# Patient Record
Sex: Female | Born: 1960
Health system: Southern US, Community
[De-identification: ages and names within clinical notes are randomized; demographics above are authoritative.]

## PROBLEM LIST (undated history)

## (undated) VITALS — BP 122/55 | HR 93 | Temp 97.8°F | Resp 16 | Ht 59.0 in | Wt 121.0 lb

## (undated) VITALS — BP 128/85 | HR 78 | Temp 97.6°F | Resp 18 | Ht 60.0 in | Wt 127.0 lb

## (undated) DIAGNOSIS — F1910 Other psychoactive substance abuse, uncomplicated: Secondary | ICD-10-CM

## (undated) DIAGNOSIS — F32A Depression, unspecified: Secondary | ICD-10-CM

## (undated) DIAGNOSIS — R519 Headache, unspecified: Secondary | ICD-10-CM

## (undated) DIAGNOSIS — R51 Headache: Secondary | ICD-10-CM

## (undated) DIAGNOSIS — F319 Bipolar disorder, unspecified: Secondary | ICD-10-CM

## (undated) DIAGNOSIS — F419 Anxiety disorder, unspecified: Secondary | ICD-10-CM

## (undated) DIAGNOSIS — F329 Major depressive disorder, single episode, unspecified: Secondary | ICD-10-CM

## (undated) DIAGNOSIS — F209 Schizophrenia, unspecified: Secondary | ICD-10-CM

## (undated) HISTORY — PX: NO PAST SURGERIES: SHX2092

---

## 2005-07-03 ENCOUNTER — Emergency Department (HOSPITAL_COMMUNITY): Admission: EM | Admit: 2005-07-03 | Discharge: 2005-07-03 | Payer: Self-pay | Admitting: Emergency Medicine

## 2006-01-17 ENCOUNTER — Emergency Department (HOSPITAL_COMMUNITY): Admission: EM | Admit: 2006-01-17 | Discharge: 2006-01-17 | Payer: Self-pay | Admitting: Emergency Medicine

## 2006-04-23 ENCOUNTER — Emergency Department (HOSPITAL_COMMUNITY): Admission: EM | Admit: 2006-04-23 | Discharge: 2006-04-23 | Payer: Self-pay | Admitting: Emergency Medicine

## 2006-07-13 ENCOUNTER — Inpatient Hospital Stay (HOSPITAL_COMMUNITY): Admission: AD | Admit: 2006-07-13 | Discharge: 2006-07-23 | Payer: Self-pay | Admitting: Psychiatry

## 2006-07-14 ENCOUNTER — Ambulatory Visit: Payer: Self-pay | Admitting: Psychiatry

## 2006-07-15 ENCOUNTER — Emergency Department (HOSPITAL_COMMUNITY): Admission: EM | Admit: 2006-07-15 | Discharge: 2006-07-15 | Payer: Self-pay | Admitting: Emergency Medicine

## 2006-07-25 ENCOUNTER — Emergency Department (HOSPITAL_COMMUNITY): Admission: EM | Admit: 2006-07-25 | Discharge: 2006-07-26 | Payer: Self-pay | Admitting: Emergency Medicine

## 2006-07-26 ENCOUNTER — Inpatient Hospital Stay (HOSPITAL_COMMUNITY): Admission: EM | Admit: 2006-07-26 | Discharge: 2006-07-29 | Payer: Self-pay | Admitting: *Deleted

## 2006-09-14 ENCOUNTER — Encounter: Payer: Self-pay | Admitting: Emergency Medicine

## 2006-09-14 ENCOUNTER — Ambulatory Visit: Payer: Self-pay | Admitting: Psychiatry

## 2006-09-14 ENCOUNTER — Inpatient Hospital Stay (HOSPITAL_COMMUNITY): Admission: EM | Admit: 2006-09-14 | Discharge: 2006-09-19 | Payer: Self-pay | Admitting: Psychiatry

## 2006-09-24 ENCOUNTER — Emergency Department (HOSPITAL_COMMUNITY): Admission: EM | Admit: 2006-09-24 | Discharge: 2006-09-24 | Payer: Self-pay | Admitting: Emergency Medicine

## 2006-12-10 ENCOUNTER — Ambulatory Visit: Payer: Self-pay | Admitting: Psychiatry

## 2006-12-10 ENCOUNTER — Inpatient Hospital Stay (HOSPITAL_COMMUNITY): Admission: AD | Admit: 2006-12-10 | Discharge: 2006-12-14 | Payer: Self-pay | Admitting: Psychiatry

## 2007-01-11 ENCOUNTER — Inpatient Hospital Stay (HOSPITAL_COMMUNITY): Admission: AD | Admit: 2007-01-11 | Discharge: 2007-01-17 | Payer: Self-pay | Admitting: Psychiatry

## 2007-03-16 ENCOUNTER — Emergency Department (HOSPITAL_COMMUNITY): Admission: EM | Admit: 2007-03-16 | Discharge: 2007-03-17 | Payer: Self-pay | Admitting: Emergency Medicine

## 2007-03-16 ENCOUNTER — Emergency Department (HOSPITAL_COMMUNITY): Admission: EM | Admit: 2007-03-16 | Discharge: 2007-03-16 | Payer: Self-pay | Admitting: Emergency Medicine

## 2007-05-10 ENCOUNTER — Inpatient Hospital Stay (HOSPITAL_COMMUNITY): Admission: AD | Admit: 2007-05-10 | Discharge: 2007-05-17 | Payer: Self-pay | Admitting: Psychiatry

## 2007-05-10 ENCOUNTER — Ambulatory Visit: Payer: Self-pay | Admitting: Psychiatry

## 2007-11-12 ENCOUNTER — Inpatient Hospital Stay (HOSPITAL_COMMUNITY): Admission: RE | Admit: 2007-11-12 | Discharge: 2007-11-16 | Payer: Self-pay | Admitting: Psychiatry

## 2007-11-12 ENCOUNTER — Ambulatory Visit: Payer: Self-pay | Admitting: Psychiatry

## 2008-02-27 ENCOUNTER — Emergency Department (HOSPITAL_COMMUNITY): Admission: EM | Admit: 2008-02-27 | Discharge: 2008-02-28 | Payer: Self-pay | Admitting: Emergency Medicine

## 2008-07-08 ENCOUNTER — Ambulatory Visit: Payer: Self-pay | Admitting: Psychiatry

## 2008-07-08 ENCOUNTER — Inpatient Hospital Stay (HOSPITAL_COMMUNITY): Admission: RE | Admit: 2008-07-08 | Discharge: 2008-07-15 | Payer: Self-pay | Admitting: Psychiatry

## 2008-07-10 ENCOUNTER — Ambulatory Visit: Payer: Self-pay | Admitting: Vascular Surgery

## 2008-07-10 ENCOUNTER — Encounter (HOSPITAL_COMMUNITY): Payer: Self-pay | Admitting: Psychiatry

## 2008-09-07 ENCOUNTER — Inpatient Hospital Stay (HOSPITAL_COMMUNITY): Admission: AD | Admit: 2008-09-07 | Discharge: 2008-09-10 | Payer: Self-pay | Admitting: Psychiatry

## 2008-09-07 ENCOUNTER — Ambulatory Visit: Payer: Self-pay | Admitting: Psychiatry

## 2008-12-06 ENCOUNTER — Other Ambulatory Visit: Payer: Self-pay | Admitting: Emergency Medicine

## 2008-12-06 ENCOUNTER — Inpatient Hospital Stay (HOSPITAL_COMMUNITY): Admission: AD | Admit: 2008-12-06 | Discharge: 2008-12-14 | Payer: Self-pay | Admitting: *Deleted

## 2008-12-09 ENCOUNTER — Ambulatory Visit: Payer: Self-pay | Admitting: *Deleted

## 2009-01-05 ENCOUNTER — Other Ambulatory Visit: Payer: Self-pay | Admitting: Emergency Medicine

## 2009-01-05 ENCOUNTER — Other Ambulatory Visit: Payer: Self-pay

## 2009-01-06 ENCOUNTER — Inpatient Hospital Stay (HOSPITAL_COMMUNITY): Admission: EM | Admit: 2009-01-06 | Discharge: 2009-01-14 | Payer: Self-pay | Admitting: Psychiatry

## 2009-05-22 ENCOUNTER — Emergency Department (HOSPITAL_COMMUNITY): Admission: EM | Admit: 2009-05-22 | Discharge: 2009-05-22 | Payer: Self-pay | Admitting: Emergency Medicine

## 2009-05-25 ENCOUNTER — Inpatient Hospital Stay (HOSPITAL_COMMUNITY): Admission: RE | Admit: 2009-05-25 | Discharge: 2009-05-28 | Payer: Self-pay | Admitting: *Deleted

## 2009-05-25 ENCOUNTER — Ambulatory Visit: Payer: Self-pay | Admitting: *Deleted

## 2011-04-03 LAB — COMPREHENSIVE METABOLIC PANEL
ALT: 17 U/L (ref 0–35)
AST: 14 U/L (ref 0–37)
CO2: 28 mEq/L (ref 19–32)
Calcium: 8.8 mg/dL (ref 8.4–10.5)
GFR calc Af Amer: >60 mL/min (ref >60)
Potassium: 3.9 mEq/L (ref 3.5–5.1)
Sodium: 142 mEq/L (ref 135–145)
Total Protein: 5.5 g/dL — ABNORMAL LOW (ref 6.0–8.3)

## 2011-04-03 LAB — RPR: RPR Ser Ql: NONREACTIVE

## 2011-04-04 LAB — TRICYCLICS SCREEN, URINE: TCA Scrn: NOT DETECTED

## 2011-04-04 LAB — RAPID URINE DRUG SCREEN, HOSP PERFORMED
Benzodiazepines: NOT DETECTED
Cocaine: POSITIVE — AB
Tetrahydrocannabinol: POSITIVE — AB

## 2011-04-04 LAB — CBC
MCHC: 32.5 g/dL (ref 30.0–36.0)
RDW: 15.5 % (ref 11.5–15.5)

## 2011-04-04 LAB — BASIC METABOLIC PANEL
BUN: 6 mg/dL (ref 6–23)
CO2: 29 mEq/L (ref 19–32)
Calcium: 8.9 mg/dL (ref 8.4–10.5)
Creatinine, Ser: 0.9 mg/dL (ref 0.4–1.2)
Glucose, Bld: 84 mg/dL (ref 70–99)

## 2011-04-04 LAB — DIFFERENTIAL
Basophils Absolute: 0 10*3/uL (ref 0.0–0.1)
Basophils Relative: 1 % (ref 0–1)
Monocytes Absolute: 0.3 10*3/uL (ref 0.1–1.0)
Neutro Abs: 2.6 10*3/uL (ref 1.7–7.7)
Neutrophils Relative %: 57 % (ref 43–77)

## 2011-04-10 LAB — URINE CULTURE: Colony Count: >=100000

## 2011-04-10 LAB — RAPID URINE DRUG SCREEN, HOSP PERFORMED
Amphetamines: NOT DETECTED
Barbiturates: NOT DETECTED
Benzodiazepines: NOT DETECTED
Cocaine: POSITIVE — AB
Opiates: NOT DETECTED
Tetrahydrocannabinol: NOT DETECTED

## 2011-04-10 LAB — CBC
HCT: 44.8 % (ref 36.0–46.0)
Hemoglobin: 14.8 g/dL (ref 12.0–15.0)
MCHC: 33.1 g/dL (ref 30.0–36.0)
MCV: 92 fL (ref 78.0–100.0)
Platelets: 245 10*3/uL (ref 150–400)
RBC: 4.87 MIL/uL (ref 3.87–5.11)
RDW: 15.4 % (ref 11.5–15.5)
WBC: 8.5 10*3/uL (ref 4.0–10.5)

## 2011-04-10 LAB — DIFFERENTIAL
Basophils Absolute: 0 10*3/uL (ref 0.0–0.1)
Basophils Relative: 0 % (ref 0–1)
Eosinophils Absolute: 0 10*3/uL (ref 0.0–0.7)
Eosinophils Relative: 0 % (ref 0–5)
Lymphocytes Relative: 25 % (ref 12–46)
Lymphs Abs: 2.1 10*3/uL (ref 0.7–4.0)
Monocytes Absolute: 0.4 10*3/uL (ref 0.1–1.0)
Neutro Abs: 5.9 10*3/uL (ref 1.7–7.7)
Neutrophils Relative %: 70 % (ref 43–77)

## 2011-04-10 LAB — COMPREHENSIVE METABOLIC PANEL
ALT: 20 U/L (ref 0–35)
AST: 30 U/L (ref 0–37)
Albumin: 4.3 g/dL (ref 3.5–5.2)
Alkaline Phosphatase: 53 U/L (ref 39–117)
BUN: 12 mg/dL (ref 6–23)
CO2: 24 mEq/L (ref 19–32)
Calcium: 9 mg/dL (ref 8.4–10.5)
Chloride: 104 mEq/L (ref 96–112)
Creatinine, Ser: 0.88 mg/dL (ref 0.4–1.2)
GFR calc Af Amer: >60 mL/min (ref >60)
GFR calc non Af Amer: >60 mL/min (ref >60)
Glucose, Bld: 88 mg/dL (ref 70–99)
Potassium: 3.7 mEq/L (ref 3.5–5.1)
Sodium: 136 mEq/L (ref 135–145)
Total Bilirubin: 1 mg/dL (ref 0.3–1.2)
Total Protein: 7.6 g/dL (ref 6.0–8.3)

## 2011-04-10 LAB — URINALYSIS, ROUTINE W REFLEX MICROSCOPIC
Bilirubin Urine: NEGATIVE
Glucose, UA: NEGATIVE mg/dL
Ketones, ur: 40 mg/dL — AB
Leukocytes, UA: NEGATIVE
Nitrite: POSITIVE — AB
Protein, ur: 30 mg/dL — AB
Specific Gravity, Urine: 1.019 (ref 1.005–1.030)
Urobilinogen, UA: 0.2 mg/dL (ref 0.0–1.0)
pH: 6 (ref 5.0–8.0)

## 2011-04-10 LAB — SALICYLATE LEVEL: Salicylate Lvl: <4 mg/dL (ref 2.8–20.0)

## 2011-04-10 LAB — URINE MICROSCOPIC-ADD ON

## 2011-04-10 LAB — TRICYCLICS SCREEN, URINE: TCA Scrn: NOT DETECTED

## 2011-04-10 LAB — POCT PREGNANCY, URINE: Preg Test, Ur: NEGATIVE
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
## 2011-05-09 NOTE — H&P (Signed)
[name], [name]            ACCOUNT NO.:  [id number]   MEDICAL RECORD NO.:  [id number]          PATIENT TYPE:  IPS   LOCATION:  0300                          FACILITY:  [facility]   PHYSICIAN:  Jasmine Pang, M.D. DATE OF BIRTH:  12-05-1961   DATE OF ADMISSION:  12/06/2008  DATE OF DISCHARGE:                       PSYCHIATRIC ADMISSION ASSESSMENT   PATIENT IDENTIFICATION:  This is a 50 year old female voluntarily  admitted December 06, 2008.   HISTORY OF PRESENT ILLNESS:  The patient reports auditory hallucinations  telling her to kill herself, having a plan to overdose.  She has been  noncompliant with her medications and follow-up.  She states the Crisis  Team had come out to her home, assessed her situation and felt she  should be assessed for possible hospitalization.  She has been drinking  alcohol on occasion and using cocaine, has had poor sleep and has lost  some weight.   PAST PSYCHIATRIC HISTORY:  The patient was here in September.  Has a  history bipolar disorder type 2.  I a client at [facility] mental  health.   SOCIAL HISTORY:  This is a 50 year old female who considers herself  homeless, has been living in [place] for 3 years.  Moved from [place].   FAMILY HISTORY:  Aunt who died in a psychiatric facility and a daughter  who is currently bipolar.   ALCOHOL AND DRUG HABITS:  Has been drinking and using cocaine.   PRIMARY CARE Williams Dietrick:  None.   MEDICAL PROBLEMS:  Denies any acute or chronic health issues.  Denies  any surgical history.   MEDICATIONS:  Has been on Seroquel 100 at bedtime and Depakote 500  b.i.d.   DRUG ALLERGIES:  No known allergies.   PHYSICAL EXAMINATION:  GENERAL:  This is a short statured middle-aged  female who was fully assessed at [facility] emergency department.  Physical exam was reviewed with no significant findings.  VITAL SIGNS:  Temperature 97, 971 heart rate 20 respirations, blood  pressure is 110/66, 5 feet  4 inches tall, 118 pounds.   LABORATORY DATA:  Urine drug screen positive for cocaine.  CBC within  normal limits.  Alcohol level less than five, glucose of 122.   MENTAL STATUS EXAM:  She is in the bed, cooperative, although she seems  somewhat restless, rolling back and forth.  Feels that she needs her  medication to feel better again.  Her speech is clear, polite.  Mood is  anxious.  The patient again is restless, but cooperative and does not  appear to be actively responding to internal stimuli.  Thought process.  She is endorsing auditory hallucinations.  Promises safety.  Denies any  current suicidal or homicidal thoughts.  No delusional statements.  Her  answers are coherent and goal directed.  Cognitive function intact.  Her  memory appears to be good.  Judgment is fair.   DIAGNOSES:  AXIS I:  His mood disorder, polysubstance abuse.  AXIS II:  Deferred.  AXIS III:  No known medical conditions.  AXIS IV:  Some problems with housing.  Psychosocial problems related to  burden  of illness and chronic substance use.  AXIS V:  Current is 30.   PLAN:  Contract for safety.  Will have Librium available on a p.r.n.  basis.  Will resume her Seroquel and Depakote.  Reinforce medication  compliance and follow-up visits.  Will continue to identify her support  group.  Case manager will assess her living situation and follow-up.  Her tentative length of stay at this time is 3-5 days.      Landry Corporal, N.P.      Jasmine Pang, M.D.  Electronically Signed    JO/MEDQ  D:  12/07/2008  T:  12/07/2008  Job:  [id number]
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
## 2011-05-09 NOTE — Consult Note (Signed)
[name], BEG NO.:  [id number]   MEDICAL RECORD NO.:  [id number]          PATIENT TYPE:  EMS   LOCATION:  MAJO                         FACILITY:  [facility]   PHYSICIAN:  Antonietta Breach, M.D.  DATE OF BIRTH:  [birth year]   DATE OF CONSULTATION:  05/24/2009  DATE OF DISCHARGE:  05/25/2009                                 CONSULTATION   REASON FOR CONSULTATION:  Psychosis.   REQUESTING PHYSICIAN:  Lobbyist.   HISTORY OF PRESENT ILLNESS:  [name] is a 50 year old female  presenting to the [facility] on May 22, 2009, with approximately 1  week of inappropriate laughter and command self-destructive auditory  hallucinations.  She is showing labile mood.  She does have intact  orientation and memory function.   Her judgment is impaired and her insight is poor.   She has been having suicidal ideation.  She did stop her medications  when she ran out approximately 3 months ago.   Her daughter prompted her to come to the [facility] Emergency Room due to her  suicidal thoughts.   PAST PSYCHIATRIC HISTORY:  In review of the past medical record, she was  admitted to the [facility] in January of this year.  She was discharged on January 19 with a diagnosis of schizoaffective  disorder.   DISCHARGE MEDICATIONS:  Seroquel 100 mg q.a.m., 200 mg at bedtime,  Depakote 1000 mg at bedtime.   FAMILY PSYCHIATRIC HISTORY:  None known.   SOCIAL HISTORY:  She is medically disabled.  She has a history of using  cocaine and used approximately 1 week ago.  She reportedly does not use  alcohol.  She has been living at home with her daughter.  She is single.  Religion, [employer].   PAST MEDICAL HISTORY:  Usual childhood illnesses.   ALLERGIES:  No known drug allergies.   REVIEW OF SYSTEMS:  Constitutional, Head, eyes, ears, nose and throat,  mouth, neurologic, psychiatric, cardiovascular, respiratory,  gastrointestinal,  genitourinary, skin, musculoskeletal, hematologic,  lymphatic, endocrine, metabolic all unremarkable.   PHYSICAL EXAMINATION:  VITAL SIGNS:  Temperature 98.5, pulse 74,  respiratory rate 18, blood pressure 103/76.  GENERAL APPEARANCE:  [name] is a middle-aged female, lying in a  supine position in her hospital bed with no abnormal involuntary  movements.   MENTAL STATUS EXAMINATION:  [name] is alert.  Her eye contact is  intermittent.  Her affect is labile with inappropriate laughter.  Her  mood involves inappropriate laughter.  Her concentration is mildly  decreased.  She is oriented to all spheres.  Her memory is intact to  immediate recent and remote.  Her fund of knowledge and intelligence are  grossly within normal limits.  Her speech is mildly pressured at times.  Thought process is coherent.  Thought content is in the history of  present illness.  Insight is poor.  Judgment is impaired.   ASSESSMENT:  Axis I:  293.82, psychotic disorder, not otherwise  specified with hallucinations.   Rule out 295.70, schizoaffective disorder.  She does have bipolar  disorder listed in the general medical  physicians' problem list.  However, schizoaffective disorder was listed in the dictation from the  [facility] discharge in January of this year.   Axis II:  Deferred.  Axis III:  See past medical history.  Axis IV:  Primary support group.  Axis V:  20.   RECOMMENDATIONS:  1. Because Ms. Tan is at risk for suicide and she has impaired      judgment, we would admit her to an inpatient psychiatric unit for      further evaluation and treatment.  2. Would proceed with Seroquel 100 mg q.a.m. and 200 mg at bedtime for      antipsychosis and acute mood stabilization.  3. Would also proceed with Depakote 1000 mg daily as her primary mood      stabilizer.  4. Would check a CBC and liver function panel during the first week to      screen for Depakote adverse effects.   5. Would monitor for stiffness or other extrapyramidal side effects of      her Seroquel.  6. Low stimulation, ego support.      Antonietta Breach, M.D.  Electronically Signed     JW/MEDQ  D:  07/04/2009  T:  07/05/2009  Job:  [id number]
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
## 2011-05-12 NOTE — Discharge Summary (Signed)
NAMERACHANA, MALESKY            ACCOUNT NO.:  [id number]   MEDICAL RECORD NO.:  [id number]          PATIENT TYPE:  IPS   LOCATION:  0504                          FACILITY:  [facility]   PHYSICIAN:  Anselm Jungling, MD  DATE OF BIRTH:  Sep 07, 1961   DATE OF ADMISSION:  09/14/2006  DATE OF DISCHARGE:  09/19/2006                                 DISCHARGE SUMMARY   IDENTIFYING DATA AND REASON FOR ADMISSION:  The patient is a 50 year old  single, African American female, who was admitted on a voluntary basis.  She  presented with a history of depression and suicidal thoughts, but with no  specific plan.  She also admitted to homicidal ideation towards her  boyfriend.  This was her second [facility] admission, her first having been 2  months ago for a combination of depression, suicide risk, hallucinations and  cocaine use.  She had recently relapsed on crack cocaine.  Please refer to  the admission note for further details pertaining to the symptoms,  circumstances and history that led to her hospitalization.  She was given  initial Axis I diagnosis of bipolar disorder NOS, cocaine abuse, and cocaine  dependence.   MEDICAL/LABORATORY:  The patient was medically and physically assessed by  the psychiatric nurse practitioner.  She was in generally good health  without any active or chronic medical problems.  There were no significant  medical issues.   HOSPITAL COURSE:  The patient was admitted to the Adult Inpatient  Psychiatric Service.  She presented as a well-nourished, well-developed  [place] female who was pleasant and cooperative.  She was treated  with a continued regimen of Depakote ER 500 mg twice daily, Seroquel 50 mg  twice daily and 100 mg at bedtime, Ambien 10 mg as needed at bedtime for  insomnia, and Symmetrel 100 mg twice daily for cocaine craving.  These were  well tolerated.   The patient participated in various therapeutic groups and activities  including groups  hosted by Alcoholics Anonymous and Narcotics Anonymous  throughout the week.   On hospital day #6, the patient appeared appropriate for discharge and  agreed upon a discharge plan involving specific referrals to outpatient  services.   AFTERCARE:  The patient was to follow-up at [facility] on September 21, 2006, and for medication management with Dr. Allyne Gee, [facility], on September 20, 2006, the day following discharge.   DISCHARGE MEDICATIONS:  1. Depakote ER 500 mg b.i.d.  2. Seroquel 50 mg b.i.d. and 100 mg q.h.s.  3. Symmetrel 100 mg b.i.d.  4. Ambien 10 mg h.s. p.r.n. insomnia.   DISCHARGE DIAGNOSES:  AXIS I:       Bipolar disorder NOS, and history of  cocaine dependence.  AXIS II:    Deferred.  AXIS III:   No acute or chronic illnesses.  AXIS IV:    Stressors severe.  AXIS V:     GAF on discharge 60.      Anselm Jungling, MD  Electronically Signed     SPB/MEDQ  D:  09/20/2006  T:  09/22/2006  Job:  [id number] 
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
## 2011-05-12 NOTE — Discharge Summary (Signed)
[name], GUDGEL            ACCOUNT NO.:  [id number]   MEDICAL RECORD NO.:  [id number]          PATIENT TYPE:  IPS   LOCATION:  0402                          FACILITY:  [facility]   PHYSICIAN:  Anselm Jungling, MD  DATE OF BIRTH:  December 01, 1961   DATE OF ADMISSION:  07/13/2006  DATE OF DISCHARGE:  07/23/2006                                 DISCHARGE SUMMARY   IDENTIFYING DATA/REASON FOR ADMISSION:  The patient is a 50 year old single  African American female who was admitted due to cocaine abuse within the  context of a history of psychosis.  She had come to [place] with a previous  history of treatment with Seroquel, Depakote, and Prozac, but had been  taking none of these medications recently.  At the time of admission, she  was reporting auditory hallucinations telling her to kill herself.  Please  refer to the admission note for further details pertaining to the symptoms,  circumstances and history that led to her hospitalization.   INITIAL DIAGNOSTIC IMPRESSION:  She was given an initial AXIS I diagnosis of  schizoaffective disorder not otherwise specified, and cocaine abuse, and  rule out substance-induced psychosis.   MEDICAL/LABORATORY:  The patient was medically and physically assessed by  the psychiatric nurse practitioner.  She did have a tooth abscess which was  reviewed in the emergency department.  She was treated with amoxicillin and  Vicodin for this.  It appeared to resolve uneventfully.  Aside from this,  there were no significant medical issues.   HOSPITAL COURSE:  The patient was admitted to the adult inpatient  psychiatric service.  She presented as a thin, restless, irritable woman who  acknowledged that she was feeling quite irritable and requested help in the  form of restarting of previous medications for this.  Her thoughts and  speech were normally organized.  She acknowledged auditory hallucinations  but was not delusional.   She was restarted on Seroquel  and Depakote.  She was also ordered Symmetrel  100 mg b.i.d. to address cocaine cravings.   The patient showed gradual stabilization of mood and affect over the next  several days.  Her sleep stabilized, and she became progressively less  irritable.  Her participation in the unit improved and, as discharge  approached, she was able to focus more on post-discharge issues and address  these with the casemanager.   On the 11th hospital day, she was felt to be appropriate for discharge.   AFTERCARE:  The patient was to follow-up with Dr. Danella Sensing office on August 08, 2006 and she was also to return to the [facility].  It was recommended she involve herself in Narcotics Anonymous.   DISCHARGE MEDICATIONS:  1.  Symmetrel 100 mg b.i.d.  2.  Depakote ER 500 mg q.a.m. and 750 mg q.h.s.  3.  Seroquel 200 mg q.h.s.   A Depakote level was pending at the time of this dictation.   DISCHARGE DIAGNOSES:  AXIS I:  Schizoaffective disorder not otherwise  specified.  History of cocaine abuse.  AXIS II:  Deferred.  AXIS III:  Status post dental  abscess.  AXIS IV:  Stressors:  Severe.  AXIS V:  GAF on discharge 60.           ______________________________  Anselm Jungling, MD  Electronically Signed     SPB/MEDQ  D:  07/24/2006  T:  07/24/2006  Job:  [id number]
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
## 2011-05-12 NOTE — Discharge Summary (Signed)
NAMEJAKERA, [name]            ACCOUNT NO.:  [id number]   MEDICAL RECORD NO.:  [id number]          PATIENT TYPE:  IPS   LOCATION:  0405                          FACILITY:  [facility]   PHYSICIAN:  Jasmine Pang, M.D. DATE OF BIRTH:  03-27-1961   DATE OF ADMISSION:  07/26/2006  DATE OF DISCHARGE:  07/29/2006                                 DISCHARGE SUMMARY   IDENTIFICATION:  The patient is a 50 year old African-American female  admitted on a voluntary basis on July 26, 2006.   HISTORY OF PRESENT ILLNESS:  The patient has a history of bipolar disorder,  type 1, and was discharged on Monday, July 23, 2006.  She went out into the  streets immediately relapsing on cocaine.  She has been using this daily.  She has taken no medications since she left.  She has been exchanging sex  for drugs and money.  She has been thinking of harming herself, possibly  overdose or to start cutting on herself.  She has had no sleep in three  days.  She has also got command auditory hallucinations to not take her  medications.  Also drinking alcohol.  She is an unreliable historian.  She  has flight of ideas.  The patient states she has had bipolar disorder since  age 40 when she first tried to kill herself.  She has had a history of  multiple hospitalizations.  She has had a history of cutting herself.  See  psychiatric admission assessment for further information.   PHYSICAL EXAMINATION:  Physical examination was done in the ED.  She was  found to be in no acute physical distress.   LABORATORY DATA:  Basic metabolic panel with sodium 137, potassium 3.8,  chloride 103, CO2 29, BUN 20, creatinine 0.9, glucose slightly elevated at  103.  TSH 0.859 on July 13, 2006.  Alcohol level less than 5.  Urine  pregnancy test negative.  UDS positive for cocaine.  The hepatitis acute  panel was negative for any form of hepatitis.  HIV was nonreactive.  RPR was  nonreactive.   HOSPITAL COURSE:  Upon admission, the  patient was continued on Symmetrel 100  mg p.o. b.i.d., Depakote ER 500 mg p.o. q.d., Depakote ER 750 mg q.h.s.,  Seroquel 200 mg p.o. q.h.s.  On July 26, 2006, the patient was given  Seroquel 200 mg now, then 100 mg q.6h. for agitation or hallucinations.  Due  to possible alcohol withdrawal, she was started on Librium 25 mg x1 now,  then 25 mg p.r.n. withdrawal symptoms x48 hours only.  The patient tolerated  these medications well with no significant side effects.  Upon first meeting  the patient, she was lying in bed.  She is friendly and cooperative.  She  admitted to using as soon as she left the hospital from her last admission a  week ago.  She also admits to prostituting for drugs.  On July 28, 2006,  the patient states it's coming together, I'm good.  Her affect was  brighter.  No impulsive movements or speech.  Speech was fluent with normal  pace  and tone.  Thoughts were organized.  No suicidal or homicidal ideation.  Affect was bright.  On July 29, 2006, affect remained bright and  appropriate.  Mood euthymic.  She was stable and helping the other patients  on the unit.  She is requesting to be discharged to stay with a friend and  then [facility].  She contracts to remain off  drugs and contracts for safety.  She contracts to take her medications as  prescribed.  It was felt she was safe to be discharged.  She was having no  auditory or visual hallucinations.  These had resolved fairly quickly in the  hospital.   DISCHARGE DIAGNOSES:  AXIS I:  Bipolar disorder, type 1, manic with  psychosis.  Cocaine dependence.  AXIS II:  No diagnosis.  AXIS III:  No diagnosis.  AXIS IV:  Severe (homeless, prostituting for drugs).  AXIS V:  GAF upon discharge 40; GAF upon admission 25; GAF highest past year  24.   ACTIVITY/DIET:  There were no specific activity level or dietary  restrictions.   DISCHARGE MEDICATIONS:  1. Depakote ER 500 mg q.a.m. and 750  mg at bedtime.  2. Seroquel 200 mg at bedtime.  3. Symmetrel 100 mg p.o. b.i.d.   POST-HOSPITAL CARE PLANS:  The patient will [facility]  starting Tuesday, August 02, 2006 at 1 p.m.  She will also be seen at the  [facility] on Wednesday, August 08, 2006 at 4 p.m.      Jasmine Pang, M.D.  Electronically Signed     BHS/MEDQ  D:  08/21/2006  T:  08/21/2006  Job:  [id number]
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
## 2011-05-12 NOTE — H&P (Signed)
[name], [name]            ACCOUNT NO.:  [id number]   MEDICAL RECORD NO.:  [id number]          PATIENT TYPE:  IPS   LOCATION:  0402                          FACILITY:  [facility]   PHYSICIAN:  Anselm Jungling, MD  DATE OF BIRTH:  [birth year]   DATE OF ADMISSION:  07/13/2006  DATE OF DISCHARGE:                         PSYCHIATRIC ADMISSION ASSESSMENT   IDENTIFYING INFORMATION:  This is a 50 year old single African-American  female. She apparently presented to [facility] yesterday  requesting assistance with her cocaine abuse. She reports a history for  schizoaffective disorder. She has been noncompliant with her medications for  approximately a year. In the past, she was on Prozac, Seroquel and Depakote.  She was transferred over here to the [facility]. She is  currently quite sleepy. She refuses to get up and be examined, and so I am  doing this mostly from the chart.   PAST PSYCHIATRIC HISTORY:  She reported to the intake people that she has  had prior inpatient stays in [place].   SOCIAL HISTORY:  She states that she graduated high school in 1982. She  never married. She has a 9 year old daughter. She has a 39-year-old son who  is living in [place] with his grandmother. She has been here 18 months, and  she gets disability.   FAMILY HISTORY:  She had told the intake people that her aunt has psychosis.   PAST MEDICAL HISTORY:  She acknowledges using crack and alcohol. She has no  known medical problems. She has been noncompliant with medication for a  year.   ALLERGIES:  She has no known drug allergies.   PHYSICAL EXAMINATION:  VITAL SIGNS:  On admission, her vital signs showed  she was 59.75 inches tall. Her weight was 101 pounds. Her temperature was  98.3. Blood pressure was 120/100. Her pulse was 60. Respirations were 20.   LABORATORY DATA:  Her labs that are available shows that her CBC was within  normal limits. Her glucose was  slightly elevated at 105. Her thyroid was  also normal at 0.859. She has not given them a urine drug screen although we  would anticipate it being positive for cocaine   MENTAL STATUS EXAMINATION:  She is groggy. She is in the bed. She refuses to  wake up for me. However, on admission yesterday, she apparently came in,  telling them that she was having command hallucinations, and that is why she  came in. Also, her boyfriend was recently incarcerated, and she was having  thoughts to hurt herself either by overdosing, etc. Her speech on admission  was normal. Her level of consciousness on admission was normal. Her mood was  tearful, depressed and anxious. Her affect was appropriate. Her anxiety  level was moderate. Her thought processes were thought to be coherent. Her  judgment was thought to be fair. Her concentration was decreased, but her  memory was intact. Her IQ was felt to be average, and apparently, she  reported decrease sleep recently due to be afraid to sleep.   Dr. Electa Sniff had seen the patient earlier, and his impression was  schizoaffective  disorder, cocaine abuse, rule out substance-induced  psychosis. Restart her Depakote and Symmetrel for cocaine cravings and to  increase her database. Also to explore available support.   AXIS I:  Schizoaffective disorder, noncompliant with medications. Cocaine  abuse and probable substance-induced psychosis.  AXIS II:  No diagnosis.  AXIS III:  No known medical illnesses.  AXIS IV:  Severe.  AXIS V:  30.   PLAN:  The plan is to admit for detox, to restart her medications, to  continue her command hallucinations and to increase the database.      Mickie Leonarda Salon, P.A.-C.      Anselm Jungling, MD  Electronically Signed    MD/MEDQ  D:  07/14/2006  T:  07/14/2006  Job:  [id number]
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
## 2011-05-12 NOTE — Discharge Summary (Signed)
[name], [name]            ACCOUNT NO.:  [id number]   MEDICAL RECORD NO.:  [id number]          PATIENT TYPE:  IPS   LOCATION:  0305                          FACILITY:  [facility]   PHYSICIAN:  [name], M.D.      DATE OF BIRTH:  06-Jan-1961   DATE OF ADMISSION:  07/08/2008  DATE OF DISCHARGE:  07/15/2008                               DISCHARGE SUMMARY   CHIEF COMPLAINT/HISTORY OF PRESENT ILLNESS:  This was one of several  admissions to [facility] for this 50 year old African  American female, voluntarily admitted.  Presents with thoughts of  possibly overdosing or killing herself with cocaine.  Hopeless about  future since relapse several months ago after having several months of  abstinence.  Living in a halfway house.  At this particular time she is  homeless.  Has been living with her daughter in [place] but cannot  stay there.  Using cocaine daily.  Occasional alcohol and rare  marijuana.   PAST PSYCHIATRIC HISTORY:  Usually being seen at [facility].  Was  admitted to [facility] in November 2008.  Has a history of 4  months of abstinence in 2008 after being in a halfway house.  History of  a mood disorder, on Seroquel and Depakote.   ALCOHOL AND DRUG HISTORY:  As already stated, persistent use of cocaine.   MEDICAL HISTORY:  Noncontributory.   MEDICATIONS:  Previously on Depakote, Symmetrel and Seroquel.   PHYSICAL EXAMINATION:  Failed to show any acute findings.   LABORATORY WORK:  CBC:  White blood cells 7.8, hemoglobin 12.8, sodium  142, potassium 3.3, glucose 86, BUN 11, creatinine 1.03, SGOT 17, SGPT  16.  UDS positive for cocaine.   MENTAL STATUS EXAM:  Reveals an alert, cooperative female in bed.  Mood  depressed, irritable.  Affect depressed.  Thought processes logical,  coherent and relevant.  Not as spontaneous, a degree of psychomotor  retardation.  Feeling hopeless, helpless, overwhelmed.  Suicidal  ideation with plan to overdose.   No homicidal ideas.  No delusions.  No  hallucinations.  Cognition well preserved.   ADMISSION DIAGNOSES:  AXIS I:  Cocaine dependence.  Mood disorder NOS.  AXIS II:  No diagnosis.  AXIS III:  No diagnosis.  AXIS IV:  Moderate.  AXIS V:  On admission 35, highest GAF in the last year 60.   COURSE IN THE HOSPITAL:  She was admitted, started in individual and  group psychotherapy.  We resumed the Depakote and we started the  Symmetrel.  She was already being given Seroquel and trazodone.  As  already stated, a 50 year old female who endorsed that after she left  last time she went to [place].  She was there for 90 days and said she  did well.  She left and got her own apartment, got with the wrong  people, relapsed for the last month, using on a regular basis.  Claims  that she is not taking medications.  Endorsed voices telling her to hurt  herself.  We continued to stabilize.  She wanted to be back on the  Depakote.  Endorsed she was committed to abstinence, wanting to go to a  residential treatment program.  On July 20th, she endorsed that she  would like to get her life back together, she would not benefit from  going to another 28-day program, would like to pursue outpatient  treatment, was going to go back with the daughter at least temporarily  and work on getting another place.  In the next 48 hours, she continued  to improve.  Endorsed that she was not going back with the boyfriend.  She was back taking the Depakote.  She was going to follow with the  [facility].  Felt that she was going to be successful this time  around.   DISCHARGE DIAGNOSES:  AXIS I:  Cocaine dependence.  Bipolar disorder.  Substance-induced psychosis.  AXIS II:  No diagnosis.  AXIS III:  No diagnosis.  AXIS IV:  Moderate.  AXIS V:  On discharge 50, 55.   Discharged on Seroquel 100 mg at night, Symmetrel 100 mg twice a day,  Depakote ER 500 mg 2 at bedtime.  Depakote level upon discharge  80.1.   Follow up with Dr. Lang [name] at [facility] and [facility].      [name], M.D.  Electronically Signed     IL/MEDQ  D:  08/05/2008  T:  08/05/2008  Job:  [id number]

## 2011-05-12 NOTE — Discharge Summary (Signed)
[name], RAIDER            ACCOUNT NO.:  [id number]   MEDICAL RECORD NO.:  [id number]          PATIENT TYPE:  IPS   LOCATION:  0401                          FACILITY:  BH   PHYSICIAN:  Anselm Jungling, MD  DATE OF BIRTH:  [birth year]   DATE OF ADMISSION:  01/06/2009  DATE OF DISCHARGE:  01/14/2009                               DISCHARGE SUMMARY   IDENTIFYING DATA AND REASON FOR ADMISSION:  This was one of numerous  inpatient [facility] admissions for Magdalena, a 50 year old single African  American female with a history of schizoaffective disorder and  polysubstance abuse.  She was admitted in the aftermath of a severe  alcohol and cocaine relapse, within a context of medication  noncompliance.  Please refer to the admission note for further details  pertaining to the symptoms, circumstances and history that led to her  hospitalization.  She was given an initial Axis I diagnosis of  schizoaffective disorder NOS and polysubstance abuse NOS.   MEDICAL AND LABORATORY:  The patient was medically and physically  assessed by the psychiatric nurse practitioner.  She was in good health  without any active chronic medical conditions.  She was placed on a  multivitamin with iron.  There were no significant medical issues.   HOSPITAL COURSE:  The patient was admitted to the adult inpatient  psychiatric service.  She presented as a well-nourished, normally-  developed [place] American female who initially was uninterested in  participating in any interviewing or examination.  She slept for most of  the first two days, consistent with having come off a cocaine binge.  She was agreeable to having her Depakote and Seroquel restarted.   By the fourth hospital day, the patient was feeling much better.  She  was up, dressed, active in the milieu and attending groups.  Her  thoughts and speech were normally organized, and she embraced the task  of working with case management to find an  appropriate aftercare  situation for her.   A three day holiday weekend resulted in her discharge and aftercare  plans taking longer to arrange then they would have otherwise.   On the eighth hospital day the patient was appropriate for discharge, as  a residential situation for her had been found.   AFTERCARE:  The patient was to follow up at [facility] with  an appointment on January 15, 2009, at 2:30 p.m.   DISCHARGE MEDICATIONS:  1. Depakote 1000 mg q.h.s.  2. Seroquel 100 mg q.a.m. and 200 mg q.h.s.  3. Multivitamin with iron daily.   DISCHARGE DIAGNOSES:  AXIS I:  Schizoaffective disorder NOS (not  otherwise specified), polysubstance abuse, early remission.  AXIS II:  Deferred.  AXIS III:  No acute or chronic illnesses.  AXIS IV:  Stressors severe.  AXIS V:  GAF on discharge 55.      Anselm Jungling, MD  Electronically Signed     SPB/MEDQ  D:  01/15/2009  T:  01/15/2009  Job:  [id number]
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
## 2011-09-18 LAB — URINE MICROSCOPIC-ADD ON

## 2011-09-18 LAB — RAPID URINE DRUG SCREEN, HOSP PERFORMED: Barbiturates: NOT DETECTED

## 2011-09-18 LAB — VALPROIC ACID LEVEL: Valproic Acid Lvl: <10 — ABNORMAL LOW

## 2011-09-18 LAB — CBC
MCHC: 33.9
Platelets: 215
RDW: 15.5

## 2011-09-18 LAB — DIFFERENTIAL
Basophils Absolute: 0.1
Basophils Relative: 1
Monocytes Absolute: 0.6
Neutro Abs: 4.7
Neutrophils Relative %: 60

## 2011-09-18 LAB — I-STAT 8, (EC8 V) (CONVERTED LAB)
HCT: 52 — ABNORMAL HIGH
Potassium: 3.9
Sodium: 137
TCO2: 27
pH, Ven: 7.371 — ABNORMAL HIGH

## 2011-09-18 LAB — URINALYSIS, ROUTINE W REFLEX MICROSCOPIC
Specific Gravity, Urine: 1.034 — ABNORMAL HIGH
Urobilinogen, UA: 0.2
pH: 5

## 2011-09-18 LAB — POCT PREGNANCY, URINE
Operator id: 161631
Preg Test, Ur: NEGATIVE

## 2011-09-18 LAB — POCT I-STAT CREATININE: Operator id: 295131

## 2011-09-22 LAB — URINALYSIS, ROUTINE W REFLEX MICROSCOPIC
Bilirubin Urine: NEGATIVE
Nitrite: NEGATIVE
Protein, ur: NEGATIVE
Specific Gravity, Urine: 1.012
Urobilinogen, UA: 1

## 2011-09-22 LAB — URINE DRUGS OF ABUSE SCREEN W ALC, ROUTINE (REF LAB)
Amphetamine Screen, Ur: NEGATIVE
Creatinine,U: 53.4
Ethyl Alcohol: <10
Opiate Screen, Urine: NEGATIVE
Phencyclidine (PCP): NEGATIVE
Propoxyphene: NEGATIVE

## 2011-09-22 LAB — COMPREHENSIVE METABOLIC PANEL
CO2: 29
Calcium: 8.9
Creatinine, Ser: 1.03
GFR calc non Af Amer: 58 — ABNORMAL LOW
Glucose, Bld: 86
Total Bilirubin: 0.5

## 2011-09-22 LAB — CBC
HCT: 37.8
Hemoglobin: 12.8
MCHC: 33.8
MCV: 92.5
RBC: 4.08

## 2011-09-22 LAB — COCAINE, URINE, CONFIRMATION: Benzoylecgonine GC/MS Conf: 480 ng/mL

## 2011-09-22 LAB — URINE MICROSCOPIC-ADD ON

## 2011-09-25 LAB — COCAINE, URINE, CONFIRMATION: Benzoylecgonine GC/MS Conf: 180 ng/mL

## 2011-09-25 LAB — URINE DRUGS OF ABUSE SCREEN W ALC, ROUTINE (REF LAB)
Amphetamine Screen, Ur: NEGATIVE
Barbiturate Quant, Ur: NEGATIVE
Benzodiazepines.: NEGATIVE
Cocaine Metabolites: POSITIVE — AB
Creatinine,U: 171.9
Ethyl Alcohol: <10
Marijuana Metabolite: NEGATIVE

## 2011-09-25 LAB — URINE MICROSCOPIC-ADD ON

## 2011-09-25 LAB — URINALYSIS, ROUTINE W REFLEX MICROSCOPIC
Glucose, UA: NEGATIVE
Nitrite: POSITIVE — AB
Specific Gravity, Urine: 1.026
pH: 6

## 2011-09-27 LAB — COMPREHENSIVE METABOLIC PANEL
ALT: 14
Albumin: 3.2 — ABNORMAL LOW
Alkaline Phosphatase: 52
Calcium: 9.1
Glucose, Bld: 131 — ABNORMAL HIGH
Potassium: 3.4 — ABNORMAL LOW
Sodium: 138
Total Protein: 6.2

## 2011-09-27 LAB — CBC
MCHC: 33.1
Platelets: 227
RDW: 15.1

## 2011-09-27 LAB — TSH: TSH: 0.803

## 2011-09-29 LAB — BASIC METABOLIC PANEL
BUN: 12 mg/dL (ref 6–23)
Calcium: 9 mg/dL (ref 8.4–10.5)
Chloride: 108 mEq/L (ref 96–112)
Creatinine, Ser: 1.07 mg/dL (ref 0.4–1.2)
GFR calc Af Amer: >60 mL/min (ref >60)
GFR calc non Af Amer: 55 mL/min — ABNORMAL LOW (ref >60)
Glucose, Bld: 122 mg/dL — ABNORMAL HIGH (ref 70–99)
Potassium: 3.6 mEq/L (ref 3.5–5.1)
Sodium: 140 mEq/L (ref 135–145)

## 2011-09-29 LAB — CBC
HCT: 44.7 % (ref 36.0–46.0)
Hemoglobin: 14.7 g/dL (ref 12.0–15.0)
MCHC: 32.9 g/dL (ref 30.0–36.0)
MCV: 92.2 fL (ref 78.0–100.0)
Platelets: 234 10*3/uL (ref 150–400)
RBC: 4.85 MIL/uL (ref 3.87–5.11)
RDW: 15.1 % (ref 11.5–15.5)
WBC: 6.7 10*3/uL (ref 4.0–10.5)

## 2011-09-29 LAB — RAPID URINE DRUG SCREEN, HOSP PERFORMED
Amphetamines: NOT DETECTED
Barbiturates: NOT DETECTED
Benzodiazepines: NOT DETECTED
Cocaine: POSITIVE — AB
Opiates: NOT DETECTED
Tetrahydrocannabinol: NOT DETECTED

## 2011-09-29 LAB — DIFFERENTIAL
Basophils Absolute: 0.1 10*3/uL (ref 0.0–0.1)
Basophils Relative: 2 % — ABNORMAL HIGH (ref 0–1)
Eosinophils Absolute: 0.3 10*3/uL (ref 0.0–0.7)
Eosinophils Relative: 4 % (ref 0–5)
Lymphocytes Relative: 36 % (ref 12–46)
Lymphs Abs: 2.4 10*3/uL (ref 0.7–4.0)
Monocytes Absolute: 0.6 10*3/uL (ref 0.1–1.0)
Monocytes Relative: 9 % (ref 3–12)
Neutro Abs: 3.3 10*3/uL (ref 1.7–7.7)
Neutrophils Relative %: 49 % (ref 43–77)

## 2011-09-29 LAB — ETHANOL: Alcohol, Ethyl (B): <5 mg/dL (ref 0–10)

## 2011-10-03 LAB — URINE DRUGS OF ABUSE SCREEN W ALC, ROUTINE (REF LAB)
Amphetamine Screen, Ur: NEGATIVE
Cocaine Metabolites: POSITIVE — AB
Creatinine,U: 84
Ethyl Alcohol: <5
Methadone: NEGATIVE
Opiate Screen, Urine: NEGATIVE
Phencyclidine (PCP): NEGATIVE

## 2011-10-03 LAB — COMPREHENSIVE METABOLIC PANEL
Alkaline Phosphatase: 53
BUN: 10
CO2: 26
Chloride: 108
Glucose, Bld: 104 — ABNORMAL HIGH
Potassium: 3.5
Total Bilirubin: 0.5

## 2011-10-03 LAB — URINALYSIS, ROUTINE W REFLEX MICROSCOPIC
Glucose, UA: NEGATIVE
Protein, ur: NEGATIVE
Urobilinogen, UA: 1

## 2011-10-03 LAB — PREGNANCY, URINE: Preg Test, Ur: NEGATIVE

## 2011-10-03 LAB — TSH: TSH: 1.359

## 2011-10-03 LAB — COCAINE, URINE, CONFIRMATION: Benzoylecgonine GC/MS Conf: 1100 ng/mL

## 2011-11-13 ENCOUNTER — Emergency Department (HOSPITAL_COMMUNITY)
Admission: EM | Admit: 2011-11-13 | Discharge: 2011-11-14 | Disposition: A | Discharge status: Other Acute Inpatient Hospital | Payer: No Typology Code available for payment source | Source: Home / Self Care | Attending: Emergency Medicine | Admitting: Emergency Medicine

## 2011-11-13 ENCOUNTER — Encounter: Payer: Self-pay | Admitting: *Deleted

## 2011-11-13 DIAGNOSIS — F29 Unspecified psychosis not due to a substance or known physiological condition: Secondary | ICD-10-CM | POA: Insufficient documentation

## 2011-11-13 DIAGNOSIS — G479 Sleep disorder, unspecified: Secondary | ICD-10-CM | POA: Insufficient documentation

## 2011-11-13 DIAGNOSIS — F22 Delusional disorders: Secondary | ICD-10-CM | POA: Insufficient documentation

## 2011-11-13 DIAGNOSIS — F411 Generalized anxiety disorder: Secondary | ICD-10-CM | POA: Insufficient documentation

## 2011-11-13 HISTORY — DX: Major depressive disorder, single episode, unspecified: F32.9

## 2011-11-13 HISTORY — DX: Depression, unspecified: F32.A

## 2011-11-13 LAB — DIFFERENTIAL
Basophils Absolute: 0 10*3/uL (ref 0.0–0.1)
Basophils Relative: 1 % (ref 0–1)
Eosinophils Absolute: 0.1 10*3/uL (ref 0.0–0.7)
Monocytes Absolute: 0.4 10*3/uL (ref 0.1–1.0)
Neutro Abs: 2.7 10*3/uL (ref 1.7–7.7)
Neutrophils Relative %: 47 % (ref 43–77)

## 2011-11-13 LAB — RAPID URINE DRUG SCREEN, HOSP PERFORMED
Amphetamines: NOT DETECTED
Benzodiazepines: NOT DETECTED
Cocaine: POSITIVE — AB
Opiates: NOT DETECTED
Tetrahydrocannabinol: POSITIVE — AB

## 2011-11-13 LAB — COMPREHENSIVE METABOLIC PANEL
AST: 16 U/L (ref 0–37)
Albumin: 3.4 g/dL — ABNORMAL LOW (ref 3.5–5.2)
Chloride: 107 mEq/L (ref 96–112)
Creatinine, Ser: 0.95 mg/dL (ref 0.50–1.10)
Total Bilirubin: 0.1 mg/dL — ABNORMAL LOW (ref 0.3–1.2)
Total Protein: 6.5 g/dL (ref 6.0–8.3)

## 2011-11-13 LAB — CBC
HCT: 41.9 % (ref 36.0–46.0)
MCHC: 33.4 g/dL (ref 30.0–36.0)
RDW: 15 % (ref 11.5–15.5)

## 2011-11-13 MED ORDER — LORAZEPAM 1 MG PO TABS
1.0000 mg | ORAL_TABLET | Freq: Three times a day (TID) | ORAL | Status: DC | PRN
  Administered 2011-11-14: 1 mg via ORAL
  Filled 2011-11-13: qty 1

## 2011-11-13 MED ORDER — NICOTINE 21 MG/24HR TD PT24: 21.0000 mg | MEDICATED_PATCH | Freq: Every day | TRANSDERMAL | Status: DC

## 2011-11-13 NOTE — ED Provider Notes (Signed)
History     CSN: [id number] Arrival date & time: 11/13/2011  8:39 PM   First MD Initiated Contact with Patient 11/13/11 2050      No chief complaint on file.   (Consider location/radiation/quality/duration/timing/severity/associated sxs/prior treatment) HPI Comments: Patient reports that she has been feeling depressed for the past 2 weeks.  She was taking Seroquel 300mg  every evening and 50mg  during the day.  However, she reports that she ran out of the medication 2 weeks ago and has not taken it.  She reports that she has been feeling suicidal for the past week.  She does not have a plan.  She reports that she has been hearing voices telling her that her life is not worth living and that people are out to get her.  She reports that she had a prior suicidal attempt 4 years ago by overdosing on pills.  She has received inpatient psychiatric treatment in the past.  She is unsure who her current psychiatrist is.  She denies Homicidal ideation.  The history is provided by the patient.    No past medical history on file.  No past surgical history on file.  No family history on file.  History  Substance Use Topics  . Smoking status: Not on file  . Smokeless tobacco: Not on file  . Alcohol Use: Not on file    OB History    No data available      Review of Systems  Constitutional: Negative for fever, chills, fatigue and unexpected weight change.  Eyes: Negative for visual disturbance.  Respiratory: Negative for chest tightness and shortness of breath.   Cardiovascular: Negative for chest pain.  Gastrointestinal: Negative for nausea, vomiting and abdominal pain.  Genitourinary: Negative for dysuria.  Skin: Negative for rash.  Neurological: Negative for dizziness, syncope, weakness, light-headedness and numbness.  Psychiatric/Behavioral: Positive for suicidal ideas, hallucinations, sleep disturbance and dysphoric mood. Negative for confusion, self-injury, decreased concentration  and agitation. The patient is nervous/anxious.     Allergies  Review of patient's allergies indicates no known allergies.  Home Medications  No current outpatient prescriptions on file.  BP 130/102  Pulse 81  Temp(Src) 98.1 F (36.7 C) (Oral)  Resp 16  SpO2 98%  Physical Exam  Constitutional: She is oriented to person, place, and time. She appears well-developed and well-nourished. No distress.  HENT:  Head: Normocephalic and atraumatic.  Eyes: EOM are normal. Pupils are equal, round, and reactive to light.  Neck: Normal range of motion. Neck supple.  Cardiovascular: Normal rate and regular rhythm.   No murmur heard. Pulmonary/Chest: Effort normal and breath sounds normal.  Abdominal: Soft. She exhibits no distension. There is no tenderness.  Musculoskeletal: Normal range of motion.  Neurological: She is alert and oriented to person, place, and time. Coordination normal.  Skin: Skin is warm and dry. She is not diaphoretic.  Psychiatric: She has a normal mood and affect. Her speech is normal and behavior is normal. Thought content is paranoid. Cognition and memory are normal. She expresses suicidal ideation. She expresses no homicidal ideation. She expresses no suicidal plans and no homicidal plans.    ED Course  Procedures (including critical care time)   Labs Reviewed  CBC  DIFFERENTIAL  COMPREHENSIVE METABOLIC PANEL  PREGNANCY, URINE  URINE RAPID DRUG SCREEN (HOSP PERFORMED)  ETHANOL   No results found.   No diagnosis found.  12:33 AM Discussed patient with ACT team.  They report that they will come see the patient in the  ED.  MDM          Pascal Lux Wingen 11/14/11 [phone]

## 2011-11-13 NOTE — ED Notes (Signed)
Pt in c/o SI and depression x1 week, with history of same, admits to some ETOH and crack use

## 2011-11-14 ENCOUNTER — Encounter (HOSPITAL_COMMUNITY): Payer: Self-pay | Admitting: *Deleted

## 2011-11-14 ENCOUNTER — Inpatient Hospital Stay (HOSPITAL_COMMUNITY)
Admission: AD | Admit: 2011-11-14 | Discharge: 2011-11-20 | DRG: 885 | Disposition: A | Discharge status: Home / Self Care | Payer: No Typology Code available for payment source | Source: Ambulatory Visit | Attending: Psychiatry | Admitting: Psychiatry

## 2011-11-14 DIAGNOSIS — F22 Delusional disorders: Secondary | ICD-10-CM

## 2011-11-14 DIAGNOSIS — Z91199 Patient's noncompliance with other medical treatment and regimen due to unspecified reason: Secondary | ICD-10-CM

## 2011-11-14 DIAGNOSIS — F411 Generalized anxiety disorder: Secondary | ICD-10-CM

## 2011-11-14 DIAGNOSIS — F1994 Other psychoactive substance use, unspecified with psychoactive substance-induced mood disorder: Secondary | ICD-10-CM

## 2011-11-14 DIAGNOSIS — G479 Sleep disorder, unspecified: Secondary | ICD-10-CM

## 2011-11-14 DIAGNOSIS — F29 Unspecified psychosis not due to a substance or known physiological condition: Secondary | ICD-10-CM | POA: Diagnosis present

## 2011-11-14 DIAGNOSIS — F323 Major depressive disorder, single episode, severe with psychotic features: Principal | ICD-10-CM

## 2011-11-14 DIAGNOSIS — Z9119 Patient's noncompliance with other medical treatment and regimen: Secondary | ICD-10-CM

## 2011-11-14 DIAGNOSIS — F1210 Cannabis abuse, uncomplicated: Secondary | ICD-10-CM

## 2011-11-14 DIAGNOSIS — F1910 Other psychoactive substance abuse, uncomplicated: Secondary | ICD-10-CM | POA: Diagnosis present

## 2011-11-14 DIAGNOSIS — F1420 Cocaine dependence, uncomplicated: Secondary | ICD-10-CM | POA: Diagnosis present

## 2011-11-14 DIAGNOSIS — R45851 Suicidal ideations: Secondary | ICD-10-CM

## 2011-11-14 MED ORDER — QUETIAPINE FUMARATE 300 MG PO TABS
300.0000 mg | ORAL_TABLET | Freq: Every day | ORAL | Status: DC
  Administered 2011-11-14: 300 mg via ORAL
  Filled 2011-11-14 (×2): qty 1

## 2011-11-14 MED ORDER — QUETIAPINE FUMARATE 100 MG PO TABS: 100.0000 mg | ORAL_TABLET | ORAL | Status: DC

## 2011-11-14 MED ORDER — NICOTINE 21 MG/24HR TD PT24
21.0000 mg | MEDICATED_PATCH | Freq: Every day | TRANSDERMAL | Status: DC
  Administered 2011-11-15: 21 mg via TRANSDERMAL
  Filled 2011-11-14 (×6): qty 1

## 2011-11-14 MED ORDER — ACETAMINOPHEN 325 MG PO TABS: 650.0000 mg | ORAL_TABLET | Freq: Four times a day (QID) | ORAL | Status: DC | PRN

## 2011-11-14 MED ORDER — QUETIAPINE FUMARATE 100 MG PO TABS: 300.0000 mg | ORAL_TABLET | Freq: Every day | ORAL | Status: DC

## 2011-11-14 MED ORDER — MAGNESIUM HYDROXIDE 400 MG/5ML PO SUSP: 30.0000 mL | Freq: Every day | ORAL | Status: DC | PRN

## 2011-11-14 MED ORDER — QUETIAPINE FUMARATE 100 MG PO TABS
100.0000 mg | ORAL_TABLET | ORAL | Status: DC
  Administered 2011-11-14: 100 mg via ORAL
  Filled 2011-11-14: qty 1

## 2011-11-14 MED ORDER — ALUM & MAG HYDROXIDE-SIMETH 200-200-20 MG/5ML PO SUSP: 30.0000 mL | ORAL | Status: DC | PRN

## 2011-11-14 MED ORDER — QUETIAPINE FUMARATE 100 MG PO TABS
100.0000 mg | ORAL_TABLET | Freq: Every day | ORAL | Status: DC
  Administered 2011-11-15: 100 mg via ORAL
  Filled 2011-11-14 (×2): qty 1

## 2011-11-14 NOTE — ED Notes (Signed)
telepsych consult completed.

## 2011-11-14 NOTE — H&P (Signed)
[name] is an 50 y.o. female.   Chief Complaint: Presented to the Ed saying that she was depressed suicidal and hearing voices. They were not command in nature.She was +for cocaine and THC.  HPI: She has just returned to [place] from [place] about 2 weeks ago.Reports she is out of her Seroquel.   Past Medical History  Diagnosis Date  . Depression     History reviewed. No pertinent past surgical history.  History reviewed. No pertinent family history. Social History:  reports that she has been smoking.  She does not have any smokeless tobacco history on file. She reports that she drinks alcohol. She reports that she uses illicit drugs (Cocaine).  Allergies: No Known Allergies  Medications Prior to Admission  Medication Dose Route Frequency Provider Last Rate Last Dose  . acetaminophen (TYLENOL) tablet 650 mg  650 mg Oral Q6H PRN Viviann Spare, NP      . alum & mag hydroxide-simeth (MAALOX/MYLANTA) 200-200-20 MG/5ML suspension 30 mL  30 mL Oral Q4H PRN Viviann Spare, NP      . magnesium hydroxide (MILK OF MAGNESIA) suspension 30 mL  30 mL Oral Daily PRN Viviann Spare, NP      . nicotine (NICODERM CQ - dosed in mg/24 hours) patch 21 mg  21 mg Transdermal Daily Viviann Spare, NP      . QUEtiapine (SEROQUEL) tablet 100 mg  100 mg Oral Daily Berkley Harvey, PHARMD      . QUEtiapine (SEROQUEL) tablet 300 mg  300 mg Oral QHS Viviann Spare, NP      . DISCONTD: LORazepam (ATIVAN) tablet 1 mg  1 mg Oral Q8H PRN Pascal Lux Wingen   1 mg at 11/14/11 0042  . DISCONTD: nicotine (NICODERM CQ - dosed in mg/24 hours) patch 21 mg  21 mg Transdermal Daily Heather Zenaida Niece Wingen      . DISCONTD: QUEtiapine (SEROQUEL) tablet 100 mg  100 mg Oral 1 day or 1 dose Doug Sou, MD   100 mg at 11/14/11 1245  . DISCONTD: QUEtiapine (SEROQUEL) tablet 100 mg  100 mg Oral 1 day or 1 dose Viviann Spare, NP      . DISCONTD: QUEtiapine (SEROQUEL) tablet 300 mg  300 mg Oral QHS Doug Sou, MD       Medications Prior to Admission  Medication Sig Dispense Refill  . QUEtiapine (SEROQUEL) 300 MG tablet Take 300 mg by mouth at bedtime.        Marland Kitchen QUEtiapine (SEROQUEL) 50 MG tablet Take 50 mg by mouth daily.          Results for orders placed during the hospital encounter of 11/13/11 (from the past 48 hour(s))  CBC     Status: Normal   Collection Time   11/13/11  9:35 PM      Component Value Range Comment   WBC 5.7  4.0 - 10.5 (K/uL)    RBC 4.67  3.87 - 5.11 (MIL/uL)    Hemoglobin 14.0  12.0 - 15.0 (g/dL)    HCT 91.4  78.2 - 95.6 (%)    MCV 89.7  78.0 - 100.0 (fL)    MCH 30.0  26.0 - 34.0 (pg)    MCHC 33.4  30.0 - 36.0 (g/dL)    RDW 21.3  08.6 - 57.8 (%)    Platelets 236  150 - 400 (K/uL)   DIFFERENTIAL     Status: Normal   Collection Time   11/13/11  9:35  PM      Component Value Range Comment   Neutrophils Relative 47  43 - 77 (%)    Neutro Abs 2.7  1.7 - 7.7 (K/uL)    Lymphocytes Relative 42  12 - 46 (%)    Lymphs Abs 2.4  0.7 - 4.0 (K/uL)    Monocytes Relative 8  3 - 12 (%)    Monocytes Absolute 0.4  0.1 - 1.0 (K/uL)    Eosinophils Relative 3  0 - 5 (%)    Eosinophils Absolute 0.1  0.0 - 0.7 (K/uL)    Basophils Relative 1  0 - 1 (%)    Basophils Absolute 0.0  0.0 - 0.1 (K/uL)   COMPREHENSIVE METABOLIC PANEL     Status: Abnormal   Collection Time   11/13/11  9:35 PM      Component Value Range Comment   Sodium 140  135 - 145 (mEq/L)    Potassium 3.6  3.5 - 5.1 (mEq/L)    Chloride 107  96 - 112 (mEq/L)    CO2 26  19 - 32 (mEq/L)    Glucose, Bld 98  70 - 99 (mg/dL)    BUN 12  6 - 23 (mg/dL)    Creatinine, Ser 0.10  0.50 - 1.10 (mg/dL)    Calcium 9.2  8.4 - 10.5 (mg/dL)    Total Protein 6.5  6.0 - 8.3 (g/dL)    Albumin 3.4 (*) 3.5 - 5.2 (g/dL)    AST 16  0 - 37 (U/L)    ALT 27  0 - 35 (U/L)    Alkaline Phosphatase 65  39 - 117 (U/L)    Total Bilirubin 0.1 (*) 0.3 - 1.2 (mg/dL)    GFR calc non Af Amer 70 (*) >90 (mL/min)    GFR calc Af Amer 81  (*) >90 (mL/min)   ETHANOL     Status: Normal   Collection Time   11/13/11  9:35 PM      Component Value Range Comment   Alcohol, Ethyl (B) <11  0 - 11 (mg/dL)   PREGNANCY, URINE     Status: Normal   Collection Time   11/13/11  9:39 PM      Component Value Range Comment   Preg Test, Ur NEGATIVE     URINE RAPID DRUG SCREEN (HOSP PERFORMED)     Status: Abnormal   Collection Time   11/13/11  9:39 PM      Component Value Range Comment   Opiates NONE DETECTED  NONE DETECTED     Cocaine POSITIVE (*) NONE DETECTED     Benzodiazepines NONE DETECTED  NONE DETECTED     Amphetamines NONE DETECTED  NONE DETECTED     Tetrahydrocannabinol POSITIVE (*) NONE DETECTED     Barbiturates NONE DETECTED  NONE DETECTED     No results found.  Review of Systems  Constitutional: Negative.   HENT: Negative.   Respiratory: Negative.   Cardiovascular: Negative.   Gastrointestinal: Negative.   Genitourinary: Negative.   Musculoskeletal: Negative.   Skin: Negative.   Neurological: Negative.   Endo/Heme/Allergies: Negative.   Psychiatric/Behavioral: Positive for depression, suicidal ideas, hallucinations and substance abuse.    Blood pressure 100/63, pulse 70, temperature 97.9 F (36.6 C), resp. rate 16, height 4' 11.5" (1.511 m), weight 47.174 kg (104 lb). Physical Exam  Constitutional: She is oriented to person, place, and time. She appears well-developed and well-nourished.  HENT:  Head: Normocephalic.  Neck: Normal range of motion.  Cardiovascular:  Normal rate and regular rhythm.   Respiratory: Effort normal and breath sounds normal.  GI: Soft. Bowel sounds are normal.  Musculoskeletal: Normal range of motion.  Neurological: She is alert and oriented to person, place, and time.  Skin: Skin is warm and dry.  Psychiatric:       Difficult to interview very tired. Since returning to [facility] about 2 weeks ago has not had her Seroquel.      Assessment/Plan Polysubstance abuse induced  psychosis Support through withdrawal  Reestablish psych meds  As  indicated   ADAMS,MICKIE D. 11/14/2011, 8:18 PM

## 2011-11-14 NOTE — ED Provider Notes (Signed)
Medical screening examination/treatment/procedure(s) were performed by non-physician practitioner and as supervising physician I was immediately available for consultation/collaboration.  Cyndra Numbers, MD 11/14/11 [phone]

## 2011-11-14 NOTE — ED Provider Notes (Addendum)
735b alert pleasant cooperative. Pt feels suicidal . Plan is overdose.   Has h/o prior suicisde attempts.  Is out of seeroquel.   Ter;e[psych consult ordered  Doug Sou, MD 11/14/11 0739 245 pm pt alert cooperative ambulatory gcs 15 stable for trassfer to [facility]  Doug Sou, MD 11/14/11 1742

## 2011-11-14 NOTE — Progress Notes (Signed)
Psychiatric Admission Assessment Adult  Patient Identification:  [name] Date of Evaluation:  11/14/2011 Chief Complaint:  MDD with Psychosis History of Present Illness:Says she has returned her to [place] about 2 weeks ago from [place]. Told ED she was depressed with SI ( no plan)  No recent attempts or gestures.  She was +cocaine and THC says she has used since age 50. Gets SSDI for Mental illness -no other income. Very drowsy and not very cooperative with interview. Mood Symptoms:  Past 2 Weeks Depression Symptoms:  depressed mood, fatigue, suicidal thoughts without plan and insomnia has been non complaint with her Seroquel and abusing cocaine and THC (Hypo) Manic Symptoms:   Elevated Mood:  No Irritable Mood:  Yes Grandiosity:  No Distractibility:  No Labiality of Mood:  No Delusions:  No Hallucinations:  No Impulsivity:  No Sexually Inappropriate Behavior:  No Financial Extravagance:  No Flight of Ideas:  No  Anxiety Symptoms: Excessive Worry:  No Panic Symptoms:  No Agoraphobia:  No Obsessive Compulsive: No  Symptoms: None Specific Phobias:  No Social Anxiety:  No  Psychotic Symptoms:  Hallucinations:  Auditory Delusions:  No Paranoia:  No   Ideas of Reference:  No  PTSD Symptoms: Ever had a traumatic exposure:  No Had a traumatic exposure in the last month:  No Re-experiencing:  None Hypervigilance:  No Hyperarousal:  None Avoidance:  None  Traumatic Brain Injury:  No history reported  Past Psychiatric History: Diagnosis:Polysubstance induced  AH   Hospitalizations:in past -can't give me details   Outpatient Care:has been to [facility] can't tell me date of last visit  Substance Abuse Care:needs it   Self-Mutilation:none at this time   Suicidal Attempts:reports an overdose on pills 4 years ago   Violent Behaviors:none    Past Medical History:   Past Medical History  Diagnosis Date  . Depression    History of Loss of Consciousness:   No Seizure History:  No Cardiac History:  No Allergies:  No Known Allergies Current Medications:  Current Facility-Administered Medications  Medication Dose Route Frequency Provider Last Rate Last Dose  . acetaminophen (TYLENOL) tablet 650 mg  650 mg Oral Q6H PRN Viviann Spare, NP      . alum & mag hydroxide-simeth (MAALOX/MYLANTA) 200-200-20 MG/5ML suspension 30 mL  30 mL Oral Q4H PRN Viviann Spare, NP      . magnesium hydroxide (MILK OF MAGNESIA) suspension 30 mL  30 mL Oral Daily PRN Viviann Spare, NP      . nicotine (NICODERM CQ - dosed in mg/24 hours) patch 21 mg  21 mg Transdermal Daily Viviann Spare, NP      . QUEtiapine (SEROQUEL) tablet 100 mg  100 mg Oral Daily Berkley Harvey, PHARMD      . QUEtiapine (SEROQUEL) tablet 300 mg  300 mg Oral QHS Viviann Spare, NP      . DISCONTD: QUEtiapine (SEROQUEL) tablet 100 mg  100 mg Oral 1 day or 1 dose Viviann Spare, NP       Facility-Administered Medications Ordered in Other Encounters  Medication Dose Route Frequency Provider Last Rate Last Dose  . DISCONTD: LORazepam (ATIVAN) tablet 1 mg  1 mg Oral Q8H PRN Pascal Lux Wingen   1 mg at 11/14/11 0042  . DISCONTD: nicotine (NICODERM CQ - dosed in mg/24 hours) patch 21 mg  21 mg Transdermal Daily Heather Zenaida Niece Wingen      . DISCONTD: QUEtiapine (SEROQUEL) tablet 100 mg  100  mg Oral 1 day or 1 dose Doug Sou, MD   100 mg at 11/14/11 1245  . DISCONTD: QUEtiapine (SEROQUEL) tablet 300 mg  300 mg Oral QHS Doug Sou, MD        Previous Psychotropic Medications:  Medication Dose                        Substance Abuse History in the last 12 months: Substance Age of 1st Use Last Use Amount Specific Type  Nicotine      Alcohol      Cannabis 37 +now    Opiates      Cocaine 37 +now    Methamphetamines      LSD      Ecstasy      Benzodiazepines      Caffeine      Inhalants      Others:                         Medical Consequences of Substance  Abuse:None   Legal Consequences of Substance Abuse:9yo son with his grandmother in [place]   Family Consequences of Substance Abuse:doesn't have her son  Blackouts:  No DT's:  No Withdrawal Symptoms:  None  Social History: Current Place of Residence:   Place of Birth:   Family Members: Marital Status:  Single Children:  Sons:one age 68 lives with his grandmother in [place]  Daughters:one age 59  Relationships: Education:  HS Print production planner Problems/Performance: Religious Beliefs/Practices: History of Abuse (Emotional/Phsycial/Sexual) Occupational Experiences; Hotel manager History:  None  Legal History: Hobbies/Interests:  Family History:  History reviewed. No pertinent family history.  Mental Status Examination/Evaluation: Objective:  Appearance: Fairly Groomed  Eye Contact::  Poor  Speech:  Slow  Volume:  Decreased  Mood:  Sleepy irritable   Affect:  Blunted  Thought Process:  Couldn't assess   Orientation:  Full  Thought Content:  Hallucinations: Auditory does not appear to be responding   Suicidal Thoughts:  Yes.  without intent/plan  Homicidal Thoughts:  No  Judgement:  Impaired  Insight:  Lacking  Psychomotor Activity:  Decreased  Akathisia:  No  Handed:  Right  AIMS (if indicated):     Assets:  Resilience    Laboratory/X-Ray Psychological Evaluation(s)      Assessment:  Substance Abuse induced Mood DO   AXIS I Substance abuse induced Mood Do  AXIS II Deferred  AXIS III Past Medical History  Diagnosis Date  . Depression      AXIS IV economic problems, educational problems, housing problems, occupational problems and other psychosocial or environmental problems  AXIS V 51-60 moderate symptoms   Treatment Plan/Recommendations: Supportive care until withdrawn from cocaine and THC. Assess need for antidepressants and or antipsychotics once withdrawn.  Treatment Plan Summary: Adjust meds as indicated-reports Seroquel from [facility]. They confirm  no history for Seroquel only Flagyl and that was at least 2 years ago.   Observation Level/Precautions:  C.O. 15 min checks   Laboratory:  Check TSH   Psychotherapy: Group    Medications:  Assess in am   Routine PRN Medications:  Yes  Consultations:    Discharge Concerns:  Support?   Other:      Seema Blum,MICKIE D. 11/20/20128:26 PM

## 2011-11-14 NOTE — Progress Notes (Signed)
Patient ID: [name], female   DOB: [birth year], 50 y.o.   MRN: [id number] Admission note:  50 y/o voluntary admitted. Has been off her meds for two weeks and had used crack once 50 dollars worth  This past week. Says that she hears voices at times.  She was cooperative and calm on admission.  See assessment for additional information.

## 2011-11-14 NOTE — BH Assessment (Signed)
Assessment Note   [name] is an 50 y.o. female. PT PRESENTS WITH INCREASE DEPRESSION & SUICIDAL THOUGHTS W/O NO PLAN & VOICES TELLING HER TO HARM SELF. PT HAS NOT BEEN COMPLIANT WITH MEDS X 2WKS. PT STATES SHE HAS BEEN UNDER A LOT OF STRESS WHICH INVOLVED MOVING FROM [place] WHERE SHE WAS LIVING BY HERSELF SO SHE MOVED BACK TO [place] TO [place] WITH A FRIEND. PT IS PARNOID FEELS LIKE PEOPLE ARE FOLLOWING OR AFTER HER. PT ADMITS TO COCAINE USE & LAST USE WAS 2 DAYS AGO. PT DENIES HI. PT STATES SHE WANTS TO GET BACK ON MEDS. PT ADMITS TO DECREASE SLEEP  BUT EATING IS FINE. AUNT HAS A HX OF DEPRESSION, PT EXPRESSED DAUGHTER IS HER SUPPORT SYSTEM. PT HAS BEEN SENT TO [facility] FOR REVIEW.  Axis I: MAJOR DEPRESSIVE WITH PSYCHOTIC FEATURES Axis II: Deferred Axis III:  Past Medical History  Diagnosis Date  . Depression    Axis IV: other psychosocial or environmental problems and problems with primary support group Axis V: 21-30 behavior considerably influenced by delusions or hallucinations OR serious impairment in judgment, communication OR inability to function in almost all areas  Past Medical History:  Past Medical History  Diagnosis Date  . Depression     History reviewed. No pertinent past surgical history.  Family History: History reviewed. No pertinent family history.  Social History:  reports that she has been smoking.  She does not have any smokeless tobacco history on file. She reports that she drinks alcohol. She reports that she uses illicit drugs (Cocaine).  Allergies: No Known Allergies  Home Medications:  Medications Prior to Admission  Medication Dose Route Frequency Provider Last Rate Last Dose  . LORazepam (ATIVAN) tablet 1 mg  1 mg Oral Q8H PRN Pascal Lux Wingen      . nicotine (NICODERM CQ - dosed in mg/24 hours) patch 21 mg  21 mg Transdermal Daily Pascal Lux Wingen       No current outpatient prescriptions on file as of 11/13/2011.    OB/GYN Status:  No LMP  recorded.     Risk to self Is patient at risk for suicide?: Yes Substance abuse history and/or treatment for substance abuse?: Yes                        Values / Beliefs Cultural Requests During Hospitalization: None Spiritual Requests During Hospitalization: None              Disposition:     On Site Evaluation by:   Reviewed with Physician:     Waldron Session 11/14/2011 12:29 AM

## 2011-11-14 NOTE — ED Notes (Signed)
BP written incorrectly; BP 114//68

## 2011-11-14 NOTE — Progress Notes (Signed)
This Clinical research associate has requested telepsych on patient. Pending return call.  Ileene Hutchinson , MSW, LCSWA 11/14/2011 10:37 AM

## 2011-11-14 NOTE — Progress Notes (Signed)
Patient ID: [name], female   DOB: May 03, 1961, 50 y.o.   MRN: [id number]  Patient has been asleep for majority of shift this evening. Patient had a nicotine patch scheduled at 1915 but patient refused stating that she would take it in the morning. HS medication was administered in room because patient was asleep. During this time patient was requesting a snack. Food was provided to patient and patient went back to bed. States that she has no previous issues related to falling asleep.

## 2011-11-15 DIAGNOSIS — F339 Major depressive disorder, recurrent, unspecified: Secondary | ICD-10-CM

## 2011-11-15 DIAGNOSIS — F29 Unspecified psychosis not due to a substance or known physiological condition: Secondary | ICD-10-CM

## 2011-11-15 MED ORDER — QUETIAPINE FUMARATE 50 MG PO TABS
50.0000 mg | ORAL_TABLET | Freq: Every day | ORAL | Status: DC
  Administered 2011-11-15 – 2011-11-19 (×5): 50 mg via ORAL
  Filled 2011-11-15 (×6): qty 1

## 2011-11-15 MED ORDER — QUETIAPINE FUMARATE 300 MG PO TABS
300.0000 mg | ORAL_TABLET | Freq: Every day | ORAL | Status: DC
  Administered 2011-11-15 – 2011-11-16 (×2): 300 mg via ORAL
  Filled 2011-11-15 (×2): qty 1

## 2011-11-15 NOTE — Progress Notes (Signed)
Pt attended AM group.  Minimal participation.  Says she recently relocated to [facility].  Off meds.  Relapse.  Same story as every other admission.  Plans to return home at stabilization.  States she has a safe place to go.  Follow up at [facility].

## 2011-11-15 NOTE — Progress Notes (Signed)
[facility] Group Notes:  (Counselor/Nursing/MHT/Case Management/Adjunct)  11/15/2011 2:07 PM  Type of Therapy:  Process group at 11:00am  Participation Level:  Did Not Attend  [name] 11/15/2011, 2:07 PM

## 2011-11-15 NOTE — Progress Notes (Signed)
[facility] Group Notes:  (Counselor/Nursing/MHT/Case Management/Adjunct)  11/15/2011 2:09 PM  Type of Therapy:  Process group at 1:15 PM  Participation Level:  None  Participation Quality:  Drowsy and asleep; pt reports due to medication  Affect:  Blunted  Cognitive:  Oriented  Insight:  None  Engagement in Group:  None  Engagement in Therapy:  None  Modes of Intervention:  Clarification, Orientation and Support  Summary of Progress/Problems: Patient attended first counseling group of stay yet was unable to remain awake for session ~ pt reported when she came into group that she was exhausted but glad to be in setting where she can get back on medication regimen   [name] 11/15/2011, 2:09 PM

## 2011-11-15 NOTE — Progress Notes (Signed)
Patient ID: [name], female   DOB: May 30, 1961, 50 y.o.   MRN: [id number] Nursing Pt. is rating her Depression as 7/10.She is not participating in Groups as she has remained in bed all morning.She did take her  medication as ordered.Encouraged her to go to Group as it is part of her treatment. Will follow.

## 2011-11-15 NOTE — Progress Notes (Signed)
Suicide Risk Assessment  Admission Assessment     Demographic factors:  See chart/record.  HPI: Suicidal ideation reported upon admission.  [name] reported AH of voices telling her "somebody was coming after me and was going to kill me."  Denied VH.  She reported a history of self injurious behavior with her last episode of cutting 5 yrs ago and 4 years ago she overdosed on an unidentified "pile of pills".  Reported relapse on cocaine and marijuana.  Interest in returning to sobriety. Pt reported mood to be "ok".  Denied any chronic medical problems.  Said Seroquel helped her in the past with "anxiety, sleep, keeping level and the AH".    Loss Factors:  No reported recent loss/trauma. Historical Factors:  Family history of suicide reported upon admission. Risk Reduction Factors:  Strong family support, religion and interest in sobriety/recovery.  CLINICAL FACTORS: Cocaine Abuse; Cannabis Abuse; Psychotic Disorder NOS  Denied the use of alcohol, other illicit drug use or prescription drug abuse.  COGNITIVE FEATURES THAT CONTRIBUTE TO RISK: none.  SUICIDE RISK: Pt at chronic increased risk of harm to self in light of past history and risk factors.  No acute risk.  She denied any current thoughts of self harm, suicidal ideation, and homicidal ideation.  She is interested in getting back on her medication and continuing her sobriety.  Pt contracted for safety and reported feeling "safe" on the unit.  Pt at low risk of harm to others.    PLAN OF CARE: 1. Restart antipsychotic per pt request.  See orders.  Medication education completed.  Pros/Cons/Risks/Alternatives to treatment discussed with pt.  Pt agreeable with plan. 2. Continue q15 minute checks per unit policy. 3. Encourage continued sobriety and use of NA to mitigate against future risk for relapse. 4. Pt request for Ensure bid.  Weight at 120, ideal at 135 per pt.    [name] 11/15/2011, 10:38 AM

## 2011-11-16 DIAGNOSIS — F1420 Cocaine dependence, uncomplicated: Secondary | ICD-10-CM | POA: Diagnosis present

## 2011-11-16 DIAGNOSIS — F29 Unspecified psychosis not due to a substance or known physiological condition: Secondary | ICD-10-CM | POA: Diagnosis present

## 2011-11-16 DIAGNOSIS — F1910 Other psychoactive substance abuse, uncomplicated: Secondary | ICD-10-CM

## 2011-11-16 MED ORDER — THERA M PLUS PO TABS
1.0000 | ORAL_TABLET | Freq: Every day | ORAL | Status: DC
  Administered 2011-11-16 – 2011-11-20 (×4): 1 via ORAL
  Filled 2011-11-16 (×5): qty 1

## 2011-11-16 NOTE — Progress Notes (Signed)
[facility] Group Notes:  (Counselor/Nursing/MHT/Case Management/Adjunct)  11/16/2011 12:56 PM  Type of Therapy:  group therapy  Participation Level:  Active  Participation Quality:  Appropriate, Attentive and Sharing  Affect:  Appropriate  Cognitive:  Appropriate  Insight:  Good  Engagement in Group:  Good  Engagement in Therapy:  Good  Modes of Intervention:  Problem-solving, Support and exploration  Summary of Progress/Problems: Pt was able to share that she had been struggling with a crack addiction, pt showed great insight by stating she needs to remain on her medications as se has had to learn the hard way that she ends up in a bad cycle- stops meds, hears voices, self-medicates with crack. Pt states she is hopeful.   Purcell Nails 11/16/2011, 12:56 PM

## 2011-11-16 NOTE — Progress Notes (Signed)
Patient has been up and on the telephone this afternoon.  She has interacted with staff minimally this afternoon.  She is currently in bed asleep; she denies any physical pain or hallucinations.  She denies depression/HI.  Patient has minimal eye contact; intact thought processes.  Continue to assess and maintain safety.

## 2011-11-16 NOTE — Progress Notes (Signed)
Patient ID: [name], female   DOB: 1961-10-22, 50 y.o.   MRN: [id number]  S:  Tykera is up and about in the milieu, greets me with a smile, pleasant on approach.  She reports she relapsed on cocaine after having 2 years of complete abstinence during which she did very well on 300mg  Seroquel q hs, and 50mg  Seroquel q am.  Then she got into a relationship, and they started to argue, a lot of conflict, and she relapsed.  She was living in [place], but now has returned to [place].   Then she got very depressed and began to experience an exacerbation of old auditory hallucinations with voices saying she is 'no good' and should just end it. She has chronic problems with significant cocaine cravings and has thoughts of using every day.  When she is clean and on her Seroquel this does a good job of suppressing all her cravings and the auditory hallucinations and keeps her in a stable mood.   She is currently living in town and will be going back to [facility].  She is considering going to a friend's house in [place].   She denies any dangerous ideas today.  She rates her depression a 6, and her anxiety a 9/10 today, but feels the anxiety is because she skipped her am Seroquel which she has now requested from the nurse.   MSE:  Pleasant, fully alert, articulate speech. No dangerous ideas.  No evidence of any acute withdrawal symptoms.  Thinking is non-psychotic.   Plan:  Continue current plan.  Discharge planning by CM.

## 2011-11-16 NOTE — Progress Notes (Signed)
Pt spent most of the morning in bed  But did get up and attend AA group   She is depressed and anxious  She interacts minimally but appropriately   She is compliant with treatment and contracts for safety   Verbal support given  Medications administered and effectiveness monitored  Q 15 min checks   Pt safe at present

## 2011-11-16 NOTE — Progress Notes (Signed)
Patient ID: [name], female   DOB: [birth year], 51 y.o.   MRN: [id number] Pt. Pleasant, denies SHI, AVH. Pt. Did not go to group tonight. Pt. Got up for snack and went back to bed. Staff will continue to monitor q33min for safety.

## 2011-11-17 MED ORDER — QUETIAPINE FUMARATE 400 MG PO TABS
400.0000 mg | ORAL_TABLET | Freq: Every day | ORAL | Status: DC
  Administered 2011-11-17 – 2011-11-18 (×2): 400 mg via ORAL
  Filled 2011-11-17 (×4): qty 1

## 2011-11-17 NOTE — Progress Notes (Signed)
[facility] MD Progress Note  11/17/2011 10:43 AM  Subjective: Patient seen and evaluated. Chart reviewed. Patient was sleeping in the bed and stated that she was having difficulty sleeping and wanted an increase in her Seroquel. She took 400 mg at bedtime in the past without any reported side effects. She said she is feeling better today and denied any other acute issues. She also requested Vaseline twice a day for her hair and denied any other difficulties at this time.  Objective: Patient reports her mood to be "good". Her affect is mood congruent and euthymic. She denied any current thoughts of self injurious behavior, suicidal ideation or homicidal ideation. She endorsed continued auditory hallucinations and requested an increase in Seroquel. She denied any visual hallucinations. There is no paranoia or delusional thought processes noted. Her speech was normal rate, tone and volume. Her eye contact was good. Her insight is limited and judgment is fair.  Sleep:  Number of Hours: 6.5   Vital Signs:Blood pressure 132/76, pulse 76, temperature 97.7 F (36.5 C), temperature source Oral, resp. rate 16, height 4' 11.5" (1.511 m), weight 47.174 kg (104 lb).  Lab Results: No results found for this or any previous visit (from the past 48 hour(s)).  Assessement/Plan: At this time, patient reports that she is feeling better. Nevertheless, she asked for an increase in Seroquel.  We will increase it from 300 to 400 mg at bedtime. She denied any previous side effects from the medication. Medication education provided, no other changes noted at this time. Discussed with the team.  Lupe Carney 11/17/2011, 10:43 AM

## 2011-11-17 NOTE — Progress Notes (Signed)
Patient ID: [name], female   DOB: [birth year], 50 y.o.   MRN: [id number] No distress noted, pt. Resting eyes closed. Staff will contiue safety checks q49min.

## 2011-11-17 NOTE — Progress Notes (Addendum)
Patient seen during d/c planning group.  She is known from previous hospital admissions.  She reports being off meds and relapsing on cocaine after two years of sobriety.  She advised of relocating from [place] area where she was doing well back to [place].  She advised of having a place to live at discharge.  She is not interested in residential treatment.  She will need assistance with medication and a bus pass at discharge.  Tayva currently denies SI/HI.  Suicide prevention education reviewed.     Authorized q 3 days per [employer] protocol.

## 2011-11-17 NOTE — Progress Notes (Signed)
[facility] Group Notes:  (Counselor/Nursing/MHT/Case Management/Adjunct)  11/17/2011 3:14 PM  Type of Therapy:  Group at 1:15pm  Participation Level:  Minimal  Participation Quality:  Appropriate  Affect:  Appropriate  Cognitive:  Oriented  Insight:  Good  Engagement in Group:  Good  Engagement in Therapy:  Limited  Modes of Intervention:  Activity, Socialization and Support  Summary of Progress/Problems: Group played 'Ungame' in afternoon as we needed something to lift mood after an unpleasant outburst by one pt.b [name] shared appropriately on a question and seemed more comfortable than usual. Pt did share that that she loves spontaneity; likes to move from place to place a lot and one reason may be that she gets bored easily. When asked to describe difference between being comfortable and being bored pt stated' they often feel the same.Marland KitchenMarland KitchenMarland KitchenMaybe'    [name] 11/17/2011, 3:14 PM

## 2011-11-17 NOTE — Progress Notes (Signed)
[facility] Adult Inpatient Family/Significant Other Suicide Prevention Education  Suicide Prevention Education:  Patient Refusal for Family/Significant Other Suicide Prevention Education: The patient [name] has reported that her Daughter, 49YO [name] would be the best person to xcontact although pt will not be living with her at discharge yet Tia reportedly does not have a working phone. An attempt was made to locate daughter during visiting hour at lunch today; Clinical research associate was unsuccessful.  Writer provided suicide prevention education directly to patient; conversation included risk factors, warning signs and resources to contact for help. Mobile crisis services explained and contact card placed in chart for pt to receive at discharge.   [name] 11/17/2011, 3:35 PM

## 2011-11-17 NOTE — Progress Notes (Signed)
[facility] Group Notes:  (Counselor/Nursing/MHT/Case Management/Adjunct)  11/17/2011 12:20 PM  Type of Therapy:  Group Processing at 11am  Participation Level:  Minimal  Participation Quality:  Appropriate  Affect:  Flat  Cognitive:  Oriented  Insight:  Limited  Engagement in Group:  Limited  Engagement in Therapy:  Limited  Modes of Intervention:  Clarification, Socialization and Support  Summary of Progress/Problems: Pt was in group session for only 15 minutes yet her attendance was supported as she reports groups are hard for her. Pt reports that self care is not a high priority for her and not usual behavior for her.    [name] 11/17/2011, 12:20 PM

## 2011-11-17 NOTE — Progress Notes (Signed)
Patient ID: [name], female   DOB: [birth year], 50 y.o.   MRN: [id number] Pt did attend a group today but left early, slept most of the am.  She denies SI/HI/AVH and pain.  Pt has stated groups are hard-encouragement continues.

## 2011-11-18 NOTE — Progress Notes (Signed)
Pt did not attend wrap up group tonight, pleasant on approach.  Pt had been laying down in room.  Denies complaints, denies SI/HI/hallucinations at this time.  Pt states that she did hear "some" voices earlier today, they are off and on.  Appropriate on unit.  Support and encouragement offered, will continue to monitor.

## 2011-11-18 NOTE — Progress Notes (Signed)
[facility] MD Progress Note  11/18/2011 12:17 PM  Subjective: Patient seen and evaluated in her room. Patient refused to get out of bed and said that she was feeling "okay". She slept better with the increased Seroquel. Discharge planning is in progress and she is stable at this time.  Objective: Patient stated that her mood was "okay". Her affect was mood congruent, yet slightly irritable.  She denied any current auditory or visual hallucinations, suicidal ideation or homicidal ideation. She reported that she slept well last night and denied any acute complications. Her speech is normal rate, tone and volume. Her insight is limited and her judgment is fair. Her thought process was linear and goal directed. There is no apparent paranoia or thought disturbance noted.  She seems to be doing well and denied any acute complications at this time.  Sleep:  Number of Hours: 6.25   Vital Signs:Blood pressure 132/76, pulse 76, temperature 97.7 F (36.5 C), temperature source Oral, resp. rate 16, height 4' 11.5" (1.511 m), weight 47.174 kg (104 lb).  Lab Results: No results found for this or any previous visit (from the past 48 hour(s)).  A/Plan: Continue current meds. Initiating discharge process.  Lupe Carney 11/18/2011, 12:17 PM

## 2011-11-18 NOTE — Progress Notes (Signed)
Pt. Pleasant, reports decreased sleep, writer reviewed med Seroquel increase to 400 mg. Denies SHI, AVH, Staff will continue to monitor q15min for safety.

## 2011-11-18 NOTE — Progress Notes (Signed)
[facility] Group Notes:  (Counselor/Nursing/MHT/Case Management/Adjunct)  11/18/2011 1:15 PM  Type of Therapy:  Group Therapy, Dance/Movement Therapy   Participation Level:  Did Not Attend   [name]  

## 2011-11-19 MED ORDER — HYDROXYZINE HCL 50 MG PO TABS: 50.0000 mg | ORAL_TABLET | Freq: Every evening | ORAL | Status: DC | PRN

## 2011-11-19 MED ORDER — NICOTINE 21 MG/24HR TD PT24
21.0000 mg | MEDICATED_PATCH | Freq: Every day | TRANSDERMAL | Status: DC
  Filled 2011-11-19 (×2): qty 1

## 2011-11-19 MED ORDER — QUETIAPINE FUMARATE ER 400 MG PO TB24
400.0000 mg | ORAL_TABLET | Freq: Every day | ORAL | Status: DC
  Administered 2011-11-19: 400 mg via ORAL
  Filled 2011-11-19 (×2): qty 1

## 2011-11-19 NOTE — Progress Notes (Signed)
[facility] Group Notes:  (Counselor/Nursing/MHT/Case Management/Adjunct)  11/19/2011 1:15 PM  Type of Therapy:  Group Therapy, Dance/Movement Therapy   Participation Level:  Did Not Attend   [facility]

## 2011-11-19 NOTE — Progress Notes (Signed)
Pt stayed in bed all morning she said because she did not get any sleep last night   She said the generic seroquel was not helping her relax   Pt was encouraged to talk to her doctor about the possibility of changing to the brandname if available in pharmacy  Verbal support given  Medications administered and effectiveness monitored   Educate on medications and diet and activities   Encouraged the importance of groups   Q 15 min checks  Pt safe at present

## 2011-11-19 NOTE — Progress Notes (Signed)
Pt received scheduled Seroquel XR at 1800 and was asleep when group began.  Pt just couldn't get up for the final group and remains in bed at this time.  Pt has been positive for groups during day and has been appropriate on the unit. Respirations even and unlabored, will continue to monitor.

## 2011-11-19 NOTE — Progress Notes (Signed)
Patient ID: [name], female   DOB: 1961-01-18, 50 y.o.   MRN: [id number]   [name] presents complaining of vague auditory hallucinations - still occuring intermittently, without commands but still distracting her.  She is attributing this to the fact that we are using a generic Seroquel instead of brand name.  However, she has used cocaine within the past 2 weeks and had stopped her Seroquel prior to that.   O: She is polite and cooperative but affect remains blunt. Appears distracted. Denies suicidal thoughts, plans or intent.  Group participation has been satisfactory.  Speech normal. Mood irritable.   A:  Mood Disorder NOS Cocaine Dependence  Plan:  Will switch to Seroquel 400mg  XR q 1800 which she has used before and thinks this will work better for her.   Discharge planning.

## 2011-11-20 MED ORDER — QUETIAPINE FUMARATE ER 300 MG PO TB24: 300.0000 mg | ORAL_TABLET | Freq: Every day | ORAL | Status: DC

## 2011-11-20 NOTE — Progress Notes (Signed)
Suicide Risk Assessment  Discharge Assessment     Demographic factors: See chart/record.   HPI: Suicidal ideation reported upon admission. Ms. Demauro reported AH of voices telling her "somebody was coming after me and was going to kill me." Denied VH. She reported a history of self injurious behavior with her last episode of cutting 5 yrs ago and 4 years ago she overdosed on an unidentified "pile of pills". Reported relapse on cocaine and marijuana. Interest in returning to sobriety. Pt's mood and sleep stabilized.  No chronic medical problems. Seroquel working well for pt and she is stable for discharge.  Loss Factors: No reported recent loss/trauma.   Historical Factors: Family history of suicide reported upon admission.   Risk Reduction Factors: Strong family support, religion and interest in sobriety/recovery.  Insight into need for medication.   CLINICAL FACTORS: Cocaine Abuse; Cannabis Abuse; Psychotic Disorder NOS  Denied the use of alcohol, other illicit drug use or prescription drug abuse.   COGNITIVE FEATURES THAT CONTRIBUTE TO RISK: none.   SUICIDE RISK: Pt at chronic increased risk of harm to self in light of past history and risk factors. No acute risk. She has continually denied throughout admission any current thoughts of self harm, suicidal ideation, and homicidal ideation. She is interested in continuing her medication and maintaining sobriety. Pt contracted for safety and reported feeling "safe" to go home to friend's house.  Pt at low risk of harm to others.   PLAN OF CARE:  1. Continue current meds.  See discharge orders. Medication education completed. Pros/Cons/Risks/Alternatives to treatment discussed with pt. Pt agreeable with plan.  2. Encourage continued sobriety and use of NA to mitigate against future risk for relapse.  3. Follow Up with [facility]. 4. Pt stable for discharge.  Kuroski-Mazzei, Mihaela Fajardo 11/20/2011, 10:00 AM

## 2011-11-20 NOTE — Progress Notes (Signed)
[facility] Group Notes:  (Counselor/Nursing/MHT/Case Management/Adjunct)  11/20/2011 2:31 PM  Type of Therapy:  [facility] Presentation at 1:15pm  Participation Level:  Minimal  Participation Quality:  Distracting  Affect:  Anxious and Excited  Cognitive:  Oriented  Insight:  Limited  Engagement in Group:  Limited  Engagement in Therapy:  Limited  Modes of Intervention:  Socialization and Support  Summary of Progress/Problems: [name] stated at the beginning of group time that she was interested in resources available at [facility] yet her behavior was in conflict as she was in and out of group room multiple times. [name] was working on word puzzles during [facility] volunteer's telling of her own mental health and substance abuse challenges.    [name] 11/20/2011, 2:31 PM

## 2011-11-20 NOTE — Progress Notes (Signed)
Pt..discharged to friend @ 1400.She denies S/I,H/I and A/V hallucinations.All belongings and instructions  With sample meds given to patient.Escorted to [employer].

## 2011-11-20 NOTE — Progress Notes (Signed)
Pt to d/c today.  Met all inpt goals.  Sent with scripts, meds.  Follow up [facility].

## 2011-11-20 NOTE — Progress Notes (Signed)
[facility] Group Notes:  (Counselor/Nursing/MHT/Case Management/Adjunct)  11/20/2011 2:28 PM  Type of Therapy:  Group Processing at 11am  Participation Level:  Did Not Attend  [name] 11/20/2011, 2:28 PM

## 2011-11-21 NOTE — Discharge Summary (Signed)
Discharge Note  Patient:  [name] is an 50 y.o., female DOB:  1961-01-13  Date of Admission:  11/14/2011  Date of Discharge:  11/20/11  HPI: Suicidal ideation reported upon admission. [name] reported AH of voices telling her "somebody was coming after me and was going to kill me." Denied VH. She reported [name] history of self injurious behavior with her last episode of cutting 5 yrs ago and 4 years ago she overdosed on an unidentified "pile of pills". Reported relapse on cocaine and marijuana. Interest in returning to sobriety. Pt's mood and sleep stabilized. No chronic medical problems. Seroquel working well for pt and she is stable for discharge.   Course of Hospitalization: [name] was admitted to our dual diagnosis unit.  Participation in group therapy was good, Interactions with peers was appropriate.  She was denying SI by her second hospital day. She reported the Seroquel worked very well to stabilize her mood, and historically also worked well to suppress her chronic cocaine cravings.    Loss Factors: No reported recent loss/trauma.   Historical Factors: Family history of suicide reported upon admission.   PLAN OF CARE:  1. Continue current meds. See discharge orders. Medication education completed. Pros/Cons/Risks/Alternatives to treatment discussed with pt. Pt agreeable with plan.  2. Encourage continued sobriety and use of NA to mitigate against future risk for relapse.  3. Follow Up with [facility].  4. Pt stable for discharge.  Discharge Medications:   Seroquel XR 300mg  QHS   Axis Diagnosis:   Axis I: Psychosis due to Polysubstance abuse, Cocaine Dependence Axis II:  No diagnosis' Axis III: No diagnosis Axis IV: Housing issues stabilizing Axis V: 55    [name] 11/21/2011, 5:46 PM

## 2011-11-21 NOTE — Progress Notes (Signed)
Patient Discharge Instructions:  Dictated admission note faxed, Date faxed:  11/21/2011 D/C instructions faxed, Date faxed:  11/21/2011 Med. Rec. Form faxed, Date faxed:  11/21/2011 Facesheet faxed 11/21/2011  Faxed to [facility] [phone]  Wandra Scot, 11/21/2011, 2:41 PM

## 2011-12-06 ENCOUNTER — Encounter (HOSPITAL_COMMUNITY): Payer: Self-pay | Admitting: Adult Health

## 2011-12-06 ENCOUNTER — Encounter (HOSPITAL_COMMUNITY): Payer: Self-pay | Admitting: *Deleted

## 2011-12-06 ENCOUNTER — Inpatient Hospital Stay (HOSPITAL_COMMUNITY)
Admission: AD | Admit: 2011-12-06 | Discharge: 2011-12-15 | DRG: 885 | Disposition: A | Discharge status: Home / Self Care | Payer: No Typology Code available for payment source | Source: Ambulatory Visit | Attending: Psychiatry | Admitting: Psychiatry

## 2011-12-06 ENCOUNTER — Emergency Department (EMERGENCY_DEPARTMENT_HOSPITAL)
Admission: EM | Admit: 2011-12-06 | Discharge: 2011-12-06 | Disposition: A | Discharge status: Other Acute Inpatient Hospital | Payer: No Typology Code available for payment source | Source: Home / Self Care | Attending: Emergency Medicine | Admitting: Emergency Medicine

## 2011-12-06 DIAGNOSIS — Z79899 Other long term (current) drug therapy: Secondary | ICD-10-CM

## 2011-12-06 DIAGNOSIS — R45851 Suicidal ideations: Secondary | ICD-10-CM

## 2011-12-06 DIAGNOSIS — F259 Schizoaffective disorder, unspecified: Secondary | ICD-10-CM

## 2011-12-06 DIAGNOSIS — F1420 Cocaine dependence, uncomplicated: Secondary | ICD-10-CM

## 2011-12-06 DIAGNOSIS — F29 Unspecified psychosis not due to a substance or known physiological condition: Principal | ICD-10-CM

## 2011-12-06 DIAGNOSIS — F329 Major depressive disorder, single episode, unspecified: Secondary | ICD-10-CM

## 2011-12-06 DIAGNOSIS — F3289 Other specified depressive episodes: Secondary | ICD-10-CM

## 2011-12-06 DIAGNOSIS — F39 Unspecified mood [affective] disorder: Secondary | ICD-10-CM

## 2011-12-06 DIAGNOSIS — F1410 Cocaine abuse, uncomplicated: Secondary | ICD-10-CM | POA: Diagnosis present

## 2011-12-06 DIAGNOSIS — F1920 Other psychoactive substance dependence, uncomplicated: Secondary | ICD-10-CM

## 2011-12-06 DIAGNOSIS — F1210 Cannabis abuse, uncomplicated: Secondary | ICD-10-CM

## 2011-12-06 LAB — CBC
MCH: 29.4 pg (ref 26.0–34.0)
MCV: 87.7 fL (ref 78.0–100.0)
Platelets: 274 10*3/uL (ref 150–400)
RBC: 4.89 MIL/uL (ref 3.87–5.11)
RDW: 14.2 % (ref 11.5–15.5)
WBC: 4.8 10*3/uL (ref 4.0–10.5)

## 2011-12-06 LAB — RAPID URINE DRUG SCREEN, HOSP PERFORMED
Amphetamines: NOT DETECTED
Benzodiazepines: NOT DETECTED
Opiates: NOT DETECTED
Tetrahydrocannabinol: NOT DETECTED

## 2011-12-06 LAB — URINE MICROSCOPIC-ADD ON

## 2011-12-06 LAB — URINALYSIS, ROUTINE W REFLEX MICROSCOPIC
Bilirubin Urine: NEGATIVE
Glucose, UA: NEGATIVE mg/dL
Hgb urine dipstick: NEGATIVE
Protein, ur: NEGATIVE mg/dL

## 2011-12-06 LAB — DIFFERENTIAL
Basophils Absolute: 0 10*3/uL (ref 0.0–0.1)
Eosinophils Absolute: 0.2 10*3/uL (ref 0.0–0.7)
Eosinophils Relative: 3 % (ref 0–5)
Lymphocytes Relative: 31 % (ref 12–46)
Neutrophils Relative %: 56 % (ref 43–77)

## 2011-12-06 LAB — COMPREHENSIVE METABOLIC PANEL
ALT: 17 U/L (ref 0–35)
AST: 25 U/L (ref 0–37)
Alkaline Phosphatase: 69 U/L (ref 39–117)
Calcium: 9.1 mg/dL (ref 8.4–10.5)
Potassium: 3.7 mEq/L (ref 3.5–5.1)
Sodium: 139 mEq/L (ref 135–145)
Total Protein: 6.8 g/dL (ref 6.0–8.3)

## 2011-12-06 LAB — PREGNANCY, URINE: Preg Test, Ur: NEGATIVE

## 2011-12-06 MED ORDER — IBUPROFEN 600 MG PO TABS: 600.0000 mg | ORAL_TABLET | Freq: Three times a day (TID) | ORAL | Status: DC | PRN

## 2011-12-06 MED ORDER — MAGNESIUM HYDROXIDE 400 MG/5ML PO SUSP: 30.0000 mL | Freq: Every day | ORAL | Status: DC | PRN

## 2011-12-06 MED ORDER — ALUM & MAG HYDROXIDE-SIMETH 200-200-20 MG/5ML PO SUSP: 30.0000 mL | ORAL | Status: DC | PRN

## 2011-12-06 MED ORDER — QUETIAPINE FUMARATE ER 300 MG PO TB24
300.0000 mg | ORAL_TABLET | Freq: Every day | ORAL | Status: DC
  Administered 2011-12-07: 300 mg via ORAL
  Filled 2011-12-06 (×3): qty 1

## 2011-12-06 MED ORDER — ZOLPIDEM TARTRATE 5 MG PO TABS: 5.0000 mg | ORAL_TABLET | Freq: Every evening | ORAL | Status: DC | PRN

## 2011-12-06 MED ORDER — QUETIAPINE FUMARATE 50 MG PO TABS: 50.0000 mg | ORAL_TABLET | Freq: Every day | ORAL | Status: DC

## 2011-12-06 MED ORDER — QUETIAPINE FUMARATE ER 300 MG PO TB24
300.0000 mg | ORAL_TABLET | Freq: Every day | ORAL | Status: DC
  Administered 2011-12-06: 300 mg via ORAL
  Filled 2011-12-06 (×3): qty 1

## 2011-12-06 MED ORDER — ACETAMINOPHEN 325 MG PO TABS: 650.0000 mg | ORAL_TABLET | ORAL | Status: DC | PRN

## 2011-12-06 MED ORDER — ACETAMINOPHEN 325 MG PO TABS: 650.0000 mg | ORAL_TABLET | Freq: Four times a day (QID) | ORAL | Status: DC | PRN

## 2011-12-06 MED ORDER — NICOTINE 21 MG/24HR TD PT24
21.0000 mg | MEDICATED_PATCH | Freq: Every day | TRANSDERMAL | Status: DC
  Filled 2011-12-06: qty 1

## 2011-12-06 MED ORDER — ONDANSETRON HCL 4 MG PO TABS: 4.0000 mg | ORAL_TABLET | Freq: Three times a day (TID) | ORAL | Status: DC | PRN

## 2011-12-06 MED ORDER — LORAZEPAM 1 MG PO TABS: 1.0000 mg | ORAL_TABLET | Freq: Three times a day (TID) | ORAL | Status: DC | PRN

## 2011-12-06 NOTE — ED Notes (Signed)
Pt states she just left [facility] and got around the wrong people and now is feeling SI. Denies a plan and denies HI.

## 2011-12-06 NOTE — ED Provider Notes (Signed)
Pt accepted to [facility], will transfer.   Laray Anger, DO 12/06/11 2221

## 2011-12-06 NOTE — ED Provider Notes (Signed)
History     CSN: [id number] Arrival date & time: 12/06/2011  8:15 AM   First MD Initiated Contact with Patient 12/06/11 [phone]      Chief Complaint  Patient presents with  . Depression     HPI 50yoF h/o bipolar, schizophrenia pw worsening depression and SI. Recently discharged from [facility] for same. States she went home with worsening depression. Today with +SI. No plan. H/O Suicide attempt in past (wrist cutting, drug O/D). Denies HI. States she is hearing voices. Denies etoh, illicit drugs. States she needs to be admitted again. Denies CP/SOB/back pain/abd pain/n/v/f/c. Denies hematuria/dysuria/freq/urgency   ED Notes, ED Provider Notes from 12/06/11 0000 to 12/06/11 08:41:40       Janett Billow Delories Heinz, RN 12/06/2011 08:41      Pt states she just left [facility] and got around the wrong people and now is feeling SI. Denies a plan and denies HI.    Past Medical History  Diagnosis Date  . Depression     History reviewed. No pertinent past surgical history.  History reviewed. No pertinent family history.  History  Substance Use Topics  . Smoking status: Current Everyday Smoker  . Smokeless tobacco: Not on file  . Alcohol Use: Yes    OB History    Grav Para Term Preterm Abortions TAB SAB Ect Mult Living                  Review of Systems  All other systems reviewed and are negative.   except as noted HPI   Allergies  Review of patient's allergies indicates no known allergies.  Home Medications   Current Outpatient Rx  Name Route Sig Dispense Refill  . QUETIAPINE FUMARATE 300 MG PO TB24 Oral Take 1 tablet (300 mg total) by mouth daily. 30 tablet 0    Take daily at 6pm    BP 139/74  Pulse 80  Temp(Src) 97.7 F (36.5 C) (Oral)  Resp 16  SpO2 100%  Physical Exam  Nursing note and vitals reviewed. Constitutional: She is oriented to person, place, and time. She appears well-developed.  HENT:  Head: Atraumatic.  Mouth/Throat: Oropharynx is clear and moist.  Eyes:  Conjunctivae and EOM are normal. Pupils are equal, round, and reactive to light.  Neck: Normal range of motion. Neck supple.  Cardiovascular: Normal rate, regular rhythm, normal heart sounds and intact distal pulses.   Pulmonary/Chest: Effort normal and breath sounds normal. No respiratory distress. She has no wheezes. She has no rales.  Abdominal: Soft. She exhibits no distension. There is no tenderness. There is no rebound and no guarding.  Musculoskeletal: Normal range of motion.  Neurological: She is alert and oriented to person, place, and time.  Skin: Skin is warm and dry. No rash noted.  Psychiatric:       Appears slightly anxious    ED Course  Procedures (including critical care time)  Labs Reviewed  COMPREHENSIVE METABOLIC PANEL - Abnormal; Notable for the following:    Glucose, Bld 114 (*)    GFR calc non Af Amer 74 (*)    GFR calc Af Amer 86 (*)    All other components within normal limits  SALICYLATE LEVEL - Abnormal; Notable for the following:    Salicylate Lvl <2.0 (*)    All other components within normal limits  URINALYSIS, ROUTINE W REFLEX MICROSCOPIC - Abnormal; Notable for the following:    Leukocytes, UA SMALL (*)    All other components within normal limits  URINE  RAPID DRUG SCREEN (HOSP PERFORMED) - Abnormal; Notable for the following:    Cocaine POSITIVE (*)    All other components within normal limits  URINE MICROSCOPIC-ADD ON - Abnormal; Notable for the following:    Squamous Epithelial / LPF FEW (*)    Bacteria, UA FEW (*)    All other components within normal limits  CBC  DIFFERENTIAL  ETHANOL  ACETAMINOPHEN LEVEL  PREGNANCY, URINE   No results found.   1. Suicidal ideation   2. Depression       MDM  Worsening depressed mood, soft SI. Check labs. Discussed with ACT TEAM. Will evaluate patient in the ED  Patient medically cleared.  Stefano Gaul, MD         Forbes Cellar, MD 12/06/11 [phone]

## 2011-12-06 NOTE — ED Notes (Addendum)
Patient moved to TCU 27, alert oriented, warm blanket given, patient is calm cooperative and has no complaints at this time, states she was discharged from [facility] and "got in with the wrong crowd", states she is depressed and mad at herself for bad decisions, emotional support given, denies HI or plan for SI,  will continue to monitor

## 2011-12-06 NOTE — ED Notes (Signed)
Pt has been accepted to [facility] by Dr. Rogers Blocker to Dr. Catha Brow bed 302-2. EDP has been notified and is agreement with disposition. RN made aware. All support paperwork has been completed and faxed to behavioral health. No further CSW needs at this time.

## 2011-12-06 NOTE — ED Notes (Signed)
Two patient belonging bags and one black duffel bag locked in activity room.
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
## 2011-12-06 NOTE — ED Notes (Signed)
Report given to [name], RN.
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
## 2011-12-07 DIAGNOSIS — F1410 Cocaine abuse, uncomplicated: Secondary | ICD-10-CM | POA: Diagnosis present

## 2011-12-07 NOTE — Tx Team (Signed)
Initial Interdisciplinary Treatment Plan  PATIENT STRENGTHS: (choose at least two) Ability for insight Active sense of humor Capable of independent living General fund of knowledge Motivation for treatment/growth Physical Health  PATIENT STRESSORS: Financial difficulties Marital or family conflict Substance abuse   PROBLEM LIST: Problem List/Patient Goals Date to be addressed Date deferred Reason deferred Estimated date of resolution  Depression      Substance abuse      SI                                           DISCHARGE CRITERIA:  Ability to meet basic life and health needs Adequate post-discharge living arrangements Improved stabilization in mood, thinking, and/or behavior Motivation to continue treatment in a less acute level of care Need for constant or close observation no longer present Reduction of life-threatening or endangering symptoms to within safe limits Safe-care adequate arrangements made Verbal commitment to aftercare and medication compliance Withdrawal symptoms are absent or subacute and managed without 24-hour nursing intervention  PRELIMINARY DISCHARGE PLAN: Outpatient therapy Return to previous living arrangement  PATIENT/FAMIILY INVOLVEMENT: This treatment plan has been presented to and reviewed with the patient, [name].  The patient has been given the opportunity to ask questions and make suggestions.  Arturo Morton 12/07/2011, 12:08 AM

## 2011-12-07 NOTE — Progress Notes (Signed)
[facility] Group Notes:  (Counselor/Nursing/MHT/Case Management/Adjunct)  12/07/2011 1:10 PM  Type of Therapy:  group therapy  Participation Level:  Did Not Attend    Summary of Progress/Problems:   Purcell Nails 12/07/2011, 1:10 PM
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
## 2011-12-07 NOTE — Progress Notes (Signed)
Pt admitted yesterday for help with substance abuse. Needed encouragement today to attend groups. Rates depression/hopelessness at 7. Reports having SI due to crack/cocaine abuse. Denies having a plan. Contracts for her safety. Did not follow through with [facility] after last admission. She now expresses strong desire to get help with her crack/cocaine abuse. Sleep fair and energy level low. Pt was able to talk with treatment team openly this morning regarding her issues. Remains safe on the unit.

## 2011-12-07 NOTE — Progress Notes (Addendum)
[facility] Adult Inpatient Family/Significant Other Suicide Prevention Education  Suicide Prevention Education:  Contact Attempts: Pt's adult daughter [phone] [name] has been identified by the patient as the family member/significant other with whom the patient will be residing, and identified as the person(s) who will aid the patient in the event of a mental health crisis.  With written consent from the patient, two attempts were made to provide suicide prevention education, prior to and/or following the patient's discharge.  We were unsuccessful in providing suicide prevention education.  A suicide education pamphlet was given to the patient to share with family/significant other.  Date and time of first attempt:4:55 PM 12/07/2011  Date and time of second attempt: 2:40 PM 12/08/2011 Vanetta Mulders, LPCA   Purcell Nails 12/07/2011, 4:55 PM

## 2011-12-07 NOTE — Treatment Plan (Addendum)
Interdisciplinary Treatment Plan Update (Adult)  Date: 12/07/2011  Time Reviewed: 10:07 AM   Progress in Treatment: Attending groups: Yes Participating in groups: Yes Taking medication as prescribed: Yes Tolerating medication: Yes   Family/Significant othe contact made:CM to call daughter with pt    Patient understands diagnosis:  Yes As evidenced by stating she needs help with staying off of crack cocaine Discussing patient identified problems/goals with staff:  Yes See below Medical problems stabilized or resolved:  Yes Denies suicidal/homicidal ideation: Yes Issues/concerns per patient self-inventory:  No not turned in Other:  New problem(s) identified: N/A  Reason for Continuation of Hospitalization: Depression Medication stabilization Withdrawal symptoms  Interventions implemented related to continuation of hospitalization: Insist on group attendance and participation  Additional comments:CM to call daughter with [name] to talk about plan  Estimated length of stay:3-5 days  Discharge Plan:Unidentified  New goal(s): N/A  Review of initial/current patient goals per problem list:   1.  Goal(s):Identify comprehensive sobriety plan.  Met:  No  Target date:12/17  As evidenced by:[name] will identify next best step.  Options range from rehab to shelter to leaving the area  2.  Goal (s):[name]'s depression and hopelessness will feel like it is under control  Met:  No  Target date:12/17  As evidenced QM:VHQI inventory rating of 3 or less  3.  Goal(s):  Met:  No  Target date:  As evidenced by:  4.  Goal(s):  Met:  No  Target date:  As evidenced by:  Attendees: Patient:  [name] 12/07/2011 10:07 AM  Family:     Physician:  Lupe Carney 12/07/2011 10:07 AM   Nursing:  Fransisca Kaufmann  12/07/2011 10:07 AM   Case Manager:  Richelle Ito, LCSW 12/07/2011 10:07 AM   Counselor:     Other: Verne Spurr  12/07/2011 10:07 AM  Other:     Other:     Other:       Scribe for Treatment Team:   Daryel Gerald B, 12/07/2011 10:07 AM

## 2011-12-07 NOTE — Progress Notes (Signed)
Pt admitted yesterday for help with substance abuse. She has needed encouragement to attend groups today. Talked in treatment team this morning about not following through with [facility] on last admission. Pt verbalizes sincere desire to now go get help with her crack/cocaine abuse. Reports SI but no plan. Able to contract for her safety. Rates depression/hopelessness at 7. Her appetite and ability to pay attention are already improving. Energy level is still low. Pt very open to discussing her reasons for admission. Continues to remain safe on every fifteen minute safety checks.
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
## 2011-12-07 NOTE — Progress Notes (Signed)
Adult Psychosocial Assessment Update Interdisciplinary Team  Previous [facility] admissions/discharges:  Admissions Discharges  Date:11/14/11 Date:11/20/11  Date: Date:  Date: Date:  Date: Date:  Date: Date:   Changes since the last Psychosocial Assessment (including adherence to outpatient mental health and/or substance abuse treatment, situational issues contributing to decompensation and/or relapse). Pt shared she felt she did not have a good plan as she and pt broke-up causing her to relapse, pt feels she needs long tern rehab treatment and needs to get out of the area to stay sober. Pt did state she stayed on meds.             Discharge Plan 1. Will you be returning to the same living situation after discharge?   Yes: No:      If no, what is your plan?    Pt would like to go for longer term treatment at a rehab center       2. Would you like a referral for services when you are discharged? Yes:     If yes, for what services?  No:       Yes rehab, additionally pt stated the importance of staying on meds, and wants med management        Summary and Recommendations (to be completed by the evaluator) Pt is a 50 yo female with an addiction to crack/cocaine and reports SI, pt will benefit from group therapy for crisis stablization, med evaluation, psychoeducation for coping skills, and case management for discharge planning.                        Signature:  Purcell Nails, 12/07/2011 3:35 PM

## 2011-12-07 NOTE — Progress Notes (Signed)
Suicide Risk Assessment  Admission Assessment     Demographic factors: See chart/record.   HPI: Suicidal ideation reported upon admission. [name] reported continued AH and passive SI with need to "get clean". Reported relapse on cocaine. Interest in returning to sobriety and "rehab".   Loss Factors: No reported recent loss/trauma.   Historical Factors: Family history of suicide reported upon admission  Self injurious behavior with her last episode of cutting 5 yrs ago and 4 years ago she overdosed on an unidentified "pile of pills".   Risk Reduction Factors: Strong family support, religion and interest in sobriety/recovery.  Insight into need for medication.   CLINICAL FACTORS: Cocaine Dependence; Cannabis Abuse; Psychotic Disorder NOS  Denied the use of alcohol, other illicit drug use or prescription drug abuse.   COGNITIVE FEATURES THAT CONTRIBUTE TO RISK: limited insight.  SUICIDE RISK: Pt at chronic increased risk of harm to self in light of past history and risk factors. No acute risk on unit.  She currently denied any thoughts of self harm, suicidal ideation, and homicidal ideation. She is interested in restarting her medication and maintaining sobriety. Pt contracted for safety and reported feeling "safe" on the unit.     PLAN OF CARE:  1. Restarted Seroquel.  Medication education completed. Pros/Cons/Risks/Alternatives to treatment discussed with pt. Pt agreeable with plan.  2. Encourage continued sobriety and will help pt explore f/u options.   Pt admitted for crisis stabilization and treatment.  Please see orders.   Medications reviewed with pt and medication education provided.  Pt also seen and evaluated with physician extender, please see H&P.  Will continue q15 minute checks per unit protocol.  No clinical indication for one on one level of observation at this time.  Pt contracting for safety.  Mental health treatment, medication management and continued sobriety will  mitigate against the increased risk of harm to self and/or others.  Discussed the importance of recovery with pt, as well as, tools to move forward in a healthy & safe manner.  Pt agreeable with the plan.  Discussed with the team.   [name] 12/07/2011, 11:21 AM

## 2011-12-08 DIAGNOSIS — F259 Schizoaffective disorder, unspecified: Secondary | ICD-10-CM

## 2011-12-08 MED ORDER — METRONIDAZOLE 0.75 % VA GEL
1.0000 | Freq: Two times a day (BID) | VAGINAL | Status: AC
  Administered 2011-12-08 – 2011-12-12 (×10): 1 via VAGINAL
  Filled 2011-12-08: qty 70

## 2011-12-08 MED ORDER — QUETIAPINE FUMARATE 300 MG PO TABS
300.0000 mg | ORAL_TABLET | ORAL | Status: DC
  Administered 2011-12-08 – 2011-12-09 (×2): 300 mg via ORAL
  Filled 2011-12-08 (×4): qty 1

## 2011-12-08 NOTE — Progress Notes (Signed)
Pt bright on approach, stated that she would like her night time Seroquel to be changed for XR to regular, and to be scheduled for 2100.  This is the way the patient takes the medication at home.  Pt states that this is the only way she can sleep at night.  No other complaints voiced, denies SI/HI/hallucinations.  Doctor on call notified of need for change, and in agreement.  Pt pleased, currently in bed.  Will continue to monitor.

## 2011-12-08 NOTE — Progress Notes (Signed)
Patient ID: [name], female   DOB: September 09, 1961, 50 y.o.   MRN: [id number] 12/06/2011 Pt has been OOB UAL on the unit today..tolerated fair. She has been compliant with her groups and has voiced no complaints. She will send UA sopecimend for STD check per MD order. No meds given . Pt contracts for safety . She compl;eted her slef invnetory and worte on it that she still had off and on SI and that her depression and hopelessness were " 5/5". A She is processing on 300 hall. R Safey is maintaiend and POC cont PDRN BC

## 2011-12-08 NOTE — Progress Notes (Signed)
[facility] MD Progress Note  12/08/2011 1:04 PM   [name] 12/08/2011, 1:04 PM [name] is a 50 y.o. female [id number] November 09, 1961   Diagnosis:  Axis I: Substance Abuse R/o vaginal infection  Vital Signs:Blood pressure 102/62, pulse 82, temperature 97.9 F (36.6 C), temperature source Oral, resp. rate 20, height 5' (1.524 m), weight 114 lb (51.71 kg).  AIMS: CIWA:    COWS:     Subjective/Objective: [name] is resting in bed late in the morning.  She did attend the 1st group this morning, but has slept through the second one.  She states the medication is over sedating her and takes too long to work. She is requesting a change from Seroquel XR to regular Seroquel. She also complains of a vaginal discharge with a fishy odor. She is requesting an antibiotic.   Mental Status: Pt is up and active in the unit milieu. She was resting in bed and had missed the 2nd morning group. General Appearance: Disheveled with low cut shirt. Behavior: Cooperative, guarded, uncooperative Eye Contact:  Fair Motor Behavior:  Normal Speech:  Regular rate and rhythm, normal volume, coherent and goal directed. Level of Consciousness:  Alert Mood:  Irritable Affect:  Appropriate Anxiety Level:  Minimal Thought Process:  Linear and organized. Thought Content:  No auditory or visual hallucinations. No evidence of psychosis. Perception:  Normal Judgment:  Poor Insight:  Absent Cognition:  Orientation time, place and person Sleep:  Number of Hours: 4.75  Appetite:  as usual   Lab Results: No new labs.   Medications Scheduled:     . QUEtiapine  300 mg Oral QHS     PRN Meds acetaminophen, alum & mag hydroxide-simeth, magnesium hydroxide  Assessment/Plan: R/O Bacterial vaginosis. Increase pm dose of seroquel   Continue current plan of care with no changes at this time.  Anticipated D/C is 2-4 days. GC/CZ cultures are ordered and Metrogel for suspected Bvag.  [name]. [name]  [facility] 12/08/2011

## 2011-12-08 NOTE — Progress Notes (Signed)
[name] is a 50 y.o. female [id number] July 17, 1961   Diagnosis:  Axis I: Substance Abuse Principal Problem:  *Cocaine abuse   Subjective/Objective: [name] is up and active in the group milieu.  [name] is dressed and covered appropriately.  [name] is requesting that her seroquel be increased at hs as [name] still has some racing thoughts.  [name] states that the XR prep of Seroquel makes her drowsy through out the day. The results of her recent labs were discussed with her and [name] reports [name] is feeling better using the Metrogel.   Vital Signs:Blood pressure 127/86, pulse 84, temperature 97 F (36.1 C), temperature source Oral, resp. rate 18, height 5' (1.524 m), weight 114 lb (51.71 kg). Mental Status: General Appearance [name]:  Casual Eye Contact:  Good Motor Behavior:  Normal less sedated and more activated Speech:  Normal Level of Consciousness:  Alert Mood:  Euthymic Affect:  Appropriate Anxiety Level:  Minimal Thought Process:  Coherent Thought Content:  WNL [name] reports [name] still hears a few voices but no VH. No command voices. Perception:  Normal Judgment:  Fair Insight:  Present Cognition:  Orientation time, place and person Sleep:  Number of Hours: 4.75     Assessment/Plan: [name] is much more alert and focused today. [name] has much goal oriented activity and appears to be motivated to participate in groups and her recovery.  Due to her reports of racing thoughts, and some auditory hallucinations an increase in Seroquel is reasonable. [name] will continue the metrogel until completion.     Lab Results:   BMET    Component Value Date/Time   NA 139 12/06/2011 0845   K 3.7 12/06/2011 0845   CL 105 12/06/2011 0845   CO2 26 12/06/2011 0845   GLUCOSE 114* 12/06/2011 0845   BUN 12 12/06/2011 0845   CREATININE 0.90 12/06/2011 0845   CALCIUM 9.1 12/06/2011 0845   GFRNONAA 74* 12/06/2011 0845   GFRAA 86* 12/06/2011 0845    Medications:  Scheduled:      . QUEtiapine  300 mg Oral QHS     PRN Meds acetaminophen, alum & mag hydroxide-simeth, magnesium hydroxide   [name] 12/08/2011

## 2011-12-09 DIAGNOSIS — F1410 Cocaine abuse, uncomplicated: Secondary | ICD-10-CM

## 2011-12-09 LAB — CHLAMYDIA PROBE AMPLIFICATION, URINE: Chlamydia, Swab/Urine, PCR: NEGATIVE

## 2011-12-09 LAB — GC PROBE AMPLIFICATION, URINE: GC Probe Amp, Urine: NEGATIVE

## 2011-12-09 NOTE — Progress Notes (Signed)
Patient ID: [name], female   DOB: 1961-01-03, 50 y.o.   MRN: [id number] 12-09-11 nursing shift assessment; pt has been pleasant today, taking meds and going to groups. On her inventory sheet she wrote slept fair, appetite improving, energy normal and attention improving. Depression and hopelessness rated at 7. Withdrawal symptoms are agitation. Some passive si but able to contract.  No physical problems reported but rated pain at 8. She stated she see no problems staying on meds after discharge. RN will monitor and safety checks continue every 15 minutes.

## 2011-12-09 NOTE — Progress Notes (Signed)
Patient ID: Jazariah Teall, female   DOB: 03-25-1961, 50 y.o.   MRN: [id number]  [facility] Group Notes:  (Counselor/Nursing/MHT/Case Management/Adjunct)  12/09/2011 1:15 PM  Type of Therapy:  Group Therapy, Dance/Movement Therapy   Participation Level:  Active  Participation Quality:  Appropriate  Affect:  Anxious  Cognitive:  Oriented  Insight:  Limited  Engagement in Group:  Good  Engagement in Therapy:  Good  Modes of Intervention:  Clarification, Problem-solving, Role-play, Socialization and Support  Summary of Progress/Problems:   Pt shared that she was feeling "on the edge." Group discussed how negative thoughts and isolation are a form of self sabatoge. Using one group member as an example, the group explored ways to avoid this by embodying the negative and positive thoughts in a drama therapy role play. Pt was able to externalize past, present and future thought process. This allowed not only the group member, but the group as a whole, to practice a coping strategy to use when feeling stuck. Pt. Willingly accepted support from her peers.  Thomasena Edis, [employer]
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
## 2011-12-09 NOTE — Progress Notes (Signed)
Pt resting in bed, pleasant and bright upon approach.  Interacting appropriately within milieu, no complaints voiced.  Positive for group.  Support and encouragement offered, will continue to monitor.

## 2011-12-10 MED ORDER — QUETIAPINE FUMARATE 400 MG PO TABS
400.0000 mg | ORAL_TABLET | ORAL | Status: DC
  Administered 2011-12-10 – 2011-12-14 (×5): 400 mg via ORAL
  Filled 2011-12-10 (×6): qty 1
  Filled 2011-12-10: qty 7
  Filled 2011-12-10 (×2): qty 1

## 2011-12-10 MED ORDER — ADULT MULTIVITAMIN W/MINERALS CH
1.0000 | ORAL_TABLET | Freq: Every day | ORAL | Status: DC
  Administered 2011-12-10 – 2011-12-15 (×6): 1 via ORAL
  Filled 2011-12-10 (×7): qty 1

## 2011-12-10 NOTE — Progress Notes (Signed)
Pt got up with bright affect, but stated that she is "tired of traveling" and just wants her medicine.  Pt seemed to be referring to a dream that she had had.  Once medication received Pt laughingly states "thank you, you saved my life".   No inappropriate conduct noted.  Pleasant.  Denies SI/HI/hallucinations at this time.  Support and encouragement offered, will continue to monitor.

## 2011-12-10 NOTE — Progress Notes (Signed)
Patient ID: [name], female   DOB: [birth year], 50 y.o.   MRN: [id number]   [facility] Group Notes:  (Counselor/Nursing/MHT/Case Management/Adjunct)  12/10/2011 1:15 PM  Type of Therapy:  Group Therapy, Dance/Movement Therapy   Participation Level:  Active  Participation Quality:  Appropriate  Affect:  Anxious  Cognitive:  Oriented  Insight:  Limited  Engagement in Group:  Good  Engagement in Therapy:  Good  Modes of Intervention:  Clarification, Problem-solving, Role-play, Socialization and Support  Summary of Progress/Problems:  Pt stated that he felt like the color purple because she was "in the middle" - not feeling happy but not feeling sad either. Group discussed and practiced ways to avoid unhealthy people and situations that may trigger them to relapse by verbally and non-verbally saying "no." In order to maintain focus on sobriety upon D/C, pt said that she wants to stay sober forever.  Thomasena Edis, [employer]

## 2011-12-10 NOTE — Progress Notes (Signed)
Patient ID: [name], female   DOB: 02-05-1961, 50 y.o.   MRN: [id number] 12-10-11 pt has been pleasant today, smiling, interacting on the unit and came to group. She has had no complaints today. On her inventory sheet she wrote sleep fair, appetite improving, energy normal, attention improving, with depression and hopelessness at 7.  Withdrawal symptoms are agitation. She stated she is having passive SI but is able to contract. No physical problems. No pain. She stated at discharge she will have no problems staying on her medications. RN will continue to monitor and safety checks continue every 15 minutes.

## 2011-12-11 NOTE — Progress Notes (Signed)
[facility] Group Notes:  (Counselor/Nursing/MHT/Case Management/Adjunct)  12/11/2011 12:47 PM   Type of Therapy:  Processing Group at 11:00 am  Participation Level:   Active (while there)  Participation Quality:  Monopolize any yet redirectable  Affect:  Labile  Cognitive:  Oriented  Insight:  Limited  Engagement in Group:  Limited  Engagement in Therapy:  None  Modes of Intervention:  Limit setting and support  Summary of Progress/Problems: Patient shared her busy biggest obstacle to recovery would be continuing to use drugs and alcohol. When asked what she can do to stay clean, patient shared she can make better choices. Discussion involving choices was monopolized by patient. [name] was ultimately redirectable and shared it she has no choice when using; [name] left group early and process.  [facility] Group Notes:  (Counselor/Nursing/MHT/Case Management/Adjunct)  12/11/2011 4:05 PM   Type of Therapy:  Counseling Group at 1:15 pm  Participation Level:  Minimal  Participation Quality: Attentive;  at times; may have been responding to internal stimuli stimuli  Affect:  Labile  Cognitive:  Oriented  Insight:  Poor  Engagement in Group:  Good  Engagement in Therapy:  9 Modes of Intervention:  Clarification, redirection and reality testing.  Summary of Progress/Problems:  Ronda Fairly, LCSWA 12/11/2011 4:08 PM

## 2011-12-11 NOTE — Progress Notes (Signed)
[facility] MD Progress Note  12/11/2011 2:25 PM  Diagnosis:  Axis I: Schizoaffective Disorder Subjective;  [name] is up and active in the milieu.  She is appropriate and pleasant. She voices that she feels better and that her meds are working. She states she is attending groups and working with her counselor and Sports coach. ADL's:  Intact  Sleep:  Yes,  AEB:  Appetite:  Yes,  AEB:  Suicidal Ideation:   Plan:  No  Intent:  No  Means:  No  Homicidal Ideation:   Plan:  No  Intent:  No  Means:  No  AEB (as evidenced by):  Patient's statements.  Mental Status: General Appearance Casual Behavior: cooperative and positive and upbeat. Eye Contact:  Good Motor Behavior:  Normal Speech:  Normal Level of Consciousness:  Alert Mood:  Euthymic Affect:  appropriate Anxiety Level:  Minimal Thought Process:  Coherent Thought Content:  WNL Perception:  Normal Judgment:  Good Insight:  Absent Cognition:  Orientation time, place and person Sleep:  Number of Hours: 6   Vital Signs:Blood pressure 123/75, pulse 89, temperature 98 F (36.7 C), temperature source Oral, resp. rate 18, height 5' (1.524 m), weight 114 lb (51.71 kg).  Lab Results: No results found for this or any previous visit (from the past 48 hour(s)).  Physical Findings: AIMS:  , ,  ,  ,    CIWA:    COWS:     Treatment Plan Summary: Daily contact with patient to assess and evaluate symptoms and progress in treatment Medication management  Plan:  [name] 12/11/2011, 2:25 PM

## 2011-12-11 NOTE — Progress Notes (Signed)
Patient ID: [name], female   DOB: Apr 11, 1961, 50 y.o.   MRN: [id number]   Patient up in group earlier. Currently laid back down in bed. Reports depression and hopelessness "7" on scale today. Reports some passive SI while awake today but no plan. Staff will continue to monitor and encourage group attendance.

## 2011-12-11 NOTE — Progress Notes (Signed)
Pt was in the game room reading on initial encounter.  Pt was asking about her dose of Miconazole vaginal cream that was missed at 1700.  Informed pt that the pharmacy had indeed sent the med and she could get it now.  Pt is here for detox after relapsing when she was kicked out of [facility].  She has no withdrawal symptoms at this time.  She is pleasant and cooperative.  Discharge plans still incomplete.  Safety maintained with q15 minute checks.

## 2011-12-12 DIAGNOSIS — F39 Unspecified mood [affective] disorder: Secondary | ICD-10-CM

## 2011-12-12 MED ORDER — QUETIAPINE FUMARATE 50 MG PO TABS
50.0000 mg | ORAL_TABLET | Freq: Two times a day (BID) | ORAL | Status: DC
  Administered 2011-12-12 – 2011-12-15 (×6): 50 mg via ORAL
  Filled 2011-12-12 (×3): qty 1
  Filled 2011-12-12: qty 14
  Filled 2011-12-12 (×2): qty 1
  Filled 2011-12-12: qty 14
  Filled 2011-12-12: qty 1

## 2011-12-12 NOTE — Treatment Plan (Signed)
Interdisciplinary Treatment Plan Update (Adult)  Date: 12/12/2011  Time Reviewed: 8:17 AM   Progress in Treatment: Attending groups: Yes Participating in groups: Yes Taking medication as prescribed: Yes Tolerating medication: Yes   Family/Significant othe contact made:   Patient understands diagnosis:  Yes Discussing patient identified problems/goals with staff:  Yes Medical problems stabilized or resolved:  Yes Denies suicidal/homicidal ideation: Yes Issues/concerns per patient self-inventory:  Yes  Anxiety Other:  New problem(s) identified: N/A  Reason for Continuation of Hospitalization: Depression Medication stabilization  Interventions implemented related to continuation of hospitalization: Encourage group attendance, participation  Address anxiety with medication, coping skills  Additional comments:  Estimated length of stay:D/C Thurs  Discharge Plan:Shelter  Follow up at [facility]): N/A  Review of initial/current patient goals per problem list:   1.  Goal(s):Decrease anxiety to a manageable level  Met:  No  Target date:12/19  As evidenced ZO:XWRU report that anxiety is under control  2.  Goal (s):Identify comprehensive sobriety plan  Met:  Yes  Target date:  As evidenced by:[name] will identify next best step  3.  Goal(s):[name]'s hoplessness and depression will feel like it is under control  Met:  Yes  Target date:  As evidenced EA:VWUJ inventory rating of 3 or less  4.  Goal(s):  Met:  No  Target date:  As evidenced by:  Attendees: Patient:  [name] 12/12/2011 8:17 AM  Family:     Physician:  Lupe Carney 12/12/2011 8:17 AM   Nursing:       Case Manager:  Richelle Ito, LCSW 12/12/2011 8:17 AM   Counselor:      Other:     Other:     Other:     Other:      Scribe for Treatment Team:   Ida Rogue, 12/12/2011 8:17 AM

## 2011-12-12 NOTE — Progress Notes (Signed)
Recreation Therapy Group Note  Date: 12/12/2011         Time: 1415      Group Topic/Focus: The focus of this group is on discussing various styles of communication and communicating assertively using 'I' (feeling) statements.  Participation Level: Minimal  Participation Quality: Resistant  Affect: Irritable  Cognitive: Oriented   Additional Comments: Prior to group patient became upset with RT for turning the television off. Patient left group to meet with the doctor and did not return.   Irlene Crudup 12/12/2011 4:04 PM

## 2011-12-12 NOTE — Progress Notes (Signed)
[facility] Group Notes:  (Counselor/Nursing/MHT/Case Management/Adjunct)  12/12/2011 6:07 PM   Type of Therapy:  Processing Group at 11:00 am  Participation Level:  Minimal  Participation Quality:  Appropriate  Affect:  Drowsy  Cognitive:  Oriented  Insight:  Limited  Engagement in Group:  Limited  Engagement in Therapy:  Limited  Modes of Intervention:  Exploration and clarification  Summary of Progress/Problems: [name] shared feelings of depression and annoyance in relationship to her diagnosis. [name] also shared that this is in response to what others may think of her diagnosis. Patient became drowsy midpoint of group and may have actually slept some.  [facility] Group Notes:  (Counselor/Nursing/MHT/Case Management/Adjunct)  12/12/2011 6:07 PM   Type of Therapy:  Counseling Group at 1:15 pm  Participation Level:  Minimal  Participation Quality:  Appropriate  Affect:  Distracted  Cognitive:  Oriented  Insight:  Limited  Engagement in Group:  Limited  Engagement in Therapy:  None  Modes of Intervention:  Exploration and education  Summary of Progress/Problems: [name] was attentive during presentation part of group on subject of   chain. Although patient seemed to be processing a lot she shared little. Chem left group admit point and did not return.  Ronda Fairly, LCSWA 12/12/2011 6:07 PM

## 2011-12-12 NOTE — Progress Notes (Signed)
[facility] MD Progress Note  12/12/2011 9:24 PM  Subjective: Patient seen and evaluated.  Pt stable, discussed discharge options.   Objective: Patient stated that her mood was "okay". Her affect was mood congruent and euthymic. She denied any current auditory or visual hallucinations, suicidal ideation or homicidal ideation. She reported that she slept well last night and denied any acute complications. Her speech is normal rate, tone and volume. Her insight is limited and her judgment is fair. Her thought process was linear and goal directed. There is no apparent paranoia or thought disturbance noted. She seems to be doing well and denied any acute complications at this time.   Sleep:  Number of Hours: 6   Vital Signs:Blood pressure 121/78, pulse 77, temperature 97.6 F (36.4 C), temperature source Oral, resp. rate 16, height 5' (1.524 m), weight 51.71 kg (114 lb).  Lab Results: No results found for this or any previous visit (from the past 48 hour(s)).  A/Plan: Continue current meds. Initiating discharge process.  See orders.  Lupe Carney 12/12/2011, 9:24 PM

## 2011-12-12 NOTE — Progress Notes (Signed)
Patient reported that she's doing well. Although she appeared somewhat grumpy and irritable and seemed to keep to herself.allucinations. She denied SI/Hi and denied hallucinations. Q 15 minute check continues to maintain safety.

## 2011-12-12 NOTE — Progress Notes (Signed)
Recreation Therapy Group Note  Date: 12/12/2011         Time: 1000       Group Topic/Focus: Patient invited to participate in animal assisted therapy. Pets as a coping skill and responsibility were discussed.   Participation Level: Minimal  Participation Quality: Drowsy  Affect: Blunted  Cognitive: Appropriate and Oriented   Additional Comments: Patient in and out of group, reports being drowsy, laying on furniture.

## 2011-12-12 NOTE — Progress Notes (Signed)
Pt states she slept fair last night. Appetite is improving, pt rates their depression as a 8, hopelessness as a 7. Pt states she is agitated at times, and has "off and on" thoughts of SI, but contracts for safety. Pt goes to groups/meals.

## 2011-12-13 NOTE — Progress Notes (Signed)
Pt lying in bed with eyes closed.  No distress observed.  Safety maintained with q15 minute checks. 

## 2011-12-13 NOTE — Progress Notes (Signed)
[facility] Group Notes:  (Counselor/Nursing/MHT/Case Management/Adjunct)  12/13/2011 6:04 PM  Type of Therapy:  Processing Group w Counselor at 11:00AM  Participation Level:  Did Not Attend   [name] 12/13/2011, 6:04 PM

## 2011-12-13 NOTE — Progress Notes (Signed)
Pt. Has been pleasant on approach.Pt in bed & up in dayroom reading a book. Continues on 15 minute checks. Pt. Safety maintained

## 2011-12-13 NOTE — Discharge Planning (Addendum)
[name] attended AM group.  Was not in her usual joking mood.  Subdued.  Says she is exploring her options.  Confirmed with her that we had agreed on d/c tomorrow to shelter and follow up at [facility]. Per State Regulation 482.30 This chart was reviewed for medical necessity with respect to the patient's Admission/Duration of stay. Daryel Gerald, [place]  12/13/2011  Next Review Date:  12/15/11

## 2011-12-13 NOTE — Progress Notes (Signed)
Pt states she slept fair, appetite is improving. Energy level is normal. Pt rates his depression at a 7 and hopelessness at a 7. Pt states she has  "off and on" passive SI, but contracts for safety. No prns on this shift 7-3 pm.

## 2011-12-14 MED ORDER — QUETIAPINE FUMARATE 50 MG PO TABS: 50.0000 mg | ORAL_TABLET | Freq: Two times a day (BID) | ORAL | Status: DC

## 2011-12-14 MED ORDER — QUETIAPINE FUMARATE 400 MG PO TABS: 400.0000 mg | ORAL_TABLET | Freq: Every day | ORAL | Status: DC

## 2011-12-14 MED ORDER — QUETIAPINE FUMARATE 400 MG PO TABS: 400.0000 mg | ORAL_TABLET | ORAL | Status: DC

## 2011-12-14 NOTE — Progress Notes (Signed)
Pt resting in bed with eyes closed.  No distress observed.  Safety maintained with q15 minute checks. 
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
## 2011-12-14 NOTE — Progress Notes (Signed)
[name] is a 50 y.o. female [id number] 1961/02/16  Diagnosis:  Axis I: Schizoaffective Disorder and Substance Abuse  Vital Signs:Blood pressure 118/69, pulse 107, temperature 98 F (36.7 C), temperature source Oral, resp. rate 18, height 5' (1.524 m), weight 114 lb (51.71 kg).  Subjective/Objective: [name] is doing well and has no complaints.  She is anticipating d/c soon and is encouraged to consider rehab. She is interested in going to rehab and plans to initiate some calls on her own this afternoon to find a possible placement.  She is praised for taking a proactive approach. Mental Status: Pt is up and active in the unit milieu. General Appearance: Casual Behavior: Cooperative, guarded, uncooperative Eye Contact:  Good Motor Behavior:  Normal Speech:  Regular rate and rhythm, normal volume, coherent and goal directed. Level of Consciousness:  Alert Mood:  Euthymic Affect:  Appropriate Anxiety Level:  None Thought Process:  Linear and organized. Thought Content:  No auditory or visual hallucinations. No evidence of psychosis. Perception:  Normal Judgment:  Good Insight:  Present Cognition:  Much improved since arrival. Sleep:  Number of Hours: 6.75  Appetite: none, improving, as usual, improved  Lab Results:   Medications Scheduled:     . mulitivitamin with minerals  1 tablet Oral Daily  . QUEtiapine  400 mg Oral Custom  . QUEtiapine  50 mg Oral BID     PRN Meds acetaminophen, alum & mag hydroxide-simeth, magnesium hydroxide  Assessment/Plan: Continue current plan of care with no changes at this time.  Anticipated D/C is 1-2 days pending placement.  Rona Ravens. Alondra Vandeven [facility] 12/14/2011

## 2011-12-14 NOTE — Discharge Summary (Signed)
Physician Discharge Summary  Patient ID: [name] MRN: [id number] DOB/AGE: [birth year] 48 y.o.  Admit date: 12/06/2011 Discharge date: 12/14/2011  Discharge Diagnoses:  Axis I: Psychosis NOS, cocaine dependence, cannabis abuse. Axis II: No diagnosis Axis III: No diagnosis Axis IV: Deferred Axis V current 55 past year not known.  Discharged Condition: good  Hospital Course:  [name] was admitted after presenting to our emergency room complaining of suicidal thoughts with some auditory hallucinations without commands. She just returned from [place] about 2 weeks ago and it previously been on our unit about 4 weeks ago. She had stopped taking her Seroquel and had relapsed on cocaine. She was asking for help getting her mood under control a resuming sobriety from cocaine and, maintaining abstinence.  She was admitted for dual diagnosis unit and responded well to resumption of her Seroquel 400 mg by mouth each bedtime, immediate release. Group participation was satisfactory. Significant issues include homelessness and she will be taking a bed at the local shelter. She did not require detox from alcohol or benzodiazepines. By 12/11/11 she was without suicidal thoughts. Insight is somewhat limited but she is in full contact with reality no dangerous ideas. In the voices that she is ready for discharge.  Consults: none  Discharge Exam: Blood pressure 96/60, pulse 78, temperature 98 F (36.7 C), temperature source Oral, resp. rate 19, height 5' (1.524 m), weight 51.71 kg (114 lb).     Discharge Orders    Future Orders Please Complete By Expires   Diet - low sodium heart healthy      Increase activity slowly      Discharge instructions      Comments:   Keep medications out of reach of children and others.   Please keep your follow up appts.  Take your 7 day supply of samples with you.     Medication List  As of 12/14/2011 10:32 AM   START taking these medications         *  QUEtiapine 400 MG tablet   Commonly known as: SEROQUEL   Take 1 tablet (400 mg total) by mouth at bedtime. For clear thoughts and stable mood, and suppress cocaine cravings.     * Notice: This list has 1 medication(s) that are the same as other medications prescribed for you. Read the directions carefully, and ask your doctor or other care provider to review them with you.       CHANGE how you take these medications         * QUEtiapine 50 MG tablet   Commonly known as: SEROQUEL   Take 1 tablet (50 mg total) by mouth 2 (two) times daily. For clear thoughts and suppression of cocaine cravings.   What changed: - how often to take the med - doctor's instructions     * Notice: This list has 1 medication(s) that are the same as other medications prescribed for you. Read the directions carefully, and ask your doctor or other care provider to review them with you.        Where to get your medications    These are the prescriptions that you need to pick up.   You may get these medications from any pharmacy.         QUEtiapine 400 MG tablet   QUEtiapine 50 MG tablet           Follow-up Information    Follow up with [facility] on 12/20/2011. (8:00 for paperwork  8:30 with Dr Dicky Doe)  Contact information:   [address]  [place]  [address]  [[phone]         Signed: Ivan Lacher A 12/14/2011, 10:32 AM

## 2011-12-14 NOTE — Progress Notes (Signed)
[facility] Group Notes:  (Counselor/Nursing/MHT/Case Management/Adjunct)  12/14/2011 9:43 AM   Type of Therapy:  Processing Group at 11:00 am  Participation Level:  None Participation Quality:  Drowsy and left group room   [facility] Group Notes:  (Counselor/Nursing/MHT/Case Management/Adjunct)  12/14/2011 9:43 AM   Type of Therapy:  Counseling Group at 1:15 pm  Participation Level:  None  Participation Quality:  Drowsy then left group   Ronda Fairly, LCSWA 12/14/2011 9:43 AM

## 2011-12-15 DIAGNOSIS — F1420 Cocaine dependence, uncomplicated: Secondary | ICD-10-CM

## 2011-12-15 DIAGNOSIS — F1210 Cannabis abuse, uncomplicated: Secondary | ICD-10-CM

## 2011-12-15 DIAGNOSIS — F29 Unspecified psychosis not due to a substance or known physiological condition: Principal | ICD-10-CM

## 2011-12-15 NOTE — Progress Notes (Signed)
Resting in bed with eyes closed.  Q 15 min. Safety checks being conducted.  Safety maintained

## 2011-12-15 NOTE — Progress Notes (Signed)
Patient ID: [name], female   DOB: 1961-09-24, 50 y.o.   MRN: [id number] Pt eager for d/c this am. Reviewed all d/c instructions and f/u appts.  Pt verbalized understanding.  Rx samples given with instruction.  Denies SI/HI.  Bus pass given. All belongings accounted for and escorted to lobby without incident.

## 2011-12-15 NOTE — Progress Notes (Signed)
Patient ID: [name], female   DOB: 1961-05-09, 50 y.o.   MRN: [id number]  Suicide Risk assessment is completed and [name] has no dangerous ideas.  Discharge was delayed until today due to change in follow-up disposition, she will be going to a shelter in [place].   Will discharge this am with medication Rx, and 7-day supply.

## 2011-12-18 NOTE — Progress Notes (Signed)
Patient Discharge Instructions: no consents to [facility], Barbaraann Share, 12/18/2011, 1:55 PM

## 2012-03-03 ENCOUNTER — Encounter (HOSPITAL_COMMUNITY): Payer: Self-pay | Admitting: *Deleted

## 2012-03-03 ENCOUNTER — Emergency Department (HOSPITAL_COMMUNITY)
Admission: EM | Admit: 2012-03-03 | Discharge: 2012-03-04 | Disposition: A | Discharge status: Other Acute Inpatient Hospital | Payer: Medicare Other | Attending: Emergency Medicine | Admitting: Emergency Medicine

## 2012-03-03 DIAGNOSIS — R45850 Homicidal ideations: Secondary | ICD-10-CM | POA: Insufficient documentation

## 2012-03-03 DIAGNOSIS — S0003XA Contusion of scalp, initial encounter: Secondary | ICD-10-CM | POA: Insufficient documentation

## 2012-03-03 DIAGNOSIS — F1410 Cocaine abuse, uncomplicated: Secondary | ICD-10-CM | POA: Insufficient documentation

## 2012-03-03 DIAGNOSIS — F411 Generalized anxiety disorder: Secondary | ICD-10-CM | POA: Insufficient documentation

## 2012-03-03 DIAGNOSIS — IMO0002 Reserved for concepts with insufficient information to code with codable children: Secondary | ICD-10-CM | POA: Insufficient documentation

## 2012-03-03 LAB — CBC
Hemoglobin: 15.7 g/dL — ABNORMAL HIGH (ref 12.0–15.0)
MCH: 29.7 pg (ref 26.0–34.0)
RBC: 5.29 MIL/uL — ABNORMAL HIGH (ref 3.87–5.11)

## 2012-03-03 LAB — URINE MICROSCOPIC-ADD ON

## 2012-03-03 LAB — COMPREHENSIVE METABOLIC PANEL
ALT: 15 U/L (ref 0–35)
Alkaline Phosphatase: 75 U/L (ref 39–117)
CO2: 30 mEq/L (ref 19–32)
Chloride: 104 mEq/L (ref 96–112)
GFR calc Af Amer: 71 mL/min — ABNORMAL LOW (ref >90)
GFR calc non Af Amer: 61 mL/min — ABNORMAL LOW (ref >90)
Glucose, Bld: 77 mg/dL (ref 70–99)
Potassium: 3.5 mEq/L (ref 3.5–5.1)
Sodium: 141 mEq/L (ref 135–145)
Total Bilirubin: 0.6 mg/dL (ref 0.3–1.2)

## 2012-03-03 LAB — URINALYSIS, ROUTINE W REFLEX MICROSCOPIC
Leukocytes, UA: NEGATIVE
Nitrite: NEGATIVE
Specific Gravity, Urine: 1.025 (ref 1.005–1.030)
Urobilinogen, UA: 0.2 mg/dL (ref 0.0–1.0)
pH: 5 (ref 5.0–8.0)

## 2012-03-03 LAB — RAPID URINE DRUG SCREEN, HOSP PERFORMED: Barbiturates: NOT DETECTED

## 2012-03-03 MED ORDER — ACETAMINOPHEN 325 MG PO TABS
650.0000 mg | ORAL_TABLET | ORAL | Status: DC | PRN
  Administered 2012-03-03: 650 mg via ORAL
  Filled 2012-03-03: qty 2

## 2012-03-03 MED ORDER — LORAZEPAM 1 MG PO TABS
1.0000 mg | ORAL_TABLET | Freq: Three times a day (TID) | ORAL | Status: DC | PRN
  Administered 2012-03-03: 1 mg via ORAL
  Filled 2012-03-03: qty 1

## 2012-03-03 MED ORDER — ONDANSETRON HCL 4 MG PO TABS: 4.0000 mg | ORAL_TABLET | Freq: Three times a day (TID) | ORAL | Status: DC | PRN

## 2012-03-03 MED ORDER — QUETIAPINE FUMARATE 100 MG PO TABS
300.0000 mg | ORAL_TABLET | Freq: Every day | ORAL | Status: DC
  Administered 2012-03-03: 300 mg via ORAL
  Filled 2012-03-03: qty 3

## 2012-03-03 NOTE — ED Notes (Signed)
Greg with act team in to see pt.
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
## 2012-03-03 NOTE — ED Notes (Signed)
ACT Team counselor Tammy Sours assessing patient
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
## 2012-03-04 NOTE — BHH Counselor (Signed)
Pending a Telepsych. Process initiated by SW.

## 2012-03-04 NOTE — ED Notes (Signed)
Given sprite and fruit cup
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
## 2012-03-04 NOTE — BHH Counselor (Signed)
Patient referred to [facility] and accepted by Dr. Jari Sportsman to Unit A. Informed Dr. Lajean Saver of patients disposition and he agrees to place patient up for discharge. Patient's nurse was informed to call report to [phone]. Patient made aware of her disposition and agrees to received in-pt treatment at [facility]. Patient agreeable to signing herself into [facility]. Nurse will continue to make arrangements so that patient is transferred to the facility accordingly.

## 2012-03-04 NOTE — Discharge Instructions (Signed)
Cocaine Abuse Proceed directly to [facility] from the ER. PROBLEMS FROM USING COCAINE:   Highly addictive.   Illegal.   Risk of sudden death.   Heart disease.   Irregular heart beat.   High blood pressure.   Damage to nose and lungs.   Severe agitation.   Hallucinations.   Violent behavior.   Paranoia.   Sexual dysfunction.  Most cocaine users deny that they have a problem with addiction. The biggest problem is admitting that you are dependent on cocaine. Those trying to quit using it may experience depression and withdrawal symptoms. Other withdrawal symptoms include fatigue, suicidal thoughts, sleepiness, restlessness, anxiety, and increased craving for cocaine. There are medications available to help prevent depression associated with stopping cocaine. Most users will find a support group or treatment program helpful in coming off and staying off cocaine. The best chance to cure cocaine addiction is to go into group therapy and to be in a drug-free environment. It is very important to develop healthy relationships and avoid socializing with people who use or deal drugs. Eat well, and give your body the proper rest and healthy exercise it needs. You may need medication to help treat withdrawal symptoms. Call your caregiver or a drug treatment center for more help.  You may also want to call the [facility] on Drug Abuse at 800-662-HELP in the [place] SEEK IMMEDIATE MEDICAL CARE IF:  You develop severe chest pain.   You develop shortness of breath.   You develop extreme agitation.  Document Released: 01/18/2005 Document Revised: 11/30/2011 Document Reviewed: 10/13/2009 [facility] Patient Information 2012 [place], [place].

## 2012-08-30 ENCOUNTER — Emergency Department (HOSPITAL_COMMUNITY)
Admission: EM | Admit: 2012-08-30 | Discharge: 2012-08-31 | Disposition: A | Discharge status: Other Acute Inpatient Hospital | Payer: Medicare Other | Attending: Emergency Medicine | Admitting: Emergency Medicine

## 2012-08-30 ENCOUNTER — Encounter (HOSPITAL_COMMUNITY): Payer: Self-pay

## 2012-08-30 DIAGNOSIS — F17200 Nicotine dependence, unspecified, uncomplicated: Secondary | ICD-10-CM | POA: Insufficient documentation

## 2012-08-30 DIAGNOSIS — F329 Major depressive disorder, single episode, unspecified: Secondary | ICD-10-CM

## 2012-08-30 DIAGNOSIS — F319 Bipolar disorder, unspecified: Secondary | ICD-10-CM | POA: Insufficient documentation

## 2012-08-30 DIAGNOSIS — R45851 Suicidal ideations: Secondary | ICD-10-CM | POA: Insufficient documentation

## 2012-08-30 DIAGNOSIS — Z79899 Other long term (current) drug therapy: Secondary | ICD-10-CM | POA: Insufficient documentation

## 2012-08-30 DIAGNOSIS — F1410 Cocaine abuse, uncomplicated: Secondary | ICD-10-CM

## 2012-08-30 HISTORY — DX: Bipolar disorder, unspecified: F31.9

## 2012-08-30 LAB — CBC
MCH: 29.9 pg (ref 26.0–34.0)
MCHC: 33.8 g/dL (ref 30.0–36.0)
Platelets: 229 10*3/uL (ref 150–400)
RDW: 14.2 % (ref 11.5–15.5)

## 2012-08-30 LAB — RAPID URINE DRUG SCREEN, HOSP PERFORMED
Benzodiazepines: NOT DETECTED
Cocaine: POSITIVE — AB
Opiates: NOT DETECTED
Tetrahydrocannabinol: NOT DETECTED

## 2012-08-30 LAB — COMPREHENSIVE METABOLIC PANEL
ALT: 20 U/L (ref 0–35)
AST: 21 U/L (ref 0–37)
Albumin: 3.4 g/dL — ABNORMAL LOW (ref 3.5–5.2)
Alkaline Phosphatase: 71 U/L (ref 39–117)
Calcium: 9.1 mg/dL (ref 8.4–10.5)
GFR calc Af Amer: 89 mL/min — ABNORMAL LOW (ref >90)
Glucose, Bld: 143 mg/dL — ABNORMAL HIGH (ref 70–99)
Potassium: 3.8 mEq/L (ref 3.5–5.1)
Sodium: 142 mEq/L (ref 135–145)
Total Protein: 6.6 g/dL (ref 6.0–8.3)

## 2012-08-30 LAB — URINALYSIS, ROUTINE W REFLEX MICROSCOPIC
Specific Gravity, Urine: 1.035 — ABNORMAL HIGH (ref 1.005–1.030)
Urobilinogen, UA: 1 mg/dL (ref 0.0–1.0)
pH: 5.5 (ref 5.0–8.0)

## 2012-08-30 LAB — URINE MICROSCOPIC-ADD ON

## 2012-08-30 MED ORDER — ACETAMINOPHEN 325 MG PO TABS: 650.0000 mg | ORAL_TABLET | ORAL | Status: DC | PRN

## 2012-08-30 MED ORDER — QUETIAPINE FUMARATE 300 MG PO TABS
300.0000 mg | ORAL_TABLET | Freq: Every day | ORAL | Status: DC
  Administered 2012-08-30: 300 mg via ORAL
  Filled 2012-08-30: qty 1

## 2012-08-30 MED ORDER — ONDANSETRON HCL 4 MG PO TABS: 4.0000 mg | ORAL_TABLET | Freq: Three times a day (TID) | ORAL | Status: DC | PRN

## 2012-08-30 MED ORDER — QUETIAPINE FUMARATE 50 MG PO TABS: 50.0000 mg | ORAL_TABLET | Freq: Every morning | ORAL | Status: DC

## 2012-08-30 NOTE — ED Notes (Signed)
Report given to Stacey RN.

## 2012-08-30 NOTE — ED Notes (Addendum)
Patient states she was detox from cocaine and is feeling suicidal. Patient states she does not have a plan. Patient states she last used cocaine yesterday.

## 2012-08-30 NOTE — ED Notes (Signed)
Bedside report received from previous RN 

## 2012-08-30 NOTE — ED Provider Notes (Signed)
History     CSN: [id number]  Arrival date & time 08/30/12  1028   First MD Initiated Contact with Patient 08/30/12 1135      Chief Complaint  Patient presents with  . Medical Clearance    wants detox from cocaine  . Suicidal    (Consider location/radiation/quality/duration/timing/severity/associated sxs/prior treatment) The history is provided by the patient.  pt reports she wants detox from cocaine. Also having occasional suicdial thoughts without plan. No hallucinations. No ETOH. No other complaints. Reports noncompliance with her medications.    Past Medical History  Diagnosis Date  . Depression   . Bipolar 1 disorder     History reviewed. No pertinent past surgical history.  Family History  Problem Relation Age of Onset  . Anesthesia problems Neg Hx   . Hypotension Neg Hx   . Malignant hyperthermia Neg Hx   . Pseudochol deficiency Neg Hx     History  Substance Use Topics  . Smoking status: Current Everyday Smoker -- 0.5 packs/day for 10 years    Types: Cigarettes  . Smokeless tobacco: Never Used  . Alcohol Use: Yes     occasionally    OB History    Grav Para Term Preterm Abortions TAB SAB Ect Mult Living                  Review of Systems  All other systems reviewed and are negative.    Allergies  Review of patient's allergies indicates no known allergies.  Home Medications   Current Outpatient Rx  Name Route Sig Dispense Refill  . ADULT MULTIVITAMIN W/MINERALS CH Oral Take 1 tablet by mouth daily.    . QUETIAPINE FUMARATE 300 MG PO TABS Oral Take 300 mg by mouth at bedtime.    Marland Kitchen QUETIAPINE FUMARATE 50 MG PO TABS Oral Take 50 mg by mouth daily.    . QUETIAPINE FUMARATE 400 MG PO TABS Oral Take 1 tablet (400 mg total) by mouth at bedtime. For clear thoughts and stable mood, and suppress cocaine cravings. 30 tablet 0  . QUETIAPINE FUMARATE 50 MG PO TABS Oral Take 1 tablet (50 mg total) by mouth 2 (two) times daily. For clear thoughts and  suppression of cocaine cravings. 60 tablet 0    BP 96/56  Pulse 69  Temp 98.2 F (36.8 C) (Oral)  Resp 16  SpO2 99%  Physical Exam  Nursing note and vitals reviewed. Constitutional: She is oriented to person, place, and time. She appears well-developed and well-nourished. No distress.  HENT:  Head: Normocephalic and atraumatic.  Eyes: EOM are normal.  Neck: Normal range of motion.  Cardiovascular: Normal rate, regular rhythm and normal heart sounds.   Pulmonary/Chest: Effort normal and breath sounds normal.  Abdominal: Soft. She exhibits no distension. There is no tenderness.  Musculoskeletal: Normal range of motion.  Neurological: She is alert and oriented to person, place, and time.  Skin: Skin is warm and dry.  Psychiatric: Her affect is labile. Her speech is not rapid and/or pressured. She is slowed. She expresses suicidal ideation. She expresses no suicidal plans.    ED Course  Procedures (including critical care time)  Labs Reviewed  CBC - Abnormal; Notable for the following:    WBC 3.5 (*)     Hemoglobin 15.1 (*)     All other components within normal limits  COMPREHENSIVE METABOLIC PANEL - Abnormal; Notable for the following:    Glucose, Bld 143 (*)     Albumin 3.4 (*)  GFR calc non Af Amer 77 (*)     GFR calc Af Amer 89 (*)     All other components within normal limits  URINE RAPID DRUG SCREEN (HOSP PERFORMED) - Abnormal; Notable for the following:    Cocaine POSITIVE (*)     All other components within normal limits  URINALYSIS, ROUTINE W REFLEX MICROSCOPIC - Abnormal; Notable for the following:    Color, Urine AMBER (*)  BIOCHEMICALS MAY BE AFFECTED BY COLOR   APPearance CLOUDY (*)     Specific Gravity, Urine 1.035 (*)     Hgb urine dipstick MODERATE (*)     Bilirubin Urine SMALL (*)     Ketones, ur TRACE (*)     Leukocytes, UA TRACE (*)     All other components within normal limits  URINE MICROSCOPIC-ADD ON - Abnormal; Notable for the following:     Squamous Epithelial / LPF FEW (*)     Bacteria, UA FEW (*)     Crystals CA OXALATE CRYSTALS (*)     All other components within normal limits  ETHANOL  ACETAMINOPHEN LEVEL  PREGNANCY, URINE   No results found.   No diagnosis found.    MDM  Vague SI and cocaine abuse. Telepsych to evaluate. ACT aware        Lyanne Co, MD 08/30/12 [id number]

## 2012-08-30 NOTE — ED Provider Notes (Signed)
Psychiatry states not stable for discharge and is a danger to self.  Glynn Octave, MD 08/30/12 [phone]

## 2012-08-30 NOTE — BH Assessment (Addendum)
Assessment Note   [name] is a 51 y.o. female who presents to [facility] voluntarily endorsing SI with no plan and auditory hallucinations. Pt reports precipitating event as a finical stressors that have made it difficult for her to get a refill of Seroquel. Pt states she has been off of her medications for the past 1 month. In that time, she has begun to hear voices and have thought of wanting to hurt herself. She denies HI and VH. She reports 6 past suicide attempts and several inpatient stays for t mental health treatment. She reports a history of depression. She also reports a family history of depression and substance abuse.    Pt also endorses cocaine use 2 times weekly in various amounts. She denies any other substance abuse. She reports she lives with friends in the area. She states she does not feel safe to return home at this time.  Telepsych was completed and recommends inpatient psychiatric treatment.    Axis I: Schizoaffective Disorder Axis II: Deferred Axis III:  Past Medical History  Diagnosis Date  . Depression   . Bipolar 1 disorder    Axis IV: economic problems and other psychosocial or environmental problems Axis V: 31-40 impairment in reality testing  Past Medical History:  Past Medical History  Diagnosis Date  . Depression   . Bipolar 1 disorder     History reviewed. No pertinent past surgical history.  Family History:  Family History  Problem Relation Age of Onset  . Anesthesia problems Neg Hx   . Hypotension Neg Hx   . Malignant hyperthermia Neg Hx   . Pseudochol deficiency Neg Hx     Social History:  reports that she has been smoking Cigarettes.  She has a 5 pack-year smoking history. She has never used smokeless tobacco. She reports that she drinks alcohol. She reports that she uses illicit drugs (Cocaine and "Crack" cocaine).  Additional Social History:  Alcohol / Drug Use History of alcohol / drug use?: No history of alcohol / drug abuse  CIWA:  CIWA-Ar BP: 121/67 mmHg Pulse Rate: 66  COWS:    Allergies: No Known Allergies  Home Medications:  (Not in a hospital admission)  OB/GYN Status:  No LMP recorded. Patient is not currently having periods (Reason: Perimenopausal).  General Assessment Data Location of Assessment: [facility] Living Arrangements: Non-relatives/Friends Can pt return to current living arrangement?: Yes Admission Status: Voluntary Is patient capable of signing voluntary admission?: Yes Transfer from: Acute Hospital Referral Source: MD  Education Status Is patient currently in school?: No  Risk to self Suicidal Ideation: Yes-Currently Present Suicidal Intent: Yes-Currently Present Is patient at risk for suicide?: Yes Suicidal Plan?: No Access to Means: No What has been your use of drugs/alcohol within the last 12 months?: cocaine Previous Attempts/Gestures: Yes How many times?: 6  Other Self Harm Risks: none Triggers for Past Attempts: None known Intentional Self Injurious Behavior: None Family Suicide History: No Recent stressful life event(s): Financial Problems Persecutory voices/beliefs?: No Depression: Yes Depression Symptoms: Loss of interest in usual pleasures;Feeling worthless/self pity;Insomnia Substance abuse history and/or treatment for substance abuse?: Yes Suicide prevention information given to non-admitted patients: Not applicable  Risk to Others Homicidal Ideation: No Thoughts of Harm to Others: No Current Homicidal Intent: No Current Homicidal Plan: No Access to Homicidal Means: No Identified Victim: none History of harm to others?: No Assessment of Violence: None Noted Violent Behavior Description: cooperative Does patient have access to weapons?: No Criminal Charges Pending?: No Does  patient have a court date: No  Psychosis Hallucinations: Auditory Delusions: None noted  Mental Status Report Appear/Hygiene: Disheveled Eye Contact: Fair Motor Activity:  Unremarkable Speech: Logical/coherent Level of Consciousness: Alert Mood: Depressed Affect: Appropriate to circumstance;Depressed Anxiety Level: None Thought Processes: Coherent;Relevant Judgement: Unimpaired Orientation: Person;Place;Time;Situation Obsessive Compulsive Thoughts/Behaviors: None  Cognitive Functioning Concentration: Normal Memory: Recent Intact;Remote Intact IQ: Average Insight: Fair Impulse Control: Poor Appetite: Poor Weight Loss: 0  Weight Gain: 0  Sleep: Decreased Vegetative Symptoms: None  ADLScreening [facility]) Patient's cognitive ability adequate to safely complete daily activities?: Yes Patient able to express need for assistance with ADLs?: Yes Independently performs ADLs?: Yes (appropriate for developmental age)  Abuse/Neglect [facility]) Physical Abuse: Denies Verbal Abuse: Denies Sexual Abuse: Denies  Prior Inpatient Therapy Prior Inpatient Therapy: Yes Prior Therapy Dates: 2012 Prior Therapy Facilty/Provider(s): [facility] and [facility] Reason for Treatment: SI Depression  Prior Outpatient Therapy Prior Outpatient Therapy: Yes Prior Therapy Dates: 2012 Prior Therapy Facilty/Provider(s): [facility] Reason for Treatment: medication management  ADL Screening (condition at time of admission) Patient's cognitive ability adequate to safely complete daily activities?: Yes Patient able to express need for assistance with ADLs?: Yes Independently performs ADLs?: Yes (appropriate for developmental age) Weakness of Legs: None Weakness of Arms/Hands: None  Home Assistive Devices/Equipment Home Assistive Devices/Equipment: None    Abuse/Neglect Assessment (Assessment to be complete while patient is alone) Physical Abuse: Denies Verbal Abuse: Denies Sexual Abuse: Denies Exploitation of patient/patient's resources: Denies Self-Neglect: Denies Values / Beliefs Cultural Requests During Hospitalization: None Spiritual Requests During  Hospitalization: None   Advance Directives (For Healthcare) Advance Directive: Patient does not have advance directive;Patient would not like information Pre-existing out of facility DNR order (yellow form or pink MOST form): No Nutrition Screen- [facility] Patient's home diet: Regular Have you recently lost weight without trying?: No Have you been eating poorly because of a decreased appetite?: No Malnutrition Screening Tool Score: 0   Additional Information 1:1 In Past 12 Months?: No CIRT Risk: No Elopement Risk: No Does patient have medical clearance?: Yes     Disposition:  Disposition Disposition of Patient: Referred to;Inpatient treatment program Type of inpatient treatment program: Adult  On Site Evaluation by:   Reviewed with Physician:     Georgina Quint A 08/30/2012 10:59 PM

## 2012-08-31 ENCOUNTER — Inpatient Hospital Stay (HOSPITAL_COMMUNITY)
Admission: AD | Admit: 2012-08-31 | Discharge: 2012-09-09 | DRG: 885 | Payer: No Typology Code available for payment source | Source: Other Acute Inpatient Hospital | Attending: Psychiatry | Admitting: Psychiatry

## 2012-08-31 ENCOUNTER — Encounter (HOSPITAL_COMMUNITY): Payer: Self-pay

## 2012-08-31 DIAGNOSIS — F1410 Cocaine abuse, uncomplicated: Secondary | ICD-10-CM

## 2012-08-31 DIAGNOSIS — F1910 Other psychoactive substance abuse, uncomplicated: Secondary | ICD-10-CM

## 2012-08-31 DIAGNOSIS — F1999 Other psychoactive substance use, unspecified with unspecified psychoactive substance-induced disorder: Secondary | ICD-10-CM | POA: Diagnosis present

## 2012-08-31 DIAGNOSIS — N390 Urinary tract infection, site not specified: Secondary | ICD-10-CM

## 2012-08-31 DIAGNOSIS — F29 Unspecified psychosis not due to a substance or known physiological condition: Principal | ICD-10-CM

## 2012-08-31 DIAGNOSIS — F1994 Other psychoactive substance use, unspecified with psychoactive substance-induced mood disorder: Secondary | ICD-10-CM

## 2012-08-31 DIAGNOSIS — R443 Hallucinations, unspecified: Secondary | ICD-10-CM

## 2012-08-31 DIAGNOSIS — F17200 Nicotine dependence, unspecified, uncomplicated: Secondary | ICD-10-CM | POA: Diagnosis present

## 2012-08-31 DIAGNOSIS — Z79899 Other long term (current) drug therapy: Secondary | ICD-10-CM

## 2012-08-31 DIAGNOSIS — F1420 Cocaine dependence, uncomplicated: Secondary | ICD-10-CM

## 2012-08-31 DIAGNOSIS — R739 Hyperglycemia, unspecified: Secondary | ICD-10-CM

## 2012-08-31 MED ORDER — ACETAMINOPHEN 325 MG PO TABS: 650.0000 mg | ORAL_TABLET | Freq: Four times a day (QID) | ORAL | Status: DC | PRN

## 2012-08-31 MED ORDER — QUETIAPINE FUMARATE 300 MG PO TABS
300.0000 mg | ORAL_TABLET | Freq: Every day | ORAL | Status: DC
  Filled 2012-08-31 (×2): qty 1

## 2012-08-31 MED ORDER — CEFTRIAXONE SODIUM 1 G IJ SOLR
1.0000 g | Freq: Once | INTRAMUSCULAR | Status: AC
  Administered 2012-08-31: 1 g via INTRAMUSCULAR
  Filled 2012-08-31: qty 10

## 2012-08-31 MED ORDER — LIDOCAINE HCL 1 % IJ SOLN
INTRAMUSCULAR | Status: AC
  Administered 2012-08-31: 20 mL
  Filled 2012-08-31: qty 20

## 2012-08-31 MED ORDER — MAGNESIUM HYDROXIDE 400 MG/5ML PO SUSP: 30.0000 mL | Freq: Every day | ORAL | Status: DC | PRN

## 2012-08-31 MED ORDER — RISPERIDONE 0.5 MG PO TABS
0.5000 mg | ORAL_TABLET | Freq: Every day | ORAL | Status: DC
  Filled 2012-08-31 (×3): qty 1

## 2012-08-31 MED ORDER — ALUM & MAG HYDROXIDE-SIMETH 200-200-20 MG/5ML PO SUSP: 30.0000 mL | ORAL | Status: DC | PRN

## 2012-08-31 NOTE — [facility] Suicide Risk Assessment (Addendum)
Suicide Risk Assessment  Admission Assessment     Nursing information obtained from:  Patient Demographic factors:  Low socioeconomic status;Unemployed Current Mental Status:  Suicidal ideation indicated by patient;Self-harm thoughts Loss Factors:  NA Historical Factors:  Family history of mental illness or substance abuse;Impulsivity, past SI attempts Risk Reduction Factors:  Living with another person, especially a relative;Positive social support  CLINICAL FACTORS:   Alcohol/Substance Abuse/Dependencies  COGNITIVE FEATURES THAT CONTRIBUTE TO RISK:  Closed-mindedness    SUICIDE RISK:   Moderate:  Frequent suicidal ideation with limited intensity, and duration, some specificity in terms of plans, no associated intent, good self-control, limited dysphoria/symptomatology, some risk factors present, and identifiable protective factors, including available and accessible social support.  PLAN OF CARE:  Mental Status Examination/Evaluation:  Objective: Appearance: Disheveled   Psychomotor Activity: Normal   Eye Contact:: Minimal   Speech: Garbled and Normal Rate   Volume: Decreased   Mood: Easily irritated  Affect: Restricted   Thought Process: clear rational goal oriented needs placement   Orientation: Full   Thought Content: Hallucinations: Auditory by her report  Suicidal Thoughts: Yes. without intent/plan   Homicidal Thoughts: No   Judgement: Impaired   Insight: Shallow   DIAGNOSIS:  AXIS I  Cocaine abuse, r/o Suubstance Induced Mood Disorder   AXIS II  Deferred   AXIS III  See medical history.   AXIS IV  economic problems, housing problems, occupational problems, other psychosocial or environmental problems and problems with primary support group   AXIS V  51-60 moderate symptoms   Treatment Plan Summary:  Admit for safety & stabilization  Will order Risperdal as patient is having financial issues regarding Seroquel.  Wants placement -is homeless.  [name] 08/31/2012, 4:05 PM

## 2012-08-31 NOTE — Progress Notes (Signed)
Patient ID: [name], female   DOB: 03-03-1961, 51 y.o.   MRN: [id number]   [facility] Group Notes:  (Counselor/Nursing/MHT/Case Management/Adjunct)  08/31/2012 1:15 PM  Type of Therapy:  Group Therapy, Dance/Movement Therapy   Participation Level:  Did Not Attend     Cassidi Long 08/31/2012. 2:33 PM

## 2012-08-31 NOTE — Progress Notes (Signed)
Adult Psychosocial Assessment Update Interdisciplinary Team  Previous [facility] admissions/discharges:  Admissions Discharges  Date: 11-14-2011 Date: 11-20-2011   Date: Date:  Date: Date:  Date: Date:  Date: Date:   Changes since the last Psychosocial Assessment (including adherence to outpatient mental health and/or substance abuse treatment, situational issues contributing to decompensation and/or relapse). Pt reported that she has been living with friends who do not use and was taking medications appropriately. Pt reported relapsing about a month ago and stopped taking medication because she was frustrated with life and people.              Discharge Plan 1. Will you be returning to the same living situation after discharge?   Yes: X No:      If no, what is your plan?           2. Would you like a referral for services when you are discharged? Yes:  X   If yes, for what services?  No:       Pt is interested in an outpt program.        Summary and Recommendations (to be completed by the evaluator) Recommendations for treatment include crisis stabilization, case management, medication management, psychoeducation to teach coping skills, and group therapy.                        Signature:  Cassidi Long, 08/31/2012 3:59 PM

## 2012-08-31 NOTE — H&P (Signed)
Psychiatric Admission Assessment Adult  Patient Identification:  [name] Date of Evaluation:  08/31/2012 51yo SAAF CC: Wants detox cocaine and having occasional suicidal thoughts without a plan.  History of Present Illness:: Noncompliant with meds relapsed in June.  Presented to [facility] voluntarily c/o SI and AH. Says that due to financial issues hasn't refilled her Seroquel. She reports prior suicide attempts and hospitalizations. Using cocaine twice a week. Has been living with friends but does not feel safe to return there.   Past Psychiatric History: Last here 12/12-12/20/12 for Psychosis NOS cocaine dep and THC abuse   Substance Abuse History: For years.  Social History:    reports that she has been smoking Cigarettes.  She has a 5 pack-year smoking history. She has never used smokeless tobacco. She reports that she drinks alcohol. She reports that she uses illicit drugs (Cocaine and "Crack" cocaine). HS class of 1982. Never married no children has received SSDI for 13 years .  Family Psych History: Denies  Past Medical History:     Past Medical History  Diagnosis Date  . Depression   . Bipolar 1 disorder       No past surgical history on file.  Allergies: No Known Allergies  Current Medications:  Prior to Admission medications   Medication Sig Start Date End Date Taking? Authorizing Provider  Multiple Vitamin (MULTIVITAMIN WITH MINERALS) TABS Take 1 tablet by mouth daily.    Historical Provider, MD  QUEtiapine (SEROQUEL) 300 MG tablet Take 300 mg by mouth at bedtime.    Historical Provider, MD  QUEtiapine (SEROQUEL) 400 MG tablet Take 1 tablet (400 mg total) by mouth at bedtime. For clear thoughts and stable mood, and suppress cocaine cravings. 12/14/11 01/13/12  Viviann Spare, FNP  QUEtiapine (SEROQUEL) 50 MG tablet Take 1 tablet (50 mg total) by mouth 2 (two) times daily. For clear thoughts and suppression of cocaine cravings. 12/14/11 01/13/12  Viviann Spare,  FNP  QUEtiapine (SEROQUEL) 50 MG tablet Take 50 mg by mouth daily.    Historical Provider, MD    Mental Status Examination/Evaluation: Objective:  Appearance: Disheveled  Psychomotor Activity:  Normal  Eye Contact::  Minimal  Speech:  Garbled and Normal Rate  Volume:  Decreased  Mood:  Easily irritated -doesn't readily participate in interview  Affect:  Restricted  Thought Process: clear rational goal oriented needs placement    Orientation:  Full  Thought Content:  Hallucinations: Auditory by her report does not appear to respond to internal stimuli  Suicidal Thoughts:  Yes.  without intent/plan  Homicidal Thoughts:  No  Judgement:  Impaired  Insight:  Shallow    DIAGNOSIS:    AXIS I Substance Abuse and Substance Induced Mood Disorder  AXIS II Deferred  AXIS III See medical history.  AXIS IV economic problems, housing problems, occupational problems, other psychosocial or environmental problems and problems with primary support group  AXIS V 51-60 moderate symptoms     Treatment Plan Summary: Admit for safety & stabilization Will order Risperdal as patient is having financial issues regarding Seroquel. Wants placement -is homeless. Agree with H&P from ED.

## 2012-08-31 NOTE — Progress Notes (Signed)
[facility] Adult Inpatient Family/Significant Other Suicide Prevention Education  Suicide Prevention Education:  Patient Refusal for Family/Significant Other Suicide Prevention Education: The patient [name] has refused to provide written consent for family/significant other to be provided Family/Significant Other Suicide Prevention Education during admission and/or prior to discharge.  Physician notified.  Pt. accepted information on suicide prevention, warning signs to look for with suicide and crisis line numbers to use. The pt. agreed to call crisis line numbers if having warning signs or having thoughts of suicide.    [facility] 08/31/2012, 4:07 PM

## 2012-08-31 NOTE — Progress Notes (Signed)
D: Pt SI but contracts for safety. Pt is not attending any groups. Pt rates depression at a 6 and Helplessness/hopelessness at a 4. Pt is restless and states she may be able to go to group this evening.   A: Pt was offered support and encouragement. Pt was given scheduled medications. Pt was encourage to attend groups. Q 15 minute checks were done for safety.  R: Pt is taking medication. Pt has no complaints.Pt receptive to treatment and safety maintained on unit.

## 2012-08-31 NOTE — ED Notes (Signed)
Received a call from [facility] RN that the MD is willing to accept her as long as she is treated for her UTI before she is transferred. Dr Dierdre Highman notified.

## 2012-08-31 NOTE — Progress Notes (Signed)
Patient did not attend this evenings speaker AA meeting.  

## 2012-08-31 NOTE — H&P (Signed)
  Pt was seen by me today and I agree with the key elements documented in H&P.  

## 2012-08-31 NOTE — Progress Notes (Signed)
Patient ID: [name], female   DOB: 1961/01/15, 51 y.o.   MRN: [id number]   Patient is a 51yr old voluntary admission that came into ED last night as a walk in requesting detox from cocaine and was having some SI. Reports increased depression due to being off her medications. Wants to get back on her Seroquel which was given in ED last night. Does report having some auditory hallucinations at times to harm self but contracts for safety here. Drug screen was positive for cocaine. Did test positive for a UTI which she got Rocephin IM for at ED. Patient is a smoker but doesn't want a patch or gum at this time. Denies any medical issues on admission. Cooperative with admission process.

## 2012-08-31 NOTE — Progress Notes (Signed)
Patient ID: [name], female   DOB: [birth year], 51 y.o.   MRN: [id number]  Pt did not attend aftercare planning group.

## 2012-08-31 NOTE — ED Notes (Signed)
Report called to [facility] RN, Victorino Dike

## 2012-08-31 NOTE — Tx Team (Signed)
Initial Interdisciplinary Treatment Plan  PATIENT STRENGTHS: (choose at least two) Ability for insight Active sense of humor Average or above average intelligence Capable of independent living Communication skills Financial means Motivation for treatment/growth Physical Health Supportive family/friends  PATIENT STRESSORS: Medication change or noncompliance Substance abuse   PROBLEM LIST: Problem List/Patient Goals Date to be addressed Date deferred Reason deferred Estimated date of resolution  Cocaine abuse 9/7     Depression 9/7                                                DISCHARGE CRITERIA:  Ability to meet basic life and health needs Adequate post-discharge living arrangements Reduction of life-threatening or endangering symptoms to within safe limits Withdrawal symptoms are absent or subacute and managed without 24-hour nursing intervention  PRELIMINARY DISCHARGE PLAN: Outpatient therapy Return to previous living arrangement  PATIENT/FAMIILY INVOLVEMENT: This treatment plan has been presented to and reviewed with the patient, [name], and/or family member, .  The patient and family have been given the opportunity to ask questions and make suggestions.  Manuela Schwartz [facility] 08/31/2012, 6:15 AM

## 2012-08-31 NOTE — Progress Notes (Signed)
Psychoeducational Group Note  Date:  08/31/2012 Time:  1515  Group Topic/Focus:  Healthy Communication:   The focus of this group is to discuss communication, barriers to communication, as well as healthy ways to communicate with others.  Participation Level:  Did Not Attend  Dalia Heading 08/31/2012, 6:43 PM

## 2012-09-01 DIAGNOSIS — F29 Unspecified psychosis not due to a substance or known physiological condition: Principal | ICD-10-CM

## 2012-09-01 LAB — URINE CULTURE

## 2012-09-01 MED ORDER — ENSURE COMPLETE PO LIQD
237.0000 mL | Freq: Two times a day (BID) | ORAL | Status: DC
  Administered 2012-09-02 – 2012-09-08 (×10): 237 mL via ORAL

## 2012-09-01 MED ORDER — QUETIAPINE FUMARATE 200 MG PO TABS
200.0000 mg | ORAL_TABLET | Freq: Every day | ORAL | Status: DC
  Administered 2012-09-01 – 2012-09-02 (×2): 200 mg via ORAL
  Filled 2012-09-01 (×4): qty 1

## 2012-09-01 MED ORDER — QUETIAPINE FUMARATE 50 MG PO TABS
50.0000 mg | ORAL_TABLET | Freq: Every day | ORAL | Status: DC
  Administered 2012-09-01 – 2012-09-05 (×5): 50 mg via ORAL
  Filled 2012-09-01 (×8): qty 1

## 2012-09-01 NOTE — Progress Notes (Signed)
Psychoeducational Group Note  Date:  09/01/2012 Time:  1515  Group Topic/Focus:  Conflict Resolution:   The focus of this group is to discuss the conflict resolution process and how it may be used upon discharge.  Participation Level:  Did Not Attend Dalia Heading 09/01/2012, 6:59 PM

## 2012-09-01 NOTE — Progress Notes (Signed)
D.  Pt in bed most of shift but did awaken to take HS medications and to eat a snack.  Pt did not attend evening AA group.  Denies SI/HI/hallucinations at this time.  No complaints voiced.  A.  Support and encouragement offered.  R.  Will continue to monitor. No acute distress noted.
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
## 2012-09-01 NOTE — Progress Notes (Signed)
Patient ID: [name], female   DOB: 02-Jan-1961, 51 y.o.   MRN: [id number]  Pt did not attend aftercare planning group but did receive a workbook.

## 2012-09-01 NOTE — Progress Notes (Signed)
Patient ID: [name], female   DOB: [birth year], 51 y.o.   MRN: [id number]   [facility] Group Notes:  (Counselor/Nursing/MHT/Case Management/Adjunct)  09/01/2012 1:15 PM  Type of Therapy:  Group Therapy, Dance/Movement Therapy   Participation Level:  None  Participation Quality:  Appropriate  Affect:  Appropriate  Cognitive:  Appropriate  Insight:  Limited  Engagement in Group:  None  Engagement in Therapy:  None  Modes of Intervention:  Clarification, Problem-solving, Role-play, Socialization and Support  Summary of Progress/Problems: Therapist and group members discussed the idea of control and how to exert our power to change our circumstances. Group members discussed problems they are currently dealing with and problems they will face at discharge and how they will use their power of control to combat these issues. Pt joined towards the end of group but did not share.     Cassidi Long 09/01/2012. 3:02 PM

## 2012-09-01 NOTE — Progress Notes (Signed)
D has been in bed most of day sleeping, up for meals and then back to bed, did not attend group, states she sleeps poorly at nite, denies HI but contracts for SI, has no plan, endorses AH but she does not understand the voices. Complaining about not taking/giving meds as noon today, was informed 3 times to come to the window but she never came, did give her the seroquel and instructed to come and talk to nurse or Mht if she is having problems. A q44minn safety checks continue and support offered, encouraged her to attend group states she is not going to group at this time, just wants the voices to go away, seroquel helps w/the voices R patient remains safe on the unit, does not nor will not take any other meds besides seroquel.

## 2012-09-01 NOTE — Progress Notes (Signed)
Psychoeducational Group Note  Date:  09/01/2012 Time:  1030  Group Topic/Focus:  Spirituality:   The focus of this group is to discuss how one's spirituality can aide in recovery.  Participation Level:  Did Not Attend  Participation Quality:  Did not attend  Affect:  Did not attend  Cognitive:  Did not attend  Insight:  Did not attend  Engagement in Group:  Did not attend  Additional Comments:    Roselee Culver 09/01/2012, 11:23 AM

## 2012-09-01 NOTE — Progress Notes (Signed)
Patient ID: [name], female   DOB: [birth year], 51 y.o.   MRN: [id number] Has been in her room mostly spending the evening in bed, didn't want to come out for group or snacks.  Stated was tired, hadn't been sleeping and didn't want to come out.  Affect is flat, sad, poor eye contact.  Refused hs risperdal, stated "I'm not taking that".  Closed her eyes and turned over in bed.  Will continue to monitor for safety.

## 2012-09-02 MED ORDER — TRAZODONE HCL 50 MG PO TABS
50.0000 mg | ORAL_TABLET | Freq: Every evening | ORAL | Status: DC | PRN
  Filled 2012-09-02: qty 28

## 2012-09-02 NOTE — Progress Notes (Signed)
D. Pt. Reports voices telling her that she needs to leave here and she needs to go and get high.  Pt. Is ignoring the voices.  Denies SI/HI A.  Encouraged pt. To attend group. R.  Pt. Did not attend group.

## 2012-09-02 NOTE — Progress Notes (Signed)
Patient did not attend this evenings speaker AA meeting.  
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
## 2012-09-02 NOTE — Progress Notes (Signed)
[facility] Group Notes:  (Counselor/Nursing/MHT/Case Management/Adjunct)  09/02/2012   Type of Therapy:  Group Therapy at 1:15 to 2:30 PM  Participation Level:  Did Not Attend  [name] 09/02/2012, 4:00 PM

## 2012-09-02 NOTE — Discharge Planning (Signed)
[name] refused to come to AM group, stating "I am still mad at you from the last time."  Found her in bed at 11:45, awake, engageable.  Said she could work with me "because I really don't have any case management needs."  States she has a safe place to go at d/c, but won't tell me where.  Will follow up at [facility].

## 2012-09-02 NOTE — Progress Notes (Signed)
Patient ID: [name], female   DOB: 1961-03-18, 51 y.o.   MRN: [id number] She has been in bed today appears to be sleeping, up for meals and medication then back to bed. Refused to fill out self inventory. Has refused to go to groups. Will not talk to you except for a couble of words.

## 2012-09-02 NOTE — Progress Notes (Signed)
Psychoeducational Group Note  Date:  09/02/2012 Time:  1100  Group Topic/Focus:  Self Care:   The focus of this group is to help patients understand the importance of self-care in order to improve or restore emotional, physical, spiritual, interpersonal, and financial health.  Participation Level:  Active  Participation Quality:  Appropriate, Attentive and Sharing  Affect:  Appropriate  Cognitive:  Appropriate  Insight:  Good  Engagement in Group:  Good  Additional Comments:  Pt was appropriate and attentive while attending group. Pt was willing to complete worksheet during group and communicated that she likes to take mini vacation and a very curious individual.   Sharyn Lull 09/02/2012, 1:12 PM

## 2012-09-03 MED ORDER — QUETIAPINE FUMARATE 200 MG PO TABS
200.0000 mg | ORAL_TABLET | Freq: Every day | ORAL | Status: AC
  Administered 2012-09-03: 200 mg via ORAL
  Filled 2012-09-03: qty 1

## 2012-09-03 MED ORDER — QUETIAPINE FUMARATE 100 MG PO TABS
100.0000 mg | ORAL_TABLET | Freq: Every day | ORAL | Status: AC
  Administered 2012-09-03: 100 mg via ORAL
  Filled 2012-09-03 (×2): qty 1

## 2012-09-03 MED ORDER — QUETIAPINE FUMARATE 400 MG PO TABS
400.0000 mg | ORAL_TABLET | Freq: Every day | ORAL | Status: DC
  Administered 2012-09-04 – 2012-09-08 (×5): 400 mg via ORAL
  Filled 2012-09-03 (×6): qty 1

## 2012-09-03 NOTE — Progress Notes (Signed)
Patient resting quietly with eyes closed. Respirations even and unlabored. No distress noted. Q 15 minute check continues to maintain safety   

## 2012-09-03 NOTE — Progress Notes (Signed)
D:Pt. Continues to rant b/c her seroquel was just increased to 300 mg  this PM.Pt is quite fidgety & labile ;cursing regarding her need for more seroquel , which pt states helps her manic behaviors. A: Pt was advised that she gets 400 mg. of seroquel starting 09/04/12.Continues on 15 minute checks.R: Pt. Safety maintained.

## 2012-09-03 NOTE — Progress Notes (Signed)
[facility] Group Notes:  (Counselor/Nursing/MHT/Case Management/Adjunct)  09/03/2012 5:28 PM  Type of Therapy:  Psychoeducational Skills  Participation Level:  Minimal  Participation Quality:  Drowsy, Redirectable and Resistant  Affect:  Blunted  Cognitive:  Appropriate and Oriented  Insight:  Limited  Engagement in Group:  Limited  Engagement in Therapy:  n/a  Modes of Intervention:  Activity, Education, Limit-setting, Problem-solving, Socialization and Support  Summary of Progress/Problems: [name] attended psycho education group that focused in using quality time with support systems/individuals to engage in healthy coping skills and stregthen their healthy support system. [name] participated with encouragement in activity guessing about self and peers. [name] was sitting with head down, appearing to fall asleep while group discussed who their supports are, how they can spend quality time with them as a coping skill and a way to strengthen the relationship. [name] was given a homework assignment to find two ways to improve their support systems and twenty activities they can do to spend quality time with supports.   [name] 09/03/2012, 5:28 PM

## 2012-09-03 NOTE — Progress Notes (Signed)
09/03/2012         Time: 1500      Group Topic/Focus: The focus of the group is on enhancing the patients' ability to utilize positive relaxation strategies by practicing several that can be used at discharge.  Participation Level: Did not attend  Participation Quality: Not Applicable  Affect: Not Applicable  Cognitive: Not Applicable   Additional Comments: Patient refused group.   Shermar Friedland 09/03/2012 3:46 PM  

## 2012-09-03 NOTE — Treatment Plan (Signed)
Interdisciplinary Treatment Plan Update (Adult)  Date: 09/03/2012  Time Reviewed: 8:18 AM   Progress in Treatment: Attending groups: Yes Participating in groups: Yes Taking medication as prescribed: Yes Tolerating medication: Yes   Family/Significant other contact made:  No Patient understands diagnosis:  Yes  As evidenced by asking for help with mood, psychosis and substance abuse Discussing patient identified problems/goals with staff:  Yes  See below Medical problems stabilized or resolved:  Yes Denies suicidal/homicidal ideation: No  On and off according to self inventory Issues/concerns per patient self-inventory:  Yes  Poor sleep  Depression and hopelessness are 7's  C/O cravings and agitation  Other:  New problem(s) identified: N/A  Reason for Continuation of Hospitalization: Depression Medication stabilization Suicidal ideation  Interventions implemented related to continuation of hospitalization:  Medication adjustment for meds, sleep  Encourage group attendance and participation  Additional comments:  Estimated length of stay: 1-2 days  Discharge Plan: return home, follow up outpt  New goal(s): N/A  Review of initial/current patient goals per problem list:   1.  Goal(s):Eliminate SI  Met:  No  Target date:9/11  As evidenced WN:UUVO report  2.  Goal (s):Stabilize mood  Met:  No  Target date:9/11  As evidenced [id number] rating of 4 or less  3.  Goal(s):Identify comprehensive sobriety plan  Met:  No  Target date:9/11  As evidenced VZ:DGLO report  4.  Goal(s):  Met:  Yes  Target date:  As evidenced by:  Attendees: Patient: [name]  09/03/2012 8:18 AM  Family:     Physician:  Lupe Carney 09/03/2012 8:18 AM   Nursing:    09/03/2012 8:18 AM   Case Manager:  Richelle Ito, LCSW 09/03/2012 8:18 AM   Counselor:   09/03/2012 8:18 AM   Other:     Other:     Other:     Other:      Scribe for Treatment Team:   Ida Rogue,  09/03/2012 8:18 AM
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
## 2012-09-03 NOTE — Discharge Planning (Addendum)
[name] attended AM group.  C/O not getting Seroquel at 400 dose.  Grumpy.  Not disruptive.  Sleeping by the end of group.

## 2012-09-03 NOTE — Progress Notes (Signed)
Psychoeducational Group Note  Date:  09/03/2012 Time:  1100  Group Topic/Focus:  Recovery Goals:   The focus of this group is to identify appropriate goals for recovery and establish a plan to achieve them.  Participation Level: Did Not Attend  Participation Quality:  Not Applicable  Affect:  Not Applicable  Cognitive:  Not Applicable  Insight:  Not Applicable  Engagement in Group: Not Applicable  Additional Comments:  Pt did not attend group but remained lying in bed.   Sharyn Lull 09/03/2012, 1:33 PM

## 2012-09-03 NOTE — B[facility] Counselor (Signed)
[facility] Group Notes:  (Counselor/Nursing/MHT/Case Management/Adjunct)  09/03/2012   Type of Therapy:  Group Therapy at 1:15 to 2:30  Participation Level:  Minimal  Participation Quality:  Attentive  Affect:  Flat  Cognitive:  Oriented  Insight:  None noted  Engagement in Group:  None  Engagement in Therapy:  None  Modes of Intervention:  Education and Support  Summary of Progress/Problems: Patient attended group presentation by Interior and spatial designer of  [employer]  ([employer]). [name] was somewhat attentive  during session.     [name] 09/03/2012, 3:52 PM

## 2012-09-04 DIAGNOSIS — N390 Urinary tract infection, site not specified: Secondary | ICD-10-CM

## 2012-09-04 DIAGNOSIS — R739 Hyperglycemia, unspecified: Secondary | ICD-10-CM
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
## 2012-09-04 NOTE — Progress Notes (Signed)
Read and reviewed, will discuss with team.
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
## 2012-09-04 NOTE — BHH Counselor (Signed)
[facility] Group Notes:  (Counselor/Nursing/MHT/Case Management/Adjunct)  09/04/2012   Type of Therapy:  Group Therapy at 1:15 to 2:30 PM  Participation Level:  Active  Participation Quality:  Appropriate  Affect:  Flat  Cognitive:  Appropriate  Insight:  Limited  Engagement in Group:  Good  Engagement in Therapy:  Limited  Modes of Intervention:  Activity, Clarification, Socialization and Support  Summary of Progress/Problems: The focus of this group session was to process how we deal with difficult emotions and share with others the patterns that play out when we are reacting to the emotion verses the situation.  [name] shared that one of the most difficult emotions she deals with is 'darkness and dispair.' [name] also shared she would like more feelings of energy and light in her life and processed that these feelings would be more likely felt in recovery vs active addiction.    [name] 09/04/2012, 4:51 PM

## 2012-09-04 NOTE — Progress Notes (Signed)
Patient ID: [name], female   DOB: 01/16/1961, 51 y.o.   MRN: [id number] She has been up and about more today. Interacting with peers and staff. Denies thoughts of SI and w/d symptoms. Her appearance has improved  Has clean clothed on and her hair is fixed.

## 2012-09-04 NOTE — Progress Notes (Signed)
Discussed with team, agree with assessment and plan.  Read and reviewed.

## 2012-09-05 LAB — BASIC METABOLIC PANEL
BUN: 13 mg/dL (ref 6–23)
Calcium: 9.1 mg/dL (ref 8.4–10.5)
Creatinine, Ser: 0.89 mg/dL (ref 0.50–1.10)
GFR calc Af Amer: 87 mL/min — ABNORMAL LOW (ref >90)
GFR calc non Af Amer: 75 mL/min — ABNORMAL LOW (ref >90)
Potassium: 4.2 mEq/L (ref 3.5–5.1)

## 2012-09-05 LAB — URINE MICROSCOPIC-ADD ON

## 2012-09-05 LAB — URINALYSIS, ROUTINE W REFLEX MICROSCOPIC
Bilirubin Urine: NEGATIVE
Nitrite: NEGATIVE
Specific Gravity, Urine: 1.026 (ref 1.005–1.030)
Urobilinogen, UA: 0.2 mg/dL (ref 0.0–1.0)

## 2012-09-05 MED ORDER — FLUCONAZOLE 50 MG PO TABS
150.0000 mg | ORAL_TABLET | Freq: Once | ORAL | Status: AC
  Administered 2012-09-05: 150 mg via ORAL
  Filled 2012-09-05: qty 3

## 2012-09-05 MED ORDER — QUETIAPINE FUMARATE 50 MG PO TABS
50.0000 mg | ORAL_TABLET | Freq: Two times a day (BID) | ORAL | Status: DC
  Administered 2012-09-05 – 2012-09-08 (×7): 50 mg via ORAL
  Filled 2012-09-05 (×10): qty 1

## 2012-09-05 NOTE — Progress Notes (Signed)
Patient did attend the evening karaoke group.  

## 2012-09-05 NOTE — Progress Notes (Signed)
[facility] MD Progress Note  09/05/2012 4:26 PM  S/O: Patient seen and evaluated. Chart reviewed. Patient stated that her mood was "ok". Her affect was mood congruent and euthymic. She denied any current thoughts of self injurious behavior, suicidal ideation or homicidal ideation. Continued c/o AH, improved on Seroquel. There were no visual hallucinations, paranoia, delusional thought processes, or mania noted.  Thought process was linear and goal directed.  Speech was normal rate, tone and volume. Eye contact was good. Judgment and insight are fair.  Patient has been up and engaged on the unit.  No acute safety concerns reported from team.  Pt requesting residential Tx.  Sleep:  Number of Hours: 6.75    Vital Signs:Blood pressure 105/66, pulse 71, temperature 97.9 F (36.6 C), temperature source Oral, resp. rate 20.  Current Medications:    . feeding supplement  237 mL Oral BID BM  . fluconazole  150 mg Oral Once  . QUEtiapine  400 mg Oral QHS  . QUEtiapine  50 mg Oral BID  . DISCONTD: QUEtiapine  50 mg Oral Daily    Lab Results:  Results for orders placed during the hospital encounter of 08/31/12 (from the past 48 hour(s))  URINALYSIS, ROUTINE W REFLEX MICROSCOPIC     Status: Abnormal   Collection Time   09/05/12  5:00 AM      Component Value Range Comment   Color, Urine YELLOW  YELLOW    APPearance CLEAR  CLEAR    Specific Gravity, Urine 1.026  1.005 - 1.030    pH 5.5  5.0 - 8.0    Glucose, UA NEGATIVE  NEGATIVE mg/dL    Hgb urine dipstick TRACE (*) NEGATIVE    Bilirubin Urine NEGATIVE  NEGATIVE    Ketones, ur NEGATIVE  NEGATIVE mg/dL    Protein, ur NEGATIVE  NEGATIVE mg/dL    Urobilinogen, UA 0.2  0.0 - 1.0 mg/dL    Nitrite NEGATIVE  NEGATIVE    Leukocytes, UA NEGATIVE  NEGATIVE   URINE MICROSCOPIC-ADD ON     Status: Abnormal   Collection Time   09/05/12  5:00 AM      Component Value Range Comment   Squamous Epithelial / LPF RARE  RARE    WBC, UA 0-2  <3 WBC/hpf    Bacteria, UA  FEW (*) RARE    Crystals CA OXALATE CRYSTALS (*) NEGATIVE   BASIC METABOLIC PANEL     Status: Abnormal   Collection Time   09/05/12  6:40 AM      Component Value Range Comment   Sodium 141  135 - 145 mEq/L    Potassium 4.2  3.5 - 5.1 mEq/L    Chloride 105  96 - 112 mEq/L    CO2 31  19 - 32 mEq/L    Glucose, Bld 102 (*) 70 - 99 mg/dL    BUN 13  6 - 23 mg/dL    Creatinine, Ser 4.09  0.50 - 1.10 mg/dL    Calcium 9.1  8.4 - 81.1 mg/dL    GFR calc non Af Amer 75 (*) >90 mL/min    GFR calc Af Amer 87 (*) >90 mL/min     Physical Findings: AIMS: Facial and Oral Movements Muscles of Facial Expression: None, normal Lips and Perioral Area: None, normal Jaw: None, normal Tongue: None, normal,Extremity Movements Upper (arms, wrists, hands, fingers): None, normal Lower (legs, knees, ankles, toes): None, normal, Trunk Movements Neck, shoulders, hips: None, normal, Overall Severity Severity of abnormal movements (highest score  from questions above): None, normal Incapacitation due to abnormal movements: None, normal Patient's awareness of abnormal movements (rate only patient's report): No Awareness, Dental Status Current problems with teeth and/or dentures?: No Does patient usually wear dentures?: No  CIWA:  CIWA-Ar Total: 1   A/P: Pt seen and evaluated.  Reviewed short term and long term goals, medications, current treatment in the hospital and acute/chronic safety.  Pt denied any current thoughts of self harm, suicidal ideation or homicidal ideation.  Contracted for safety on the unit. VS reviewed with team.  Pt agreeable with treatment plan, see orders. Continue current medications as noted above, will discuss dispo with team.  [name] 09/05/2012, 4:26 PM
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
## 2012-09-05 NOTE — Progress Notes (Signed)
Pt attended discharge planning group and actively participated in group.  SW provided pt with today's workbook.  Pt presents with calm mood and affect.  Pt rates depression and anxiety at a 5 today.  Pt denies SI/HI.  Pt is working on finding a shelter opening to stay at until she moves to [place] at the end of September.  Pt is scheduled to follow up at [facility] for medication management and therapy.  No further needs voiced by pt at this time.  Safety planning and suicide prevention discussed.  Pt participated in discussion and acknowledged an understanding of the information provided.       Kelvin Sennett [name], LCSWA 09/05/2012  11:00 AM

## 2012-09-05 NOTE — Progress Notes (Signed)
[facility] Group Notes:  (Counselor/Nursing/MHT/Case Management/Adjunct)   Type of Therapy:  Group Therapy 1:15-2:30 PM 09/05/2012   Participation Level:  Minimal  Participation Quality:  Inattentive  Affect:  Flat  Cognitive:  Oriented  Insight:  None shared  Engagement in Group:  None  Engagement in Therapy:  None  Modes of Intervention:  Orientation and Support  Summary of Progress/Problems:Focus of group processing discussion was on balance in life; the components in life which have a negative influence on balance and the components which make for a more balanced life.  [name] left group room early in discussion about what makes up a balanced life and did not return.    [name]

## 2012-09-06 NOTE — Progress Notes (Signed)
Pt reports that she has had voices for "a long time." The voices are getting lower and less intense. She reports that the racing thoughts are decreasing also with medication adjustment. Supported pt to express feelings. Gave medications as ordered and 15 minute checks. Pt denies si and hi. Safety maintained on unit.

## 2012-09-06 NOTE — Progress Notes (Signed)
Psychoeducational Group Note  Date:  09/06/2012 Time:  1030  Group Topic/Focus:  Relapse Prevention Planning:   The focus of this group is to define relapse and discuss the need for planning to combat relapse.  Participation Level:  None  Participation Quality:  Appropriate, Drowsy and Resistant  Affect:  Appropriate and Flat  Cognitive:  Appropriate and Oriented  Insight:  pt did not participate in group  Engagement in Group:  None  Additional Comments:  Pt attended group but did not participate.   Dalia Heading 09/06/2012, 12:34 PM

## 2012-09-06 NOTE — Progress Notes (Signed)
D  Complained of no sleep last nite, appetite is good and going to DR for meals, energy level is normal, ability to pay attention is good, depressed 2/10 WD s/s of tremors, chilling and agitation, denies Si or HI, wants to get back to going to NA meetings and attend OP program A q11min safety checks continue and support offered, attending group and participating, taking meds as ordered R patient remains safe on the hall

## 2012-09-06 NOTE — Tx Team (Signed)
Interdisciplinary Treatment Plan Update (Adult)  Date:  09/06/2012  Time Reviewed:  9:37 AM   Progress in Treatment: Attending groups: Yes Participating in groups:  Yes Taking medication as prescribed: Yes Tolerating medication:  Yes Family/Significant othe contact made:  No Patient understands diagnosis:  Yes Discussing patient identified problems/goals with staff:  Yes Medical problems stabilized or resolved:  Yes Denies suicidal/homicidal ideation: Yes Issues/concerns per patient self-inventory:  None identified Other: N/A  New problem(s) identified: None Identified  Reason for Continuation of Hospitalization: Anxiety Depression Hallucinations Medication stabilization Withdrawal symptoms  Interventions implemented related to continuation of hospitalization: mood stabilization, medication monitoring and adjustment, group therapy and psycho education, safety checks q 15 mins  Additional comments: N/A  Estimated length of stay: 2-3 days  Discharge Plan: SW assessing for appropriate referrals.    New goal(s): N/A  Review of initial/current patient goals per problem list:    1. Goal(s):Eliminate SI  Met: Yes  Target date:9/11  As evidenced ZO:XWRU report  2. Goal (s):Stabilize mood  Met: No  Target date:9/11  As evidenced EA:[id number] rating of 4 or less, pt rates at a 4 today but continues to hear voices.   3. Goal(s):Identify comprehensive sobriety plan  Met: No  Target date:9/11  As evidenced NW:GNFA report, pt is open to further treatment   Attendees: Patient:  [name]  09/06/2012 9:37 AM   Family:     Physician:  Lupe Carney, DO 09/06/2012 9:37 AM   Nursing: Manuela Schwartz, RN 09/06/2012 9:37 AM   Case Manager:  Reyes Ivan, LCSWA 09/06/2012  9:37 AM   Counselor:  Ronda Fairly, LCSWA 09/06/2012  9:37 AM   Other:  Richelle Ito, LCSW 09/06/2012 9:37 AM   Other:     Other:     Other:      Scribe for Treatment Team:   Reyes Ivan 09/06/2012 9:37 AM

## 2012-09-06 NOTE — Progress Notes (Signed)
Pt did not attend d/c planning group on this date.  SW met with pt individually at this time.  Pt presents with calm mood and affect.  Pt rates depression and anxiety at a 4 today.  Pt denies SI.  Pt reports still hearing voices that tell her to use and is concerned she will relapse if d/c to shelter.  Pt states that she is now open to rehab.  SW secured pt's follow up at [facility] for further substance abuse treatment.  Pt is scheduled to go on Monday and verbalized an understanding of how to ride the bus and get to [facility].  Bus pass provided to pt.  No further needs voiced by pt at this time.    Reyes Ivan, LCSWA 09/06/2012  12:11 PM

## 2012-09-06 NOTE — Progress Notes (Signed)
[facility] MD Progress Note  09/06/2012 1:37 PM  S/O: Patient seen and evaluated. Chart reviewed. Patient stated that her mood was "allright, just got up". Her affect was mood congruent and groggy. She denied any current thoughts of self injurious behavior, suicidal ideation or homicidal ideation. Continued c/o AH, improved on Seroquel. There were no visual hallucinations, paranoia, delusional thought processes, or mania noted.  Thought process was linear and goal directed.  Speech was normal rate, tone and volume. Eye contact was good. Judgment and insight are fair.  Patient has been up and engaged on the unit.  No acute safety concerns reported from team.  Pt requesting residential Tx.  Accepted to [facility] for Monday.  Sig cravings still noted.  Sleep:  Number of Hours: 5.25    Vital Signs:Blood pressure 110/71, pulse 77, temperature 97.8 F (36.6 C), temperature source Oral, resp. rate 16.  Current Medications:    . feeding supplement  237 mL Oral BID BM  . fluconazole  150 mg Oral Once  . QUEtiapine  400 mg Oral QHS  . QUEtiapine  50 mg Oral BID  . DISCONTD: QUEtiapine  50 mg Oral Daily    Lab Results:  Results for orders placed during the hospital encounter of 08/31/12 (from the past 48 hour(s))  URINALYSIS, ROUTINE W REFLEX MICROSCOPIC     Status: Abnormal   Collection Time   09/05/12  5:00 AM      Component Value Range Comment   Color, Urine YELLOW  YELLOW    APPearance CLEAR  CLEAR    Specific Gravity, Urine 1.026  1.005 - 1.030    pH 5.5  5.0 - 8.0    Glucose, UA NEGATIVE  NEGATIVE mg/dL    Hgb urine dipstick TRACE (*) NEGATIVE    Bilirubin Urine NEGATIVE  NEGATIVE    Ketones, ur NEGATIVE  NEGATIVE mg/dL    Protein, ur NEGATIVE  NEGATIVE mg/dL    Urobilinogen, UA 0.2  0.0 - 1.0 mg/dL    Nitrite NEGATIVE  NEGATIVE    Leukocytes, UA NEGATIVE  NEGATIVE   URINE MICROSCOPIC-ADD ON     Status: Abnormal   Collection Time   09/05/12  5:00 AM      Component Value Range Comment   Squamous Epithelial / LPF RARE  RARE    WBC, UA 0-2  <3 WBC/hpf    Bacteria, UA FEW (*) RARE    Crystals CA OXALATE CRYSTALS (*) NEGATIVE   BASIC METABOLIC PANEL     Status: Abnormal   Collection Time   09/05/12  6:40 AM      Component Value Range Comment   Sodium 141  135 - 145 mEq/L    Potassium 4.2  3.5 - 5.1 mEq/L    Chloride 105  96 - 112 mEq/L    CO2 31  19 - 32 mEq/L    Glucose, Bld 102 (*) 70 - 99 mg/dL    BUN 13  6 - 23 mg/dL    Creatinine, Ser 1.61  0.50 - 1.10 mg/dL    Calcium 9.1  8.4 - 09.6 mg/dL    GFR calc non Af Amer 75 (*) >90 mL/min    GFR calc Af Amer 87 (*) >90 mL/min     Physical Findings: AIMS: Facial and Oral Movements Muscles of Facial Expression: None, normal Lips and Perioral Area: None, normal Jaw: None, normal Tongue: None, normal,Extremity Movements Upper (arms, wrists, hands, fingers): None, normal Lower (legs, knees, ankles, toes): None, normal, Trunk Movements  Neck, shoulders, hips: None, normal, Overall Severity Severity of abnormal movements (highest score from questions above): None, normal Incapacitation due to abnormal movements: None, normal Patient's awareness of abnormal movements (rate only patient's report): No Awareness, Dental Status Current problems with teeth and/or dentures?: Yes (cavities & some front teeth missing left side of mouth) Does patient usually wear dentures?: No  CIWA:  CIWA-Ar Total: 1   A/P: Pt seen and evaluated.  Reviewed short term and long term goals, medications, current treatment in the hospital and acute/chronic safety.  Pt denied any current thoughts of self harm, suicidal ideation or homicidal ideation.  Contracted for safety on the unit. VS reviewed with team.  Pt agreeable with treatment plan, see orders. Continue current medications as noted above, pt needs to be prepared for discharge Sunday for am admission to [facility] on Monday.  Lupe Carney 09/06/2012, 1:37 PM

## 2012-09-07 MED ORDER — AMANTADINE HCL 100 MG PO CAPS
100.0000 mg | ORAL_CAPSULE | Freq: Two times a day (BID) | ORAL | Status: DC
  Filled 2012-09-07 (×7): qty 1

## 2012-09-07 NOTE — Progress Notes (Signed)
  [name] is a 51 y.o. female [id number] 03/09/1961  08/31/2012 Active Problems:  Hallucination  Hyperglycemia  UTI (lower urinary tract infection)   Mental Status: Mood is good denies SI/HI/AVH. AH have decreased.   Subjective/Objective: Having cravings and dreams but it is normal for her at this point in detox.    Filed Vitals:   09/07/12 0913  BP: 123/76  Pulse: 73  Temp:   Resp:     Lab Results:   BMET    Component Value Date/Time   NA 141 09/05/2012 0640   K 4.2 09/05/2012 0640   CL 105 09/05/2012 0640   CO2 31 09/05/2012 0640   GLUCOSE 102* 09/05/2012 0640   BUN 13 09/05/2012 0640   CREATININE 0.89 09/05/2012 0640   CALCIUM 9.1 09/05/2012 0640   GFRNONAA 75* 09/05/2012 0640   GFRAA 87* 09/05/2012 0640    Medications:  Scheduled:     . feeding supplement  237 mL Oral BID BM  . QUEtiapine  400 mg Oral QHS  . QUEtiapine  50 mg Oral BID     PRN Meds acetaminophen, alum & mag hydroxide-simeth, magnesium hydroxide, traZODone  Plan : Symmetrel to help with cravings. Continue rest of current plan.  [name],Stephanie D. 09/07/2012

## 2012-09-07 NOTE — Progress Notes (Signed)
Patient ID: Vidhi Delellis, female   DOB: September 22, 1961, 51 y.o.   MRN: [id number]  [facility] Group Notes:  (Counselor/Nursing/MHT/Case Management/Adjunct)  09/07/2012 1:15 PM  Type of Therapy:  Group Therapy, Dance/Movement Therapy   Participation Level:  Active  Participation Quality:  Appropriate, Drowsy and Sharing  Affect:  Anxious and Irritable  Cognitive:  Appropriate  Insight:  Limited  Engagement in Group:  Good  Engagement in Therapy:  Limited  Modes of Intervention:  Clarification, Problem-solving, Role-play, Socialization and Support  Summary of Progress/Problems:  Pt attended counseling group on positive reframing and coping skills to utilize in facing addiction.  Pt was able to identify positive traits in other group members.  Pt spoke repeatedly about her difficulties in becoming clean but returning to get "what she wants."  Pt. Stated that she moves around, and gets away from triggers but no matter where she goes, she always manages to find triggers.  The pt then said that she needs to change herself and not just her surroundings.    Debarah Crape 09/07/2012. 2:47 PM

## 2012-09-07 NOTE — Progress Notes (Signed)
Pt. did not attend aftercare planning group. Pt. accepted daily workbook on healthy coping skills. 

## 2012-09-07 NOTE — Progress Notes (Signed)
Psychoeducational Group Note  Date:  09/07/2012 Time:  1030  Group Topic/Focus:  Wellness Toolbox:   The focus of this group is to discuss various aspects of wellness, balancing those aspects and exploring ways to increase the ability to experience wellness.  Patients will create a wellness toolbox for use upon discharge.  Participation Level:  Did Not Attend  Participation Quality:  Resistant  Affect:  Depressed  Cognitive:  Appropriate  Insight:  None  Engagement in Group:  None  Additional Comments:    Cresenciano Lick 09/07/2012, 11:35 AM

## 2012-09-07 NOTE — Progress Notes (Signed)
Patient ID: [name], female   DOB: 1961-11-11, 51 y.o.   MRN: [id number] D: Pt. In bed resting, eyes closed. A: Staff will monitor q62min for safety. R: pt. Remains safe on the unit. Resp. Even, unlabored, no distress noted.

## 2012-09-08 MED ORDER — QUETIAPINE FUMARATE 400 MG PO TABS: 400.0000 mg | ORAL_TABLET | Freq: Every day | ORAL | Status: DC

## 2012-09-08 MED ORDER — TRAZODONE HCL 50 MG PO TABS: 50.0000 mg | ORAL_TABLET | Freq: Every evening | ORAL | Status: DC | PRN

## 2012-09-08 MED ORDER — QUETIAPINE FUMARATE 50 MG PO TABS: 50.0000 mg | ORAL_TABLET | Freq: Two times a day (BID) | ORAL | Status: DC

## 2012-09-08 MED ORDER — NEOMYCIN-COLIST-HC-THONZONIUM 3.3-3-10-0.5 MG/ML OT SUSP
4.0000 [drp] | Freq: Four times a day (QID) | OTIC | Status: DC
  Administered 2012-09-08 – 2012-09-09 (×2): 4 [drp] via OTIC
  Filled 2012-09-08: qty 5

## 2012-09-08 MED ORDER — ADULT MULTIVITAMIN W/MINERALS CH: 1.0000 | ORAL_TABLET | Freq: Every day | ORAL | Status: DC

## 2012-09-08 NOTE — Progress Notes (Signed)
[facility] Group Notes:  (Counselor/Nursing/MHT/Case Management/Adjunct)  09/07/2012 2100  Type of Therapy:  wrap up group  Participation Level:  Active  Participation Quality:  Appropriate, Attentive and Sharing  Affect:  Appropriate  Cognitive:  Alert  Insight:  Limited  Engagement in Group:  Good  Engagement in Therapy:  Good  Modes of Intervention:  Clarification, Education and Support  Summary of Progress/Problems: Patient states that her energy level is much better.  She is pleased that the doctor increased her medicine from 200 - 400 mg.  Pt feels she is getting back on track and looking forward to going to [facility] on Monday.   Shelah Lewandowsky 09/08/2012, 3:06 AM

## 2012-09-08 NOTE — Progress Notes (Signed)
  Jayleena Stille is a 51 y.o. female [id number] Mar 15, 1961  08/31/2012 Active Problems:  Hallucination  Hyperglycemia  UTI (lower urinary tract infection)   Mental Status: Mood is happy about going to [facility] in am. Denies SI/HI/AVH    Subjective/Objective: C/O L ear discomfort. Has some irritation in L canal will order cortisporin otic.   Filed Vitals:   09/08/12 0701  BP: 121/75  Pulse: 96  Temp:   Resp:     Lab Results:   BMET    Component Value Date/Time   NA 141 09/05/2012 0640   K 4.2 09/05/2012 0640   CL 105 09/05/2012 0640   CO2 31 09/05/2012 0640   GLUCOSE 102* 09/05/2012 0640   BUN 13 09/05/2012 0640   CREATININE 0.89 09/05/2012 0640   CALCIUM 9.1 09/05/2012 0640   GFRNONAA 75* 09/05/2012 0640   GFRAA 87* 09/05/2012 0640    Medications:  Scheduled:     . amantadine  100 mg Oral BID  . feeding supplement  237 mL Oral BID BM  . neomycin-colistin-hydrocortisone-thonzonium  4 drop Left Ear Q6H  . QUEtiapine  400 mg Oral QHS  . QUEtiapine  50 mg Oral BID     PRN Meds acetaminophen, alum & mag hydroxide-simeth, magnesium hydroxide, traZODone Plan: Continue current plan of care -add cortisporin otic.  Rockie Schnoor,MICKIE D. 09/08/2012  +

## 2012-09-08 NOTE — Progress Notes (Signed)
[facility] Case Management Discharge Plan:  Will you be returning to the same living situation after discharge: No. At discharge, do you have transportation home?:Yes,   Do you have the ability to pay for your medications:Yes,    Interagency Information:     Release of information consent forms completed and in the chart;  Patient's signature needed at discharge.  Patient to Follow up at:  Follow-up Information    Follow up with [facility]. On 09/09/2012. (Arrive there at 8:00 AM promptly!!)    Contact information:   [address] [place], [place] [address] [phone]         Patient denies SI/HI:   Yes,      Safety Planning and Suicide Prevention discussed:  Yes,    Barrier to discharge identified:No.  Summary and Recommendations:Pt was cleared for D/C and denies any and all S/I and H/I.     [facility] 09/08/2012, 4:17 PM

## 2012-09-08 NOTE — Progress Notes (Signed)
Patient ID: [name], female   DOB: 11-21-1961, 51 y.o.   MRN: [id number]  [facility] Group Notes:  (Counselor/Nursing/MHT/Case Management/Adjunct)  09/08/2012 1:15 PM  Type of Therapy:  Group Therapy, Dance/Movement Therapy    Participation Level:  Did Not Attend   Modes of Intervention:  Clarification, Problem-solving, Role-play, Socialization and Support  Summary of Progress/Problems: Pt did not attend counseling group.  Pt. Received suicide prevention information and daily workbook earlier in the day.    Debarah Crape 09/08/2012. 2:51 PM

## 2012-09-08 NOTE — Progress Notes (Signed)
[facility] Group Notes:  (Counselor/Nursing/MHT/Case Management/Adjunct)  09/08/2012 6:31 PM  Type of Therapy:  Psychoeducational Skills  Participation Level:  Did Not Attend   Ingrid Shifrin Aileen 09/08/2012, 6:31 PM 

## 2012-09-08 NOTE — [facility] Suicide Risk Assessment (Signed)
Suicide Risk Assessment  Discharge Assessment     Demographic Factors:  aaf 51  Mental Status Per Nursing Assessment::   On Admission:  Suicidal ideation indicated by patient;Self-harm thoughts  Current Mental Status by Physician: No si, no his, no avh  Loss Factors: Financial problems/change in socioeconomic status  Historical Factors: Impulsivity  Risk Reduction Factors:   Living with another person, especially a relative  Continued Clinical Symptoms:  Alcohol/Substance Abuse/Dependencies  Discharge Diagnoses:   AXIS I:  Substance Abuse AXIS II:  Deferred AXIS III:   Past Medical History  Diagnosis Date  . Depression   . Bipolar 1 disorder    AXIS IV:  other psychosocial or environmental problems AXIS V:  51-60 moderate symptoms  Cognitive Features That Contribute To Risk:  Closed-mindedness    Suicide Risk:  Minimal: No identifiable suicidal ideation.  Patients presenting with no risk factors but with morbid ruminations; may be classified as minimal risk based on the severity of the depressive symptoms  Plan Of Care/Follow-up recommendations:  Activity:  [facility] in [facility]  Wonda Cerise 09/08/2012, 3:25 PM

## 2012-09-08 NOTE — Progress Notes (Signed)
Pt. did not attend participated in aftercare planning group. Pt. Accepted daily workbook on Support Systems.  

## 2012-09-08 NOTE — Progress Notes (Signed)
D.  Pt pleasant on approach, denied complaints.  Positive for evening group.  Denies SI/HI/hallucinations at this time.  Feels ready for discharge.  Interacting appropriately within milieu.  A.  Support and encouragement offered.  R.  Will continue to monitor.  Currently in room in bed, no distress noted.

## 2012-09-08 NOTE — Progress Notes (Signed)
[name] is out on unit interacting with peers and attending groups with minimal participation.  A-Reports good sleep and appetite. Mood and behavior are bright and energetic. Verbalizes cocaine cravings but is refusing amantidine. Denies SI. Anticipating d/c to long term treatment with subsequent move to [place] with family. R- Support and encouragement given. Continue current POC and evaluation of treatment goals. Continue 15' checks for safety.

## 2012-09-08 NOTE — Progress Notes (Addendum)
D.  Pt pleasant on approach, but very anxious as she was not given any discharge information.  Denies SI/HI/hallucinatiuons at this time.  Interacting appropriately on unit.  A.  Looked in chart for discharge information.  Medication samples present but prescriptions unsigned.  Brook McNichol RN spoke with Verne Spurr, PA regarding this and orders received to call Caroleen Hamman, NP to see if she could reprint the prescriptions and sign them.  If not then Lloyd Huger stated that she would call the prescriptions in in the AM to the pharmacy of the Pt's choice.  Caroleen Hamman, NP did not know how to do this, so the Pt states that she would like for Lloyd Huger to call her prescription in to [facility].  Pt also did not know what bus or when to get on the bus that she is to take.  Call put in to CM on call [place].  DId not receive a return call after four tries by this RN as well as one try by [place] Smith-Daniels AC.  Call put in to [place] Calix who was Nurse on Call.  Orders received to call other CM on call.  This was done with no return calls.  Lendon Collar, RN, looked up bus schedule for Pt on computer and printed this out for her.  Pt feels comfortable with this.  AC called [facility] to learn what time they will get the PT at [facility] bus stop.  This Clinical research associate is to call [facility] in the AM after Pt leaves our facility and speak to [place] regarding pickup.  R.  Pt feels confident with this plan of action, no distress noted, no further questions voiced.  Will continue to monitor.

## 2012-09-08 NOTE — Progress Notes (Signed)
Psychoeducational Group Note  Date:  09/08/2012 Time:  1030  Group Topic/Focus:  Crisis Planning:   The purpose of this group is to help patients create a crisis plan for use upon discharge or in the future, as needed.  Participation Level:  None  Participation Quality:  Resistant  Affect:  Blunted  Cognitive:  Alert  Insight:  Limited  Engagement in Group:  None  Additional Comments:    Cresenciano Lick 09/08/2012, 12:17 PM

## 2012-09-09 NOTE — Progress Notes (Signed)
Patient did not attend this evenings speaker AA meeting.  

## 2012-09-10 NOTE — Progress Notes (Signed)
Patient Discharge Instructions:  After Visit Summary (AVS):   Faxed to:  09/10/2012 Psychiatric Admission Assessment Note:   Faxed to:  09/10/2012 Suicide Risk Assessment - Discharge Assessment:   Faxed to:  09/10/2012 Faxed/Sent to the Next Level Care provider:  09/10/2012  Faxed to [facility] @ [phone]  Wandra Scot, 09/10/2012, 2:04 PM

## 2012-09-25 NOTE — Discharge Summary (Signed)
Physician Discharge Summary Note  Patient:  [name] is an 51 y.o., female MRN:  [id number] DOB:  1961-04-02 Patient phone:  [phone] (home)  Patient address:   [address]  [place] [address]   Date of Admission:  08/31/2012 Date of Discharge: 09/09/2012  Discharge Diagnoses: Active Problems:  Hallucination  Hyperglycemia  UTI (lower urinary tract infection)  Axis Diagnosis:  Discharge Diagnoses:  AXIS I: Substance Abuse  AXIS II: Deferred  AXIS III:  Past Medical History   Diagnosis  Date   .  Depression    .  Bipolar 1 disorder     AXIS IV: other psychosocial or environmental problems  AXIS V: 51-60 moderate symptoms   Level of Care:  [facility]  Hospital Course:   Noncompliant with meds relapsed in June.  Presented to [facility] voluntarily c/o SI and AH. Says that due to financial issues hasn't refilled her Seroquel. She reports prior suicide attempts and hospitalizations. Using cocaine twice a week. Has been living with friends but does not feel safe to return there.      [name] was minimally involved in recovery while at [facility]. She attended 3-4 groups total while at [facility] for approximately 9 days.  She did engage with female residents and had to be encouraged by the staff to keep a "polite distance" from several different men while there.     On the day of discharge [name]'s plans were unclear to staff or the patient.  She was to contact [facility] the next morning after returning home the day she leaves [facility].   Medication List     As of 09/25/2012 10:25 PM    TAKE these medications      Indication    multivitamin with minerals Tabs   Take 1 tablet by mouth daily. For nutritional supplement.       QUEtiapine 50 MG tablet   Commonly known as: SEROQUEL   Take 1 tablet (50 mg total) by mouth 2 (two) times daily. For clear thoughts and suppression of cocaine cravings.       QUEtiapine 400 MG tablet   Commonly known as: SEROQUEL   Take 1 tablet (400 mg  total) by mouth at bedtime. For clear thoughts and stable mood, and suppress cocaine cravings.       QUEtiapine 400 MG tablet   Commonly known as: SEROQUEL   Take 1 tablet (400 mg total) by mouth at bedtime. For depression and sleep.    Indication: Depressive Phase of Manic-Depression, Trouble Sleeping      QUEtiapine 50 MG tablet   Commonly known as: SEROQUEL   Take 1 tablet (50 mg total) by mouth 2 (two) times daily. For anxiety.    Indication: Trouble Sleeping      traZODone 50 MG tablet   Commonly known as: DESYREL   Take 1 tablet (50 mg total) by mouth at bedtime as needed for sleep (May repeat dose x 1).    Indication: Trouble Sleeping       They were also enrolled in group counseling sessions and activities in which [name] participated minimally.        Follow-up Information    Follow up with [facility]. On 09/09/2012. (Arrive there at 8:00 AM promptly!!)    Contact information:   [address] [place], [place] [address] [phone]         Upon discharge, patient adamantly denies suicidal, homicidal ideations, auditory, visual hallucinations and or delusional thinking. They left [facility]  with all personal belongings via public transportation in no apparent distress.  Consults:  none  Significant Diagnostic Studies:  none  Discharge Vitals:   Blood pressure 121/75, pulse 96, temperature 98.3 F (36.8 C), temperature source Oral, resp. rate 18..  Mental Status Exam: See Mental Status Examination and Suicide Risk Assessment completed by Attending Physician prior to discharge.  Discharge destination:  Home  Is patient on multiple antipsychotic therapies at discharge:  No  Has Patient had three or more failed trials of antipsychotic monotherapy by history: N/A Recommended Plan for Multiple Antipsychotic Therapies: N/A Discharge Orders    Future Orders Please Complete By Expires   Diet - low sodium heart healthy      Increase activity slowly       Discharge instructions      Comments:   Take all of your medications as prescribed.  Be sure to keep ALL follow up appointments as scheduled. This is to ensure getting your refills on time to avoid any interruption in your medication.  If you find that you can not keep your appointment, call the clinic and reschedule. Be sure to tell the nurse if you will need a refill before your appointment.       Medication List     As of 09/25/2012 10:25 PM    TAKE these medications      Indication    multivitamin with minerals Tabs   Take 1 tablet by mouth daily. For nutritional supplement.       QUEtiapine 50 MG tablet   Commonly known as: SEROQUEL   Take 1 tablet (50 mg total) by mouth 2 (two) times daily. For clear thoughts and suppression of cocaine cravings.       QUEtiapine 400 MG tablet   Commonly known as: SEROQUEL   Take 1 tablet (400 mg total) by mouth at bedtime. For clear thoughts and stable mood, and suppress cocaine cravings.       QUEtiapine 400 MG tablet   Commonly known as: SEROQUEL   Take 1 tablet (400 mg total) by mouth at bedtime. For depression and sleep.    Indication: Depressive Phase of Manic-Depression, Trouble Sleeping      QUEtiapine 50 MG tablet   Commonly known as: SEROQUEL   Take 1 tablet (50 mg total) by mouth 2 (two) times daily. For anxiety.    Indication: Trouble Sleeping      traZODone 50 MG tablet   Commonly known as: DESYREL   Take 1 tablet (50 mg total) by mouth at bedtime as needed for sleep (May repeat dose x 1).    Indication: Trouble Sleeping           Follow-up Information    Follow up with [facility]. On 09/09/2012. (Arrive there at 8:00 AM promptly!!)    Contact information:   [address] [place], [place] [address] [phone]        Follow-up recommendations:   Activities: Resume typical activities Diet: Resume typical diet Tests: one Other: Follow up with outpatient provider and report any side effects to out patient  prescriber.  Comments:  Take all your medications as prescribed by your mental healthcare provider. Report any adverse effects and or reactions from your medicines to your outpatient provider promptly. Patient is instructed and cautioned to not engage in alcohol and or illegal drug use while on prescription medicines. In the event of worsening symptoms, patient is instructed to call the crisis hotline, 911 and or go to the  nearest ED for appropriate evaluation and treatment of symptoms.  Signed: Fidelis Loth 09/25/2012 10:25 PM

## 2013-01-06 ENCOUNTER — Emergency Department (HOSPITAL_COMMUNITY)
Admission: EM | Admit: 2013-01-06 | Discharge: 2013-01-07 | Disposition: A | Discharge status: Home / Self Care | Payer: Medicare Other | Source: Home / Self Care | Attending: Emergency Medicine | Admitting: Emergency Medicine

## 2013-01-06 ENCOUNTER — Encounter (HOSPITAL_COMMUNITY): Payer: Self-pay | Admitting: *Deleted

## 2013-01-06 DIAGNOSIS — F1110 Opioid abuse, uncomplicated: Secondary | ICD-10-CM | POA: Insufficient documentation

## 2013-01-06 DIAGNOSIS — F1910 Other psychoactive substance abuse, uncomplicated: Secondary | ICD-10-CM | POA: Insufficient documentation

## 2013-01-06 DIAGNOSIS — F3289 Other specified depressive episodes: Secondary | ICD-10-CM | POA: Insufficient documentation

## 2013-01-06 DIAGNOSIS — F309 Manic episode, unspecified: Secondary | ICD-10-CM | POA: Insufficient documentation

## 2013-01-06 DIAGNOSIS — F319 Bipolar disorder, unspecified: Secondary | ICD-10-CM | POA: Insufficient documentation

## 2013-01-06 DIAGNOSIS — Z3202 Encounter for pregnancy test, result negative: Secondary | ICD-10-CM | POA: Insufficient documentation

## 2013-01-06 DIAGNOSIS — F329 Major depressive disorder, single episode, unspecified: Secondary | ICD-10-CM | POA: Insufficient documentation

## 2013-01-06 DIAGNOSIS — F1410 Cocaine abuse, uncomplicated: Secondary | ICD-10-CM

## 2013-01-06 DIAGNOSIS — Z0000 Encounter for general adult medical examination without abnormal findings: Secondary | ICD-10-CM | POA: Insufficient documentation

## 2013-01-06 DIAGNOSIS — Z79899 Other long term (current) drug therapy: Secondary | ICD-10-CM | POA: Insufficient documentation

## 2013-01-06 DIAGNOSIS — R45851 Suicidal ideations: Secondary | ICD-10-CM | POA: Insufficient documentation

## 2013-01-06 DIAGNOSIS — F17200 Nicotine dependence, unspecified, uncomplicated: Secondary | ICD-10-CM | POA: Insufficient documentation

## 2013-01-06 LAB — COMPREHENSIVE METABOLIC PANEL
ALT: 18 U/L (ref 0–35)
Alkaline Phosphatase: 88 U/L (ref 39–117)
BUN: 18 mg/dL (ref 6–23)
CO2: 26 mEq/L (ref 19–32)
GFR calc Af Amer: 69 mL/min — ABNORMAL LOW (ref >90)
GFR calc non Af Amer: 59 mL/min — ABNORMAL LOW (ref >90)
Glucose, Bld: 139 mg/dL — ABNORMAL HIGH (ref 70–99)
Potassium: 3.9 mEq/L (ref 3.5–5.1)
Sodium: 139 mEq/L (ref 135–145)
Total Bilirubin: 0.3 mg/dL (ref 0.3–1.2)
Total Protein: 6.9 g/dL (ref 6.0–8.3)

## 2013-01-06 LAB — ETHANOL: Alcohol, Ethyl (B): <11 mg/dL (ref 0–11)

## 2013-01-06 LAB — ACETAMINOPHEN LEVEL: Acetaminophen (Tylenol), Serum: <15 ug/mL (ref 10–30)

## 2013-01-06 LAB — PREGNANCY, URINE: Preg Test, Ur: NEGATIVE

## 2013-01-06 LAB — CBC
HCT: 42.4 % (ref 36.0–46.0)
Hemoglobin: 14.2 g/dL (ref 12.0–15.0)
MCHC: 33.5 g/dL (ref 30.0–36.0)
RBC: 4.87 MIL/uL (ref 3.87–5.11)

## 2013-01-06 LAB — RAPID URINE DRUG SCREEN, HOSP PERFORMED
Barbiturates: NOT DETECTED
Tetrahydrocannabinol: POSITIVE — AB

## 2013-01-06 MED ORDER — NICOTINE 7 MG/24HR TD PT24: 7.0000 mg | MEDICATED_PATCH | Freq: Every day | TRANSDERMAL | Status: DC

## 2013-01-06 MED ORDER — ALUM & MAG HYDROXIDE-SIMETH 200-200-20 MG/5ML PO SUSP: 30.0000 mL | ORAL | Status: DC | PRN

## 2013-01-06 MED ORDER — ADULT MULTIVITAMIN W/MINERALS CH: 1.0000 | ORAL_TABLET | Freq: Every day | ORAL | Status: DC

## 2013-01-06 MED ORDER — ACETAMINOPHEN 325 MG PO TABS: 650.0000 mg | ORAL_TABLET | ORAL | Status: DC | PRN

## 2013-01-06 MED ORDER — ZOLPIDEM TARTRATE 5 MG PO TABS: 5.0000 mg | ORAL_TABLET | Freq: Every evening | ORAL | Status: DC | PRN

## 2013-01-06 MED ORDER — QUETIAPINE FUMARATE 300 MG PO TABS: 400.0000 mg | ORAL_TABLET | Freq: Every day | ORAL | Status: DC

## 2013-01-06 MED ORDER — IBUPROFEN 600 MG PO TABS: 600.0000 mg | ORAL_TABLET | Freq: Three times a day (TID) | ORAL | Status: DC | PRN

## 2013-01-06 MED ORDER — QUETIAPINE FUMARATE 50 MG PO TABS: 50.0000 mg | ORAL_TABLET | Freq: Two times a day (BID) | ORAL | Status: DC

## 2013-01-06 MED ORDER — ONDANSETRON HCL 4 MG PO TABS: 4.0000 mg | ORAL_TABLET | Freq: Three times a day (TID) | ORAL | Status: DC | PRN

## 2013-01-06 NOTE — ED Notes (Signed)
Pt states has been having thoughts of hurting herself; states has been using cocaine; last use yesterday and wants to get clean; pt moving nonstop in triage

## 2013-01-06 NOTE — ED Provider Notes (Signed)
History     CSN: [id number]  Arrival date & time 01/06/13  2048   First MD Initiated Contact with Patient 01/06/13 2214      Chief Complaint  Patient presents with  . Medical Clearance    (Consider location/radiation/quality/duration/timing/severity/associated sxs/prior treatment) The history is provided by the patient.   52 year old female comes in with a one-week history of depression. She has had suicidal thoughts without a suicidal plan. She has constitutional symptoms of depression including crying spells and anhedonia but denies early morning awakening. She denies hallucinations. She has been using cocaine and last used it last night. She is a cigarette smoker and does drink occasionally. She denies other drug use. She has been a patient at behavioral health in the past and thinks that she needs to go and to help with depression and cocaine use.  Past Medical History  Diagnosis Date  . Depression   . Bipolar 1 disorder     History reviewed. No pertinent past surgical history.  Family History  Problem Relation Age of Onset  . Anesthesia problems Neg Hx   . Hypotension Neg Hx   . Malignant hyperthermia Neg Hx   . Pseudochol deficiency Neg Hx     History  Substance Use Topics  . Smoking status: Current Every Day Smoker -- 0.5 packs/day for 10 years    Types: Cigarettes  . Smokeless tobacco: Never Used  . Alcohol Use: Yes     Comment: occasionally    OB History    Grav Para Term Preterm Abortions TAB SAB Ect Mult Living                  Review of Systems  All other systems reviewed and are negative.    Allergies  Review of patient's allergies indicates no known allergies.  Home Medications   Current Outpatient Rx  Name  Route  Sig  Dispense  Refill  . ADULT MULTIVITAMIN W/MINERALS CH   Oral   Take 1 tablet by mouth daily. For nutritional supplement.         . QUETIAPINE FUMARATE 400 MG PO TABS   Oral   Take 400 mg by mouth at bedtime.           Marland Kitchen QUETIAPINE FUMARATE 50 MG PO TABS   Oral   Take 50 mg by mouth 2 (two) times daily. 1 tab in am, 1 tab midday.           BP 116/56  Pulse 101  Temp 98.1 F (36.7 C)  Resp 20  SpO2 98%  Physical Exam  Nursing note and vitals reviewed.  52 year old female, resting comfortably and in no acute distress. Vital signs are significant for borderline tachycardia with heart rate of 101. Oxygen saturation is 98%, which is normal. Head is normocephalic and atraumatic. PERRLA, EOMI. Oropharynx is clear. Neck is nontender and supple without adenopathy or JVD. Back is nontender and there is no CVA tenderness. Lungs are clear without rales, wheezes, or rhonchi. Chest is nontender. Heart has regular rate and rhythm without murmur. Abdomen is soft, flat, nontender without masses or hepatosplenomegaly and peristalsis is normoactive. Extremities have no cyanosis or edema, full range of motion is present. Skin is warm and dry without rash. Neurologic: Mental status is normal, cranial nerves are intact, there are no motor or sensory deficits. Psychiatric: She does not appear overtly depressed and is very restless with constant movement.  ED Course  Procedures (including critical care time)  Results for orders placed during the hospital encounter of 01/06/13  ACETAMINOPHEN LEVEL      Component Value Range   Acetaminophen (Tylenol), Serum <15.0  10 - 30 ug/mL  CBC      Component Value Range   WBC 4.7  4.0 - 10.5 K/uL   RBC 4.87  3.87 - 5.11 MIL/uL   Hemoglobin 14.2  12.0 - 15.0 g/dL   HCT 16.1  09.6 - 04.5 %   MCV 87.1  78.0 - 100.0 fL   MCH 29.2  26.0 - 34.0 pg   MCHC 33.5  30.0 - 36.0 g/dL   RDW 40.9  81.1 - 91.4 %   Platelets 267  150 - 400 K/uL  COMPREHENSIVE METABOLIC PANEL      Component Value Range   Sodium 139  135 - 145 mEq/L   Potassium 3.9  3.5 - 5.1 mEq/L   Chloride 101  96 - 112 mEq/L   CO2 26  19 - 32 mEq/L   Glucose, Bld 139 (*) 70 - 99 mg/dL   BUN 18  6 - 23 mg/dL    Creatinine, Ser 7.82  0.50 - 1.10 mg/dL   Calcium 9.3  8.4 - 95.6 mg/dL   Total Protein 6.9  6.0 - 8.3 g/dL   Albumin 3.4 (*) 3.5 - 5.2 g/dL   AST 27  0 - 37 U/L   ALT 18  0 - 35 U/L   Alkaline Phosphatase 88  39 - 117 U/L   Total Bilirubin 0.3  0.3 - 1.2 mg/dL   GFR calc non Af Amer 59 (*) >90 mL/min   GFR calc Af Amer 69 (*) >90 mL/min  ETHANOL      Component Value Range   Alcohol, Ethyl (B) <11  0 - 11 mg/dL  SALICYLATE LEVEL      Component Value Range   Salicylate Lvl <2.0 (*) 2.8 - 20.0 mg/dL  URINE RAPID DRUG SCREEN (HOSP PERFORMED)      Component Value Range   Opiates POSITIVE (*) NONE DETECTED   Cocaine POSITIVE (*) NONE DETECTED   Benzodiazepines NONE DETECTED  NONE DETECTED   Amphetamines NONE DETECTED  NONE DETECTED   Tetrahydrocannabinol POSITIVE (*) NONE DETECTED   Barbiturates NONE DETECTED  NONE DETECTED  PREGNANCY, URINE      Component Value Range   Preg Test, Ur NEGATIVE  NEGATIVE    1. Depression   2. Suicidal ideation   3. Cocaine abuse       MDM  Worsening depression. Screening labs will be obtained and she will be moved to the psychiatric emergency department and consultation will be obtained with ACT Team.        Dione Booze, MD 01/06/13 2359

## 2013-01-07 ENCOUNTER — Encounter (HOSPITAL_COMMUNITY): Payer: Self-pay | Admitting: *Deleted

## 2013-01-07 ENCOUNTER — Emergency Department (HOSPITAL_COMMUNITY)
Admission: EM | Admit: 2013-01-07 | Discharge: 2013-01-07 | Disposition: A | Discharge status: Other Acute Inpatient Hospital | Payer: Medicare Other | Attending: Emergency Medicine | Admitting: Emergency Medicine

## 2013-01-07 ENCOUNTER — Encounter (HOSPITAL_COMMUNITY): Payer: Self-pay

## 2013-01-07 ENCOUNTER — Inpatient Hospital Stay (HOSPITAL_COMMUNITY)
Admission: RE | Admit: 2013-01-07 | Discharge: 2013-01-16 | DRG: 897 | Disposition: A | Discharge status: Home / Self Care | Payer: No Typology Code available for payment source | Attending: Psychiatry | Admitting: Psychiatry

## 2013-01-07 ENCOUNTER — Encounter (HOSPITAL_COMMUNITY): Payer: Self-pay | Admitting: Behavioral Health

## 2013-01-07 DIAGNOSIS — R443 Hallucinations, unspecified: Secondary | ICD-10-CM

## 2013-01-07 DIAGNOSIS — F1410 Cocaine abuse, uncomplicated: Secondary | ICD-10-CM

## 2013-01-07 DIAGNOSIS — F3130 Bipolar disorder, current episode depressed, mild or moderate severity, unspecified: Secondary | ICD-10-CM

## 2013-01-07 DIAGNOSIS — F1910 Other psychoactive substance abuse, uncomplicated: Secondary | ICD-10-CM

## 2013-01-07 DIAGNOSIS — F1420 Cocaine dependence, uncomplicated: Secondary | ICD-10-CM | POA: Diagnosis present

## 2013-01-07 DIAGNOSIS — F1110 Opioid abuse, uncomplicated: Secondary | ICD-10-CM

## 2013-01-07 DIAGNOSIS — F319 Bipolar disorder, unspecified: Secondary | ICD-10-CM | POA: Diagnosis present

## 2013-01-07 DIAGNOSIS — Z79899 Other long term (current) drug therapy: Secondary | ICD-10-CM

## 2013-01-07 DIAGNOSIS — F1994 Other psychoactive substance use, unspecified with psychoactive substance-induced mood disorder: Secondary | ICD-10-CM

## 2013-01-07 DIAGNOSIS — F29 Unspecified psychosis not due to a substance or known physiological condition: Secondary | ICD-10-CM

## 2013-01-07 HISTORY — DX: Other psychoactive substance abuse, uncomplicated: F19.10

## 2013-01-07 MED ORDER — ACETAMINOPHEN 325 MG PO TABS: 650.0000 mg | ORAL_TABLET | ORAL | Status: DC | PRN

## 2013-01-07 MED ORDER — ALUM & MAG HYDROXIDE-SIMETH 200-200-20 MG/5ML PO SUSP: 30.0000 mL | ORAL | Status: DC | PRN

## 2013-01-07 MED ORDER — QUETIAPINE FUMARATE 400 MG PO TABS: 400.0000 mg | ORAL_TABLET | Freq: Every day | ORAL | Status: DC

## 2013-01-07 MED ORDER — ZOLPIDEM TARTRATE 5 MG PO TABS: 5.0000 mg | ORAL_TABLET | Freq: Every evening | ORAL | Status: DC | PRN

## 2013-01-07 MED ORDER — DICYCLOMINE HCL 20 MG PO TABS: 20.0000 mg | ORAL_TABLET | Freq: Four times a day (QID) | ORAL | Status: AC | PRN

## 2013-01-07 MED ORDER — CLONIDINE HCL 0.1 MG PO TABS
0.1000 mg | ORAL_TABLET | Freq: Four times a day (QID) | ORAL | Status: AC
  Administered 2013-01-09 – 2013-01-10 (×4): 0.1 mg via ORAL
  Filled 2013-01-07 (×10): qty 1

## 2013-01-07 MED ORDER — METHOCARBAMOL 500 MG PO TABS: 500.0000 mg | ORAL_TABLET | Freq: Three times a day (TID) | ORAL | Status: AC | PRN

## 2013-01-07 MED ORDER — CLONIDINE HCL 0.1 MG PO TABS
0.1000 mg | ORAL_TABLET | ORAL | Status: AC
  Administered 2013-01-10: 0.1 mg via ORAL
  Filled 2013-01-07 (×4): qty 1

## 2013-01-07 MED ORDER — ONDANSETRON HCL 4 MG PO TABS: 4.0000 mg | ORAL_TABLET | Freq: Three times a day (TID) | ORAL | Status: DC | PRN

## 2013-01-07 MED ORDER — ACETAMINOPHEN 325 MG PO TABS
650.0000 mg | ORAL_TABLET | Freq: Four times a day (QID) | ORAL | Status: DC | PRN
  Administered 2013-01-15 (×2): 650 mg via ORAL

## 2013-01-07 MED ORDER — NAPROXEN 500 MG PO TABS: 500.0000 mg | ORAL_TABLET | Freq: Two times a day (BID) | ORAL | Status: AC | PRN

## 2013-01-07 MED ORDER — LOPERAMIDE HCL 2 MG PO CAPS: 2.0000 mg | ORAL_CAPSULE | ORAL | Status: AC | PRN

## 2013-01-07 MED ORDER — QUETIAPINE FUMARATE 50 MG PO TABS: 50.0000 mg | ORAL_TABLET | Freq: Two times a day (BID) | ORAL | Status: DC

## 2013-01-07 MED ORDER — ADULT MULTIVITAMIN W/MINERALS CH
1.0000 | ORAL_TABLET | Freq: Every day | ORAL | Status: DC
  Administered 2013-01-08 – 2013-01-16 (×9): 1 via ORAL
  Filled 2013-01-07 (×10): qty 1

## 2013-01-07 MED ORDER — CLONIDINE HCL 0.1 MG PO TABS
0.1000 mg | ORAL_TABLET | Freq: Two times a day (BID) | ORAL | Status: DC
  Filled 2013-01-07: qty 1

## 2013-01-07 MED ORDER — QUETIAPINE FUMARATE 400 MG PO TABS
400.0000 mg | ORAL_TABLET | Freq: Every day | ORAL | Status: DC
  Administered 2013-01-08 – 2013-01-15 (×8): 400 mg via ORAL
  Filled 2013-01-07 (×3): qty 1
  Filled 2013-01-07: qty 4
  Filled 2013-01-07 (×7): qty 1

## 2013-01-07 MED ORDER — ONDANSETRON 4 MG PO TBDP: 4.0000 mg | ORAL_TABLET | Freq: Four times a day (QID) | ORAL | Status: AC | PRN

## 2013-01-07 MED ORDER — MAGNESIUM HYDROXIDE 400 MG/5ML PO SUSP: 30.0000 mL | Freq: Every day | ORAL | Status: DC | PRN

## 2013-01-07 MED ORDER — CHLORDIAZEPOXIDE HCL 25 MG PO CAPS: 25.0000 mg | ORAL_CAPSULE | Freq: Four times a day (QID) | ORAL | Status: DC | PRN

## 2013-01-07 MED ORDER — NICOTINE 21 MG/24HR TD PT24: 21.0000 mg | MEDICATED_PATCH | Freq: Every day | TRANSDERMAL | Status: DC

## 2013-01-07 MED ORDER — IBUPROFEN 200 MG PO TABS: 600.0000 mg | ORAL_TABLET | Freq: Three times a day (TID) | ORAL | Status: DC | PRN

## 2013-01-07 MED ORDER — NICOTINE 21 MG/24HR TD PT24
21.0000 mg | MEDICATED_PATCH | Freq: Every day | TRANSDERMAL | Status: DC
  Filled 2013-01-07 (×10): qty 1

## 2013-01-07 MED ORDER — HYDROXYZINE HCL 25 MG PO TABS: 25.0000 mg | ORAL_TABLET | Freq: Four times a day (QID) | ORAL | Status: DC | PRN

## 2013-01-07 MED ORDER — CLONIDINE HCL 0.1 MG PO TABS
0.1000 mg | ORAL_TABLET | Freq: Every day | ORAL | Status: AC
  Filled 2013-01-07 (×2): qty 1

## 2013-01-07 NOTE — ED Notes (Signed)
Security called again to wand pt, will monitor.

## 2013-01-07 NOTE — ED Provider Notes (Signed)
Patient was discharged this morning initially but then behavior health and complained of being suicidal. Patient does have a bed available tomorrow but we to be kept over here in the psychiatric ed. patient denies any ingestions at this time. She has no new complaints  Toy Baker, MD 01/07/13 1343

## 2013-01-07 NOTE — ED Notes (Signed)
Pt d/c'd this am from detox from cocaine, walked across to [facility] states she has been accepted over there but no beds available at this time. Pt will not contract for safety so pt brought back here to wait for availibility of bed. Pt a/o x4, no distress. EDP allen notified states no labs needed at this time. [place],ACT notified of pt.

## 2013-01-07 NOTE — Tx Team (Signed)
Initial Interdisciplinary Treatment Plan  PATIENT STRENGTHS: (choose at least two) Active sense of humor Motivation for treatment/growth Supportive family/friends  PATIENT STRESSORS: Financial difficulties Medication change or noncompliance Substance abuse   PROBLEM LIST: Problem List/Patient Goals Date to be addressed Date deferred Reason deferred Estimated date of resolution  Substance Abuse (Etoh, crack cocaine, THC) 01/07/2013     Medication compliance 01/07/2013     Suicidal Ideations 01/07/2013                                          DISCHARGE CRITERIA:  Ability to meet basic life and health needs Improved stabilization in mood, thinking, and/or behavior Need for constant or close observation no longer present Safe-care adequate arrangements made Verbal commitment to aftercare and medication compliance  PRELIMINARY DISCHARGE PLAN: Attend aftercare/continuing care group Attend PHP/IOP Outpatient therapy Return to previous living arrangement  PATIENT/FAMIILY INVOLVEMENT: This treatment plan has been presented to and reviewed with the patient, [name].  The patient and family have been given the opportunity to ask questions and make suggestions.  Angeline Slim M 01/07/2013, 11:57 PM

## 2013-01-07 NOTE — ED Notes (Signed)
Per Diane, Consulting civil engineer, after speaking with [facility] and Dr. Freida Busman, pt needs to change into blue scrubs, be wanded and moved back to Psych ED, will call report Psych ED now, pt in BR changing.

## 2013-01-07 NOTE — BH Assessment (Signed)
Assessment Note   [name] is an 52 y.o. female. Walk in for assessment with her personal property requesting admission for depression and suicidal thoughts with plan to overdose. She does have in her possession a bottle of Seroquel she states she would use to overdose. Reports a history of psychiatric admissions in her past, most recent one here in Sept.2013. She states she has had two significant suicide attempts, one overdosing and one time cutting wrist. She states she has been having a difficult time since the holidays. She is living with some of her daughters friends but her daughter went back to [place] where they are from and since then she has been lonely and missing her and reports a limited support system even though she has been living here in [place] for six years. She endorses using crack daily about 50 dollars worth and using marijuana and small amounts of alcohol. Her UDS was positive for opiates, cocaine and THC. No explanation for opiates in her urine.She states she last used anything two days ago, and denies any type of withdrawal symptoms. She states she needs to get her thinking straight meaning not thinking about suicide, and get cleaned up from drug use and then states her plans are to return to [place] where she is from and where her daughter is living. She reports a good response from Seroquel when she takes it, and she last took it two days ago.She states she stopped taking it when she was using crack and ETOH. She is a client at the [facility] and was last there one month ago.She denies any hallucinations or thoughts to hurt others. She is cooperative with this interview. She was in the [facility] until this am when she was discharged home with referrals for outpatient treatment for substance use. She states she tried to tell them she needed inpatient help for her depression and suicidal thoughts but they released her and she came here stating she always comes here  when she needs help. Tired and hungry. She states she only sleeps two hours a night for several weeks. Consulted with [facility] for admission but no bed available at this time so sent over to [facility] for holding. Fed lunch before being sent over. Agreed to plan and cooperative with process.  Axis I: Bipolar, Depressed and Rule out Substance dependence Axis II: Deferred Axis III:  Past Medical History  Diagnosis Date  . Depression   . Bipolar 1 disorder    Axis IV: other psychosocial or environmental problems and problems with primary support group Axis V: 31-40 impairment in reality testing  Past Medical History:  Past Medical History  Diagnosis Date  . Depression   . Bipolar 1 disorder     No past surgical history on file.  Family History:  Family History  Problem Relation Age of Onset  . Anesthesia problems Neg Hx   . Hypotension Neg Hx   . Malignant hyperthermia Neg Hx   . Pseudochol deficiency Neg Hx     Social History:  reports that she has been smoking Cigarettes.  She has a 5 pack-year smoking history. She has never used smokeless tobacco. She reports that she drinks alcohol. She reports that she uses illicit drugs ("Crack" cocaine and Marijuana).  Additional Social History:  Alcohol / Drug Use Pain Medications: not abusing Prescriptions: not abusing Over the Counter: not abusing History of alcohol / drug use?: Yes Substance #1 Name of Substance 1: cocain 1 - Age of  First Use: unnknown but states for a long time 1 - Amount (size/oz): 50 dollars qd 1 - Frequency: daily 1 - Duration: for years 1 - Last Use / Amount: two days ago, 01/05/2013 Substance #2 Name of Substance 2: marijuana 2 - Age of First Use: unknown 2 - Amount (size/oz): one or two blunts qd 2 - Frequency: daily 2 - Duration: for years 2 - Last Use / Amount: two days ago Substance #3 Name of Substance 3: alcohol 3 - Age of First Use: unknown 3 - Amount (size/oz): "a small amount" 3 - Frequency:  several times a week 3 - Duration: for years 3 - Last Use / Amount: two days ago  CIWA: CIWA-Ar Nausea and Vomiting: no nausea and no vomiting Tactile Disturbances: none Tremor: no tremor Paroxysmal Sweats: no sweat visible Visual Disturbances: not present Anxiety: mildly anxious Headache, Fullness in Head: none present Agitation: normal activity Orientation and Clouding of Sensorium: oriented and can do serial additions COWS:    Allergies: No Known Allergies  Home Medications:  (Not in a hospital admission)  OB/GYN Status:  No LMP recorded. Patient is not currently having periods (Reason: Perimenopausal).  General Assessment Data Location of Assessment: [facility] Assessment Services Living Arrangements: Non-relatives/Friends Can pt return to current living arrangement?: Yes Admission Status: Voluntary Is patient capable of signing voluntary admission?: Yes Transfer from: Acute Hospital Referral Source: Self/Family/Friend  Education Status Is patient currently in school?: No  Risk to self Suicidal Ideation: Yes-Currently Present Suicidal Intent: Yes-Currently Present Is patient at risk for suicide?: Yes Suicidal Plan?: Yes-Currently Present Specify Current Suicidal Plan: to overdose on her Seroquel Access to Means: Yes Specify Access to Suicidal Means: has a bottle with a significant amt of Seroquel in it What has been your use of drugs/alcohol within the last 12 months?: uses cocaine, and pot daily and several times a wk ETOH Previous Attempts/Gestures: Yes How many times?: 2  Other Self Harm Risks: none Triggers for Past Attempts: Unknown Intentional Self Injurious Behavior: None Family Suicide History: No Recent stressful life event(s):  (daughter left area recently for [place] and she is lonely) Persecutory voices/beliefs?: No Depression: Yes Depression Symptoms: Despondent;Insomnia;Fatigue;Loss of interest in usual pleasures;Feeling worthless/self pity Substance abuse  history and/or treatment for substance abuse?: Yes Suicide prevention information given to non-admitted patients: Yes  Risk to Others Homicidal Ideation: No Thoughts of Harm to Others: No Current Homicidal Intent: No Current Homicidal Plan: No Access to Homicidal Means: No History of harm to others?: No Assessment of Violence: None Noted Does patient have access to weapons?: No Criminal Charges Pending?: No Does patient have a court date: No  Psychosis Hallucinations: None noted Delusions: None noted  Mental Status Report Appear/Hygiene:  (smells of "crack") Eye Contact: Good Motor Activity: Restlessness Speech: Logical/coherent Level of Consciousness: Alert Mood: Depressed Affect: Appropriate to circumstance Anxiety Level: Minimal Thought Processes: Coherent;Relevant Judgement: Unimpaired Orientation: Person;Place;Time;Situation Obsessive Compulsive Thoughts/Behaviors: None  Cognitive Functioning Concentration: Normal Memory: Recent Intact;Remote Intact IQ: Average Insight: Fair Impulse Control: Fair Appetite: Good Weight Loss: 5  (in three weeks time) Weight Gain: 0  Sleep: Decreased Total Hours of Sleep: 2  Vegetative Symptoms: None  ADLScreening [facility] Assessment Services) Patient's cognitive ability adequate to safely complete daily activities?: Yes Patient able to express need for assistance with ADLs?: Yes Independently performs ADLs?: Yes (appropriate for developmental age)  Abuse/Neglect [facility]) Physical Abuse: Denies Verbal Abuse: Denies Sexual Abuse: Denies  Prior Inpatient Therapy Prior Inpatient Therapy: Yes Prior Therapy Dates: 08/2012  most recent Prior Therapy Facilty/Provider(s): [facility] Reason for Treatment: detox and depression  Prior Outpatient Therapy Prior Outpatient Therapy: Yes Prior Therapy Dates: seen one time at [facility] one month ago Prior Therapy Facilty/Provider(s): [facility] Reason for Treatment: Bipolar by history  ADL Screening  (condition at time of admission) Patient's cognitive ability adequate to safely complete daily activities?: Yes Patient able to express need for assistance with ADLs?: Yes Independently performs ADLs?: Yes (appropriate for developmental age)       Abuse/Neglect Assessment (Assessment to be complete while patient is alone) Physical Abuse: Denies Verbal Abuse: Denies Sexual Abuse: Denies          Additional Information 1:1 In Past 12 Months?: No CIRT Risk: No Elopement Risk: No Does patient have medical clearance?: Yes     Disposition:  Disposition Disposition of Patient: Inpatient treatment program Type of inpatient treatment program: Adult Other disposition(s):  (to [facility] for holding until a bed available here for admission)  On Site Evaluation by:   Reviewed with Physician:     Wynona Luna 01/07/2013 11:38 AM

## 2013-01-07 NOTE — ED Notes (Addendum)
Pt is here waiting for bed a [facility] Pt told intake staff that she is SI no plan, needs drugs detox cocaine

## 2013-01-07 NOTE — Consult Note (Signed)
Reason for Consult: Depression, suicidal ideation, polysubstance abuse Referring Physician: Dr. [name] is an 52 y.o. female.  HPI: Patient was seen and chart reviewed. Patient was initially presented to the [facility] long emergency department requesting detox treatment for crack cocaine. Patient was discharged to the medical clearance and recommended. No cocaine detox treatment is available. Patient went across the street to the [facility] and reported she is being depressed and suicidal with a plan of overdose and than she was sent back to the [facility] long emergency department for psych holding area. Reportedly patient was accepted to provider at behavioral health and waiting for the available bed. Patient has previous history of acute psychiatric hospitalization. Her last hospitalization was September 2013. Patient has history of for suicidal attempt in the past. One overdose and the other one cutting her wrists.. Patient has been homeless and she has of family or family, friends, allowing her to stay. Patient reported she has been feeling lonely, and has a limited support system. Patient has been living in [place] or 6 years. Patient endorses using crack cocaine, marijuana, alcohol. Her urine drug screen was positive for cocaine, opiates, and tetrahydrocannabinol. Patient has served prescription medication Seroquel, but noncompliant since his been relapsed drug of abuse. Patient was client at [facility], last seen her provider is a month ago. Patient has no symptoms of hallucinations, delusions, or paranoia  MSE: Patient has a decrease psychomotor activity. Depressed mood, and constricted affect. Patient has low energy, sweating and has mild tremors. Patient endorses suicidal ideation, but no intention, or plan. Patient has no homicidal ideation, intention, or plan. Patient has no evidence of psychotic symptoms.  Past Medical History  Diagnosis Date  .  Depression   . Bipolar 1 disorder   . Drug abuse     History reviewed. No pertinent past surgical history.  Family History  Problem Relation Age of Onset  . Anesthesia problems Neg Hx   . Hypotension Neg Hx   . Malignant hyperthermia Neg Hx   . Pseudochol deficiency Neg Hx     Social History:  reports that she has been smoking Cigarettes.  She has a 5 pack-year smoking history. She has never used smokeless tobacco. She reports that she drinks alcohol. She reports that she uses illicit drugs ("Crack" cocaine and Marijuana).  Allergies: No Known Allergies  Medications: I have reviewed the patient's current medications.  Results for orders placed during the hospital encounter of 01/06/13 (from the past 48 hour(s))  ACETAMINOPHEN LEVEL     Status: Normal   Collection Time   01/06/13 10:30 PM      Component Value Range Comment   Acetaminophen (Tylenol), Serum <15.0  10 - 30 ug/mL   CBC     Status: Normal   Collection Time   01/06/13 10:30 PM      Component Value Range Comment   WBC 4.7  4.0 - 10.5 K/uL    RBC 4.87  3.87 - 5.11 MIL/uL    Hemoglobin 14.2  12.0 - 15.0 g/dL    HCT 16.1  09.6 - 04.5 %    MCV 87.1  78.0 - 100.0 fL    MCH 29.2  26.0 - 34.0 pg    MCHC 33.5  30.0 - 36.0 g/dL    RDW 40.9  81.1 - 91.4 %    Platelets 267  150 - 400 K/uL   COMPREHENSIVE METABOLIC PANEL     Status: Abnormal   Collection Time  01/06/13 10:30 PM      Component Value Range Comment   Sodium 139  135 - 145 mEq/L    Potassium 3.9  3.5 - 5.1 mEq/L    Chloride 101  96 - 112 mEq/L    CO2 26  19 - 32 mEq/L    Glucose, Bld 139 (*) 70 - 99 mg/dL    BUN 18  6 - 23 mg/dL    Creatinine, Ser 1.61  0.50 - 1.10 mg/dL    Calcium 9.3  8.4 - 09.6 mg/dL    Total Protein 6.9  6.0 - 8.3 g/dL    Albumin 3.4 (*) 3.5 - 5.2 g/dL    AST 27  0 - 37 U/L    ALT 18  0 - 35 U/L    Alkaline Phosphatase 88  39 - 117 U/L    Total Bilirubin 0.3  0.3 - 1.2 mg/dL    GFR calc non Af Amer 59 (*) >90 mL/min    GFR calc  Af Amer 69 (*) >90 mL/min   ETHANOL     Status: Normal   Collection Time   01/06/13 10:30 PM      Component Value Range Comment   Alcohol, Ethyl (B) <11  0 - 11 mg/dL   SALICYLATE LEVEL     Status: Abnormal   Collection Time   01/06/13 10:30 PM      Component Value Range Comment   Salicylate Lvl <2.0 (*) 2.8 - 20.0 mg/dL   PREGNANCY, URINE     Status: Normal   Collection Time   01/06/13 10:40 PM      Component Value Range Comment   Preg Test, Ur NEGATIVE  NEGATIVE   URINE RAPID DRUG SCREEN (HOSP PERFORMED)     Status: Abnormal   Collection Time   01/06/13 10:47 PM      Component Value Range Comment   Opiates POSITIVE (*) NONE DETECTED    Cocaine POSITIVE (*) NONE DETECTED    Benzodiazepines NONE DETECTED  NONE DETECTED    Amphetamines NONE DETECTED  NONE DETECTED    Tetrahydrocannabinol POSITIVE (*) NONE DETECTED    Barbiturates NONE DETECTED  NONE DETECTED     No results found.  Positive for anxiety, bad mood, excessive alcohol consumption, illegal drug usage, mood swings, sleep disturbance and tobacco use Blood pressure 115/72, pulse 82, temperature 98.3 F (36.8 C), temperature source Oral, resp. rate 18, SpO2 98.00%.   Assessment/Plan: Polysubstance abuse versus dependence. Substance-induced mood disorder. Bipolar disorder, most recent episode is depression.  Recommended acute psychiatric hospitalization for crisis stabilization and medication management. May start Klonopin 0.1 mg twice daily as needed.  [name],Stephanie R. 01/07/2013, 4:28 PM

## 2013-01-07 NOTE — ED Notes (Signed)
Pt belonging are in the dayroom one lg plastic bag

## 2013-01-07 NOTE — ED Notes (Signed)
Per Sedalia Muta, Charge RN whom spoke with Dr. Freida Busman, no need to redraw blood, after pt wanded, move back to Psych ED, Barbie Haggis ED RN aware. Security called to wand pt, will monitor.

## 2013-01-07 NOTE — BH Assessment (Signed)
Assessment Note   [name] is a 52 y.o. female who presents to [facility] for detox from Cocaine(DOC), THC and Alcohol. Pt told this writer that she has tried to harm self 10x's in the past by overdose and slitting wrist but currently denies any SI/HI/Psych.  Pt says she uses $50 of Cocaine, daily, last use was 01/06/13; THC, 1 joint daily, last use was 01/06/13 and 6pk Alcohol daily, last drink was 1 wk ago.  This writer advised pt that no detox treatment for Cocaine and outpt referrals can be provided for rehab.  This Clinical research associate talked with Dr. Norlene Campbell regarding disposition.  Dr. Norlene Campbell agreed with disposition.  Pt will be d/c'd  Axis I: Bipolar D/O; Polysub Abuse Axis II: Deferred Axis III:  Past Medical History  Diagnosis Date  . Depression   . Bipolar 1 disorder    Axis IV: other psychosocial or environmental problems, problems related to social environment and problems with primary support group Axis V: 51-60 moderate symptoms  Past Medical History:  Past Medical History  Diagnosis Date  . Depression   . Bipolar 1 disorder     History reviewed. No pertinent past surgical history.  Family History:  Family History  Problem Relation Age of Onset  . Anesthesia problems Neg Hx   . Hypotension Neg Hx   . Malignant hyperthermia Neg Hx   . Pseudochol deficiency Neg Hx     Social History:  reports that she has been smoking Cigarettes.  She has a 5 pack-year smoking history. She has never used smokeless tobacco. She reports that she drinks alcohol. She reports that she uses illicit drugs ("Crack" cocaine).  Additional Social History:  Alcohol / Drug Use Pain Medications: None  Prescriptions: None  Over the Counter: None  Longest period of sobriety (when/how long): Unk  Negative Consequences of Use: Personal relationships;Financial Withdrawal Symptoms: Other (Comment) (No w/d sxs at this time )  CIWA: CIWA-Ar BP: 126/72 mmHg Pulse Rate: 72  COWS:    Allergies: No Known  Allergies  Home Medications:  (Not in a hospital admission)  OB/GYN Status:  No LMP recorded. Patient is not currently having periods (Reason: Perimenopausal).  General Assessment Data Location of Assessment: [facility] Living Arrangements: Non-relatives/Friends (Renting a room ) Can pt return to current living arrangement?: Yes Admission Status: Voluntary Is patient capable of signing voluntary admission?: Yes Transfer from: Acute Hospital Referral Source: MD  Education Status Is patient currently in school?: No Current Grade: None  Highest grade of school patient has completed: None  Name of school: None  Contact person: None   Risk to self Suicidal Ideation: No Suicidal Intent: No Is patient at risk for suicide?: No Suicidal Plan?: No Access to Means: No What has been your use of drugs/alcohol within the last 12 months?: Abusing: cocaine, THC, alcohol  Previous Attempts/Gestures: Yes How many times?: 10  Other Self Harm Risks: None  Triggers for Past Attempts: Unpredictable Intentional Self Injurious Behavior: None Family Suicide History: No Recent stressful life event(s): Other (Comment) (Chronic SA; Depression due to Holidays ) Persecutory voices/beliefs?: No Depression: Yes Depression Symptoms: Loss of interest in usual pleasures Substance abuse history and/or treatment for substance abuse?: Yes Suicide prevention information given to non-admitted patients: Not applicable  Risk to Others Homicidal Ideation: No Thoughts of Harm to Others: No Current Homicidal Intent: No Current Homicidal Plan: No Access to Homicidal Means: No Identified Victim: None  History of harm to others?: No Assessment of Violence: None Noted Violent  Behavior Description: None  Does patient have access to weapons?: No Criminal Charges Pending?: No Does patient have a court date: No  Psychosis Hallucinations: None noted Delusions: None noted  Mental Status Report Appear/Hygiene:  Disheveled;Poor hygiene Eye Contact: Poor Motor Activity: Tics Speech: Logical/coherent Level of Consciousness: Alert Mood: Other (Comment) (Appropriate ) Affect: Appropriate to circumstance Anxiety Level: None Thought Processes: Coherent;Relevant Judgement: Unimpaired Orientation: Person;Place;Time;Situation Obsessive Compulsive Thoughts/Behaviors: None  Cognitive Functioning Concentration: Normal Memory: Recent Intact;Remote Intact IQ: Average Insight: Fair Impulse Control: Fair Appetite: Good Weight Loss: 0  Weight Gain: 0  Sleep: No Change Total Hours of Sleep: 5  Vegetative Symptoms: None  ADLScreening [facility] Assessment Services) Patient's cognitive ability adequate to safely complete daily activities?: Yes Patient able to express need for assistance with ADLs?: Yes Independently performs ADLs?: Yes (appropriate for developmental age)  Abuse/Neglect [facility]) Physical Abuse: Denies Verbal Abuse: Denies Sexual Abuse: Denies  Prior Inpatient Therapy Prior Inpatient Therapy: Yes Prior Therapy Dates: 2013 Prior Therapy Facilty/Provider([facility]): [facility]  Reason for Treatment: Detox   Prior Outpatient Therapy Prior Outpatient Therapy: No Prior Therapy Dates: None  Prior Therapy Facilty/Provider(s): None  Reason for Treatment: None   ADL Screening (condition at time of admission) Patient's cognitive ability adequate to safely complete daily activities?: Yes Patient able to express need for assistance with ADLs?: Yes Independently performs ADLs?: Yes (appropriate for developmental age) Weakness of Legs: None Weakness of Arms/Hands: None  Home Assistive Devices/Equipment Home Assistive Devices/Equipment: None  Therapy Consults (therapy consults require a physician order) PT Evaluation Needed: No OT Evalulation Needed: No SLP Evaluation Needed: No Abuse/Neglect Assessment (Assessment to be complete while patient is alone) Physical Abuse: Denies Verbal Abuse: Denies Sexual  Abuse: Denies Exploitation of patient/patient's resources: Denies Self-Neglect: Denies Values / Beliefs Cultural Requests During Hospitalization: None Spiritual Requests During Hospitalization: None Consults Spiritual Care Consult Needed: No Social Work Consult Needed: No Merchant navy officer (For Healthcare) Advance Directive: Patient does not have advance directive;Patient would not like information Pre-existing out of facility DNR order (yellow form or pink MOST form): No Nutrition Screen- [facility] Adult/[facility] Patient's home diet: Regular Have you recently lost weight without trying?: No Have you been eating poorly because of a decreased appetite?: No Malnutrition Screening Tool Score: 0   Additional Information 1:1 In Past 12 Months?: No CIRT Risk: No Elopement Risk: No Does patient have medical clearance?: Yes     Disposition:  Disposition Disposition of Patient: Other dispositions (Referrals provided for out therapy ) Other disposition(s): Information only (Referrals provided )  On Site Evaluation by:   Reviewed with Physician:     Murrell Redden 01/07/2013 6:07 AM

## 2013-01-08 ENCOUNTER — Encounter (HOSPITAL_COMMUNITY): Payer: Self-pay | Admitting: Psychiatry

## 2013-01-08 DIAGNOSIS — F1420 Cocaine dependence, uncomplicated: Secondary | ICD-10-CM | POA: Diagnosis present

## 2013-01-08 DIAGNOSIS — F319 Bipolar disorder, unspecified: Secondary | ICD-10-CM

## 2013-01-08 MED ORDER — ENSURE COMPLETE PO LIQD
237.0000 mL | Freq: Two times a day (BID) | ORAL | Status: DC
  Administered 2013-01-08 – 2013-01-15 (×7): 237 mL via ORAL

## 2013-01-08 MED ORDER — QUETIAPINE FUMARATE 50 MG PO TABS
ORAL_TABLET | ORAL | Status: AC
  Filled 2013-01-08: qty 1

## 2013-01-08 MED ORDER — ADULT MULTIVITAMIN W/MINERALS CH
1.0000 | ORAL_TABLET | Freq: Every day | ORAL | Status: DC
  Filled 2013-01-08 (×2): qty 1

## 2013-01-08 MED ORDER — QUETIAPINE FUMARATE 50 MG PO TABS
50.0000 mg | ORAL_TABLET | Freq: Two times a day (BID) | ORAL | Status: DC
  Administered 2013-01-08 – 2013-01-16 (×17): 50 mg via ORAL
  Filled 2013-01-08 (×3): qty 1
  Filled 2013-01-08: qty 8
  Filled 2013-01-08 (×15): qty 1
  Filled 2013-01-08: qty 8

## 2013-01-08 NOTE — Progress Notes (Signed)
Nutrition Brief Note  Pt meets criteria for severe MALNUTRITION in the context of acute illness as evidenced by 0% energy intake with 3.9% weight loss during 2 weeks prior to admission per pt report.   Patient identified on the Malnutrition Screening Tool (MST) Report  Body mass index is 24.44 kg/(m^2). Patient meets criteria for normal weight based on current BMI.   Pt reports for 2 weeks PTA she was not eating/drinking anything, not even water. Pt reports 5 pound unintended weight loss during that time frame. Pt reports she has been getting Ensure and multivitamins since admission and she has been eating better. Pt without any nutrition concerns.   No nutrition interventions warranted at this time. If nutrition issues arise, please consult RD.   Levon Hedger MS, RD, LDN [phone] Pager [phone] After Hours Pager

## 2013-01-08 NOTE — Progress Notes (Signed)
Patient ID: [name], female   DOB: September 30, 1961, 52 y.o.   MRN: [id number] 52 y/o female who presents voluntarily for substance abuse, suicidal ideations, and medication compliance.  Patient states she is currently having suicidal ideations on and off but verbally contracts for safety.  Patient denies HI and denies AVH.  Patient states she is currently living with a friend.  Patient states she uses crack cocaine daily, marijuana a couple times and week and states she drinks alcohol once a week.  Patient states she has been in and out of detox facilities for years.  Patient state this is her safe place and patient states she wants to stop using crack and wants help with her suicidal thoughts.  Patient states she has a history of substance abuse and mental disorder.  Pateint denies surgical history.  Patient skin assessed and patient has birthmark on her back.  Patient calm and cooperative during admission process but fidgety and restless.  POC, treatment agreement, fall safety plan explained and patient verbalized understanding.  Patient escorted and oriented to the unit by Clinical research associate.  Patient belongings secured in a black trash bag behind partition in the search room.

## 2013-01-08 NOTE — Progress Notes (Signed)
Patient ID: [name], female   DOB: [birth year], 52 y.o.   MRN: [id number] D: Pt. Lying in bed awake, but reports "I'm sleepy now". Pt. Reports depression at "6" of 10. Pt. Reports wants to contact friend in [place] after discharge. A: Writer provided emotional support by listening and encouraging pt. To verbalize feeling, continue detox by attending groups and seeking positive support system. Staff will monitor q56min for safety. Staff encourages group. R: Pt. Is safe on the unit. Pt. Did not attend group.
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
## 2013-01-08 NOTE — Progress Notes (Signed)
D: Patient resting in bed with eyes closed.  Respirations even and unlabored.  Patient appears to be in no apparent distress. A: Staff to monitor Q 15 mins for safety.   R:Patient remains safe on the unit.  

## 2013-01-08 NOTE — Progress Notes (Signed)
Patient ID: [name], female   DOB: [birth year], 52 y.o.   MRN: [id number] She has been up and and about and to groups interacting with peers and staff.   She was more agitated this AM but has been calm and cooperative this PM.
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
## 2013-01-08 NOTE — Progress Notes (Signed)
[facility] Group Notes:  (Counselor/Nursing/MHT/Case Management/Adjunct)  Type of Therapy:  Psychoeducational Skills  Participation Level:  Did Not Attend  Summary of Progress/Problems: [name] did not attend psychoeducational group that focused on using quality time with support systems/individuals to engage in health coping skills.   Wandra Scot 01/08/2013 5:55 PM

## 2013-01-09 DIAGNOSIS — F1410 Cocaine abuse, uncomplicated: Secondary | ICD-10-CM

## 2013-01-09 DIAGNOSIS — F1910 Other psychoactive substance abuse, uncomplicated: Secondary | ICD-10-CM

## 2013-01-09 NOTE — Clinical Social Work Note (Signed)
[facility] LCSW Group Therapy  Living a Balanced Life 1:15 - 2:30 PM          01/09/2013  3:26 PM   Type of Therapy:  Group Therapy  Patient entered group but did not stay for presentation from [facility].  Summary of Progress/Problems:  [name] 01/09/2013 3:26 PM

## 2013-01-09 NOTE — Progress Notes (Signed)
Patient ID: [name], female   DOB: 20-Feb-1961, 52 y.o.   MRN: [id number] D: Pt is awake and active on the unit this PM. Pt denies SI/HI and A/V hallucinations. Pt is participating in the milieu and is cooperative with staff. Pt rates their depression at 7 and hopelessness at 7. Pt's most recent COWS score was 3. Pt mood is depressed and her affect is sad/anxious. Pt writes that she plans to stay on her medications after discharge. Pt also reports feeling much better now that her withdrawal symptoms have subsided. Pt says that she will attend more groups because she feels better. Pt has been spending much of her time in the room isolating until now.   A: Writer utilized therapeutic communication, encouraged pt to discuss feelings with staff and administered medication per MD orders. Writer also encouraged pt to attend groups.  R: Pt is attending groups and tolerating medications well. Writer will continue to monitor. 15 minute checks are ongoing for safety.

## 2013-01-09 NOTE — Progress Notes (Signed)
[facility] MD Progress Note  01/09/2013 9:35 AM [name]  MRN:  [id number]  Subjective:  "I got very depressed during the holidays and stayed depressed afterwards because I was lonely. My daughter moved back to [place] last July 2013. As my depression deepens, I started to use cocaine. I stopped taking my regular medicines because I know that they do interfere with cocaine. Drugs do not solve any problems for me, rather it helps me to escape reality for just a short time. I am trying to get well and may be move back to [place] with my daughter and or go to [place]".  Diagnosis:   Axis I: Cocaine abuse, Polysubstance absue Axis II: Deferred Axis III:  Past Medical History  Diagnosis Date  . Depression   . Bipolar 1 disorder   . Drug abuse    Axis IV: economic problems, housing problems, occupational problems and other psychosocial or environmental problems Axis V: 41-50 serious symptoms  ADL's:  Fair  Sleep: Good  Appetite:  Good  Suicidal Ideation: "No" Plan:  No Intent:  No Means:  No Homicidal Ideation: "No" Plan:  No Intent:  No Means:  No  AEB (as evidenced by): Per patient's reports.  Psychiatric Specialty Exam: Review of Systems  Constitutional: Negative.   HENT: Negative.   Eyes: Negative.   Respiratory: Negative.   Cardiovascular: Negative.   Gastrointestinal: Negative.   Genitourinary: Negative.   Musculoskeletal: Negative.   Skin: Negative.   Neurological: Negative.   Endo/Heme/Allergies: Negative.   Psychiatric/Behavioral: Positive for depression (Currently being stabilized with medication.) and substance abuse (Hx cocaine abuse). Negative for suicidal ideas, hallucinations and memory loss. The patient is not nervous/anxious and does not have insomnia.     Blood pressure 131/83, pulse 73, temperature 98.1 F (36.7 C), temperature source Oral, resp. rate 16, height 4\' 11"  (1.499 m), weight 54.885 kg (121 lb).Body mass index is 24.44 kg/(m^2).  General  Appearance: Disheveled  Eye Solicitor::  Fair  Speech:  Clear and Coherent  Volume:  Normal  Mood:  Depressed and rated @ #7  Affect:  Appropriate  Thought Process:  Coherent and Intact  Orientation:  Full (Time, Place, and Person)  Thought Content:  Rumination and denies hallucinations.  Suicidal Thoughts:  No  Homicidal Thoughts:  No  Memory:  Immediate;   Good Recent;   Good Remote;   Good  Judgement:  Fair  Insight:  Good  Psychomotor Activity:  Normal  Concentration:  Good  Recall:  Good  Akathisia:  No  Handed:  Right  AIMS (if indicated):     Assets:  Communication Skills Desire for Improvement  Sleep:  Number of Hours: 6    Current Medications: Current Facility-Administered Medications  Medication Dose Route Frequency Provider Last Rate Last Dose  . acetaminophen (TYLENOL) tablet 650 mg  650 mg Oral Q6H PRN Kerry Hough, PA      . alum & mag hydroxide-simeth (MAALOX/MYLANTA) 200-200-20 MG/5ML suspension 30 mL  30 mL Oral Q4H PRN Kerry Hough, PA      . chlordiazePOXIDE (LIBRIUM) capsule 25 mg  25 mg Oral QID PRN Kerry Hough, PA      . cloNIDine (CATAPRES) tablet 0.1 mg  0.1 mg Oral QID Kerry Hough, PA   0.1 mg at 01/09/13 0749   Followed by  . cloNIDine (CATAPRES) tablet 0.1 mg  0.1 mg Oral BH-qamhs Kerry Hough, PA       Followed by  .  cloNIDine (CATAPRES) tablet 0.1 mg  0.1 mg Oral QAC breakfast Kerry Hough, PA      . dicyclomine (BENTYL) tablet 20 mg  20 mg Oral Q6H PRN Kerry Hough, PA      . feeding supplement (ENSURE COMPLETE) liquid 237 mL  237 mL Oral BID BM Rachael Fee, MD   237 mL at 01/08/13 1322  . loperamide (IMODIUM) capsule 2-4 mg  2-4 mg Oral PRN Kerry Hough, PA      . magnesium hydroxide (MILK OF MAGNESIA) suspension 30 mL  30 mL Oral Daily PRN Kerry Hough, PA      . methocarbamol (ROBAXIN) tablet 500 mg  500 mg Oral Q8H PRN Kerry Hough, PA      . multivitamin with minerals tablet 1 tablet  1 tablet Oral Daily  Kerry Hough, PA   1 tablet at 01/09/13 0749  . naproxen (NAPROSYN) tablet 500 mg  500 mg Oral BID PRN Kerry Hough, PA      . nicotine (NICODERM CQ - dosed in mg/24 hours) patch 21 mg  21 mg Transdermal Q0600 Kerry Hough, PA      . ondansetron (ZOFRAN-ODT) disintegrating tablet 4 mg  4 mg Oral Q6H PRN Kerry Hough, PA      . QUEtiapine (SEROQUEL) tablet 400 mg  400 mg Oral QHS Kerry Hough, PA   400 mg at 01/08/13 2326  . QUEtiapine (SEROQUEL) tablet 50 mg  50 mg Oral BID Rachael Fee, MD   50 mg at 01/09/13 1610    Lab Results: No results found for this or any previous visit (from the past 48 hour(s)).  Physical Findings: AIMS: Facial and Oral Movements Muscles of Facial Expression: None, normal Lips and Perioral Area: None, normal Jaw: None, normal Tongue: None, normal,Extremity Movements Upper (arms, wrists, hands, fingers): None, normal Lower (legs, knees, ankles, toes): None, normal, Trunk Movements Neck, shoulders, hips: None, normal, Overall Severity Severity of abnormal movements (highest score from questions above): None, normal Incapacitation due to abnormal movements: None, normal Patient's awareness of abnormal movements (rate only patient's report): No Awareness, Dental Status Current problems with teeth and/or dentures?: Yes (missing teeth) Does patient usually wear dentures?: No  CIWA:  CIWA-Ar Total: 1  COWS:     Treatment Plan Summary: Daily contact with patient to assess and evaluate symptoms and progress in treatment Medication management  Plan: Supportive approach/coping skills/relapse prevention. Encouraged group counseling session participation.  No new changes made on the current treatment plan.  Continue plan of care.    Medical Decision Making Problem Points:  Established problem, stable/improving (1), Review of last therapy session (1) and Review of psycho-social stressors (1) Data Points:  Review or order clinical lab tests  (1) Review of medication regiment & side effects (2) Review of new medications or change in dosage (2)  I certify that inpatient services furnished can reasonably be expected to improve the patient's condition.   Armandina Stammer I 01/09/2013, 9:35 AM

## 2013-01-09 NOTE — Clinical Social Work Note (Signed)
[facility] LCSW Aftercare Discharge Planning Group Note       8:30-9:30 AM  1/16/20143:24 PM  Participation Quality:  Appropriate  Affect:  Appropriate  Cognitive:  Appropriate  Insight:  Engaged  Engagement in Group:  Engaged  Modes of Intervention:  Education, Exploration, Problem-solving and Support  Summary of Progress/Problems:  Patient reported feeling better today.  She endorses off/on SI but able to contract.  She rates all symptoms at ten.  [name] 01/09/2013 3:24 PM

## 2013-01-09 NOTE — Progress Notes (Signed)
Pt did not attend 11am psycho educational group 

## 2013-01-09 NOTE — Progress Notes (Signed)
  D) Patient pleasant and cooperative upon my assessment. Patient completed Patient Self Inventory, reports slept "fair," and  appetite is "improving." Patient rates depression as  7/10, patient rates hopeless feelings as  7/10. Patient endorses "off and on" SI, contracts verbally for safety with staff. Patient denies HI, denies A/V hallucinations.   A) Patient offered support and encouragement, patient encouraged to discuss feelings/concerns with staff. Patient verbalized understanding. Patient monitored Q15 minutes for safety. Patient met with MD  to discuss today's goals and plan of care.  R) Patient isolates to room at times. Patient does attend meals in dining room. Patient appropriate with staff and peers.   Patient taking medications as ordered. Patient has a plan to "stay on meds" after discharge from [facility]. Will continue to monitor.

## 2013-01-10 NOTE — Clinical Social Work Note (Signed)
[facility] LCSW Aftercare Discharge Planning Group Note       8:30-9:30 AM  1/17/20148:35 AM  Participation Quality:  Appropriate  Affect:  Appropriate  Cognitive:  Appropriate  Insight:  Engaged  Engagement in Group:  Engaged  Modes of Intervention:  Education, Exploration, Problem-solving and Support  Summary of Progress/Problems:  Patient advised of being better today and denied SI/HI.  She rated all symptoms at five.  [name] 01/10/2013 8:35 AM

## 2013-01-10 NOTE — Progress Notes (Signed)
Adult Psychoeducational Group Note  Date:  01/10/2013 Time:  2000   Group Topic/Focus:  Wrap-Up Group:   The focus of this group is to help patients review their daily goal of treatment and discuss progress on daily workbooks.  Participation Level:  Didn't attend   Participation Quality:    Affect:    Cognitive:    Insight:   Engagement in Group:    Modes of Intervention:    Additional Comments:  Pt didn't attend karaoke this evening.   Theodoros Stjames A 01/10/2013, 1:16 AM
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
## 2013-01-10 NOTE — Progress Notes (Signed)
8 pm Wrap up group: Pt stated name and gave a brief description about how pt day has been going. Pt main focus was to return back to the movie that pt and peers was watching before group started. 

## 2013-01-10 NOTE — Progress Notes (Signed)
Adult Psychoeducational Group Note  Date:  01/10/2013 Time:  1000  Group Topic/Focus:  Relapse Prevention Planning:   The focus of this group is to define relapse and discuss the need for planning to combat relapse.  Participation Level:  Did Not Attend   Additional Comments:  Pt was sleeping in her room and did not attend group.  Gwyndolyn Kaufman 01/10/2013, 12:01 PM

## 2013-01-10 NOTE — Progress Notes (Signed)
Pt observed resting in bed with eyes closed. RR WNL, even and unlabored. No distress noted, complaints voiced. Level III obs in place and pt is safe. [name]

## 2013-01-10 NOTE — Progress Notes (Signed)
Patient ID: [name], female   DOB: May 17, 1961, 52 y.o.   MRN: [id number] [facility] MD Progress Note  01/10/2013 2:27 PM Rosezetta Balderston  MRN:  [id number]  Subjective:  "I had just came out of the group session to take a break in my room. We are talking about relapse prevention, which is a boring topic for me. My mood is not so bad. But I have my moments".  Diagnosis:   Axis I: Cocaine abuse, Polysubstance absue Axis II: Deferred Axis III:  Past Medical History  Diagnosis Date  . Depression   . Bipolar 1 disorder   . Drug abuse    Axis IV: economic problems, housing problems, occupational problems and other psychosocial or environmental problems Axis V: 41-50 serious symptoms  ADL's:  Fair  Sleep: Good  Appetite:  Good  Suicidal Ideation: "No" Plan:  No Intent:  No Means:  No Homicidal Ideation: "No" Plan:  No Intent:  No Means:  No  AEB (as evidenced by): Per patient's reports.  Psychiatric Specialty Exam: Review of Systems  Constitutional: Negative.   HENT: Negative.   Eyes: Negative.   Respiratory: Negative.   Cardiovascular: Negative.   Gastrointestinal: Negative.   Genitourinary: Negative.   Musculoskeletal: Negative.   Skin: Negative.   Neurological: Negative.   Endo/Heme/Allergies: Negative.   Psychiatric/Behavioral: Positive for depression (Currently being stabilized with medication.) and substance abuse (Hx cocaine abuse). Negative for suicidal ideas, hallucinations and memory loss. The patient is not nervous/anxious and does not have insomnia.     Blood pressure 117/79, pulse 73, temperature 97.9 F (36.6 C), temperature source Oral, resp. rate 16, height 4\' 11"  (1.499 m), weight 54.885 kg (121 lb).Body mass index is 24.44 kg/(m^2).  General Appearance: Disheveled  Eye Solicitor::  Fair  Speech:  Clear and Coherent  Volume:  Normal  Mood:  "My mood is not so bad today, but I have my moments".  Affect:  Flat, blunt.  Thought Process:  Coherent and  Intact  Orientation:  Full (Time, Place, and Person)  Thought Content:  Rumination and denies hallucinations.  Suicidal Thoughts:  No  Homicidal Thoughts:  No  Memory:  Immediate;   Good Recent;   Good Remote;   Good  Judgement:  Fair  Insight:  Good  Psychomotor Activity:  Normal  Concentration:  Good  Recall:  Good  Akathisia:  No  Handed:  Right  AIMS (if indicated):     Assets:  Communication Skills Desire for Improvement  Sleep:  Number of Hours: 5    Current Medications: Current Facility-Administered Medications  Medication Dose Route Frequency Provider Last Rate Last Dose  . acetaminophen (TYLENOL) tablet 650 mg  650 mg Oral Q6H PRN Kerry Hough, PA      . alum & mag hydroxide-simeth (MAALOX/MYLANTA) 200-200-20 MG/5ML suspension 30 mL  30 mL Oral Q4H PRN Kerry Hough, PA      . chlordiazePOXIDE (LIBRIUM) capsule 25 mg  25 mg Oral QID PRN Kerry Hough, PA      . cloNIDine (CATAPRES) tablet 0.1 mg  0.1 mg Oral BH-qamhs Kerry Hough, PA       Followed by  . cloNIDine (CATAPRES) tablet 0.1 mg  0.1 mg Oral QAC breakfast Kerry Hough, PA      . dicyclomine (BENTYL) tablet 20 mg  20 mg Oral Q6H PRN Kerry Hough, PA      . feeding supplement (ENSURE COMPLETE) liquid 237 mL  237 mL Oral BID BM  Rachael Fee, MD   237 mL at 01/09/13 1728  . loperamide (IMODIUM) capsule 2-4 mg  2-4 mg Oral PRN Kerry Hough, PA      . magnesium hydroxide (MILK OF MAGNESIA) suspension 30 mL  30 mL Oral Daily PRN Kerry Hough, PA      . methocarbamol (ROBAXIN) tablet 500 mg  500 mg Oral Q8H PRN Kerry Hough, PA      . multivitamin with minerals tablet 1 tablet  1 tablet Oral Daily Kerry Hough, PA   1 tablet at 01/10/13 1610  . naproxen (NAPROSYN) tablet 500 mg  500 mg Oral BID PRN Kerry Hough, PA      . nicotine (NICODERM CQ - dosed in mg/24 hours) patch 21 mg  21 mg Transdermal Q0600 Kerry Hough, PA      . ondansetron (ZOFRAN-ODT) disintegrating tablet 4 mg  4 mg  Oral Q6H PRN Kerry Hough, PA      . QUEtiapine (SEROQUEL) tablet 400 mg  400 mg Oral QHS Kerry Hough, PA   400 mg at 01/09/13 2155  . QUEtiapine (SEROQUEL) tablet 50 mg  50 mg Oral BID Rachael Fee, MD   50 mg at 01/10/13 9604    Lab Results: No results found for this or any previous visit (from the past 48 hour(s)).  Physical Findings: AIMS: Facial and Oral Movements Muscles of Facial Expression: None, normal Lips and Perioral Area: None, normal Jaw: None, normal Tongue: None, normal,Extremity Movements Upper (arms, wrists, hands, fingers): None, normal Lower (legs, knees, ankles, toes): None, normal, Trunk Movements Neck, shoulders, hips: None, normal, Overall Severity Severity of abnormal movements (highest score from questions above): None, normal Incapacitation due to abnormal movements: None, normal Patient's awareness of abnormal movements (rate only patient's report): No Awareness, Dental Status Current problems with teeth and/or dentures?: Yes (missing teeth) Does patient usually wear dentures?: No  CIWA:  CIWA-Ar Total: 0  COWS:     Treatment Plan Summary: Daily contact with patient to assess and evaluate symptoms and progress in treatment Medication management  Plan: Supportive approach/coping skills/relapse prevention. Supportive approach/coping skills. Encouraged out of room, participation in group sessions and application of coping skills. Will continue to monitor response to/adverse effects of medications in use to assure effectiveness. Continue to monitor mood and behavior. Continue current plan of care.    Medical Decision Making Problem Points:  Established problem, stable/improving (1), Review of last therapy session (1) and Review of psycho-social stressors (1) Data Points:  Review or order clinical lab tests (1) Review of medication regiment & side effects (2) Review of new medications or change in dosage (2)  I certify that inpatient services  furnished can reasonably be expected to improve the patient's condition.   Armandina Stammer I 01/10/2013, 2:27 PM

## 2013-01-10 NOTE — Progress Notes (Signed)
D: Patient appropriate and cooperative with staff and peers. Sad at times, brightens on approach. She reported on self inventory sheet that her appetite is good, energy level is normal, and ability to pay attention is improving. Patient rated depression and feelings of hopelessness "5".  A: Support and encouragement provided to patient. Scheduled medications administered per ordering MD. Monitor Q15 minute checks for safety.  R: Patient receptive. Denies SI/HI. Patient remains safe.

## 2013-01-10 NOTE — Tx Team (Signed)
Interdisciplinary Treatment Plan Update (Adult)  Date:  01/10/2013  Time Reviewed:  11:23 AM   Progress in Treatment: Attending groups:   Yes   Participating in groups:  Yes Taking medication as prescribed:  Yes Tolerating medication:  Yes Family/Significant othe contact made: Contact made with family Patient understands diagnosis:  Yes Discussing patient identified problems/goals with staff: Yes Medical problems stabilized or resolved: Yes Denies suicidal/homicidal ideation:Yes Issues/concerns per patient self-inventory:  Other:   New problem(s) identified:  Reason for Continuation of Hospitalization: Anxiety Depression Medication stabilization Suicidal ideation  Interventions implemented related to continuation of hospitalization:  Medication Management; safety checks q 15 mins  Additional comments:  Estimated length of stay:  2-3 days  Discharge Plan:  Home with outpatient follow up  New goal(s):  Review of initial/current patient goals per problem list:    1.  Goal(s): Eliminate SI/other thoughts of self harm   Met:  Yes  Target date: d/c  As evidenced by: Patient no longer endorsing SI/HI or other thoughts of self harm.    2.  Goal (s):Reduce depression/anxiety  Met: Yes  Target date: d/c  As evidenced by: Patient will rate symptoms at four or below    3.  Goal(s):.stabilize on meds   Met:  No  Target date: d/c  As evidenced by: Patient will report being stabilized on medications - less symptomatic    4.  Goal(s): Refer for outpatient follow up   Met:  No  Target date: d/c  As evidenced by: Follow up appointment scheduled    Attendees: Patient:   01/10/2013 11:23 AM  Physican:  Patrick North, MD 01/10/2013 11:23 AM  Nursing:   Nestor Ramp, RN 01/10/2013 11:23 AM   Nursing:    Harold Barban, RN 01/10/2013 11:23 AM   Clinical Social Worker:  Juline Patch, LCSW 01/10/2013 11:23 AM   Other: Tera Helper, PHM-NP 01/10/2013 11:23 AM    Other:         01/10/2013 11:23 AM Other:        01/10/2013 11:23 AM

## 2013-01-11 NOTE — Progress Notes (Signed)
Adult Psychoeducational Group Note  Date:  01/11/2013 Time:  1400  Group Topic/Focus:  Healthy Communication:   The focus of this group is to discuss communication, barriers to communication, as well as healthy ways to communicate with others.  Participation Level:  Active  Participation Quality:  Appropriate and Sharing  Affect:  Depressed  Cognitive:  Alert and Appropriate  Insight: Improving  Engagement in Group:  Improving  Modes of Intervention:  Discussion, Problem-solving and Role-play  Additional Comments:    Barbette Merino, Pressley Barsky Shari Prows 01/11/2013, 2:48 PM

## 2013-01-11 NOTE — Clinical Social Work Psychosocial (Signed)
[facility] Group Notes: (Clinical Social Work)   01/11/2013      Type of Therapy:  Group Therapy   Participation Level:  Did Not Attend    Ambrose Mantle, LCSW 01/11/2013, 6:04 PM

## 2013-01-11 NOTE — Progress Notes (Signed)
[facility] MD Progress Note  01/11/2013 4:00 PM [name]  MRN:  [id number]  Subjective:   No tremor shakes have stopped but didn't sleep well. Thought it was due to the clonidine. Says she knows the message her body is giving her - "you ain't a spring chicken and you need to stop running the streets." Unsure if she wants a long term SA rehab program. Diagnosis:   Axis I: Cocaine abuse, Polysubstance absue Axis II: Deferred Axis III:  Past Medical History  Diagnosis Date  . Depression   . Bipolar 1 disorder   . Drug abuse    Axis IV: economic problems, housing problems, occupational problems and other psychosocial or environmental problems Axis V: 41-50 serious symptoms  ADL's:  Fair  Sleep: Good  Appetite:  Good  Suicidal Ideation: "No" Plan:  No Intent:  No Means:  No Homicidal Ideation: "No" Plan:  No Intent:  No Means:  No  AEB (as evidenced by): Per patient's reports.  Psychiatric Specialty Exam: Review of Systems  Constitutional: Negative.   HENT: Negative.   Eyes: Negative.   Respiratory: Negative.   Cardiovascular: Negative.   Gastrointestinal: Negative.   Genitourinary: Negative.   Musculoskeletal: Negative.   Skin: Negative.   Neurological: Negative.   Endo/Heme/Allergies: Negative.   Psychiatric/Behavioral: Positive for depression (Currently being stabilized with medication.) and substance abuse (Hx cocaine abuse). Negative for suicidal ideas, hallucinations and memory loss. The patient is not nervous/anxious and does not have insomnia.     Blood pressure 120/71, pulse 83, temperature 98 F (36.7 C), temperature source Oral, resp. rate 18, height 4\' 11"  (1.499 m), weight 54.885 kg (121 lb).Body mass index is 24.44 kg/(m^2).  General Appearance: Disheveled  Eye Solicitor::  Fair  Speech:  Clear and Coherent  Volume:  Normal  Mood:  "My mood is not so bad today, but I have my moments".  Affect:  Flat, blunt.  Thought Process:  Coherent and Intact    Orientation:  Full (Time, Place, and Person)  Thought Content:  Rumination and denies hallucinations.  Suicidal Thoughts:  No  Homicidal Thoughts:  No  Memory:  Immediate;   Good Recent;   Good Remote;   Good  Judgement:  Fair  Insight:  Good  Psychomotor Activity:  Normal  Concentration:  Good  Recall:  Good  Akathisia:  No  Handed:  Right  AIMS (if indicated):     Assets:  Communication Skills Desire for Improvement  Sleep:  Number of Hours: 6.75    Current Medications: Current Facility-Administered Medications  Medication Dose Route Frequency Provider Last Rate Last Dose  . acetaminophen (TYLENOL) tablet 650 mg  650 mg Oral Q6H PRN Kerry Hough, PA      . alum & mag hydroxide-simeth (MAALOX/MYLANTA) 200-200-20 MG/5ML suspension 30 mL  30 mL Oral Q4H PRN Kerry Hough, PA      . chlordiazePOXIDE (LIBRIUM) capsule 25 mg  25 mg Oral QID PRN Kerry Hough, PA      . cloNIDine (CATAPRES) tablet 0.1 mg  0.1 mg Oral BH-qamhs Kerry Hough, PA   0.1 mg at 01/10/13 2117   Followed by  . cloNIDine (CATAPRES) tablet 0.1 mg  0.1 mg Oral QAC breakfast Kerry Hough, PA      . dicyclomine (BENTYL) tablet 20 mg  20 mg Oral Q6H PRN Kerry Hough, PA      . feeding supplement (ENSURE COMPLETE) liquid 237 mL  237 mL Oral BID BM Madie Reno  A Lugo, MD   237 mL at 01/11/13 1000  . loperamide (IMODIUM) capsule 2-4 mg  2-4 mg Oral PRN Kerry Hough, PA      . magnesium hydroxide (MILK OF MAGNESIA) suspension 30 mL  30 mL Oral Daily PRN Kerry Hough, PA      . methocarbamol (ROBAXIN) tablet 500 mg  500 mg Oral Q8H PRN Kerry Hough, PA      . multivitamin with minerals tablet 1 tablet  1 tablet Oral Daily Kerry Hough, PA   1 tablet at 01/11/13 0841  . naproxen (NAPROSYN) tablet 500 mg  500 mg Oral BID PRN Kerry Hough, PA      . nicotine (NICODERM CQ - dosed in mg/24 hours) patch 21 mg  21 mg Transdermal Q0600 Kerry Hough, PA      . ondansetron (ZOFRAN-ODT) disintegrating  tablet 4 mg  4 mg Oral Q6H PRN Kerry Hough, PA      . QUEtiapine (SEROQUEL) tablet 400 mg  400 mg Oral QHS Kerry Hough, PA   400 mg at 01/10/13 2106  . QUEtiapine (SEROQUEL) tablet 50 mg  50 mg Oral BID Rachael Fee, MD   50 mg at 01/11/13 7829    Lab Results: No results found for this or any previous visit (from the past 48 hour(s)).  Physical Findings: AIMS: Facial and Oral Movements Muscles of Facial Expression: None, normal Lips and Perioral Area: None, normal Jaw: None, normal Tongue: None, normal,Extremity Movements Upper (arms, wrists, hands, fingers): None, normal Lower (legs, knees, ankles, toes): None, normal, Trunk Movements Neck, shoulders, hips: None, normal, Overall Severity Severity of abnormal movements (highest score from questions above): None, normal Incapacitation due to abnormal movements: None, normal Patient's awareness of abnormal movements (rate only patient's report): No Awareness, Dental Status Current problems with teeth and/or dentures?: Yes (missing teeth) Does patient usually wear dentures?: No  CIWA:  CIWA-Ar Total: 2  COWS:     Treatment Plan Summary: Daily contact with patient to assess and evaluate symptoms and progress in treatment Medication management  Plan: Supportive approach/coping skills/relapse prevention. Supportive approach/coping skills. Encouraged out of room, participation in group sessions and application of coping skills. Will continue to monitor response to/adverse effects of medications in use to assure effectiveness. Continue to monitor mood and behavior. Continue current plan of care.    Medical Decision Making Problem Points:  Established problem, stable/improving (1), Review of last therapy session (1) and Review of psycho-social stressors (1) Data Points:  Review or order clinical lab tests (1) Review of medication regiment & side effects (2) Review of new medications or change in dosage (2)  I certify that  inpatient services furnished can reasonably be expected to improve the patient's condition.   Jorian Willhoite,MICKIE D.  RPA-C CAQ-Psych 01/11/2013, 4:00 PM
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
## 2013-01-11 NOTE — Progress Notes (Signed)
1315 [facility] Psych educational Group co hosted by Latoya L. MHT consisted of positive coping skills that pts can use during difficult times. Pts were also encouraged to try changing there lifestyle in a positive direction to prevent themselves from falling back into the back bad habits. Pt remain active and alert in group and remain appropriate during group. Pt feels that a lot was learned from this group. 

## 2013-01-11 NOTE — Progress Notes (Signed)
Patient ID: [name], female   DOB: 07/02/1961, 52 y.o.   MRN: [id number] D: Pt is awake and active on the unit this AM. Pt denies SI/HI and A/V hallucinations. Pt is participating in the milieu and is cooperative with staff. Pt did not complete self inventory. Pt mood is depressed and her affect is flat. Pt forwards little to staff and is somewhat isolative. Writer encouraged pt to learn as much as possible during her stay and to apply this to her life after discharge.   A: Writer utilized therapeutic communication, encouraged pt to discuss feelings with staff and administered medication per MD orders. Writer also encouraged pt to attend groups.  R: Pt is attending groups and tolerating medications well.  15 minute checks are ongoing for safety. Pt is receptive to staff input. Writer will continue to monitor.

## 2013-01-11 NOTE — Progress Notes (Signed)
Patient attended group this evening, has been up briefly watching tv but not much interaction with peers. Patient reports that she is feeling a little better and is attempting to attend more groups. Patient reports that her seroquel is helping and she does not feel like she is experiencing any withdrawal symptoms. Patient offered support and encouragement, safety maintained on unit. Patient currently denies si/hi/a/v hallucinations. Will continue to monitor.

## 2013-01-12 NOTE — Progress Notes (Signed)
Adult Psychoeducational Group Note  Date:  01/12/2013 Time:  11:07 PM  Group Topic/Focus:  Wrap-Up Group:   The focus of this group is to help patients review their daily goal of treatment and discuss progress on daily workbooks.  Participation Level:  Active  Participation Quality:  Attentive  Affect:  Appropriate  Cognitive:  Appropriate  Insight: Good  Engagement in Group:  Lacking  Modes of Intervention:  Confrontation and Discussion  Additional Comments:  Patient shared that she had a positive day and that is all she wanted to share.  Ceira Hoeschen, Newton Pigg 01/12/2013, 11:07 PM

## 2013-01-12 NOTE — Progress Notes (Signed)
Patient ID: [name], female   DOB: [birth year], 52 y.o.   MRN: [id number] D: Pt is asleep in bed this AM. Pt denies SI/HI and A/V hallucinations. Pt is not participating in the milieu but is cooperative with staff. Pt did not complete self inventory or attend morning group. Pt mood is depressed/isolative and her affect is sad/flat. Pt spent the morning in bed.   A: Writer utilized therapeutic communication, encouraged pt to discuss feelings with staff and administered medication per MD orders. Writer also encouraged pt to attend groups.   R: Pt is attending groups and tolerating medications well. Writer will continue to monitor. 15 minute checks are ongoing for safety.

## 2013-01-12 NOTE — Clinical Social Work Psychosocial (Signed)
[facility] Group Notes:  (Clinical Social Work)  01/12/2013   3:00-4:00PM  Summary of Progress/Problems:   Summary of Progress/Problems:   The main focus of today's process group was for the patient to define "support" and describe what healthy supports are.  An emphasis was placed on using therapist, doctor, therapy groups, self-help groups and problem-specific support groups to expand supports.  In the last part of group, we listened to a variety of music to determine emotional reactions and assist patients in identifying ways to incorporate music into their wellness plans.   The patient expressed very little during group, but was attentive.  She was noted to be singing to a number of the songs played.  Type of Therapy:  Process Group  Participation Level:  Minimal  Participation Quality:  Attentive  Affect:  Blunted  Cognitive:  Oriented  Insight:  Developing/Improving  Engagement in Therapy:  Developing/Improving  Modes of Intervention:  Clarification, Education, Limit-setting, Problem-solving, Socialization, Support and Processing, Exploration, Discussion, Role-Play   Ambrose Mantle, LCSW 01/12/2013, 4:31 PM

## 2013-01-12 NOTE — Progress Notes (Signed)
[facility] MD Progress Note  01/12/2013 2:46 PM [name]  MRN:  [id number]  Subjective:   Says she didn't sleep as well last night. Talking about taking a vacation. NOt committed to staying clean & sober.  Diagnosis:   Axis I: Cocaine abuse, Polysubstance absue Axis II: Deferred Axis III:  Past Medical History  Diagnosis Date  . Depression   . Bipolar 1 disorder   . Drug abuse    Axis IV: economic problems, housing problems, occupational problems and other psychosocial or environmental problems Axis V: 41-50 serious symptoms  ADL's:  Fair  Sleep: Good  Appetite:  Good  Suicidal Ideation: "No" Plan:  No Intent:  No Means:  No Homicidal Ideation: "No" Plan:  No Intent:  No Means:  No  AEB (as evidenced by): Per patient's reports.  Psychiatric Specialty Exam: Review of Systems  Constitutional: Negative.   HENT: Negative.   Eyes: Negative.   Respiratory: Negative.   Cardiovascular: Negative.   Gastrointestinal: Negative.   Genitourinary: Negative.   Musculoskeletal: Negative.   Skin: Negative.   Neurological: Negative.   Endo/Heme/Allergies: Negative.   Psychiatric/Behavioral: Positive for depression (Currently being stabilized with medication.) and substance abuse (Hx cocaine abuse). Negative for suicidal ideas, hallucinations and memory loss. The patient is not nervous/anxious and does not have insomnia.     Blood pressure 124/76, pulse 76, temperature 98.7 F (37.1 C), temperature source Oral, resp. rate 18, height 4\' 11"  (1.499 m), weight 54.885 kg (121 lb).Body mass index is 24.44 kg/(m^2).  General Appearance: Disheveled  Eye Solicitor::  Fair  Speech:  Clear and Coherent  Volume:  Normal  Mood:  "My mood is not so bad today, but I have my moments".  Affect:  Flat, blunt.  Thought Process:  Coherent and Intact  Orientation:  Full (Time, Place, and Person)  Thought Content:  Rumination and denies hallucinations.  Suicidal Thoughts:  No  Homicidal  Thoughts:  No  Memory:  Immediate;   Good Recent;   Good Remote;   Good  Judgement:  Fair  Insight:  Good  Psychomotor Activity:  Normal  Concentration:  Good  Recall:  Good  Akathisia:  No  Handed:  Right  AIMS (if indicated):     Assets:  Communication Skills Desire for Improvement  Sleep:  Number of Hours: 6.25    Current Medications: Current Facility-Administered Medications  Medication Dose Route Frequency Provider Last Rate Last Dose  . acetaminophen (TYLENOL) tablet 650 mg  650 mg Oral Q6H PRN Kerry Hough, PA      . alum & mag hydroxide-simeth (MAALOX/MYLANTA) 200-200-20 MG/5ML suspension 30 mL  30 mL Oral Q4H PRN Kerry Hough, PA      . chlordiazePOXIDE (LIBRIUM) capsule 25 mg  25 mg Oral QID PRN Kerry Hough, PA      . cloNIDine (CATAPRES) tablet 0.1 mg  0.1 mg Oral BH-qamhs Kerry Hough, PA   0.1 mg at 01/10/13 2117   Followed by  . cloNIDine (CATAPRES) tablet 0.1 mg  0.1 mg Oral QAC breakfast Kerry Hough, PA      . dicyclomine (BENTYL) tablet 20 mg  20 mg Oral Q6H PRN Kerry Hough, PA      . feeding supplement (ENSURE COMPLETE) liquid 237 mL  237 mL Oral BID BM Rachael Fee, MD   237 mL at 01/11/13 1400  . loperamide (IMODIUM) capsule 2-4 mg  2-4 mg Oral PRN Kerry Hough, PA      .  magnesium hydroxide (MILK OF MAGNESIA) suspension 30 mL  30 mL Oral Daily PRN Kerry Hough, PA      . methocarbamol (ROBAXIN) tablet 500 mg  500 mg Oral Q8H PRN Kerry Hough, PA      . multivitamin with minerals tablet 1 tablet  1 tablet Oral Daily Kerry Hough, PA   1 tablet at 01/12/13 9562  . naproxen (NAPROSYN) tablet 500 mg  500 mg Oral BID PRN Kerry Hough, PA      . nicotine (NICODERM CQ - dosed in mg/24 hours) patch 21 mg  21 mg Transdermal Q0600 Kerry Hough, PA      . ondansetron (ZOFRAN-ODT) disintegrating tablet 4 mg  4 mg Oral Q6H PRN Kerry Hough, PA      . QUEtiapine (SEROQUEL) tablet 400 mg  400 mg Oral QHS Kerry Hough, PA   400 mg  at 01/11/13 2110  . QUEtiapine (SEROQUEL) tablet 50 mg  50 mg Oral BID Rachael Fee, MD   50 mg at 01/12/13 1308    Lab Results: No results found for this or any previous visit (from the past 48 hour(s)).  Physical Findings: AIMS: Facial and Oral Movements Muscles of Facial Expression: None, normal Lips and Perioral Area: None, normal Jaw: None, normal Tongue: None, normal,Extremity Movements Upper (arms, wrists, hands, fingers): None, normal Lower (legs, knees, ankles, toes): None, normal, Trunk Movements Neck, shoulders, hips: None, normal, Overall Severity Severity of abnormal movements (highest score from questions above): None, normal Incapacitation due to abnormal movements: None, normal Patient's awareness of abnormal movements (rate only patient's report): No Awareness, Dental Status Current problems with teeth and/or dentures?: Yes (missing teeth) Does patient usually wear dentures?: No  CIWA:  CIWA-Ar Total: 2  COWS:     Treatment Plan Summary: Daily contact with patient to assess and evaluate symptoms and progress in treatment Medication management  Plan: Supportive approach/coping skills/relapse prevention. Supportive approach/coping skills. Encouraged out of room, participation in group sessions and application of coping skills. Will continue to monitor response to/adverse effects of medications in use to assure effectiveness. Continue to monitor mood and behavior. Continue current plan of care.    Medical Decision Making Problem Points:  Established problem, stable/improving (1), Review of last therapy session (1) and Review of psycho-social stressors (1) Data Points:  Review or order clinical lab tests (1) Review of medication regiment & side effects (2) Review of new medications or change in dosage (2)  I certify that inpatient services furnished can reasonably be expected to improve the patient's condition.   Khylin Gutridge,MICKIE D.  RPA-C CAQ-Psych 01/12/2013,  2:46 PM

## 2013-01-12 NOTE — Progress Notes (Signed)
Adult Psychoeducational Group Note  Date:  01/12/2013 Time:  0915  Group Topic/Focus:  Self Inventory Review  Participation Level:  Did Not Attend pt refused to attend   [name] Shari Prows 01/12/2013, 10:32 AM
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
## 2013-01-13 NOTE — Clinical Social Work Note (Signed)
[facility] LCSW Group Therapy          Overcoming Obstacles       1:15 -2:30        01/13/2013   3:29 PM     Type of Therapy:  Group Therapy  Participation Level:  Appropriate  Participation Quality:  Appropriate  Affect:  Appropriate  Cognitive:  Attentive Appropriate  Insight:  Engaged  Engagement in Therapy:  Engaged  Modes of Intervention:  Discussion Exploration Problem-Solving Supportive  Summary of Progress/Problems:  Patient shared that her obstacle is drug addiction.  She states she can't get over it, around it or under it and no matter where she goes, she takes it with her.   She stated she has got to work on her self and stop living in the past because it has her stuck.  [name] 01/13/2013    3:29 PM

## 2013-01-13 NOTE — Tx Team (Signed)
Interdisciplinary Treatment Plan Update (Adult)  Date:  01/13/2013  Time Reviewed:  10:38 AM   Progress in Treatment: Attending groups:   Yes   Participating in groups:  Yes Taking medication as prescribed:  Yes Tolerating medication:  Yes Family/Significant othe contact made: Contact made with family Patient understands diagnosis:  Yes Discussing patient identified problems/goals with staff: Yes Medical problems stabilized or resolved: Yes Denies suicidal/homicidal ideation:Yes Issues/concerns per patient self-inventory:  Other:   New problem(s) identified:  Reason for Continuation of Hospitalization: Anxiety Depression Medication stabilization Suicidal ideation  Interventions implemented related to continuation of hospitalization:  Medication Management; safety checks q 15 mins  Additional comments:  Estimated length of stay:  2-3 days  Discharge Plan:  Home with outpatient follow up  New goal(s):  Review of initial/current patient goals per problem list:    1.  Goal(s): Eliminate SI/other thoughts of self harm   Met:  Yes  Target date: d/c  As evidenced by: Patient no longer endorses SI/HI or other thoughts of self harm.    2.  Goal (s):Reduce depression/anxiety (rated all symptoms at six today)  Met: Yes  Target date: d/c  As evidenced by: Patient will rate symptoms at four or below    3.  Goal(s):.stabilize on meds   Met:  No  Target date: d/c  As evidenced by: Patient will report being stabilized on medications - less symptomatic    4.  Goal(s): Refer for outpatient follow up   Met:  No  Target date: d/c  As evidenced by: Follow up appointment scheduled    Attendees: Patient:   01/13/2013 10:38 AM  Physican:  Patrick North, MD 01/13/2013 10:38 AM  Nursing:    Dellia Cloud, RN 01/13/2013 10:38 AM   Nursing:   Quintella Reichert, RN 01/13/2013 10:38 AM   Clinical Social Worker:  Juline Patch, LCSW 01/13/2013 10:38 AM   Other:  Tera Helper, PHM-NP 01/13/2013 10:38 AM   Other:         01/13/2013 10:38 AM Other:        01/13/2013 10:38 AM

## 2013-01-13 NOTE — Clinical Social Work Note (Signed)
[name] LCSW Aftercare Discharge Planning Group Note       8:30-9:30 AM  1/20/201410:40 AM  Participation Quality:  Appropriate  Affect:  Depressed  Cognitive:  Appropriate  Insight:  Limited  Engagement in Group:  Limited  Modes of Intervention:  Education, Exploration, Problem-solving and Support  Summary of Progress/Problems:  Patient reports doing doing better and denies SI/HI.  She rates all symptoms at six.  [name] 01/13/2013 10:40 AM

## 2013-01-13 NOTE — Progress Notes (Signed)
Writer spoke with patient 1:1 and she reported having had a good day. Patient reports that she feels better and is up more during the day now. Patient reports that she needs to change her surroundings because where she hangs out and the friends that she has are users. Patient reports that she is about to turn 52 and she needs to get her life together. Patient offered support and encouragement, she deneis si/hi/a/v hallucinations. Safety maintained on unit with 15 min checks, will continue to monitor.
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
## 2013-01-13 NOTE — Progress Notes (Signed)
Psychoeducational Group Note  Date:  01/13/2013 Time: 1100  Group Topic/Focus:  Self Care:   The focus of this group is to help patients understand the importance of self-care in order to improve or restore emotional, physical, spiritual, interpersonal, and financial health.  Participation Level: Did Not Attend  Participation Quality:  Not Applicable  Affect:  Not Applicable  Cognitive:  Not Applicable  Insight:  Not Applicable  Engagement in Group: Not Applicable  Additional Comments:  Patient did not attend group. Patient remained in bed.  [name] Brittini 01/13/2013, 6:35 PM

## 2013-01-14 DIAGNOSIS — F4310 Post-traumatic stress disorder, unspecified: Secondary | ICD-10-CM

## 2013-01-14 NOTE — Clinical Social Work Note (Signed)
[facility] LCSW Group Therapy  Feelings Around Group Therapy 1:15 - 2:30             01/14/2013   3:41 PM     Type of Therapy:  Group Therapy  Participation Level:  Appropriate  Participation Quality:  Appropriate  Affect:  Appropriate  Cognitive:  Attentive Appropriate  Insight:  Engaged  Engagement in Therapy:  Engaged  Modes of Intervention:  Discussion Exploration Problem-Solving Supportive  Summary of Progress/Problems:  Patient shares she accepts her illness, depression and Polysubstance abuse.  She states it has taken her a long time to learn she can not live without her medications.  She shared when she is off medications she abuses drugs, cannot function properly and this has become life threatening.   [name] 01/14/2013  3:41 PM

## 2013-01-14 NOTE — Tx Team (Signed)
Interdisciplinary Treatment Plan Update (Adult)  Date:  01/14/2013  Time Reviewed:  9:42 AM   Progress in Treatment: Attending groups:   Yes   Participating in groups:  Yes Taking medication as prescribed:  Yes Tolerating medication:  Yes Family/Significant othe contact made:  No family involvement Patient understands diagnosis:  Yes Discussing patient identified problems/goals with staff: Yes Medical problems stabilized or resolved: Yes Denies suicidal/homicidal ideation:Patient denies SI but endorses HI with no intent Issues/concerns per patient self-inventory:  Other:   New problem(s) identified:  Reason for Continuation of Hospitalization: Anxiety Depression Medication stabilization Homicidal Ideation  Interventions implemented related to continuation of hospitalization:  Medication Management; safety checks q 15 mins  Additional comments:  Estimated length of stay:  2-3 days  Discharge Plan:  Home with outpatient follow up  New goal(s):  Review of initial/current patient goals per problem list:    1.  Goal(s): Eliminate SI/HI other thoughts of self harm (patient not endorsing SI today but endorses HI)   Met:  No  Target date: d/c  As evidenced by: Patient will no longer endorse SI/HI or other thoughts of self harm.    2.  Goal (s):Reduce depression/anxiety (rated at six today)  Met:  No  Target date: d/c  As evidenced by: Patient will rate symptoms at four or below    3.  Goal(s):.stabilize on meds   Met:  No  Target date: d/c  As evidenced by: Patient will report being stabilized on medications - less symptomatic    4.  Goal(s): Refer for outpatient follow up   Met:  No  Target date: d/c  As evidenced by: Follow up appointment scheduled    Attendees: Patient:   [name] 01/14/2013 9:42 AM  Physican:  Geoffery Lyons, MD 01/14/2013 9:42 AM  Nursing:  Neill Loft, RN 01/14/2013 9:42 AM   Nursing:   Cammy Brochure, RN 01/14/2013  9:42 AM   Clinical Social Worker:  Juline Patch, LCSW 01/14/2013 9:42 AM   Other:   Serena Colonel, FNP 01/14/2013 9:42 AM   Other:         01/14/2013 9:42 AM Other:        01/14/2013 9:42 AM
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
## 2013-01-14 NOTE — Progress Notes (Signed)
Grief and Loss Group  The focus of the group tended toward loss of relationships and loss of life opportunities. Several members participated and supported others. A focus on the experience of grief feelings and helpful reactions to manage grief was discussed at end of group.   Pt was active in the group. She expressed regret that her struggle with addiction has caused her to not be the person she should have been and cost her opportunities to achieve some life goals. 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 Her goal now is to manage her addiction so that she can find the life she wants now.    Pt was very open and willing to be vulnerable as she talked about her feelings.   Dyke Maes

## 2013-01-14 NOTE — Progress Notes (Signed)
D:  Patient up and active in the milieu most of the day.  Has attended most groups and did also attend treatment team this morning.  Patient expressed a great deal of insight into her drug use and her problems.  She has been interacting with staff and denies suicidal ideation.  She did not fill out a self inventory form today.   A:  Patient encouraged to continue with her positive thinking and positive attitude.  Encouraged group attendance.  Patient also encouraged to start applying the many things she has learned during this admission and in treatment facilities in the past.  R:  Pleasant and cooperative.  She is interacting well with staff and peers.  She is tolerating her medications well and working toward a strong discharge plan.

## 2013-01-14 NOTE — Progress Notes (Signed)
Psychoeducational Group Note  Date:  01/14/2013 Time:  1100  Group Topic/Focus:  Recovery Goals:   The focus of this group is to identify appropriate goals for recovery and establish a plan to achieve them.  Participation Level: Did Not Attend  Participation Quality:  Not Applicable  Affect:  Not Applicable  Cognitive:  Not Applicable  Insight:  Not Applicable  Engagement in Group: Not Applicable  Additional Comments:  Pt did not attend group and remained lying in bed.   Sharyn Lull 01/14/2013, 2:55 PM

## 2013-01-14 NOTE — Progress Notes (Signed)
D: Patient in her room resting on first approach.  Patient states she had a great day.  Patient states he is tired after having a long day.  Patient states she had been up and about today.  Patient states she did attend some groups today.  Patient states she is still having mild passive SI and HI.  Verbally contracts for safety but patient states it is getting better.  Patient denies AVH. A: Staff to monitor Q 15 mins for safety.  Encouragement and support offered.  Scheduled medications administered per orders.   R: Patient remains safe on the unit.  Patient did not attend group tonight.  Patient taking administered tonight.
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
## 2013-01-15 NOTE — Progress Notes (Signed)
Patient ID: [name], female   DOB: February 04, 1961, 52 y.o.   MRN: [id number] Patient ID: [name], female   DOB: [birth year], 52 y.o.   MRN: [id number] [facility] MD Progress Note  01/15/2013 1:53 PM [name]  MRN:  [id number]  Subjective: "I'm doing pretty well.  Getting ready to be discharged tomorrow. I have some headaches, it started last night. I'm currently working on changing my way thinking and behavior. Try to think about consequences of my actions. I plan on doing better after discharge".  Diagnosis:  Bipolar Disorder, PTSD, Cocaine Dependence  ADL's:  Intact  Sleep: 6.75 hours per documentation.  Appetite:  Fair  Suicidal Ideation: "No" Plan:  denies Intent:  denies Means:  denies Homicidal Ideation: "No". Plan:  denies Intent:  denies Means:  denies  AEB (as evidenced by): per patient's reports.  Psychiatric Specialty Exam: Review of Systems  Constitutional: Negative.   HENT: Negative.   Eyes: Negative.   Respiratory: Negative.   Cardiovascular: Negative.   Gastrointestinal: Negative.   Genitourinary: Negative.   Musculoskeletal: Negative.   Skin: Negative.   Neurological: Negative.   Endo/Heme/Allergies: Negative.   Psychiatric/Behavioral: Positive for depression ( ) and substance abuse. The patient is nervous/anxious.     Blood pressure 109/63, pulse 107, temperature 98.2 F (36.8 C), temperature source Oral, resp. rate 16, height 4\' 11"  (1.499 m), weight 54.885 kg (121 lb).Body mass index is 24.44 kg/(m^2).  General Appearance: Fairly Groomed  Patent attorney::  Fair  Speech:  Clear and Coherent, Slow and not spontaneous  Volume:  Normal  Mood:  "I'm doing pretty well"  Affect: Euthymic.  Thought Process:  Coherent and Goal Directed  Orientation:  Full (Time, Place, and Person)  Thought Content:  Rumination.  Suicidal Thoughts:  No  Homicidal Thoughts:  Yes.  without intent/plan  Memory:  Immediate;   Fair Recent;   Fair Remote;   Fair   Judgement:  Fair  Insight:  Present  Psychomotor Activity:  Denies anxiety and or restlessness.  Concentration:  Fair  Recall:  Fair  Akathisia:  No  Handed:  Right  AIMS (if indicated):     Assets:  Desire for Improvement  Sleep:  Number of Hours: 5.5    Current Medications: Current Facility-Administered Medications  Medication Dose Route Frequency Provider Last Rate Last Dose  . acetaminophen (TYLENOL) tablet 650 mg  650 mg Oral Q6H PRN Kerry Hough, PA      . alum & mag hydroxide-simeth (MAALOX/MYLANTA) 200-200-20 MG/5ML suspension 30 mL  30 mL Oral Q4H PRN Kerry Hough, PA      . chlordiazePOXIDE (LIBRIUM) capsule 25 mg  25 mg Oral QID PRN Kerry Hough, PA      . feeding supplement (ENSURE COMPLETE) liquid 237 mL  237 mL Oral BID BM Rachael Fee, MD   237 mL at 01/14/13 1057  . magnesium hydroxide (MILK OF MAGNESIA) suspension 30 mL  30 mL Oral Daily PRN Kerry Hough, PA      . multivitamin with minerals tablet 1 tablet  1 tablet Oral Daily Kerry Hough, PA   1 tablet at 01/15/13 0865  . nicotine (NICODERM CQ - dosed in mg/24 hours) patch 21 mg  21 mg Transdermal Q0600 Kerry Hough, PA      . QUEtiapine (SEROQUEL) tablet 400 mg  400 mg Oral QHS Kerry Hough, PA   400 mg at 01/14/13 2148  . QUEtiapine (SEROQUEL) tablet 50 mg  50  mg Oral BID Rachael Fee, MD   50 mg at 01/15/13 1610    Lab Results: No results found for this or any previous visit (from the past 48 hour(s)).  Physical Findings: AIMS: Facial and Oral Movements Muscles of Facial Expression: None, normal Lips and Perioral Area: None, normal Jaw: None, normal Tongue: None, normal,Extremity Movements Upper (arms, wrists, hands, fingers): None, normal Lower (legs, knees, ankles, toes): None, normal, Trunk Movements Neck, shoulders, hips: None, normal, Overall Severity Severity of abnormal movements (highest score from questions above): None, normal Incapacitation due to abnormal movements:  None, normal Patient's awareness of abnormal movements (rate only patient's report): No Awareness, Dental Status Current problems with teeth and/or dentures?: Yes (missing teeth) Does patient usually wear dentures?: No  CIWA:  CIWA-Ar Total: 0  COWS:  COWS Total Score: 1   Treatment Plan Summary: Daily contact with patient to assess and evaluate symptoms and progress in treatment Medication management  Plan: Supportive approach/coping skills/relapse prevention Informed that she does have Acetaminophen 650 mg for headaches. No new changes made on the current treatment plan. Probable discharge on Thursday 01/16/13. Continue plan of care.          Medical Decision Making Problem Points:  Established problem, worsening (2) and Review of psycho-social stressors (1) Data Points:  Review of medication regiment & side effects (2)  I certify that inpatient services furnished can reasonably be expected to improve the patient's condition.   Armandina Stammer I 01/15/2013, 1:53 PM

## 2013-01-15 NOTE — Progress Notes (Signed)
  D) Patient pleasant and cooperative upon my assessment. Patient states "I am feeling much better now." Patient completed Patient Self Inventory, reports slept "well," and  appetite is "improving." Patient rates depression as   2/10, patient rates hopeless feelings as 2 /10. Patient denies SI/HI, denies A/V hallucinations.   A) Patient offered support and encouragement, patient encouraged to discuss feelings/concerns with staff. Patient verbalized understanding. Patient monitored Q15 minutes for safety. Patient met with MD  to discuss today's goals and plan of care.  R) Patient visible in milieu, attending groups in day room and meals in dining room. Patient appropriate with staff and peers.   Patient taking medications as ordered. Will continue to monitor.

## 2013-01-15 NOTE — Progress Notes (Signed)
[facility] INPATIENT:  Family/Significant Other Suicide Prevention Education  Suicide Prevention Education:  Patient Refusal for Family/Significant Other Suicide Prevention Education: The patient [name] has refused to provide written consent for family/significant other to be provided Family/Significant Other Suicide Prevention Education during admission and/or prior to discharge.  Physician notified.  Patient advised of no involvement with family. [name] 01/15/2013, 3:51 PM

## 2013-01-15 NOTE — Clinical Social Work Note (Signed)
[facility] LCSW Group Therapy  Emotional Regulation 1:15 - 2: 30 PM        01/15/2013  3:23 PM   Type of Therapy:  Group Therapy  Participation Level:  Appropriate  Participation Quality:  Appropriate  Affect:  Appropriate  Cognitive:  Attentive Appropriate  Insight:  Engaged  Engagement in Therapy:  Engaged  Modes of Intervention:  Discussion Exploration Problem-Solving Supportive  Summary of Progress/Problems:  Patient shared she used to act out negative on her emotions by fighting but has learned not to react to problems.  She states she now takes time to reason about what happened before allowing emotions to take over.  [name] 01/15/2013 3:23 PM

## 2013-01-15 NOTE — Progress Notes (Signed)
Adult Psychoeducational Group Note  Date:  01/15/2013 Time:  8:00PM  Group Topic/Focus:  Wrap-Up Group:   The focus of this group is to help patients review their daily goal of treatment and discuss progress on daily workbooks.  Participation Level:  Active  Participation Quality:  Appropriate  Affect:  Appropriate  Cognitive:  Alert and Oriented  Insight: Appropriate  Engagement in Group:  Developing/Improving  Modes of Intervention:  Clarification, Exploration, Problem-solving and Support  Additional Comments:  Pt rated her day as an 8. Pt reported that earlier she was experiencing a low day. Pt stated that she has been working on positive thinking. Pt stated that she learned that she cant go into the past and that she needs to work on the future. Pt stated that she hopes to be leaving tomorrow. Pt stated that she is able to go to a "safe place." Pt stated that she also would like to participate in vocational rehabilitation.  [name], Randal Buba 01/15/2013, 9:21 PM

## 2013-01-15 NOTE — Progress Notes (Signed)
D: Patient in the dayroom on approach.  Patient states he had a great day.  Patient states she gas been out and going to groups today.  Patient states she did have a period today where felt extremely depressed but states she is working towards getting better.  Patient denies SI/HI and denies AVH. A: Staff to monitor Q 15 mins for safety.  Encouragement and support offered.  Scheduled medications administered per orders. R: Patient remains safe on the unit.  Patient attended group tonight.  Patient taking administered medications.

## 2013-01-15 NOTE — Clinical Social Work Note (Signed)
[facility] LCSW Aftercare Discharge Planning Group Note       8:30-9:30 AM  1/22/201410:43 AM  Participation Quality:  Appropriate  Affect: Depressed, Flat  Cognitive:  Appropriate  Insight:  Engaged  Engagement in Group:  Engaged  Modes of Intervention:  Education, Exploration, Problem-solving and Support  Summary of Progress/Problems:  Patient currently denying SI/HI.  She rates symptoms all symptoms at eight.  Patient shared she is hopeful to discharge tomorrow.  [name] 01/15/2013 10:43 AM

## 2013-01-16 MED ORDER — QUETIAPINE FUMARATE 50 MG PO TABS: 50.0000 mg | ORAL_TABLET | Freq: Two times a day (BID) | ORAL | Status: DC

## 2013-01-16 MED ORDER — QUETIAPINE FUMARATE 400 MG PO TABS: 400.0000 mg | ORAL_TABLET | Freq: Every day | ORAL | Status: DC

## 2013-01-16 MED ORDER — ADULT MULTIVITAMIN W/MINERALS CH: 1.0000 | ORAL_TABLET | Freq: Every day | ORAL | Status: DC

## 2013-01-16 NOTE — [facility] Suicide Risk Assessment (Signed)
Suicide Risk Assessment  Discharge Assessment     Demographic Factors:  Divorced or widowed, Living alone and Unemployed  Mental Status Per Nursing Assessment::   On Admission:  Suicidal ideation indicated by patient;Self-harm thoughts  Current Mental Status by Physician: In full contact with reality. Mood is euthymic, affect is appropriate. She denies Suicidal ideas, plans or intent. She is committed to abstinence. She is going to be staying in the area with a friend until she is able to go to [place]  Loss Factors: NA  Historical Factors: NA  Risk Reduction Factors:   Positive social support  Continued Clinical Symptoms:  Bipolar Disorder:   Depressive phase Alcohol/Substance Abuse/Dependencies  Cognitive Features That Contribute To Risk: None identified   Suicide Risk:  Minimal: No identifiable suicidal ideation.  Patients presenting with no risk factors but with morbid ruminations; may be classified as minimal risk based on the severity of the depressive symptoms  Discharge Diagnoses:   AXIS I:  Bipolar Disorder, Cocaine Dependence, PTSD AXIS II:  Deferred AXIS III:   Past Medical History  Diagnosis Date  . Depression   . Bipolar 1 disorder   . Drug abuse    AXIS IV:  housing problems and problems with primary support group AXIS V:  61-70 mild symptoms  Plan Of Care/Follow-up recommendations:  Activity:  As tolerated Diet:  Regular Will follow up outpatient basis Is patient on multiple antipsychotic therapies at discharge:  No   Has Patient had three or more failed trials of antipsychotic monotherapy by history:  No  Recommended Plan for Multiple Antipsychotic Therapies: N/A   Jammie Clink A 01/16/2013, 12:24 PM

## 2013-01-16 NOTE — Discharge Summary (Signed)
Agree with assessment and plan Sarafina Puthoff A. Verdia Bolt, M.D. 

## 2013-01-16 NOTE — Progress Notes (Signed)
Adult Psychoeducational Group Note  Date:  01/16/2013 Time:  11:29 AM  Group Topic/Focus:  Therapeutic activity  Participation Level:  Did Not Attend  Participation Quality:    Affect:    Cognitive:    Insight:   Engagement in Group:    Modes of Intervention:    Additional Comments:  none  Marquis Lunch, Gottlieb Zuercher 01/16/2013, 11:29 AM

## 2013-01-16 NOTE — Progress Notes (Signed)
Patient denies SI/HI, denies A/V hallucinations. Patient verbalizes understanding of discharge instructions, follow up care and prescriptions. Patient given all belongings from [facility] locker. Patient escorted out by staff, transported by public transportation.  

## 2013-01-16 NOTE — Progress Notes (Signed)
Psychoeducational Group Note  Date:  01/16/2013 Time:  1100  Group Topic/Focus:  Overcoming Stress:   The focus of this group is to define stress and help patients assess their triggers.  Participation Level: Did Not Attend  Participation Quality:  Not Applicable  Affect:  Not Applicable  Cognitive:  Not Applicable  Insight:  Not Applicable  Engagement in Group: Not Applicable  Additional Comments:  Patient did not attend, patient was encouraged by staff.   Noah Charon 01/16/2013, 11:45 AM

## 2013-01-16 NOTE — Progress Notes (Signed)
[facility] LCSW Aftercare Discharge Planning Group Note  01/16/2013 10:43 AM  Participation Quality:  Appropriate and Attentive  Affect:  Flat  Cognitive:  Alert and Appropriate  Insight:  Developing/Improving  Engagement in Group:  Engaged  Modes of Intervention:  Clarification, Discussion, Education, Exploration, Problem-solving, Rapport Building, Socialization and Support    Summary of Progress/Problems:Pt attended discharge planning group and actively participated in group. CSW provided pt with today's workbook. Pt presents with flat affect. Pt rates depression and anxiety at a 5 today. Pt does not endorses SI or HI. Pt states her plans for discharge are to go to [facility] and to stay on her medications.  Axelle Szwed L 01/16/2013, 10:43 AM

## 2013-01-16 NOTE — Discharge Summary (Signed)
Physician Discharge Summary Note  Patient:  [name] is an 52 y.o., female MRN:  [id number] DOB:  1961-04-02 Patient phone:  [phone] (home)  Patient address:   [address] [place] [address],   Date of Admission:  01/07/2013  Date of Discharge: 01/16/13  Reason for Admission:  Auditory hallucinations and suicidal ideations.  Discharge Diagnoses: Principal Problem:  *Cocaine dependence Active Problems:  Bipolar disorder  Review of Systems  Constitutional: Negative.   HENT: Negative.   Eyes: Negative.   Respiratory: Negative.   Cardiovascular: Negative.   Gastrointestinal: Negative.   Genitourinary: Negative.   Musculoskeletal: Negative.   Skin: Negative.   Psychiatric/Behavioral: Positive for substance abuse (Hx of). Negative for depression, suicidal ideas, hallucinations and memory loss. The patient is not nervous/anxious and does not have insomnia.    Axis Diagnosis:   AXIS I:  Cocaine dependence, Bipolar affective disorder, AXIS II:  Deferred AXIS III:   Past Medical History  Diagnosis Date  . Depression   . Bipolar 1 disorder   . Drug abuse    AXIS IV:  other psychosocial or environmental problems AXIS V:  63  Level of Care:  OP  Hospital Course: Left here in September, staid clean for a while, boyfriend went to jail, was locked up for old warrants. She relapsed on crack. Using two times every week. Staying with daughter's friend. Daughter left. She does not have family in [place] left. Wants to get "clean" and go to [place]. Endorses that she is not hearing voices as she is taking her medications. Got to a point she was wanting to kill herself.    Upon admission into this hospital and after admission assessment/evaluation, Ms. Mohs was started on clonidine protocol for her opiate detoxification and a brief administration of Librium protocol to stabilize her condition as she tested positive for opiate, Benzodiazepine and Marijuana on  admission. She also was ordered and received Seroquel 50 mg bid for anxiety/mood control and Seroquel 400 mg Q bedtime for mood control.  Ms. Aker was also enrolled in group counseling sessions and activities to learn coping skills that should help after discharge to cope better, manage her mental health substance abuse issues for stability and a much longer sobriety. She also attended AA/NA meetings being offered and held on this unit to help her into her life and what role substance addiction is doing to her life. Ms. Bornhorst does not have any other previous and or identifiable medical conditions that required treatment and or monitoring. However, she was monitored closely for any potential problems that may arise as a result of and or during detoxification treatment. Patient tolerated her detoxification treatment without any significant adverse effects and or reactions reported.  Patient attended treatment team meeting this am and met with the treatment team. Her symptoms, substance abuse issues, response to to treatment and discharge plans discussed. Patient endorsed that she is doing well and stable for discharge to pursue the next phase of her mental health/substance abuse treatment. Ms.Smithers added that her plan after discharge is to take her medications and get herself together to either move to [place] to be near her daughter and or to [place] where her other family members live. She also added that it is about time that she follows her own advice and stay away from drugs and or drug infested areas. She stated that drugs are every where and that it is up to her to make that change for her self".  It was  agreed upon between patient and the team that she will continue psychiatric care on outpatient basis at [facility] with Dr. August Saucer on 01/20/13 @ 08:00 am. The address, date and time for this appointment provided for patient. Upon discharge, patient adamantly denies suicidal, homicidal ideations,  auditory, visual hallucinations, delusional thinking and or withdrawal symptoms. Patient left [facility] with all personal belongings in no apparent distress.    Consults:  None  Significant Diagnostic Studies:  labs: CBC with diff, CMP, UDS, Toxicology tests.  Discharge Vitals:   Blood pressure 122/55, pulse 93, temperature 97.8 F (36.6 C), temperature source Oral, resp. rate 16, height 4\' 11"  (1.499 m), weight 54.885 kg (121 lb). Body mass index is 24.44 kg/(m^2). Lab Results:   No results found for this or any previous visit (from the past 72 hour(s)).  Physical Findings: AIMS: Facial and Oral Movements Muscles of Facial Expression: None, normal Lips and Perioral Area: None, normal Jaw: None, normal Tongue: None, normal,Extremity Movements Upper (arms, wrists, hands, fingers): None, normal Lower (legs, knees, ankles, toes): None, normal, Trunk Movements Neck, shoulders, hips: None, normal, Overall Severity Severity of abnormal movements (highest score from questions above): None, normal Incapacitation due to abnormal movements: None, normal Patient's awareness of abnormal movements (rate only patient's report): No Awareness, Dental Status Current problems with teeth and/or dentures?: Yes (missing teeth) Does patient usually wear dentures?: No  CIWA:  CIWA-Ar Total: 0  COWS:  COWS Total Score: 1   Psychiatric Specialty Exam: See Psychiatric Specialty Exam and Suicide Risk Assessment completed by Attending Physician prior to discharge.  Discharge destination:  Home  Is patient on multiple antipsychotic therapies at discharge:  No   Has Patient had three or more failed trials of antipsychotic monotherapy by history:  No  Recommended Plan for Multiple Antipsychotic Therapies: NA     Medication List     As of 01/16/2013 12:02 PM    TAKE these medications      Indication    multivitamin with minerals Tabs   Take 1 tablet by mouth daily. For nutritional supplement.     Indication: Vitamin supplement.      QUEtiapine 400 MG tablet   Commonly known as: SEROQUEL   Take 1 tablet (400 mg total) by mouth at bedtime. For mood control    Indication: Depressive Phase of Manic-Depression, Manic Phase of Manic-Depression      QUEtiapine 50 MG tablet   Commonly known as: SEROQUEL   Take 1 tablet (50 mg total) by mouth 2 (two) times daily. For mood control/anxiety    Indication: Depressive Phase of Manic-Depression, Manic Phase of Manic-Depression        Follow-up Information    Follow up with Dr. Nolberto Hanlon. On 01/22/2013. (You are scheuled with Dr. Mallie Mussel on Wednesday, January 22, 2013 at 8 AM)    Contact information:   [address] [place], [place]  [address]  [phone]         Follow-up recommendations:  Activity:  as tolerated Other:  Keep all scheduled follow-up appointments as recommended.    Comments: Take all your medications as prescribed by your mental healthcare provider. Report any adverse effects and or reactions from your medicines to your outpatient provider promptly. Patient is instructed and cautioned to not engage in alcohol and or illegal drug use while on prescription medicines. In the event of worsening symptoms, patient is instructed to call the crisis hotline, 911 and or go to the nearest ED for appropriate evaluation and treatment  of symptoms. Follow-up with your primary care provider for your other medical issues, concerns and or health care needs.       Total Discharge Time:  Greater than 30 minutes.  SignedArmandina Stammer I 01/16/2013, 12:02 PM

## 2013-01-17 NOTE — Progress Notes (Signed)
[facility] Adult Case Management Discharge Plan :  "Late Entry for 01/16/13  Will you be returning to the same living situation after discharge: Yes,  Patient returning the the home she shares with a friend At discharge, do you have transportation home?:No.  Patient assisted with a bus pass. Do you have the ability to pay for your medications:Yes,  Patient able to make co-payment  Release of information consent forms completed and in the chart;  Patient's signature needed at discharge.  Patient to Follow up at: Follow-up Information    Follow up with Dr. Nolberto Hanlon. On 01/22/2013. (You are scheuled with Dr. Mallie Mussel on Wednesday, January 22, 2013 at 8 AM)    Contact information:   [address] [place], [place]  [address]  [phone]         Patient denies SI/HI:   Yes,  Patient was not endorsing SI/HI or thoughts of self harm    Safety Planning and Suicide Prevention discussed:  Yes,  Reviewed during aftercare groups.  [name] 01/17/2013, 11:35 AM

## 2013-01-17 NOTE — Progress Notes (Signed)
Tanyiah Laurich Nail,LCSW, MSW Clinical Lead [phone] 

## 2013-01-20 NOTE — Progress Notes (Signed)
Patient Discharge Instructions:  After Visit Summary (AVS): Faxed to: 01/20/13  Discharge Summary Note: Faxed to: 01/20/13  Psychiatric Admission Assessment Note: Faxed to: 01/20/13  Suicide Risk Assessment - Discharge Assessment: Faxed to: 01/20/13  Faxed/Sent to the Next Level Care provider: 01/20/13 Faxed to [facility] @ [phone]  Jerelene Redden, 01/20/2013, 3:06 PM

## 2013-03-01 ENCOUNTER — Encounter (HOSPITAL_COMMUNITY): Payer: Self-pay | Admitting: Licensed Clinical Social Worker

## 2013-03-01 ENCOUNTER — Other Ambulatory Visit: Payer: Self-pay

## 2013-03-01 ENCOUNTER — Emergency Department (HOSPITAL_COMMUNITY)
Admission: EM | Admit: 2013-03-01 | Discharge: 2013-03-02 | Disposition: A | Discharge status: Other Acute Inpatient Hospital | Payer: Medicare Other | Attending: Emergency Medicine | Admitting: Emergency Medicine

## 2013-03-01 ENCOUNTER — Ambulatory Visit (HOSPITAL_COMMUNITY)
Admission: AD | Admit: 2013-03-01 | Discharge: 2013-03-01 | Disposition: A | Discharge status: Home / Self Care | Payer: No Typology Code available for payment source | Source: Home / Self Care | Attending: Psychiatry | Admitting: Psychiatry

## 2013-03-01 ENCOUNTER — Encounter (HOSPITAL_COMMUNITY): Payer: Self-pay | Admitting: Emergency Medicine

## 2013-03-01 DIAGNOSIS — H5316 Psychophysical visual disturbances: Secondary | ICD-10-CM | POA: Insufficient documentation

## 2013-03-01 DIAGNOSIS — F3130 Bipolar disorder, current episode depressed, mild or moderate severity, unspecified: Secondary | ICD-10-CM | POA: Insufficient documentation

## 2013-03-01 DIAGNOSIS — R45851 Suicidal ideations: Secondary | ICD-10-CM | POA: Insufficient documentation

## 2013-03-01 DIAGNOSIS — R443 Hallucinations, unspecified: Secondary | ICD-10-CM | POA: Insufficient documentation

## 2013-03-01 DIAGNOSIS — F1410 Cocaine abuse, uncomplicated: Secondary | ICD-10-CM | POA: Insufficient documentation

## 2013-03-01 DIAGNOSIS — M79609 Pain in unspecified limb: Secondary | ICD-10-CM | POA: Insufficient documentation

## 2013-03-01 DIAGNOSIS — Z3202 Encounter for pregnancy test, result negative: Secondary | ICD-10-CM | POA: Insufficient documentation

## 2013-03-01 DIAGNOSIS — F17200 Nicotine dependence, unspecified, uncomplicated: Secondary | ICD-10-CM | POA: Insufficient documentation

## 2013-03-01 DIAGNOSIS — Z91199 Patient's noncompliance with other medical treatment and regimen due to unspecified reason: Secondary | ICD-10-CM | POA: Insufficient documentation

## 2013-03-01 DIAGNOSIS — Z9119 Patient's noncompliance with other medical treatment and regimen: Secondary | ICD-10-CM | POA: Insufficient documentation

## 2013-03-01 LAB — CBC
HCT: 42.6 % (ref 36.0–46.0)
Hemoglobin: 14.2 g/dL (ref 12.0–15.0)
MCV: 86.8 fL (ref 78.0–100.0)
RBC: 4.91 MIL/uL (ref 3.87–5.11)
RDW: 14.4 % (ref 11.5–15.5)
WBC: 6.5 10*3/uL (ref 4.0–10.5)

## 2013-03-01 LAB — COMPREHENSIVE METABOLIC PANEL
Albumin: 3.9 g/dL (ref 3.5–5.2)
Alkaline Phosphatase: 79 U/L (ref 39–117)
BUN: 14 mg/dL (ref 6–23)
CO2: 28 mEq/L (ref 19–32)
Chloride: 107 mEq/L (ref 96–112)
Creatinine, Ser: 0.94 mg/dL (ref 0.50–1.10)
GFR calc Af Amer: 81 mL/min — ABNORMAL LOW (ref >90)
GFR calc non Af Amer: 70 mL/min — ABNORMAL LOW (ref >90)
Glucose, Bld: 104 mg/dL — ABNORMAL HIGH (ref 70–99)
Potassium: 4 mEq/L (ref 3.5–5.1)
Total Bilirubin: 0.3 mg/dL (ref 0.3–1.2)

## 2013-03-01 LAB — RAPID URINE DRUG SCREEN, HOSP PERFORMED
Benzodiazepines: NOT DETECTED
Cocaine: POSITIVE — AB
Opiates: NOT DETECTED
Tetrahydrocannabinol: NOT DETECTED

## 2013-03-01 MED ORDER — QUETIAPINE FUMARATE 300 MG PO TABS
400.0000 mg | ORAL_TABLET | Freq: Every day | ORAL | Status: DC
  Administered 2013-03-01: 400 mg via ORAL
  Filled 2013-03-01: qty 1

## 2013-03-01 MED ORDER — IBUPROFEN 600 MG PO TABS: 600.0000 mg | ORAL_TABLET | Freq: Three times a day (TID) | ORAL | Status: DC | PRN

## 2013-03-01 MED ORDER — QUETIAPINE FUMARATE 50 MG PO TABS
50.0000 mg | ORAL_TABLET | Freq: Two times a day (BID) | ORAL | Status: DC
  Filled 2013-03-01: qty 2

## 2013-03-01 MED ORDER — QUETIAPINE FUMARATE 50 MG PO TABS: 50.0000 mg | ORAL_TABLET | Freq: Two times a day (BID) | ORAL | Status: DC

## 2013-03-01 MED ORDER — ACETAMINOPHEN 325 MG PO TABS: 650.0000 mg | ORAL_TABLET | ORAL | Status: DC | PRN

## 2013-03-01 MED ORDER — ADULT MULTIVITAMIN W/MINERALS CH
1.0000 | ORAL_TABLET | Freq: Every day | ORAL | Status: DC
  Administered 2013-03-01: 1 via ORAL
  Filled 2013-03-01: qty 1

## 2013-03-01 MED ORDER — LORAZEPAM 1 MG PO TABS: 1.0000 mg | ORAL_TABLET | Freq: Three times a day (TID) | ORAL | Status: DC | PRN
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
## 2013-03-01 NOTE — ED Notes (Signed)
Pt laying in bed asleep. Pt easily aroused.  Pt denies s/hi at this time.  Will continue to monitor.

## 2013-03-01 NOTE — ED Provider Notes (Signed)
History    This chart was scribed for non-physician practitioner working with Gwyneth Sprout, MD by Frederik Pear, ED Scribe. This patient was seen in room WLCON/[facility]CON and the patient's care was started at 1720.   CSN: [id number]  Arrival date & time 03/01/13  1453   First MD Initiated Contact with Patient 03/01/13 1720      Chief Complaint  Patient presents with  . Medical Clearance    (Consider location/radiation/quality/duration/timing/severity/associated sxs/prior treatment) The history is provided by the patient. No language interpreter was used.   [name] is a 52 y.o. female with a h/o of drug abuse, depression, and bipolar 1 disorder who presents to the Emergency Department with a chief complaint of medical clearance for gradually increasing SI and depression that began about a week ago. She denies having a current plan or HI. She also complains of visual and auditory hallucinations. She reports that she has been off her Seroquel for the past week. She also reports that she has been using crack, and her last time using was yesterday. In ED, she also complains of left foot shooting pain that begins at a callous on her foot.  Past Medical History  Diagnosis Date  . Depression   . Bipolar 1 disorder   . Drug abuse     Past Surgical History  Procedure Laterality Date  . No past surgeries      Family History  Problem Relation Age of Onset  . Anesthesia problems Neg Hx   . Hypotension Neg Hx   . Malignant hyperthermia Neg Hx   . Pseudochol deficiency Neg Hx     History  Substance Use Topics  . Smoking status: Current Every Day Smoker -- 0.50 packs/day for 10 years    Types: Cigarettes  . Smokeless tobacco: Never Used  . Alcohol Use: 15.0 oz/week    25 Cans of beer per week     Comment: Weekly     OB History   Grav Para Term Preterm Abortions TAB SAB Ect Mult Living                  Review of Systems  Allergies  Review of patient's allergies  indicates no known allergies.  Home Medications   Current Outpatient Rx  Name  Route  Sig  Dispense  Refill  . Multiple Vitamin (MULTIVITAMIN WITH MINERALS) TABS   Oral   Take 1 tablet by mouth daily. For nutritional supplement.         . QUEtiapine (SEROQUEL) 400 MG tablet   Oral   Take 1 tablet (400 mg total) by mouth at bedtime. For mood control   30 tablet   0   . QUEtiapine (SEROQUEL) 50 MG tablet   Oral   Take 1 tablet (50 mg total) by mouth 2 (two) times daily. For mood control/anxiety   60 tablet   0     BP 137/54  Pulse 71  Temp(Src) 98.8 F (37.1 C) (Oral)  Resp 18  SpO2 93%  Physical Exam  Nursing note and vitals reviewed. Constitutional: She is oriented to person, place, and time. She appears well-developed and well-nourished. No distress.  HENT:  Head: Normocephalic and atraumatic.  Eyes: EOM are normal. Pupils are equal, round, and reactive to light.  Neck: Normal range of motion. Neck supple. No tracheal deviation present.  Cardiovascular: Normal rate.   Pulmonary/Chest: Effort normal. No respiratory distress.  Abdominal: Soft. She exhibits no distension.  Musculoskeletal: Normal range of motion. She  exhibits no edema.  Neurological: She is alert and oriented to person, place, and time.  Skin: Skin is warm and dry.    ED Course  Procedures (including critical care time)  DIAGNOSTIC STUDIES: Oxygen Saturation is 93% on room air, adequate by my interpretation.    COORDINATION OF CARE:  17:28- Discussed planned course of treatment with the patient, including blood work and an ACT consult, who is agreeable at this time.  Labs Reviewed  CBC  COMPREHENSIVE METABOLIC PANEL  ETHANOL  URINE RAPID DRUG SCREEN (HOSP PERFORMED)    Results for orders placed during the hospital encounter of 03/01/13  CBC      Result Value Range   WBC 6.5  4.0 - 10.5 K/uL   RBC 4.91  3.87 - 5.11 MIL/uL   Hemoglobin 14.2  12.0 - 15.0 g/dL   HCT 16.1  09.6 - 04.5 %    MCV 86.8  78.0 - 100.0 fL   MCH 28.9  26.0 - 34.0 pg   MCHC 33.3  30.0 - 36.0 g/dL   RDW 40.9  81.1 - 91.4 %   Platelets 267  150 - 400 K/uL  COMPREHENSIVE METABOLIC PANEL      Result Value Range   Sodium 144  135 - 145 mEq/L   Potassium 4.0  3.5 - 5.1 mEq/L   Chloride 107  96 - 112 mEq/L   CO2 28  19 - 32 mEq/L   Glucose, Bld 104 (*) 70 - 99 mg/dL   BUN 14  6 - 23 mg/dL   Creatinine, Ser 7.82  0.50 - 1.10 mg/dL   Calcium 9.5  8.4 - 95.6 mg/dL   Total Protein 7.6  6.0 - 8.3 g/dL   Albumin 3.9  3.5 - 5.2 g/dL   AST 29  0 - 37 U/L   ALT 19  0 - 35 U/L   Alkaline Phosphatase 79  39 - 117 U/L   Total Bilirubin 0.3  0.3 - 1.2 mg/dL   GFR calc non Af Amer 70 (*) >90 mL/min   GFR calc Af Amer 81 (*) >90 mL/min  ETHANOL      Result Value Range   Alcohol, Ethyl (B) <11  0 - 11 mg/dL  URINE RAPID DRUG SCREEN (HOSP PERFORMED)      Result Value Range   Opiates NONE DETECTED  NONE DETECTED   Cocaine POSITIVE (*) NONE DETECTED   Benzodiazepines NONE DETECTED  NONE DETECTED   Amphetamines NONE DETECTED  NONE DETECTED   Tetrahydrocannabinol NONE DETECTED  NONE DETECTED   Barbiturates NONE DETECTED  NONE DETECTED  PREGNANCY, URINE      Result Value Range   Preg Test, Ur NEGATIVE  NEGATIVE      1. Cocaine abuse   2. Bipolar disease, chronic       MDM    I personally performed the services described in this documentation, which was scribed in my presence. The recorded information has been reviewed and is accurate.         Roxy Horseman, PA-C 03/17/13 1530

## 2013-03-01 NOTE — ED Notes (Signed)
Pt states she is suicidal without a plan.  Denies HI.  Pt uses crack.  Last used yesterday.

## 2013-03-01 NOTE — [facility] Counselor (Signed)
[name], [facility] at [facility] reports a 300-hall bed is now available. She reviewed Pt's labs more medical clearance. Gave clinical report to Mickeal Skinner, MD who accepted Pt to the service of Dr. Geoffery Lyons, room 301-1.  Harlin Rain Patsy Baltimore, LPC, [facility] Assessment Counselor

## 2013-03-02 ENCOUNTER — Encounter (HOSPITAL_COMMUNITY): Payer: Self-pay | Admitting: *Deleted

## 2013-03-02 ENCOUNTER — Inpatient Hospital Stay (HOSPITAL_COMMUNITY)
Admission: AD | Admit: 2013-03-02 | Discharge: 2013-03-12 | DRG: 897 | Disposition: A | Discharge status: Home / Self Care | Payer: No Typology Code available for payment source | Source: Intra-hospital | Attending: Psychiatry | Admitting: Psychiatry

## 2013-03-02 DIAGNOSIS — F1010 Alcohol abuse, uncomplicated: Secondary | ICD-10-CM | POA: Diagnosis present

## 2013-03-02 DIAGNOSIS — F1410 Cocaine abuse, uncomplicated: Secondary | ICD-10-CM

## 2013-03-02 DIAGNOSIS — Z79899 Other long term (current) drug therapy: Secondary | ICD-10-CM

## 2013-03-02 DIAGNOSIS — F1420 Cocaine dependence, uncomplicated: Principal | ICD-10-CM

## 2013-03-02 DIAGNOSIS — F1210 Cannabis abuse, uncomplicated: Secondary | ICD-10-CM | POA: Diagnosis present

## 2013-03-02 DIAGNOSIS — R443 Hallucinations, unspecified: Secondary | ICD-10-CM

## 2013-03-02 DIAGNOSIS — F1910 Other psychoactive substance abuse, uncomplicated: Secondary | ICD-10-CM

## 2013-03-02 DIAGNOSIS — F319 Bipolar disorder, unspecified: Secondary | ICD-10-CM

## 2013-03-02 MED ORDER — MAGNESIUM HYDROXIDE 400 MG/5ML PO SUSP: 30.0000 mL | Freq: Every day | ORAL | Status: DC | PRN

## 2013-03-02 MED ORDER — ALUM & MAG HYDROXIDE-SIMETH 200-200-20 MG/5ML PO SUSP: 30.0000 mL | ORAL | Status: DC | PRN

## 2013-03-02 MED ORDER — TRAZODONE HCL 50 MG PO TABS
50.0000 mg | ORAL_TABLET | Freq: Every evening | ORAL | Status: DC | PRN
  Administered 2013-03-07 – 2013-03-10 (×3): 50 mg via ORAL
  Filled 2013-03-02 (×3): qty 1
  Filled 2013-03-02: qty 8

## 2013-03-02 MED ORDER — QUETIAPINE FUMARATE ER 50 MG PO TB24
100.0000 mg | ORAL_TABLET | Freq: Every day | ORAL | Status: DC
  Filled 2013-03-02 (×3): qty 2

## 2013-03-02 MED ORDER — ACETAMINOPHEN 325 MG PO TABS
650.0000 mg | ORAL_TABLET | Freq: Four times a day (QID) | ORAL | Status: DC | PRN
  Administered 2013-03-11: 650 mg via ORAL

## 2013-03-02 NOTE — Progress Notes (Signed)
Adult Psychoeducational Group Note  Date:  03/02/2013 Time:  9:25 PM  Group Topic/Focus:  Wrap-Up Group:   The focus of this group is to help patients review their daily goal of treatment and discuss progress on daily workbooks.  Participation Level:  Did Not Attend  Participation Quality:  N/A Patient did not attend  Additional Comments:  Patient remained in bed, sleeping, for the duration of wrap-up group this evening.   [place]plantis, Newton Pigg 03/02/2013, 9:25 PM

## 2013-03-02 NOTE — Progress Notes (Signed)
D:  Patient stayed in bed most of the day.  Woke up a few minutes ago asking for Gatorade and when dinner was going to be served.   A:  Patient was given some Gatorade and I heated a take out tray for her.  Patient was oriented to date and time.   R:  Pleasant and cooperative.  States she has slept all day as she is coming off of cocaine.
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
## 2013-03-02 NOTE — Clinical Social Work Note (Signed)
[facility] Group Notes: (Clinical Social Work)   03/02/2013      Type of Therapy:  Group Therapy   Participation Level:  Did Not Attend    Ambrose Mantle, LCSW 03/02/2013, 11:03 AM
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
## 2013-03-02 NOTE — ED Notes (Signed)
Pt aaox3.  Pt transported to [facility] 301-1 with staff and secuirty

## 2013-03-03 ENCOUNTER — Encounter (HOSPITAL_COMMUNITY): Payer: Self-pay | Admitting: Psychiatry

## 2013-03-03 DIAGNOSIS — F1420 Cocaine dependence, uncomplicated: Principal | ICD-10-CM

## 2013-03-03 DIAGNOSIS — F319 Bipolar disorder, unspecified: Secondary | ICD-10-CM

## 2013-03-03 MED ORDER — QUETIAPINE FUMARATE 50 MG PO TABS
50.0000 mg | ORAL_TABLET | Freq: Two times a day (BID) | ORAL | Status: DC
  Administered 2013-03-03 – 2013-03-12 (×18): 50 mg via ORAL
  Filled 2013-03-03 (×13): qty 1
  Filled 2013-03-03 (×2): qty 8
  Filled 2013-03-03 (×7): qty 1

## 2013-03-03 MED ORDER — QUETIAPINE FUMARATE 400 MG PO TABS
400.0000 mg | ORAL_TABLET | Freq: Every day | ORAL | Status: DC
  Administered 2013-03-03 – 2013-03-05 (×3): 400 mg via ORAL
  Filled 2013-03-03 (×6): qty 1

## 2013-03-03 MED ORDER — ENSURE COMPLETE PO LIQD
237.0000 mL | Freq: Two times a day (BID) | ORAL | Status: DC
  Administered 2013-03-05: 237 mL via ORAL

## 2013-03-03 MED ORDER — ADULT MULTIVITAMIN W/MINERALS CH
1.0000 | ORAL_TABLET | Freq: Every day | ORAL | Status: DC
  Administered 2013-03-03 – 2013-03-12 (×10): 1 via ORAL
  Filled 2013-03-03 (×11): qty 1

## 2013-03-03 NOTE — Progress Notes (Signed)
Pt did not attend group; stayed in bed.

## 2013-03-03 NOTE — Plan of Care (Signed)
Problem: Alteration in mood & ability to function due to Goal: LTG-Patient demonstrates decreased signs of withdrawal (Patient demonstrates decreased signs of withdrawal to the point the patient is safe to return home and continue treatment in an outpatient setting)  Outcome: Not Progressing Patient admitted today, still in bed and not attending groups.   Problem: Ineffective individual coping Goal: STG: Patient will participate in after care plan Outcome: Not Progressing Patient just admitted today; did not attend discharge planning group.

## 2013-03-03 NOTE — Progress Notes (Signed)
Recreation Therapy Notes  Date: 03.10.2014 Time: 3:00pm  Location: [facility] Gym   Group Topic/Focus: Stress Managment   Participation Level:  Active   Participation Quality:  Appropriate  Affect:  Euthymic   Cognitive:  Appropriate    Additional Comments: Patient participated in stress management workshop. Patient rated stress level at a 10 out of 10 prior to session starting. Patient listened to recording of guided imagery script. Patient participated in progressive muscle relaxation and deep breathing delivered by LRT. Patient rated stress level at a 9 out of 10 at the end of the session. Patient stated she enjoyed the session.    Jearl Klinefelter, LRT/ CTRS   Aldrich, Denise L 03/03/2013 4:10 PM

## 2013-03-03 NOTE — Progress Notes (Signed)
D)  Attempted to finish admission, pt unable to give complete answers, was falling asleep mid-sentence.  Was given salad, which she had very little of,  Tried to encourage fluids, locked up her things in locker, was taken to her room where she went to bed and was asleep almost as soon as she got in bed.  Will attempt to finish later. A)  Will continue to monitor for safety, continue POC R)  Safety maintained.

## 2013-03-03 NOTE — Progress Notes (Signed)
[facility] LCSW Aftercare Discharge Planning Group Note  03/03/2013   Participation Quality:  Did Not Attend   Harrill, Catherine Campbell  

## 2013-03-03 NOTE — Tx Team (Signed)
Interdisciplinary Treatment Plan Update (Adult)  Date: 03/03/2013  Time Reviewed: 9:43 AM   Progress in Treatment: Attending groups: Yes Participating in groups: Yes Taking medication as prescribed:  Yes Tolerating medication:  Yes Family/Significant othe contact made: No Patient understands diagnosis: Yes Discussing patient identified problems/goals with staff: Yes Medical problems stabilized or resolved:  Yes Denies suicidal/homicidal ideation: No Patient has not harmed self or Others: Yes  New problem(s) identified: None Identified  Discharge Plan or Barriers:  CSW is assessing for appropriate referrals.   Additional comments: N/A  Reason for Continuation of Hospitalization Anxiety Delusions  Depression Suicidal ideation Withdrawal symptoms   Estimated length of stay: 5-7 days  For review of initial/current patient goals, please see plan of care.  Attendees: Patient:     Family:     Physician:  Geoffery Lyons 03/03/2013 9:43 AM   Nursing:   Robbie Louis, RN 03/03/2013 9:43 AM   Clinical Social Worker Ronda Fairly 03/03/2013 9:43 AM   Other:  Concha Se, [place] PA Student 03/03/2013 9:43 AM   Other:   03/03/2013 9:43 AM   Other:   03/03/2013 9:43 AM   Other:   03/03/2013 9:43 AM    Scribe for Treatment Team:   [name], LCSWA  03/03/2013 9:43 AM

## 2013-03-03 NOTE — [facility] Suicide Risk Assessment (Signed)
Suicide Risk Assessment  Admission Assessment     Nursing information obtained from:    Demographic factors:    Current Mental Status:    Loss Factors:    Historical Factors:    Risk Reduction Factors:     CLINICAL FACTORS:   Bipolar Disorder:   Depressive phase Alcohol/Substance Abuse/Dependencies  COGNITIVE FEATURES THAT CONTRIBUTE TO RISK:  Closed-mindedness Polarized thinking Thought constriction (tunnel vision)    SUICIDE RISK:   Moderate:  Frequent suicidal ideation with limited intensity, and duration, some specificity in terms of plans, no associated intent, good self-control, limited dysphoria/symptomatology, some risk factors present, and identifiable protective factors, including available and accessible social support.  PLAN OF CARE: Supportive approach/coping skills/relapse prevention                               Resume Seroquel 50 mg BID, Seroquel 400 mg HS  I certify that inpatient services furnished can reasonably be expected to improve the patient's condition.  LUGO,IRVING A 03/03/2013, 6:33 PM

## 2013-03-03 NOTE — Progress Notes (Signed)
[facility] LCSW Group Therapy  03/03/2013  Type of Therapy:  Group Therapy 1:15 - 2:30  Participation Level:  Did Not Attend    [name]

## 2013-03-03 NOTE — Progress Notes (Signed)
Patient ID: [name], female   DOB: 08-Feb-1961, 52 y.o.   MRN: [id number] D)  Has spent the evening in bed sleeping, eyes closed, resp reg, unlabored.  Was awake briefly, had part of a salad and went back to sleep.  Tried to talk to her to finish admission questions, was falling asleep while she was talking.   A)  Will continue to monitor for safety,  R)  Safety maintained at this time.
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
## 2013-03-04 NOTE — Progress Notes (Signed)
[facility] MD Progress Note  03/04/2013 11:37 AM [name]  MRN:  [id number]  Subjective:  Ms. Lutze reports today that she came back to this hospital because she was having a hard time quitting cocaine use. She states that she has been using constantly for 2 months. She states that she was sober for 2 months after getting discharged from this hospital last January. She is currently endorsing both suicidal ideations and auditory hallucinations. She states that the voices are mocking her, telling her to kill herself, and that she is worthless and useless. She rated her depression at #6, and anxiety at #8. She states that she feels safe here. Denies any plans and or intent to hurt her self and or others.  Diagnosis:   Axis I: Polysubstance abuse, Cocaine addiction, Cocaine abuse Axis II: Deferred Axis III:  Past Medical History  Diagnosis Date  . Depression   . Bipolar 1 disorder   . Drug abuse    Axis IV: other psychosocial or environmental problems and Substance abuse issues Axis V: 41-50 serious symptoms  ADL's:  Impaired  Sleep: Fair  Appetite:  Fair  Suicidal Ideation: "Yes" Plan:  No Intent:  No Means:  No Homicidal Ideation:  Plan:  No Intent:  No Means:  No  AEB (as evidenced by): Per patient's reports.  Psychiatric Specialty Exam: Review of Systems  Constitutional: Negative.   HENT: Negative.   Eyes: Negative.   Respiratory: Negative.   Cardiovascular: Negative.   Gastrointestinal: Negative.   Genitourinary: Negative.   Musculoskeletal: Negative.   Skin: Negative.   Neurological: Negative.   Endo/Heme/Allergies: Negative.   Psychiatric/Behavioral: Positive for depression (Rated depression at #6), suicidal ideas, hallucinations (Reports auditory hallucinations) and substance abuse (Hx cocaine abuse). Negative for memory loss. The patient is nervous/anxious (Rated anxiety at #8). The patient does not have insomnia.     Blood pressure 120/85, pulse 91,  temperature 98.2 F (36.8 C), temperature source Oral, resp. rate 16, height 5' (1.524 m), weight 52.844 kg (116 lb 8 oz).Body mass index is 22.75 kg/(m^2).  General Appearance: Disheveled  Eye Contact::  Poor  Speech:  Clear and Coherent  Volume:  Normal  Mood:  Anxious, Depressed and Hopeless  Affect:  Flat  Thought Process:  Coherent and Intact  Orientation:  Full (Time, Place, and Person)  Thought Content:  Hallucinations: Auditory and Rumination  Suicidal Thoughts:  Yes.  without intent/plan  Homicidal Thoughts:  No  Memory:  Immediate;   Good Recent;   Good Remote;   Good  Judgement:  Impaired  Insight:  Shallow  Psychomotor Activity:  Anxious  Concentration:  Fair  Recall:  Good  Akathisia:  No  Handed:  Right  AIMS (if indicated):     Assets:  Desire for Improvement  Sleep:  Number of Hours: 6   Current Medications: Current Facility-Administered Medications  Medication Dose Route Frequency Provider Last Rate Last Dose  . acetaminophen (TYLENOL) tablet 650 mg  650 mg Oral Q6H PRN Mickeal Skinner, MD      . alum & mag hydroxide-simeth (MAALOX/MYLANTA) 200-200-20 MG/5ML suspension 30 mL  30 mL Oral Q4H PRN Mickeal Skinner, MD      . feeding supplement (ENSURE COMPLETE) liquid 237 mL  237 mL Oral BID BM Rachael Fee, MD      . magnesium hydroxide (MILK OF MAGNESIA) suspension 30 mL  30 mL Oral Daily PRN Mickeal Skinner, MD      . multivitamin with minerals tablet 1 tablet  1 tablet Oral Daily Rachael Fee, MD   1 tablet at 03/04/13 [phone]  . QUEtiapine (SEROQUEL) tablet 400 mg  400 mg Oral QHS Rachael Fee, MD   400 mg at 03/03/13 2344  . QUEtiapine (SEROQUEL) tablet 50 mg  50 mg Oral BID Rachael Fee, MD   50 mg at 03/04/13 [phone]  . traZODone (DESYREL) tablet 50 mg  50 mg Oral QHS PRN Mickeal Skinner, MD        Lab Results: No results found for this or any previous visit (from the past 48 hour(s)).  Physical Findings: AIMS: Facial and Oral Movements Muscles of Facial  Expression: None, normal Lips and Perioral Area: None, normal Jaw: None, normal Tongue: None, normal,Extremity Movements Upper (arms, wrists, hands, fingers): None, normal Lower (legs, knees, ankles, toes): None, normal, Trunk Movements Neck, shoulders, hips: None, normal, Overall Severity Severity of abnormal movements (highest score from questions above): None, normal Incapacitation due to abnormal movements: None, normal Patient's awareness of abnormal movements (rate only patient's report): No Awareness,    CIWA:    COWS:     Treatment Plan Summary: Daily contact with patient to assess and evaluate symptoms and progress in treatment Medication management  Plan: Supportive approach/coping skills/relapse prevention. Encouraged out of room, participation in group sessions and application of coping skills when distressed. Will continue to monitor response to/adverse effects of medications in use to assure effectiveness. Continue to monitor mood, behavior and interaction with staff and other patients. Continue current plan of care.  Medical Decision Making Problem Points:  Established problem, stable/improving (1), Review of last therapy session (1) and Review of psycho-social stressors (1) Data Points:  Review and summation of old records (2) Review of medication regiment & side effects (2)  I certify that inpatient services furnished can reasonably be expected to improve the patient's condition.   Sanjuana Kava, FNP, PMHNP-BC 03/04/2013, 11:37 AM

## 2013-03-04 NOTE — Progress Notes (Signed)
Pt did not attend AA group; stayed in bed.

## 2013-03-04 NOTE — Progress Notes (Signed)
Pt has been in bed most of the day she did get up for a few groups but went right back to bed.  She did go sown for her meals and came to medication window for her meds.  She rated both her depression and hopelessness a 101 and her anxiety a 5 on her self-inventory.  She still has some passive S/I no plan and contracts for safety in the hospital.  She is unsure of her follow up at this time.  She did talk about her 50 year old son in [place] who she has never had custody and does contact him at times.  He lives with his father and grandparents.  She did not want to raise him the way she raised her daughter that has substance abuse issues too.  She talked about her daughter has gotten clean and sober and is attending church.  Her daughter has no children and is 49 she stated," I really don't believe she can have kids anyway"

## 2013-03-04 NOTE — Progress Notes (Signed)
[facility] LCSW Aftercare Discharge Planning Group Note  03/04/2013   Participation Quality:  Did Not Attend  [name] 03/04/2013, 12:48 PM

## 2013-03-04 NOTE — Progress Notes (Signed)
Recreation Therapy Notes Date: 03.11.2014 Time: 3:00pm Location: [facility] Courtyard      Group Topic/Focus: Goal Setting  Participation Level: Did not attend   Denise L Blanchfield, LRT/CTRS  Blanchfield, Denise L 03/04/2013 4:39 PM 

## 2013-03-04 NOTE — Progress Notes (Signed)
Patient ID: [name], female   DOB: March 17, 1961, 52 y.o.   MRN: [id number]  D: Pt was laying in bed during the assessment. Pt did not engage well with the writer during assessment. Writer asked pt about her day and what info did she have regarding her conversations with the doctor. Informed the writer that she plans to attend a 2 week program in [place]. Stated the dr told her she'll be here "a few more days".  Pt stated she attended 2 groups today.  A:  Support and encouragement was offered. Encouraged pt to attend more groups. 15 min checks continued for safety.  R: Pt remains safe.

## 2013-03-05 NOTE — Progress Notes (Signed)
Patient ID: [name], female   DOB: 01-16-1961, 52 y.o.   MRN: [id number]  D: Pt was laying in bed during the assessment. Writer attempted to encourage pt to attend group. Pt stated, "I think I have a urinary infection".  Writer informed pt that info would be passed on to the extender. Pt was laying in bed with only her bra and panties and complained of "hot flashes".  Eventually, pt did say that her dr is "trying to get her into a 2 wk program, but there are no beds available".  A:  Support and encouragement was offered. 15 min checks continued for safety.  R: Pt remains safe.

## 2013-03-05 NOTE — Progress Notes (Signed)
Patient ID: [name], female   DOB: 1961/02/18, 52 y.o.   MRN: [id number] She has been up for part of the groups and in bed remainder of times. Stated" I am tired" denied any other discomfort . Self inventory: depressed , Hopeless, SI on and off. W/d dizziness and lightheaded and agitation.(none noted).

## 2013-03-05 NOTE — Progress Notes (Signed)
[facility] LCSW Aftercare Discharge Planning Group Note  03/05/2013 9:28 AM  Participation Quality:  Attentive and Drowsy  Affect:  Blunted, Depressed and Flat  Cognitive:  Appropriate  Insight:  Limited  Engagement in Group:  Limited  Modes of Intervention:  Discussion, Education, Problem-solving and Rapport Building  Summary of Progress/Problems: Pt attended dc planning group.  Her affect was flat and depressed.  Pt posture was closed and hunched over.  Pt reported that she was "so so " this morning.  She rated depression, anxiety, hopelessness, and helplessness at six which she stated is higher than usual.  Pt endorses passive SI and is able to contract for safety. Pt reports that she plans to follow up at [facility] at dc.  Bournes, Roshelle L 03/05/2013, 9:28 AM

## 2013-03-05 NOTE — Progress Notes (Signed)
[name] LCSW Group Therapy  03/05/2013 2:39 PM  Type of Therapy:  Group Therapy  Participation Level:  Did Not Attend   Bournes, Roshelle L 03/05/2013, 2:39 PM

## 2013-03-05 NOTE — Progress Notes (Signed)
[facility] MD Progress Note  03/05/2013 3:35 PM [name]  MRN:  [id number] Subjective:  [name] does not feel well. She is experiencing a lot of anxiety and depression. She states that she does not want to go back to using. Admits to wanting to die before going back to doing what she was doing before she came here. 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 State she would like to be that person again but does not know how. Admits to being hopeless. She feels num, sedated. States that she did not sleep well last night but that she does not want an increase in medications. Wants to go to rehab from here as afraid she is not going to make it otherwise Diagnosis:  Bipolar Disorder, Cocaine Dependence  ADL's:  Intact  Sleep: Poor  Appetite:  Poor  Suicidal Ideation:  Plan:  denies Intent:  denie Means:  denies Homicidal Ideation:  Plan:  denies Intent:  denies Means:  denies AEB (as evidenced by):  Psychiatric Specialty Exam: Review of Systems  Constitutional: Negative.   HENT: Negative.   Eyes: Negative.   Respiratory: Negative.   Cardiovascular: Negative.   Gastrointestinal: Negative.   Genitourinary: Negative.   Musculoskeletal: Negative.   Skin: Negative.   Neurological: Negative.   Endo/Heme/Allergies: Negative.   Psychiatric/Behavioral: Positive for depression, suicidal ideas and substance abuse. The patient is nervous/anxious and has insomnia.     Blood pressure 116/69, pulse 74, temperature 97.4 F (36.3 C), temperature source Oral, resp. rate 16, height 5' (1.524 m), weight 52.844 kg (116 lb 8 oz).Body mass index is 22.75 kg/(m^2).  General Appearance: Disheveled  Eye Contact::  Minimal  Speech:  Clear and Coherent and Slow  Volume:  Decreased  Mood:  Anxious, Depressed and Hopeless  Affect:  Depressed  Thought Process:  Coherent and Goal Directed  Orientation:  Full  (Time, Place, and Person)  Thought Content:  Rumination and worries, concerns, fears  Suicidal Thoughts:  Fleeting when she does not see a way out  Homicidal Thoughts:  No  Memory:  Immediate;   Fair Recent;   Fair Remote;   Fair  Judgement:  Fair  Insight:  Present  Psychomotor Activity:  Restlessness  Concentration:  Fair  Recall:  Fair  Akathisia:  No  Handed:  Right  AIMS (if indicated):     Assets:  Desire for Improvement  Sleep:  Number of Hours: 6.5   Current Medications: Current Facility-Administered Medications  Medication Dose Route Frequency Provider Last Rate Last Dose  . acetaminophen (TYLENOL) tablet 650 mg  650 mg Oral Q6H PRN Mickeal Skinner, MD      . alum & mag hydroxide-simeth (MAALOX/MYLANTA) 200-200-20 MG/5ML suspension 30 mL  30 mL Oral Q4H PRN Mickeal Skinner, MD      . feeding supplement (ENSURE COMPLETE) liquid 237 mL  237 mL Oral BID BM Rachael Fee, MD      . magnesium hydroxide (MILK OF MAGNESIA) suspension 30 mL  30 mL Oral Daily PRN Mickeal Skinner, MD      . multivitamin with minerals tablet 1 tablet  1 tablet Oral Daily Rachael Fee, MD   1 tablet at 03/05/13 0817  . QUEtiapine (SEROQUEL) tablet 400 mg  400 mg Oral QHS Rachael Fee, MD   400 mg at 03/04/13 2149  . QUEtiapine (SEROQUEL) tablet 50 mg  50 mg Oral BID Rachael Fee, MD   50 mg at 03/05/13 0817  . traZODone (DESYREL) tablet 50 mg  50 mg Oral QHS PRN Mickeal Skinner, MD        Lab Results: No results found for this or any previous visit (from the past 48 hour(s)).  Physical Findings: AIMS: Facial and Oral Movements Muscles of Facial Expression: None, normal Lips and Perioral Area: None, normal Jaw: None, normal Tongue: None, normal,Extremity Movements Upper (arms, wrists, hands, fingers): None, normal Lower (legs, knees, ankles, toes): None, normal, Trunk Movements Neck, shoulders, hips: None, normal, Overall Severity Severity of abnormal movements (highest score from questions  above): None, normal Incapacitation due to abnormal movements: None, normal Patient's awareness of abnormal movements (rate only patient's report): No Awareness,    CIWA:    COWS:     Treatment Plan Summary: Daily contact with patient to assess and evaluate symptoms and progress in treatment Medication management  Plan: Supportive approach/coping skills/relapse prevention  Medical Decision Making Problem Points:  Review of last therapy session (1) and Review of psycho-social stressors (1) Data Points:  Review of medication regiment & side effects (2)  I certify that inpatient services furnished can reasonably be expected to improve the patient's condition.   LUGO,IRVING A 03/05/2013, 3:35 PM

## 2013-03-05 NOTE — Progress Notes (Signed)
Psychoeducational Group Note  Date:  03/05/2013 Time:  2000  Group Topic/Focus:  AA group  Participation Level: Did Not Attend  Participation Quality:  Not Applicable  Affect:  Not Applicable  Cognitive:  Not Applicable  Insight:  Not Applicable  Engagement in Group: Not Applicable  Additional Comments:    Flonnie Hailstone 03/05/2013, 8:38 PM

## 2013-03-05 NOTE — Progress Notes (Signed)
Adult Psychoeducational Group Note  Date:  03/05/2013 Time: 11;00am  Group Topic/Focus:  Personal Choices and Values:   The focus of this group is to help patients assess and explore the importance of values in their lives, how their values affect their decisions, how they express their values and what opposes their expression.  Participation Level:  Did Not Attend  Participation Quality:    Affect:    Cognitive:    Insight:   Engagement in Group:    Modes of Intervention:    Additional Comments:  Pt refused to come. Pt has been in the bed all day.  [name] M 03/05/2013, 1:05 PM

## 2013-03-05 NOTE — Tx Team (Signed)
Interdisciplinary Treatment Plan Update (Adult)  Date: 03/05/2013  Time Reviewed: 4:14 PM   Progress in Treatment: Attending groups:No, pt has missed groups intermittently, complaining of tiredness. Participating in groups: Yes Taking medication as prescribed:  Yes Tolerating medication:  Yes Family/Significant other contact made: No Patient understands diagnosis: Yes Discussing patient identified problems/goals with staff: Yes Medical problems stabilized or resolved:  Yes Denies suicidal/homicidal ideation: No Patient has not harmed self or Others: Yes  New problem(s) identified: None Identified  Discharge Plan or Barriers: Pt will follow up with [facility] for medication management and therapy.  Additional comments: N/A  Reason for Continuation of Hospitalization Anxiety Delusions  Depression Suicidal ideation Withdrawal symptoms   Estimated length of stay: 3-5 days  For review of initial/current patient goals, please see plan of care.  Attendees: Patient:     Family:     Physician:  Geoffery Lyons 03/05/2013 9:43 AM   Nursing:   Robbie Louis, RN 03/05/2013 9:43 AM   Other: Foye Clock, MSW Intern 03/05/2013 9:43 AM   Other:  Concha Se, [employer] PA Student 03/05/2013 9:43 AM   Other:   03/05/2013 9:43 AM   Other:   03/05/2013 9:43 AM   Other:   03/05/2013 9:43 AM    Scribe for Treatment Team:   Rico Ala   03/05/2013 9:43 AM

## 2013-03-06 LAB — URINALYSIS, ROUTINE W REFLEX MICROSCOPIC
Bilirubin Urine: NEGATIVE
Glucose, UA: NEGATIVE mg/dL
Ketones, ur: NEGATIVE mg/dL
Nitrite: NEGATIVE
Specific Gravity, Urine: 1.022 (ref 1.005–1.030)
pH: 6 (ref 5.0–8.0)

## 2013-03-06 LAB — URINE CULTURE: Colony Count: NO GROWTH

## 2013-03-06 MED ORDER — QUETIAPINE FUMARATE 400 MG PO TABS
500.0000 mg | ORAL_TABLET | Freq: Every day | ORAL | Status: DC
  Administered 2013-03-06 – 2013-03-07 (×2): 500 mg via ORAL
  Administered 2013-03-08: 400 mg via ORAL
  Filled 2013-03-06 (×5): qty 1

## 2013-03-06 MED ORDER — FLUCONAZOLE 150 MG PO TABS
150.0000 mg | ORAL_TABLET | Freq: Once | ORAL | Status: AC
  Administered 2013-03-06: 150 mg via ORAL
  Filled 2013-03-06: qty 1

## 2013-03-06 NOTE — Progress Notes (Addendum)
D:  Patient's self inventory sheet, patient has poor sleep, improving appetite, low energy level, poor attention span.   Rated depression and hopelessness #7.   Has felt agitation in past 24 hours.  SI off/on, contracts for safety.   Physical problems are lightheadedness, dizziness, blurred vision in past 24 hours.  Pain goal today #3, worst pain #3.  After discharge, plans to stop using.  No questions for staff.  No discharge plans.  No problems taking meds after discharge. A:  Medications administered per MD order.  Support and encouragement given throughout day. R:  Following treatment plan.   Denied HI.  Denied A/V hallucinations.  SI off/on, contracts for safety. UA collected for lab pickup.

## 2013-03-06 NOTE — Progress Notes (Signed)
D   Pt spent most of the shift in bed   She rarely attends groups   She reports feeling tired all the time   She is cooperative and pleasant on approach  She continues to complain that the generic medications she receives here do not work as well as the brand name A   Verbal support given   Medications administered and effectiveness monitored  Q 15 min checks R  Pt safe at present

## 2013-03-06 NOTE — Progress Notes (Signed)
[facility] LCSW Group Therapy  03/06/2013 2:40 PM  Type of Therapy:  Group Therapy  Participation Level:  Did Not Attend    [name] L 03/06/2013, 2:40 PM

## 2013-03-06 NOTE — Progress Notes (Signed)
[facility] LCSW Aftercare Discharge Planning Group Note  03/06/2013 11:45 AM  Participation Quality:  Drowsy  Affect:  Blunted and Depressed  Cognitive:  Alert and Appropriate  Insight:  Developing/Improving  Engagement in Group:  Developing/Improving and Engaged  Modes of Intervention:  Education, Dentist, Scientist, physiological of Progress/Problems: Pts affect remains depressed, however, she is visibly brighter than previous day.   Pt rates all symptoms at seven and endorses passive SI.  Pt contracts for safety.    Bournes, Roshelle L 03/06/2013, 11:45 AM

## 2013-03-06 NOTE — Progress Notes (Signed)
Patient ID: [name], female   DOB: [birth year], 52 y.o.   MRN: [id number] [facility] MD Progress Note  03/06/2013 2:42 PM [name]  MRN:  [id number]  Subjective:  [name] reports that she is feeling some better, but not a whole lot. She is endorsing sleeplessness, blames it on Seroquel being generic. Thinks the name brand Seroquel works better for her and helps her sleep at night. She reports feeling hot and cold interchangeably. She is hearing voices off and on. Not quite suicidal today, but has fleeting thoughts, Reports increased vaginal discharge and itching"    Diagnosis:  Bipolar Disorder, Cocaine Dependence  ADL's:  Intact  Sleep: Poor  Appetite:  Poor  Suicidal Ideation:  Plan:  denies Intent:  denie Means:  denies Homicidal Ideation:  Plan:  denies Intent:  denies Means:  denies  AEB (as evidenced by):  Psychiatric Specialty Exam: Review of Systems  Constitutional: Negative.   HENT: Negative.   Eyes: Negative.   Respiratory: Negative.   Cardiovascular: Negative.   Gastrointestinal: Negative.   Genitourinary: Negative.   Musculoskeletal: Negative.   Skin: Negative.   Neurological: Negative.   Endo/Heme/Allergies: Negative.   Psychiatric/Behavioral: Positive for depression, suicidal ideas and substance abuse. The patient is nervous/anxious and has insomnia.     Blood pressure 124/79, pulse 76, temperature 97.7 F (36.5 C), temperature source Oral, resp. rate 16, height 5' (1.524 m), weight 52.844 kg (116 lb 8 oz).Body mass index is 22.75 kg/(m^2).  General Appearance: Disheveled  Eye Contact::  Minimal  Speech:  Clear and Coherent and Slow  Volume:  Decreased  Mood:  Anxious, Depressed and Hopeless  Affect:  Depressed  Thought Process:  Coherent and Goal Directed  Orientation:  Full (Time, Place, and Person)  Thought Content:  Rumination and worries, concerns, fears  Suicidal Thoughts:  Fleeting when she does not see a way out  Homicidal Thoughts:   No  Memory:  Immediate;   Fair Recent;   Fair Remote;   Fair  Judgement:  Fair  Insight:  Present  Psychomotor Activity:  Restlessness  Concentration:  Fair  Recall:  Fair  Akathisia:  No  Handed:  Right  AIMS (if indicated):     Assets:  Desire for Improvement  Sleep:  Number of Hours: 5.5   Current Medications: Current Facility-Administered Medications  Medication Dose Route Frequency Provider Last Rate Last Dose  . acetaminophen (TYLENOL) tablet 650 mg  650 mg Oral Q6H PRN Mickeal Skinner, MD      . alum & mag hydroxide-simeth (MAALOX/MYLANTA) 200-200-20 MG/5ML suspension 30 mL  30 mL Oral Q4H PRN Mickeal Skinner, MD      . feeding supplement (ENSURE COMPLETE) liquid 237 mL  237 mL Oral BID BM Rachael Fee, MD   237 mL at 03/05/13 2231  . magnesium hydroxide (MILK OF MAGNESIA) suspension 30 mL  30 mL Oral Daily PRN Mickeal Skinner, MD      . multivitamin with minerals tablet 1 tablet  1 tablet Oral Daily Rachael Fee, MD   1 tablet at 03/06/13 0805  . QUEtiapine (SEROQUEL) tablet 400 mg  400 mg Oral QHS Rachael Fee, MD   400 mg at 03/05/13 2227  . QUEtiapine (SEROQUEL) tablet 50 mg  50 mg Oral BID Rachael Fee, MD   50 mg at 03/06/13 0806  . traZODone (DESYREL) tablet 50 mg  50 mg Oral QHS PRN Mickeal Skinner, MD        Lab Results:  Results  for orders placed during the hospital encounter of 03/02/13 (from the past 48 hour(s))  URINALYSIS, ROUTINE W REFLEX MICROSCOPIC     Status: Abnormal   Collection Time    03/05/13 10:39 PM      Result Value Range   Color, Urine YELLOW  YELLOW   APPearance CLOUDY (*) CLEAR   Specific Gravity, Urine 1.022  1.005 - 1.030   pH 6.0  5.0 - 8.0   Glucose, UA NEGATIVE  NEGATIVE mg/dL   Hgb urine dipstick NEGATIVE  NEGATIVE   Bilirubin Urine NEGATIVE  NEGATIVE   Ketones, ur NEGATIVE  NEGATIVE mg/dL   Protein, ur NEGATIVE  NEGATIVE mg/dL   Urobilinogen, UA 0.2  0.0 - 1.0 mg/dL   Nitrite NEGATIVE  NEGATIVE   Leukocytes, UA NEGATIVE  NEGATIVE    Comment: MICROSCOPIC NOT DONE ON URINES WITH NEGATIVE PROTEIN, BLOOD, LEUKOCYTES, NITRITE, OR GLUCOSE <1000 mg/dL.    Physical Findings: AIMS: Facial and Oral Movements Muscles of Facial Expression: None, normal Lips and Perioral Area: None, normal Jaw: None, normal Tongue: None, normal,Extremity Movements Upper (arms, wrists, hands, fingers): None, normal Lower (legs, knees, ankles, toes): None, normal, Trunk Movements Neck, shoulders, hips: None, normal, Overall Severity Severity of abnormal movements (highest score from questions above): None, normal Incapacitation due to abnormal movements: None, normal Patient's awareness of abnormal movements (rate only patient's report): No Awareness, Dental Status Current problems with teeth and/or dentures?: No Does patient usually wear dentures?: No  CIWA:  CIWA-Ar Total: 1 COWS:  COWS Total Score: 1  Treatment Plan Summary: Daily contact with patient to assess and evaluate symptoms and progress in treatment Medication management  Plan: Supportive approach/coping skills/relapse prevention. Diflucan 150 mg po once for yeast infection. Increase Seroquel from 400 mg to 500 mg Q bedtime for sleep. Coping skills for depression and anxiety--encouraged patient to stay out of bed during the day and try to stay in the dayroom. Continue crisis stabilization and management Address health issues.  Medical Decision Making Problem Points:  Review of last therapy session (1) and Review of psycho-social stressors (1) Data Points:  Review of medication regiment & side effects (2)  I certify that inpatient services furnished can reasonably be expected to improve the patient's condition.   Armandina Stammer I 03/06/2013, 2:42 PM

## 2013-03-06 NOTE — Progress Notes (Signed)
Staff suggested that pt attend group, but pt refused. Pt remained in her room resting in bed for the duration of the group.

## 2013-03-06 NOTE — Progress Notes (Signed)
Recreation Therapy Notes  Date: 03.13.2014 Time: 3:00pm Location: [address] Day Room       Group Topic/Focus: Leisure Education  Participation Level: Did not attend  [employer], LRT/CTRS   Jearl Klinefelter 03/06/2013 4:13 PM

## 2013-03-06 NOTE — Progress Notes (Signed)
Patient resting quietly with eyes closed. Respirations even and unlabored. No distress noted and Q 15 minute check continues as ordered to maintain safety.

## 2013-03-06 NOTE — Progress Notes (Signed)
Psychoeducational Group Note  Date:  03/06/2013 Time:2100  Group Topic/Focus:  wrap up group  Participation Level: Did Not Attend  Participation Quality:  Not Applicable  Affect:  Not Applicable  Cognitive:  Not Applicable  Insight:  Not Applicable  Engagement in Group: Not Applicable  Additional Comments:  Pt notified that group was beginning but remained in bed.  Shelah Lewandowsky 03/06/2013, 11:22 PM

## 2013-03-07 NOTE — Progress Notes (Signed)
[facility] Group Notes:  (Nursing/MHT/Case Management/Adjunct)  Date:  03/07/2013  Time:  11:28 AM  Type of Therapy:  Psychoeducational Skills  Participation Level:  Did Not Attend  Dalia Heading 03/07/2013, 11:28 AM

## 2013-03-07 NOTE — Progress Notes (Signed)
[facility] MD Progress Note  03/07/2013 5:05 PM [name]  MRN:  [id number] Subjective:  [name] continues to have [name] hard time. She admits to feeling very anxious, worried, with intermittent suicidal ideations. He has not have urges to use, what she says is [name] major improvement for her as by now she would have been wanting to get out of here to use. She is dealing with [name] lot of worry and concerns as thinks that this is her last chance. She endorses that the suicidal ideas come when she starts thinking about having to go trough this again. At times hopelessness takes over Diagnosis:  Cocaine dependence, Mood Disorder NOS  ADL's:  Intact  Sleep: not restful  Appetite:  Fair  Suicidal Ideation:  Plan:  ideas, no plans Intent:  denies Means:  denies Homicidal Ideation:  Plan:  denies AEB (as evidenced by):  Psychiatric Specialty Exam: Review of Systems  Constitutional: Negative.   HENT: Negative.   Eyes: Negative.   Respiratory: Negative.   Cardiovascular: Negative.   Gastrointestinal: Negative.   Genitourinary: Negative.   Musculoskeletal: Negative.   Skin: Negative.   Neurological: Negative.   Endo/Heme/Allergies: Negative.   Psychiatric/Behavioral: Positive for depression, suicidal ideas and substance abuse. The patient is nervous/anxious and has insomnia.     Blood pressure 125/74, pulse 94, temperature 98.3 F (36.8 C), temperature source Oral, resp. rate 18, height 5' (1.524 m), weight 52.844 kg (116 lb 8 oz).Body mass index is 22.75 kg/(m^2).  General Appearance: Disheveled  Eye Solicitor::  Fair  Speech:  Clear and Coherent and rapid  Volume:  Increased  Mood:  Anxious, Depressed and worried, at times hopeless  Affect:  anxious  Thought Process:  Coherent and Goal Directed  Orientation:  Full (Time, Place, and Person)  Thought Content:  Rumination and worries and concerns  Suicidal Thoughts:  Yes.  without intent/plan  Homicidal Thoughts:  No  Memory:  Immediate;    Fair Recent;   Fair Remote;   Fair  Judgement:  Fair  Insight:  superficial  Psychomotor Activity:  Restlessness  Concentration:  Fair  Recall:  Fair  Akathisia:  No  Handed:  Right  AIMS (if indicated):     Assets:  Desire for Improvement  Sleep:  Number of Hours: 6   Current Medications: Current Facility-Administered Medications  Medication Dose Route Frequency [name] Last Rate Last Dose  . acetaminophen (TYLENOL) tablet 650 mg  650 mg Oral Q6H PRN Stephanie Skinner, MD      . alum & mag hydroxide-simeth (MAALOX/MYLANTA) 200-200-20 MG/5ML suspension 30 mL  30 mL Oral Q4H PRN Stephanie Skinner, MD      . feeding supplement (ENSURE COMPLETE) liquid 237 mL  237 mL Oral BID BM Stephanie Fee, MD   237 mL at 03/05/13 2231  . magnesium hydroxide (MILK OF MAGNESIA) suspension 30 mL  30 mL Oral Daily PRN Stephanie Skinner, MD      . multivitamin with minerals tablet 1 tablet  1 tablet Oral Daily Stephanie Fee, MD   1 tablet at 03/07/13 [phone]  . QUEtiapine (SEROQUEL) tablet 50 mg  50 mg Oral BID Stephanie Fee, MD   50 mg at 03/07/13 1702  . QUEtiapine (SEROQUEL) tablet 500 mg  500 mg Oral QHS Stephanie Kava, NP   500 mg at 03/06/13 2127  . traZODone (DESYREL) tablet 50 mg  50 mg Oral QHS PRN Stephanie Skinner, MD        Lab Results:  Physical Findings: AIMS: Facial and Oral Movements Muscles of Facial Expression: None, normal Lips and Perioral Area: None, normal Jaw: None, normal Tongue: None, normal,Extremity Movements Upper (arms, wrists, hands, fingers): None, normal Lower (legs, knees, ankles, toes): None, normal, Trunk Movements Neck, shoulders, hips: None, normal, Overall Severity Severity of abnormal movements (highest score from questions above): None, normal Incapacitation due to abnormal movements: None, normal Patient's awareness of abnormal movements (rate only patient's report): No Awareness, Dental Status Current problems with teeth and/or dentures?: No Does patient usually  wear dentures?: No  CIWA:  CIWA-Ar Total: 1 COWS:  COWS Total Score: 1  Treatment Plan Summary: Daily contact with patient to assess and evaluate symptoms and progress in treatment Medication management  Plan: Supportive approach/coping skills/relapse prevention           CBT identify and address the distortions            Facilitate finding appropriate therapeutic placement Medical Decision Making Problem Points:  Review of psycho-social stressors (1) Data Points:  Review of medication regiment & side effects (2)  I certify that inpatient services furnished can reasonably be expected to improve the patient's condition.   [name] 03/07/2013, 5:05 PM

## 2013-03-07 NOTE — Progress Notes (Signed)
D: Patient denies HI and A/V hallucinations and admits to some on and off thoughts of SI; patient reports sleep is fair; reports appetite to be improving ; reports energy level is low ; reports ability to pay attention is improving; rates depression as 7/10; rates hopelessness 7/10; rates anxiety as -/10;   A: Monitored q 15 minutes; patient encouraged to attend groups; patient educated about medications; patient given medications per physician orders; patient encouraged to express feelings and/or concerns  R: Patient is animated with her peers but when in presence of staff she is flat and appears sad; patient's interaction with staff and peers can be minimal and isolative at times but observed today in group and laughing and joking with peers during free time; patient was able to set goal to talk with staff 1:1 when having feelings of SI; patient is taking medications as prescribed and tolerating medications; patient is attending some groups

## 2013-03-07 NOTE — Progress Notes (Signed)
[facility] LCSW Aftercare Discharge Planning Group Note  03/07/2013 8:85M  Participation Quality:  Attentive and Sharing  Affect:  Anxious  Cognitive:  Alert and Oriented  Insight:  Limited  Engagement in Group:  De Limited  Modes of Intervention:  Clarification, Exploration, Rapport Building and Support  Summary of Progress/Problems:  Pt reports suicidal is off and on; denies homicidal ideation.  On a scale of 1 to 10 with ten being the most ever experienced, the patient rates depression at a 7 and anxiety at a 7. Patient reports she will follow up at [facility] as "I am going to quit this time."  Patient open to discussing option of going to [facility] inpatient treatment.    [name]

## 2013-03-07 NOTE — Progress Notes (Deleted)
[facility] LCSW Group Therapy  03/07/2013 1:15 PM  Type of Therapy:  Group Therapy 1:15 to 2:30 PM  Participation Level:  Did Not Attend   [name]

## 2013-03-07 NOTE — Progress Notes (Signed)
[facility] LCSW Group Therapy  03/07/2013 1:15 PM  Type of Therapy:  Group Therapy from 1:15 to 2:30 PM  Participation Level:  Limited  Participation Quality:  Appropriate  Affect:  Appropriate  Cognitive:  Alert and Oriented  Insight:  Developing/Improving  Engagement in Therapy:  Limited  Modes of Intervention:  Discussion, Education and Support  Summary of Progress/Problems: Group session included discussion on feelings about relapse and an educational portion on Post Acute Withdrawal Syndrome (PAWS).  Patients were able to process their feelings about early recovery and the cycle of addiction. Tenise was focused on the fact that "I keep doing this over and over thinking it'll be different, but on some level I know it will not."   Harrill, Julious Payer

## 2013-03-08 MED ORDER — QUETIAPINE FUMARATE 400 MG PO TABS
400.0000 mg | ORAL_TABLET | Freq: Every day | ORAL | Status: DC
  Administered 2013-03-09 – 2013-03-11 (×3): 400 mg via ORAL
  Filled 2013-03-08 (×2): qty 1

## 2013-03-08 NOTE — Progress Notes (Signed)
Pt. Was irritable this pm.  She started complaining about her seroquel and stating it is in generic form and it does not work as good.  Pt. Continued complaining until she realized she was getting the seroquel and a trazodone for sleep.  Pt.is very labile.  She went from complaining and almost crying to laughing and telling her peers how good she was going to sleep tonight.  No complaints of pain or discomfort noted.  Pt. Contracts for safety, but still reports some passive SI and says she is not ready for discharge yet.  Then says she is hoping to go to a longterm treatment.

## 2013-03-08 NOTE — Progress Notes (Signed)
The patient attended the A. A. Meeting this evening.  

## 2013-03-08 NOTE — Progress Notes (Signed)
Adult Psychoeducational Group Note  Date:  03/08/2013 Time:  9:20 AM  Group Topic/Focus:  Self-Inventory  Participation Level:  Active  Participation Quality:  Appropriate, Attentive  Affect:  Anxious  Cognitive:  Alert and Appropriate  Insight: Improving  Engagement in Group:  Engaged  Modes of Intervention:  Clarification, Discussion, Exploration, Orientation, Problem-solving, Rapport Building and Support  Additional Comments:  Pt was attentive in group, seemed anxious and shared about concerns with some of her medication, assured pt that concerns would be explored with MD and encouraged pt to also express concerns with MD when pt is seen.  Alfonse Spruce 03/08/2013, 9:20 AM

## 2013-03-08 NOTE — Progress Notes (Signed)
Adult Psychoeducational Group Note  Date:  03/08/2013 Time:  1315  Group Topic/Focus:  Healthy Coping Skills  Participation Level:  Active  Participation Quality:  Appropriate  Affect:  Depressed  Cognitive:  Alert and Oriented  Insight: Good  Engagement in Group:  Engaged  Modes of Intervention:  Education and Exploration  Additional Comments:  Pt identified with topic and was receptive to materials presented.  Lynsi Dooner Shari Prows 03/08/2013, 2:07 PM

## 2013-03-08 NOTE — Progress Notes (Signed)
Adult Psychoeducational Group Note  Date:  03/08/2013 Time:  6:55 PM  Group Topic/Focus:  Identifying Needs:   The focus of this group is to help patients identify their personal needs that have been historically problematic and identify healthy behaviors to address their needs.  Participation Level:  Minimal  Participation Quality:  Appropriate and Attentive  Affect:  Appropriate  Cognitive:  Alert and Appropriate  Insight: Appropriate and Good  Engagement in Group:  Improving  Modes of Intervention:  Activity and Education  Additional Comments:  Pt was asked to write down healthy and unhealthy coping skills and share with group, later discussed.  Dalia Heading 03/08/2013, 6:55 PM

## 2013-03-08 NOTE — Progress Notes (Signed)
Patient did not attend the evening speaker AA meeting. Pt returned to her room where she stayed until group was over.

## 2013-03-08 NOTE — Progress Notes (Signed)
D:  Per pt self inventory pt reports sleeping fair, appetite improving, energy level low, ability to pay attention improving, rates depression at 7 out of 10 and hopelessness at a 7 out of 10, Passive SI on and off, contracts for safety, pt concerned with her Seroquel being generic, states that "the generic Seroquel does not work for me, they have had to up my dose to 500 mg at night and it is still not working".   A:  Emotional support provided, Encouraged pt to continue with treatment plan and attend all group activities, q15 min checks maintained for safety, MD notified of medication concerns.  R:  Pt is cooperative and pleasant towards staff, experiencing some anxiety about her meds but was appreciative of nursing staff for bringing her concern to the attention of the MD, MD is to talk with the pt about this.

## 2013-03-08 NOTE — Clinical Social Work Note (Signed)
[facility] Group Notes:  (Clinical Social Work)  03/08/2013     10-11AM  Summary of Progress/Problems:   The main focus of today's process group was for the patient to identify ways in which they have in the past sabotaged their own recovery. Motivational Interviewing was utilized to ask the group members what they get out of their substance use, and what they want to change.  The Stages of Change were explained, and members identified where they currently are with regard to stages of change.  The patient expressed that she recently self-sabotaged a period of sobriety by walking through the old neighborhood.  She added that she also has self-sabotaged by thinking she could have just one hit of crack.  She talked about the circle she had repeatedly gone through.  She does this to escape reality.  She feels she is in the preparation stage of change, has already made the decision she is not going back to that lifestyle.  Type of Therapy:  Group Therapy - Process   Participation Level:  Active  Participation Quality:  Appropriate, Attentive, Sharing and Supportive  Affect:  Blunted  Cognitive:  Alert, Appropriate and Oriented  Insight:  Engaged  Engagement in Therapy:  Engaged  Modes of Intervention:  Education, Teacher, English as a foreign language, Exploration, Discussion, Motivational Interviewing   Stephanie Mantle, LCSW 03/08/2013, 11:50 AM

## 2013-03-08 NOTE — Progress Notes (Addendum)
D.  Pt bright and pleasant on approach.  Pt stated that she did not want her Seroquel increased to 500 mg, she just wanted the non-generic brand to be given.  Pt states that this works for her, while the other does not.  Pt denies SI/HI/hallucinations at this time.  Pt did not attend evening group, interacting appropriately on unit.  A.  Dr. Geryl Rankins consulted on the subject of Pt's Seroquel and order received to either dispense as written or allow Pt to use home medication which is available in her locker.  R.  Pt very pleased by this, states that she will be able to sleep tonight.  Will continue to monitor.

## 2013-03-08 NOTE — Progress Notes (Addendum)
Patient ID: [name], female   DOB: December 14, 1961, 52 y.o.   MRN: [id number] [facility] MD Progress Note  03/08/2013 12:15 PM Karri Kallenbach  MRN:  [id number]  Subjective:  Davetta is endorsing some regrets over her drug use habits. Is saying today that she has made up her mind to not use any longer because she is about to turn 52. She added that she has not been a good mother to her children because of her drug use. She says that she does not want to live this life any more. Is having some suicidal thoughts off and on, but no plans and or intent to hurt self here".  Diagnosis:  Cocaine dependence, Mood Disorder NOS  ADL's:  Intact  Sleep: "I slept some better with combination of Seroquel and Trazodone"  Appetite:  Fair  Suicidal Ideation:  Plan:  ideas, no plans Intent:  denies Means:  denies Homicidal Ideation:  Plan:  denies  AEB (as evidenced by): Per patient's reports.  Psychiatric Specialty Exam: Review of Systems  Constitutional: Negative.   HENT: Negative.   Eyes: Negative.   Respiratory: Negative.   Cardiovascular: Negative.   Gastrointestinal: Negative.   Genitourinary: Negative.   Musculoskeletal: Negative.   Skin: Negative.   Neurological: Negative.   Endo/Heme/Allergies: Negative.   Psychiatric/Behavioral: Positive for depression, suicidal ideas and substance abuse. The patient is nervous/anxious and has insomnia.     Blood pressure 119/73, pulse 101, temperature 98.4 F (36.9 C), temperature source Oral, resp. rate 18, height 5' (1.524 m), weight 52.844 kg (116 lb 8 oz).Body mass index is 22.75 kg/(m^2).  General Appearance: Disheveled  Eye Solicitor::  Fair  Speech:  Clear and Coherent and rapid  Volume:  Increased  Mood:  Anxious, Depressed and worried, at times hopeless  Affect:  anxious  Thought Process:  Coherent and Goal Directed  Orientation:  Full (Time, Place, and Person)  Thought Content:  Rumination and worries and concerns  Suicidal Thoughts:   Yes.  without intent/plan  Homicidal Thoughts:  No  Memory:  Immediate;   Fair Recent;   Fair Remote;   Fair  Judgement:  Fair  Insight:  superficial  Psychomotor Activity:  Restlessness  Concentration:  Fair  Recall:  Fair  Akathisia:  No  Handed:  Right  AIMS (if indicated):     Assets:  Desire for Improvement  Sleep:  Number of Hours: 6.75   Current Medications: Current Facility-Administered Medications  Medication Dose Route Frequency Provider Last Rate Last Dose  . acetaminophen (TYLENOL) tablet 650 mg  650 mg Oral Q6H PRN Mickeal Skinner, MD      . alum & mag hydroxide-simeth (MAALOX/MYLANTA) 200-200-20 MG/5ML suspension 30 mL  30 mL Oral Q4H PRN Mickeal Skinner, MD      . feeding supplement (ENSURE COMPLETE) liquid 237 mL  237 mL Oral BID BM Rachael Fee, MD   237 mL at 03/05/13 2231  . magnesium hydroxide (MILK OF MAGNESIA) suspension 30 mL  30 mL Oral Daily PRN Mickeal Skinner, MD      . multivitamin with minerals tablet 1 tablet  1 tablet Oral Daily Rachael Fee, MD   1 tablet at 03/08/13 0825  . QUEtiapine (SEROQUEL) tablet 50 mg  50 mg Oral BID Rachael Fee, MD   50 mg at 03/08/13 5784  . QUEtiapine (SEROQUEL) tablet 500 mg  500 mg Oral QHS Sanjuana Kava, NP   500 mg at 03/07/13 2110  . traZODone (DESYREL) tablet 50 mg  50 mg Oral QHS PRN Mickeal Skinner, MD   50 mg at 03/07/13 2110    Lab Results:    Physical Findings: AIMS: Facial and Oral Movements Muscles of Facial Expression: None, normal Lips and Perioral Area: None, normal Jaw: None, normal Tongue: None, normal,Extremity Movements Upper (arms, wrists, hands, fingers): None, normal Lower (legs, knees, ankles, toes): None, normal, Trunk Movements Neck, shoulders, hips: None, normal, Overall Severity Severity of abnormal movements (highest score from questions above): None, normal Incapacitation due to abnormal movements: None, normal Patient's awareness of abnormal movements (rate only patient's report): No  Awareness, Dental Status Current problems with teeth and/or dentures?: No Does patient usually wear dentures?: No  CIWA:  CIWA-Ar Total: 1 COWS:  COWS Total Score: 1  Treatment Plan Summary: Daily contact with patient to assess and evaluate symptoms and progress in treatment Medication management  Plan: Supportive approach/coping skills/relapse prevention           CBT identify and address the distortions            Facilitate finding appropriate therapeutic placement  Medical Decision Making Problem Points:  Review of psycho-social stressors (1) Data Points:  Review of medication regiment & side effects (2)  I certify that inpatient services furnished can reasonably be expected to improve the patient's condition.   Sanjuana Kava, PMHNP 03/08/2013, 12:15 PM  Discussed with provider and agree with above. Jacqulyn Cane, M.D.  03/08/2013 10:58 PM

## 2013-03-08 NOTE — Progress Notes (Signed)
Late Entry:This Clinical research associate heard pt stating "I will stay here as long as I can and If you just rate everything a 7 they will let you stay. That's how you work the system here.  Believe me that's how it works." Pt continued this discussion with two other pts during dinner in cafeteria yesterday Friday, March 14th 5:30pm.
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
## 2013-03-09 NOTE — Clinical Social Work Note (Signed)
[facility] Group Notes: (Clinical Social Work)   03/09/2013      Type of Therapy:  Group Therapy   Participation Level:  Did Not Attend    Ambrose Mantle, LCSW 03/09/2013, 12:03 PM

## 2013-03-09 NOTE — Progress Notes (Signed)
[facility] Group Notes:  (Nursing/MHT/Case Management/Adjunct)  Date:  03/09/2013  Time:  10:54 AM  Type of Therapy:  Psychoeducational Skills  Participation Level:  Did Not Attend  Dalia Heading 03/09/2013, 10:54 AM

## 2013-03-09 NOTE — Progress Notes (Signed)
Patient did not attend the evening speaker AA meeting. Pt remained in her room during meeting reporting "I don't go to AA".

## 2013-03-09 NOTE — Progress Notes (Signed)
D.  Pt bright and pleasant on approach.  Pt denies complaints at this time, interacting appropriately within milieu.  Pt did not attend evening AA group.  Pt denies SI/HI/hallucinations at this time.  Pt states that her Seroquel continues to work well for her at nighttime.  A.  Support and encouragement offered.  R.  Will continue to monitor.

## 2013-03-09 NOTE — Progress Notes (Signed)
D   Pt requested to take her brand name Seroquel 400 mg at hs from her med supply and take the generic 50mg  from [facility] phm  Twice daily   She does not attend groups and has limited interaction with others   She spends a lot of time isolating in her room A   Verbal support given  Medications administered and effectiveness monitored  Q 15 min checks R   Pt safe at present

## 2013-03-09 NOTE — Progress Notes (Addendum)
Patient ID: [name], female   DOB: 1961-01-29, 52 y.o.   MRN: [id number] Patient ID: [name], female   DOB: [birth year], 52 y.o.   MRN: [id number] [facility] MD Progress Note  03/09/2013 1:25 PM [name]  MRN:  [id number]  Subjective: "I slept well last night. I feel very happy and hopeful today. I'm determined to make a change in my life. I don't want to continue drug use any more. I have a 51 year old son who is living in [place] with his grand mother. I stay away from him because of my drug use. I'm ready to be a mom again to my children".   Diagnosis:  Cocaine dependence, Mood Disorder NOS  ADL's:  Intact  Sleep: "I slept some better with combination of Seroquel and Trazodone"  Appetite:  Fair  Suicidal Ideation: "Some thoughts" Plan:  ideas, no plans Intent:  denies Means:  denies Homicidal Ideation:  Plan:  denies  AEB (as evidenced by): Per patient's reports.  Psychiatric Specialty Exam: Review of Systems  Constitutional: Negative.   HENT: Negative.   Eyes: Negative.   Respiratory: Negative.   Cardiovascular: Negative.   Gastrointestinal: Negative.   Genitourinary: Negative.   Musculoskeletal: Negative.   Skin: Negative.   Neurological: Negative.   Endo/Heme/Allergies: Negative.   Psychiatric/Behavioral: Positive for depression, suicidal ideas and substance abuse. The patient is nervous/anxious and has insomnia.     Blood pressure 115/75, pulse 93, temperature 98.5 F (36.9 C), temperature source Oral, resp. rate 16, height 5' (1.524 m), weight 52.844 kg (116 lb 8 oz).Body mass index is 22.75 kg/(m^2).  General Appearance: Casual  Eye Contact::  Fair  Speech:  Clear and Coherent and rapid  Volume:  Normal  Mood:  "I feel happy and hopeful today"  Affect:  anxious  Thought Process:  Coherent and Goal Directed  Orientation:  Full (Time, Place, and Person)  Thought Content:  Rumination  Suicidal Thoughts:  Yes.  without intent/plan  Homicidal  Thoughts:  No  Memory:  Immediate;   Fair Recent;   Fair Remote;   Fair  Judgement:  Fair  Insight:  superficial  Psychomotor Activity:  Calm  Concentration:  Fair  Recall:  Fair  Akathisia:  No  Handed:  Right  AIMS (if indicated):     Assets:  Desire for Improvement  Sleep:  Number of Hours: 5.5   Current Medications: Current Facility-Administered Medications  Medication Dose Route Frequency Provider Last Rate Last Dose  . acetaminophen (TYLENOL) tablet 650 mg  650 mg Oral Q6H PRN Mickeal Skinner, MD      . alum & mag hydroxide-simeth (MAALOX/MYLANTA) 200-200-20 MG/5ML suspension 30 mL  30 mL Oral Q4H PRN Mickeal Skinner, MD      . magnesium hydroxide (MILK OF MAGNESIA) suspension 30 mL  30 mL Oral Daily PRN Mickeal Skinner, MD      . multivitamin with minerals tablet 1 tablet  1 tablet Oral Daily Rachael Fee, MD   1 tablet at 03/09/13 0818  . QUEtiapine (SEROQUEL) tablet 400 mg  400 mg Oral QHS Larena Sox, MD      . QUEtiapine (SEROQUEL) tablet 50 mg  50 mg Oral BID Larena Sox, MD   50 mg at 03/09/13 0819  . traZODone (DESYREL) tablet 50 mg  50 mg Oral QHS PRN Mickeal Skinner, MD   50 mg at 03/08/13 2219    Lab Results:    Physical Findings: AIMS: Facial and Oral Movements Muscles of  Facial Expression: None, normal Lips and Perioral Area: None, normal Jaw: None, normal Tongue: None, normal,Extremity Movements Upper (arms, wrists, hands, fingers): None, normal Lower (legs, knees, ankles, toes): None, normal, Trunk Movements Neck, shoulders, hips: None, normal, Overall Severity Severity of abnormal movements (highest score from questions above): None, normal Incapacitation due to abnormal movements: None, normal Patient's awareness of abnormal movements (rate only patient's report): No Awareness, Dental Status Current problems with teeth and/or dentures?: No Does patient usually wear dentures?: No  CIWA:  CIWA-Ar Total: 1 COWS:  COWS Total Score: 1  Treatment  Plan Summary: Daily contact with patient to assess and evaluate symptoms and progress in treatment Medication management  Plan: Supportive approach/coping skills/relapse prevention CBT identify and address the distortions Facilitate finding appropriate therapeutic placement Continue current treatment plan.  Medical Decision Making Problem Points:  Review of psycho-social stressors (1) Data Points:  Review of medication regiment & side effects (2)  I certify that inpatient services furnished can reasonably be expected to improve the patient's condition.   Sanjuana Kava, PMHNP 03/09/2013, 1:25 PM  Discussed with provider and agree with above.  Jacqulyn Cane, M.D.  03/09/2013 5:28 PM

## 2013-03-09 NOTE — Progress Notes (Signed)
Adult Psychoeducational Group Note  Date:  03/09/2013 Time:  1300  Group Topic/Focus:  Healthy Support Systems  Participation Level:  Active  Participation Quality:  Appropriate and Attentive  Affect:  Appropriate  Cognitive:  Alert and Appropriate  Insight: Improving  Engagement in Group:  Engaged  Modes of Intervention:  Discussion and Education  Additional Comments:  Pt was interested and involved in the group.  [name] Shari Prows 03/09/2013, 1:38 PM

## 2013-03-10 MED ORDER — FLUOXETINE HCL 10 MG PO CAPS
10.0000 mg | ORAL_CAPSULE | Freq: Every day | ORAL | Status: DC
  Administered 2013-03-10 – 2013-03-12 (×3): 10 mg via ORAL
  Filled 2013-03-10 (×3): qty 1
  Filled 2013-03-10: qty 4
  Filled 2013-03-10: qty 1

## 2013-03-10 NOTE — Progress Notes (Signed)
Patient ID: [name], female   DOB: 03-02-1961, 52 y.o.   MRN: [id number] D: pt. In day room interacting with other clients, laughing, talking. Pt. Reports  "had a good day today, I been up". "I got sad, told them I'm getting sober now, so I'm getting sad I need something" "they gave me Prozac" "I'm going to be [age] next month, I'm tired" "they gave me a couple more days, making sure everything working safe" Paper reports where she lives no contact with people she used with. "I live about 45 minutes away from them and they can't come where I'm at." A: Pt. Encouraged to continue to focus on positive life style and follow through with supportive groups. Pt. Encouraged to continue groups. Pt. Will be monitored q35min for safety. R: Pt. Is safe on the unit. Pt. Attends group.

## 2013-03-10 NOTE — Progress Notes (Signed)
[facility] LCSW Aftercare Discharge Planning Group Note  03/10/2013 8:45 PM  Participation Quality:  Appropriate  Affect:  Anxious  Cognitive:  Alert and Oriented  Insight:  Developing/Improving  Engagement in Group:  Developing/Improving  Modes of Intervention:  Clarification, Exploration, Reality Testing and Support  Summary of Progress/Problems:  Pt denies both suicidal and homicidal ideation.  On a scale of 1 to 10 with ten being the most ever experienced, the patient rates depression at a 5 and anxiety at an 8. [name] reports she is getting anxious about leaving this time, I usually just get the "I gots to go" and leave and get high; this time I want to stay clean so I am scared about leaving. CSW will contact area facilities to locate bed   [name] 03/10/2013, 5:12 PM

## 2013-03-10 NOTE — Tx Team (Signed)
Interdisciplinary Treatment Plan Update (Adult)  Date: 03/10/2013  Time Reviewed: 9:41 AM   Progress in Treatment: Attending groups: Yes Participating in groups: Yes Taking medication as prescribed:  Yes Tolerating medication:  Yes Family/Significant othe contact made: Yes Patient understands diagnosis: Yes Discussing patient identified problems/goals with staff: Yes Medical problems stabilized or resolved:  Yes Denies suicidal/homicidal ideation: Yes Patient has not harmed self or Others: Yes  New problem(s) identified: None Identified  Discharge Plan or Barriers:  CSW is assessing for appropriate referrals.   Additional comments: N/A  Reason for Continuation of Hospitalization Anxiety Depression Medication stabilization   Estimated length of stay: 3 days  For review of initial/current patient goals, please see plan of care.  Attendees: Patient:     Family:     Physician:  Geoffery Lyons 03/10/2013 9:55 AM   Nursing:   Roswell Miners, RN 03/10/2013 9:55 AM   Clinical Social Worker Ronda Fairly 03/10/2013 9:55 AM   Other:  Robbie Louis, RN 03/10/2013 9:55 AM   Other:   Liliane Bade, Transitional Care Coordinator 03/10/2013 9:55 AM   Other:  Leandra Kern, Sherrie Sport PA Student 03/10/2013 9:55 AM   Other:   03/10/2013 9:55 AM    Scribe for Treatment Team:   [name], LCSWA  03/10/2013 9:55 AM
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
## 2013-03-10 NOTE — Progress Notes (Signed)
Adult Psychoeducational Group Note  Date:  03/10/2013 Time:  3:54 PM  Group Topic/Focus:  Self Care:   The focus of this group is to help patients understand the importance of self-care in order to improve or restore emotional, physical, spiritual, interpersonal, and financial health.  Participation Level:  Minimal  Participation Quality:  Inattentive  Affect:  Flat  Cognitive:  Appropriate  Insight: None  Engagement in Group:  None  Modes of Intervention:  Education  Additional Comments:  Pt left shortly after group began. Before pt left, staff and other pts discussed the multidimensional nature of the "self," as well as the meaning of self-care. Pt did not participate in this discussion. Pt left as group was being directed by staff to complete self-care inventory.  Reinaldo Raddle K 03/10/2013, 3:54 PM

## 2013-03-10 NOTE — Plan of Care (Signed)
Problem: Ineffective individual coping Goal: STG: Patient will participate in after care plan Outcome: Progressing Patient had turned down follow up care until today and is requesting referral to an inpatient program  Problem: Alteration in mood & ability to function due to Goal: LTG-Patient demonstrates decreased signs of withdrawal (Patient demonstrates decreased signs of withdrawal to the point the patient is safe to return home and continue treatment in an outpatient setting)  Outcome: Progressing Patient is showing decreased signs of withdrawal CIWA is 1.  Patient reports current high level of anxiety is something she often self medicates with alcohol and or drugs.

## 2013-03-10 NOTE — Progress Notes (Signed)
[facility] MD Progress Note  03/10/2013 12:43 PM [name]  MRN:  [id number] Subjective:  [name] is having a hard time. She is trying not to listen to the voice in her head telling her to get out, to use. She is tearful. Endorses she feels very depressed, scared. She is concerned about relapsing. Endorses that she does not want to go back to the same roller coaster. She endorses intermittent suicidal ideas. Endorses that if she was to relapse it would be the end of her. Would rather kill herself.  Diagnosis:  Cocaine Dependence, Mood Disorder NOS  ADL's:  Intact  Sleep: Poor  Appetite:  Fair  Suicidal Ideation:  Plan:  inermittent ideas Intent:  denies Means:  denies Homicidal Ideation:  Plan:  denies Intent:  denies Means:  denies AEB (as evidenced by):  Psychiatric Specialty Exam: Review of Systems  Constitutional: Negative.   HENT: Negative.   Eyes: Negative.   Respiratory: Negative.   Cardiovascular: Negative.   Gastrointestinal: Negative.   Genitourinary: Negative.   Musculoskeletal: Negative.   Skin: Negative.   Neurological: Negative.   Endo/Heme/Allergies: Negative.   Psychiatric/Behavioral: Positive for depression, suicidal ideas and substance abuse. The patient is nervous/anxious.     Blood pressure 119/79, pulse 82, temperature 98.2 F (36.8 C), temperature source Oral, resp. rate 20, height 5' (1.524 m), weight 52.844 kg (116 lb 8 oz).Body mass index is 22.75 kg/(m^2).  General Appearance: Fairly Groomed  Patent attorney::  Fair  Speech:  Clear and Coherent and Slow  Volume:  Decreased  Mood:  Depressed and worried, fearful, overwhelmed  Affect:  Tearful  Thought Process:  Coherent and Goal Directed  Orientation:  Full (Time, Place, and Person)  Thought Content:  worries, concerns  Suicidal Thoughts:  Yes.  without intent/plan  Homicidal Thoughts:  No  Memory:  Immediate;   Fair Recent;   Fair Remote;   Fair  Judgement:  Fair  Insight:  Present   Psychomotor Activity:  Restlessness  Concentration:  Fair  Recall:  Fair  Akathisia:  No  Handed:  Right  AIMS (if indicated):     Assets:  Desire for Improvement  Sleep:  Number of Hours: 5.75   Current Medications: Current Facility-Administered Medications  Medication Dose Route Frequency Provider Last Rate Last Dose  . acetaminophen (TYLENOL) tablet 650 mg  650 mg Oral Q6H PRN Mickeal Skinner, MD      . alum & mag hydroxide-simeth (MAALOX/MYLANTA) 200-200-20 MG/5ML suspension 30 mL  30 mL Oral Q4H PRN Mickeal Skinner, MD      . FLUoxetine (PROZAC) capsule 10 mg  10 mg Oral Daily Rachael Fee, MD      . magnesium hydroxide (MILK OF MAGNESIA) suspension 30 mL  30 mL Oral Daily PRN Mickeal Skinner, MD      . multivitamin with minerals tablet 1 tablet  1 tablet Oral Daily Rachael Fee, MD   1 tablet at 03/10/13 0750  . QUEtiapine (SEROQUEL) tablet 400 mg  400 mg Oral QHS Larena Sox, MD   400 mg at 03/09/13 2139  . QUEtiapine (SEROQUEL) tablet 50 mg  50 mg Oral BID Larena Sox, MD   50 mg at 03/10/13 0750  . traZODone (DESYREL) tablet 50 mg  50 mg Oral QHS PRN Mickeal Skinner, MD   50 mg at 03/08/13 2219    Lab Results: No results found for this or any previous visit (from the past 48 hour(s)).  Physical Findings: AIMS: Facial and Oral  Movements Muscles of Facial Expression: None, normal Lips and Perioral Area: None, normal Jaw: None, normal Tongue: None, normal,Extremity Movements Upper (arms, wrists, hands, fingers): None, normal Lower (legs, knees, ankles, toes): None, normal, Trunk Movements Neck, shoulders, hips: None, normal, Overall Severity Severity of abnormal movements (highest score from questions above): None, normal Incapacitation due to abnormal movements: None, normal Patient's awareness of abnormal movements (rate only patient's report): No Awareness, Dental Status Current problems with teeth and/or dentures?: No Does patient usually wear dentures?: No   CIWA:  CIWA-Ar Total: 1 COWS:  COWS Total Score: 1  Treatment Plan Summary: Daily contact with patient to assess and evaluate symptoms and progress in treatment Medication management  Plan: Supportive approach/coping skills/relapse prevention           States that Prozac helped her in the past, would like to go back on it           Still thinks that the Brand name Seroquel helped more than the generic           Will explore drug rehab placements Medical Decision Making Problem Points:  Review of psycho-social stressors (1) Data Points:  Review of medication regiment & side effects (2) Review of new medications or change in dosage (2)  I certify that inpatient services furnished can reasonably be expected to improve the patient's condition.   [name] A 03/10/2013, 12:43 PM

## 2013-03-11 NOTE — Progress Notes (Signed)
Patient did not attend group.

## 2013-03-11 NOTE — Progress Notes (Signed)
[name] Progress Note  03/11/2013 5:28 PM [name]  MRN:  [id number] Subjective:  [name] endorses that she still thinks the Seroquel brand name works better than the generic. She stated that she is committed to abstinence this time around. Stated that last time she was here she was already planning to go to the "crack house" and get high by now. She has a bed at [facility] rehab. She wants to go "home" as she found out her daughter came down from [place] looking for her. She has not been able to locate her and had been waiting at the house for a phone call. She is excited that her daughter is here. Wants to spend time with her Diagnosis:  Cocaine Dependence, Mood Disorder unspecified  ADL's:  Intact  Sleep: Poor  Appetite:  Fair  Suicidal Ideation:  Plan:  denies Intent:  denies Means:  denies Homicidal Ideation:  Plan:  denies Intent:  denies Means:  denies AEB (as evidenced by):  Psychiatric Specialty Exam: Review of Systems  Constitutional: Negative.   HENT: Negative.   Eyes: Negative.   Respiratory: Negative.   Cardiovascular: Negative.   Gastrointestinal: Negative.   Genitourinary: Negative.   Musculoskeletal: Negative.   Skin: Negative.   Neurological: Negative.   Endo/Heme/Allergies: Negative.   Psychiatric/Behavioral: Positive for substance abuse. The patient is nervous/anxious.     Blood pressure 110/70, pulse 73, temperature 98.1 F (36.7 C), temperature source Oral, resp. rate 16, height 5' (1.524 m), weight 52.844 kg (116 lb 8 oz).Body mass index is 22.75 kg/(m^2).  General Appearance: Fairly Groomed  Patent attorney::  Fair  Speech:  Clear and Coherent and rapid  Volume:  Normal  Mood:  Anxious and worried  Affect:  anxious  Thought Process:  Coherent and Goal Directed  Orientation:  Full (Time, Place, and Person)  Thought Content:  worries, concerns  Suicidal Thoughts:  No  Homicidal Thoughts:  No  Memory:  Immediate;   Fair Recent;   Fair Remote;    Fair  Judgement:  Fair  Insight:  Shallow  Psychomotor Activity:  Restlessness  Concentration:  Fair  Recall:  Fair  Akathisia:  No  Handed:  Right  AIMS (if indicated):     Assets:  Desire for Improvement  Sleep:  Number of Hours: 6   Current Medications: Current Facility-Administered Medications  Medication Dose Route Frequency Provider Last Rate Last Dose  . acetaminophen (TYLENOL) tablet 650 mg  650 mg Oral Q6H PRN Mickeal Skinner, MD   650 mg at 03/11/13 1551  . alum & mag hydroxide-simeth (MAALOX/MYLANTA) 200-200-20 MG/5ML suspension 30 mL  30 mL Oral Q4H PRN Mickeal Skinner, MD      . FLUoxetine (PROZAC) capsule 10 mg  10 mg Oral Daily Rachael Fee, MD   10 mg at 03/11/13 0758  . magnesium hydroxide (MILK OF MAGNESIA) suspension 30 mL  30 mL Oral Daily PRN Mickeal Skinner, MD      . multivitamin with minerals tablet 1 tablet  1 tablet Oral Daily Rachael Fee, MD   1 tablet at 03/11/13 0759  . QUEtiapine (SEROQUEL) tablet 400 mg  400 mg Oral QHS Larena Sox, MD   400 mg at 03/10/13 2121  . QUEtiapine (SEROQUEL) tablet 50 mg  50 mg Oral BID Larena Sox, MD   50 mg at 03/11/13 1722  . traZODone (DESYREL) tablet 50 mg  50 mg Oral QHS PRN Mickeal Skinner, MD   50 mg at 03/10/13 2120  Lab Results: No results found for this or any previous visit (from the past 48 hour(s)).  Physical Findings: AIMS: Facial and Oral Movements Muscles of Facial Expression: None, normal Lips and Perioral Area: None, normal Jaw: None, normal Tongue: None, normal,Extremity Movements Upper (arms, wrists, hands, fingers): None, normal Lower (legs, knees, ankles, toes): None, normal, Trunk Movements Neck, shoulders, hips: None, normal, Overall Severity Severity of abnormal movements (highest score from questions above): None, normal Incapacitation due to abnormal movements: None, normal Patient's awareness of abnormal movements (rate only patient's report): No Awareness, Dental Status Current  problems with teeth and/or dentures?: Yes (missing front/lateral teeth) Does patient usually wear dentures?: No  CIWA:  CIWA-Ar Total: 1 COWS:  COWS Total Score: 1  Treatment Plan Summary: Daily contact with patient to assess and evaluate symptoms and progress in treatment Medication management  Plan: Supportive approach/coping skills/relapse prevention           Help come up with a plan of how to stay abstinent until she gets to go into rehab Medical Decision Making Problem Points:  Review of psycho-social stressors (1) Data Points:  Review of medication regiment & side effects (2)  I certify that inpatient services furnished can reasonably be expected to improve the patient's condition.   Kou Gucciardo A 03/11/2013, 5:28 PM

## 2013-03-11 NOTE — Progress Notes (Signed)
[facility] LCSW Aftercare Discharge Planning Group Note  03/11/2013 11:00 AM  Participation Quality:  Attentive and Sharing  Affect:  Flat  Cognitive:  Alert, Appropriate and Oriented  Insight:  Developing/Improving  Engagement in Group:  Limited  Modes of Intervention:  Clarification, Exploration, Reality Testing and Support  Summary of Progress/Problems:  Pt denies both suicidal and homicidal ideation.  On a scale of 1 to 10 with ten being the most ever experienced, the patient rates depression at a 6 and anxiety at a 7.  Patient reports she still hears voices yet this is not unusual for her, neither is depression and anxiety.  "I usually want to self medicate but this time I just want to stay sober."     [name] 03/11/2013, 12:59 PM

## 2013-03-11 NOTE — Progress Notes (Signed)
Recreation Therapy Notes  Date: 03.18.2014  Time: 3:00pm  Location: [facility] Art Room   Group Topic/Focus: Goal Setting   Participation Level:  Did not attend  Jearl Klinefelter, LRT/CTRS  Jearl Klinefelter 03/11/2013 3:48 PM

## 2013-03-11 NOTE — Progress Notes (Signed)
[facility] LCSW Group Therapy  03/11/2013 1:15 PM  Type of Therapy:  Group Therapy from 1:15 to 2:30 PM  Participation Level:  Minimal  Participation Quality:  Drowsy  Affect:  Lethargic  Cognitive:  Oriented   Insight:  Limited  Engagement in Therapy:  Lacking  Modes of Intervention:  Clarification, Discussion, Socialization and Support  Summary of Progress/Problems: Warmup portion of group allowed patient's to choose from multiple emotions as to how they are feeling today. Emotions listed ranged from anger, denial, loneliness, hopeful, guilty, shamed, happiness, guilt and hopelessness. Patient were then asked if those same emotions might transfer into how they feel about their diagnosis. [name] shared she felt depressed and confused before nodding of to sleep.  This may have been due to medication changes as per self report.   [name]

## 2013-03-12 MED ORDER — QUETIAPINE FUMARATE 50 MG PO TABS: 50.0000 mg | ORAL_TABLET | Freq: Two times a day (BID) | ORAL | Status: DC

## 2013-03-12 MED ORDER — FLUOXETINE HCL 10 MG PO CAPS: 10.0000 mg | ORAL_CAPSULE | Freq: Every day | ORAL | Status: DC

## 2013-03-12 MED ORDER — QUETIAPINE FUMARATE 400 MG PO TABS: 400.0000 mg | ORAL_TABLET | Freq: Every day | ORAL | Status: DC

## 2013-03-12 MED ORDER — TRAZODONE HCL 50 MG PO TABS: 50.0000 mg | ORAL_TABLET | Freq: Every evening | ORAL | Status: DC | PRN

## 2013-03-12 MED ORDER — ADULT MULTIVITAMIN W/MINERALS CH: 1.0000 | ORAL_TABLET | Freq: Every day | ORAL | Status: DC

## 2013-03-12 NOTE — Progress Notes (Signed)
Patient ID: [name], female   DOB: [birth year], 53 y.o.   MRN: [id number]  D: Pt was smiling and in the dayroom interacting with her peers. Informed the writer that her daughter has moved back to [place] from [place].  Stated she's ready and "this time she's gonna stay sober".   A:  Support and encouragement was offered. 15 min checks continued for safety.  R: Pt remains safe.

## 2013-03-12 NOTE — Progress Notes (Signed)
[facility] Adult Case Management Discharge Plan :  Will you be returning to the same living situation after discharge: Yes,  with daughter until patient admits to [facility] on 4/2 At discharge, do you have transportation home?:Yes,  bus voucher Do you have the ability to pay for your medications:Yes,  good through [facility]  Release of information consent forms completed and in the chart;  Patient's signature needed at discharge.  Patient to Follow up at: Follow-up Information   Follow up with [facility]. (Please follow up with the walk in clinic at [facility] Monday - Friday 9am - 3 pm.)    Contact information:   [address] [place], [place] [address] Ph [phone] FAX [phone]      Follow up with [facility]  On 03/26/2013. (Be in [place] [facility] on Tuesday 03/26/13 at 8 AM)    Contact information:   [address] [place], [place] [address] [facility] [phone] FAX [phone]      Patient denies SI/HI:   Yes,  denits both    Safety Planning and Suicide Prevention discussed:  Yes,  with daughter  [name] 03/12/2013, 11:38 AM

## 2013-03-12 NOTE — Progress Notes (Signed)
[facility] INPATIENT:  Family/Significant Other Suicide Prevention Education  Suicide Prevention Education:  Education Completed; Patient's daughter Crist Infante, at [phone],  has been identified by the patient as the family member/significant other with whom the patient will be residing, and identified as the person(s) who will aid the patient in the event of a mental health crisis (suicidal ideations/suicide attempt).  With written consent from the patient, the family member/significant other has been provided the following suicide prevention education, prior to the and/or following the discharge of the patient.  The suicide prevention education provided includes the following:  Suicide risk factors  Suicide prevention and interventions  National Suicide Hotline telephone number  [facility] assessment telephone number  [facility] Emergency Assistance 911  [facility] and/or Residential Mobile Crisis Unit telephone number  Request made of family/significant other to:  Remove weapons (e.g., guns, rifles, knives), all items previously/currently identified as safety concern.    Remove drugs/medications (over-the-counter, prescriptions, illicit drugs), all items previously/currently identified as a safety concern.  The family member/significant other verbalizes understanding of the suicide prevention education information provided.  The family member/significant other agrees to remove the items of safety concern listed above.  [name] 03/12/2013, 11:35 AM

## 2013-03-12 NOTE — Progress Notes (Signed)
Pt was discharged home today.  Shedenied any S/I H/I or A/V hallucinations.    She was given f/u appointment, rx, sample medications, hotline info booklet, and a bus pass.  She voiced understanding to all instructions provided.  He declined the need for smoking cessation materials.  He removed his nicotine patch before he left.

## 2013-03-12 NOTE — Progress Notes (Deleted)
Patient ID: [name], female   DOB: 1961-11-15, 52 y.o.   MRN: [id number] In addition to follow up at [facility] for medication management patient was given schedule for NA in [place] and [place] areas along with requested information on the [facility] [address], [address] and [employer]. Patient had been referred to [facility] while here but no beds opened up and patient's options as [facility] Resident are limited for treatment referral.  [name] 03/12/2013 10:55 AM
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
## 2013-03-12 NOTE — Discharge Summary (Signed)
Physician Discharge Summary Note  Patient:  [name] is an 52 y.o., female MRN:  [id number] DOB:  25-Mar-1961 Patient phone:  [phone] (home)  Patient address:   [address] [place] [address],   Date of Admission:  03/02/2013  Date of Discharge: 03/12/13  Reason for Admission:  Cocaine abuse  Discharge Diagnoses: Active Problems:   * No active hospital problems. *  Review of Systems  Constitutional: Negative.   HENT: Negative.   Eyes: Negative.   Respiratory: Negative.   Cardiovascular: Negative.   Gastrointestinal: Negative.   Genitourinary: Negative.   Skin: Negative.   Neurological: Negative.   Endo/Heme/Allergies: Negative.   Psychiatric/Behavioral: Positive for depression (Stabilized with medication prior to discharge) and substance abuse (Hx Cocaine abuse). Negative for suicidal ideas, hallucinations and memory loss. The patient has insomnia (Stabilized with medication prior to discharge). The patient is not nervous/anxious.    Axis Diagnosis:   AXIS I:  Polysubstance abuse, Cocaine addiction,  AXIS II:  Deferred AXIS III:   Past Medical History  Diagnosis Date  . Depression   . Bipolar 1 disorder   . Drug abuse    AXIS IV:  Polysubstance absue AXIS V:  64  Level of Care:  OP  Hospital Course:  After she left last time she went to stay with a friend of her daughter.Marland Kitchen She went to a crack house the same day she left. She uses from Friday to Sunday. She also uses a "lttle alcohol, little weed." Her daughter is doing well in [employer]. She states she wants to stop using.. States there is alcohol at the house. She states that this time around she has to make it.  While a patient in this hospital this time around, [name] did not receive any detoxification treatment protocol. This is because she tested positive for cocaine in her UDS test. And because there is no designated and or estabilshed treatment protocol for cocaine, she was rather restarted on her  routine psychotropic medications for her mental illness.  [name] was prescribed and received Seroquel 400 mg for mood control, Seroquel 50 mg bid for anxiety symptoms, Fluoxetine 10 mg daily for depression and Trazodone 50 mg for sleep. She also was enrolled in group counseling sessions and activities to learn coping skills that should help her cope with her symptoms after discharge and on the long run manage her cocaine addiction for a much symptom control and longer sobriety. She also received medication management and monitoring for her other medical issues and concerns such as diflucan 150 mg for yeast infection etc. She tolerated her treatment regimen without any significant adverse effects and or reactions presented.   Patient did respond positively to her treatment regimen. This is evidenced by her daily reports of improved mood, reduction of symptoms and presentation of good affect. [name] just as when she was here last, made a promise to herself to quit drugs and focus her attention to her children. She added that she has a son 80 years old who is currently living in [place] with other relatives. [name] stated that that child belongs to her and should live with her. She believed that if she can clean up her acts, she still has significant amount of time and chance to be a good mother to her son.   [name] attended treatment team meeting this am and met with her treatment team members. Her reason for admission, symptoms, response to treatment and discharge plans discussed. [name]  endorsed that her symptoms has stabilized and hat she is ready for discharge to pursue the next phase of her substance abue treatment. It was then agreed upon that patient will follow-up care at [facility] here in [place] for the time being for medication management and routine psychiatric care. She is instructed and made aware that this also walk-in appointment between the hours of 09:00 am to 3:00  pm. However, on 03/26/13, [name] will follow-up at the the [facility] for substance abuse treatment. She is instructed to report at the lobby at 08:00 am. The addresses, dates, times and contact information for both [facility] and [facility] provided for patient.  Upon discharge, [name] adamantly denies any suicidal, homicidal ideations, auditory, visual hallucinations, withdrawal symptoms and or delusional thinking. She was provided with 4 days worth supply samples of her [facility] discharge medications. She left [facility] with all personal belongings via city bus transport in no apparent distress. Bus fare provided by [facility].  Consults:  None  Significant Diagnostic Studies:  labs: CBC with diff, CMP, UDS, Toxicology tests  Discharge Vitals:   Blood pressure 120/75, pulse 73, temperature 98 F (36.7 C), temperature source Oral, resp. rate 18, height 5' (1.524 m), weight 52.844 kg (116 lb 8 oz). Body mass index is 22.75 kg/(m^2). Lab Results:   No results found for this or any previous visit (from the past 72 hour(s)).  Physical Findings: AIMS: Facial and Oral Movements Muscles of Facial Expression: None, normal Lips and Perioral Area: None, normal Jaw: None, normal Tongue: None, normal,Extremity Movements Upper (arms, wrists, hands, fingers): None, normal Lower (legs, knees, ankles, toes): None, normal, Trunk Movements Neck, shoulders, hips: None, normal, Overall Severity Severity of abnormal movements (highest score from questions above): None, normal Incapacitation due to abnormal movements: None, normal Patient's awareness of abnormal movements (rate only patient's report): No Awareness, Dental Status Current problems with teeth and/or dentures?: Yes (missing front/lateral teeth) Does patient usually wear dentures?: No  CIWA:  CIWA-Ar Total: 1 COWS:  COWS Total Score: 1  Psychiatric Specialty Exam: See Psychiatric Specialty Exam and Suicide Risk Assessment completed by  Attending Physician prior to discharge.  Discharge destination:  Home  Is patient on multiple antipsychotic therapies at discharge:  No   Has Patient had three or more failed trials of antipsychotic monotherapy by history:  No  Recommended Plan for Multiple Antipsychotic Therapies: NA     Medication List    TAKE these medications     Indication   FLUoxetine 10 MG capsule  Commonly known as:  PROZAC  Take 1 capsule (10 mg total) by mouth daily. For depression   Indication:  Depression, Major Depressive Disorder     multivitamin with minerals Tabs  Take 1 tablet by mouth daily. For nutritional supplement.   Indication:  Vitamin supplement.     QUEtiapine 400 MG tablet  Commonly known as:  SEROQUEL  Take 1 tablet (400 mg total) by mouth at bedtime. For mood control   Indication:  Depressive Phase of Manic-Depression, Trouble Sleeping, Manic Phase of Manic-Depression     QUEtiapine 50 MG tablet  Commonly known as:  SEROQUEL  Take 1 tablet (50 mg total) by mouth 2 (two) times daily. For mood control/anxiety   Indication:  Depressive Phase of Manic-Depression, Trouble Sleeping, Manic Phase of Manic-Depression, Mood instability     traZODone 50 MG tablet  Commonly known as:  DESYREL  Take 1 tablet (50 mg total) by mouth at bedtime as needed for sleep (  May repeat x1). For depression/sleep   Indication:  Trouble Sleeping, Major Depressive Disorder       Follow-up Information   Follow up with [facility]. (Please follow up with the walk in clinic at [facility] Monday - Friday 9am - 3 pm.)    Contact information:   [address] [place], [place] [address] Ph [phone] FAX [phone]      Follow up with [facility]  On 03/26/2013. (Be in [place] [facility] on Tuesday 03/26/13 at 8 AM)    Contact information:   [address] [place], [place] [address] [facility] [phone] FAX [phone]     Follow-up recommendations: Activity:  As tolerated Diet: As recommended by your  primary care doctor. Keep all scheduled follow-up appointments as recommended.  Follow up relapse prevention plan Comments: Take all your medications as prescribed by your mental healthcare provider. Report any adverse effects and or reactions from your medicines to your outpatient provider promptly. Patient is instructed and cautioned to not engage in alcohol and or illegal drug use while on prescription medicines. In the event of worsening symptoms, patient is instructed to call the crisis hotline, 911 and or go to the nearest ED for appropriate evaluation and treatment of symptoms. Follow-up with your primary care provider for your other medical issues, concerns and or health care needs.    Total Discharge Time:  Greater than 30 minutes.  SignedArmandina Stammer I 03/12/2013, 11:16 AM

## 2013-03-17 NOTE — Progress Notes (Signed)
Patient Discharge Instructions:  After Visit Summary (AVS):   Faxed to:  03/17/13 Discharge Summary Note:   Faxed to:  03/17/13 Psychiatric Admission Assessment Note:   Faxed to:  03/17/13 Suicide Risk Assessment - Discharge Assessment:   Faxed to:  03/17/13 Faxed/Sent to the Next Level Care provider:  03/17/13 Faxed to [facility] @ [phone] Faxed to [facility] @ [phone]  Jerelene Redden, 03/17/2013, 3:43 PM

## 2013-03-19 NOTE — ED Provider Notes (Signed)
Medical screening examination/treatment/procedure(s) were performed by non-physician practitioner and as supervising physician I was immediately available for consultation/collaboration.   Nicole Hafley, MD 03/19/13 2217 

## 2013-06-02 ENCOUNTER — Emergency Department (HOSPITAL_COMMUNITY)
Admission: EM | Admit: 2013-06-02 | Discharge: 2013-06-02 | Disposition: A | Discharge status: Home / Self Care | Payer: Medicare Other | Attending: Emergency Medicine | Admitting: Emergency Medicine

## 2013-06-02 ENCOUNTER — Encounter (HOSPITAL_COMMUNITY): Payer: Self-pay | Admitting: Family Medicine

## 2013-06-02 DIAGNOSIS — Z79899 Other long term (current) drug therapy: Secondary | ICD-10-CM | POA: Insufficient documentation

## 2013-06-02 DIAGNOSIS — R350 Frequency of micturition: Secondary | ICD-10-CM | POA: Insufficient documentation

## 2013-06-02 DIAGNOSIS — R109 Unspecified abdominal pain: Secondary | ICD-10-CM | POA: Insufficient documentation

## 2013-06-02 DIAGNOSIS — Z760 Encounter for issue of repeat prescription: Secondary | ICD-10-CM | POA: Insufficient documentation

## 2013-06-02 DIAGNOSIS — F1910 Other psychoactive substance abuse, uncomplicated: Secondary | ICD-10-CM | POA: Insufficient documentation

## 2013-06-02 DIAGNOSIS — R300 Dysuria: Secondary | ICD-10-CM | POA: Insufficient documentation

## 2013-06-02 DIAGNOSIS — K089 Disorder of teeth and supporting structures, unspecified: Secondary | ICD-10-CM | POA: Insufficient documentation

## 2013-06-02 DIAGNOSIS — F17200 Nicotine dependence, unspecified, uncomplicated: Secondary | ICD-10-CM | POA: Insufficient documentation

## 2013-06-02 DIAGNOSIS — F319 Bipolar disorder, unspecified: Secondary | ICD-10-CM | POA: Insufficient documentation

## 2013-06-02 DIAGNOSIS — K0889 Other specified disorders of teeth and supporting structures: Secondary | ICD-10-CM

## 2013-06-02 LAB — URINALYSIS, ROUTINE W REFLEX MICROSCOPIC
Bilirubin Urine: NEGATIVE
Glucose, UA: NEGATIVE mg/dL
Hgb urine dipstick: NEGATIVE
Protein, ur: NEGATIVE mg/dL
Specific Gravity, Urine: 1.02 (ref 1.005–1.030)

## 2013-06-02 MED ORDER — QUETIAPINE FUMARATE 50 MG PO TABS: 50.0000 mg | ORAL_TABLET | Freq: Two times a day (BID) | ORAL | Status: DC

## 2013-06-02 MED ORDER — HYDROCODONE-ACETAMINOPHEN 5-325 MG PO TABS
1.0000 | ORAL_TABLET | Freq: Once | ORAL | Status: AC
  Administered 2013-06-02: 1 via ORAL
  Filled 2013-06-02: qty 1

## 2013-06-02 MED ORDER — QUETIAPINE FUMARATE 400 MG PO TABS
400.0000 mg | ORAL_TABLET | Freq: Every day | ORAL | Status: DC
Start: 2013-06-02 — End: 2013-09-30

## 2013-06-02 MED ORDER — PENICILLIN V POTASSIUM 500 MG PO TABS: 500.0000 mg | ORAL_TABLET | Freq: Four times a day (QID) | ORAL | Status: AC

## 2013-06-02 MED ORDER — METRONIDAZOLE 500 MG PO TABS: 500.0000 mg | ORAL_TABLET | Freq: Two times a day (BID) | ORAL | Status: DC

## 2013-06-02 NOTE — ED Provider Notes (Signed)
History     CSN: [id number]  Arrival date & time 06/02/13  1246   First MD Initiated Contact with Patient 06/02/13 1305      Chief Complaint  Patient presents with  . Dental Pain    (Consider location/radiation/quality/duration/timing/severity/associated sxs/prior treatment) HPI Comments: 52 y.o. Female with PMHx of substance abuse, biopolar disorder, and schizophrenia presents today with multiple complaints:  Tooth pain: Top front tooth has been causing pain on and off for about a month, increasing in severity over the last three days. The patient has tried to alleviate pain with ibuprofen with moderate success.  Pain rated at a 10/10, characterized as throbbing in nature and located top, center and top, left. Patient denies fever, night sweats, chills, difficulty swallowing or opening mouth, SOB, nuchal rigidity or decreased ROM of neck.  Patient does not have a dentist and requests a resource guide at discharge.  UTI: Pt states about 3 days ago, she noticed pain on urination. She describes the pain as burning and similar to UTIs she has had in the past. Pain is moderate and localized suprapubically. It does not radiate to the flank. She has taken no interventions. Denies fever, hematuria, vaginal discharge, vaginal bleeding.   Medication refill: Pt request refills for her Seroquel today and tomorrow until she can get to her doctor for a refill.     Patient is a 52 y.o. female presenting with tooth pain.  Dental Pain Associated symptoms: no fever, no headaches and no neck pain     Past Medical History  Diagnosis Date  . Depression   . Bipolar 1 disorder   . Drug abuse     Past Surgical History  Procedure Laterality Date  . No past surgeries      Family History  Problem Relation Age of Onset  . Anesthesia problems Neg Hx   . Hypotension Neg Hx   . Malignant hyperthermia Neg Hx   . Pseudochol deficiency Neg Hx     History  Substance Use Topics  . Smoking status:  Current Every Day Smoker -- 0.50 packs/day for 10 years    Types: Cigarettes  . Smokeless tobacco: Never Used  . Alcohol Use: 15.0 oz/week    25 Cans of beer per week     Comment: Weekly     OB History   Grav Para Term Preterm Abortions TAB SAB Ect Mult Living                  Review of Systems  Constitutional: Negative for fever, chills and diaphoresis.  HENT: Positive for dental problem. Negative for neck pain, neck stiffness and voice change.   Eyes: Negative for visual disturbance.  Respiratory: Negative for apnea, chest tightness and shortness of breath.   Cardiovascular: Negative for chest pain and palpitations.  Gastrointestinal: Positive for abdominal pain. Negative for nausea, vomiting, diarrhea and constipation.       Suprapubic  Genitourinary: Positive for dysuria and frequency. Negative for hematuria, flank pain, vaginal bleeding, vaginal discharge, difficulty urinating and pelvic pain.  Musculoskeletal: Negative for gait problem.  Skin: Negative for rash.  Neurological: Negative for dizziness, weakness, light-headedness, numbness and headaches.  Psychiatric/Behavioral: Negative for behavioral problems.    Allergies  Review of patient's allergies indicates no known allergies.  Home Medications   Current Outpatient Rx  Name  Route  Sig  Dispense  Refill  . FLUoxetine (PROZAC) 10 MG capsule   Oral   Take 1 capsule (10 mg total) by mouth  daily. For depression   30 capsule   0   . Multiple Vitamin (MULTIVITAMIN WITH MINERALS) TABS   Oral   Take 1 tablet by mouth daily. For nutritional supplement.         . QUEtiapine (SEROQUEL) 400 MG tablet   Oral   Take 1 tablet (400 mg total) by mouth at bedtime. For mood control   30 tablet   0   . QUEtiapine (SEROQUEL) 50 MG tablet   Oral   Take 1 tablet (50 mg total) by mouth 2 (two) times daily. For mood control/anxiety   60 tablet   0   . traZODone (DESYREL) 50 MG tablet   Oral   Take 1 tablet (50 mg  total) by mouth at bedtime as needed for sleep (May repeat x1). For depression/sleep   30 tablet   0     BP 116/75  Pulse 71  Temp(Src) 97.5 F (36.4 C) (Oral)  Resp 18  SpO2 100%  Physical Exam  Nursing note and vitals reviewed. Constitutional: She is oriented to person, place, and time. She appears well-developed and well-nourished. No distress.  HENT:  Head: Normocephalic and atraumatic. No trismus in the jaw.  Mouth/Throat: No dental abscesses. No tonsillar abscesses.    Oropharynx is clear. Teeth appear carious. Very tender at teeth #9, 12, and 13 with erythema and edema of the underlying gingiva. Multiple other teeth have grounds or are previously extracted. Neck is nontender and supple without adenopathy or JVD.  Eyes: Conjunctivae and EOM are normal.  Neck: Normal range of motion. Neck supple.  Cardiovascular: Normal rate, regular rhythm, normal heart sounds and intact distal pulses.   Pulmonary/Chest: Effort normal and breath sounds normal. No respiratory distress. She has no wheezes.  Abdominal: Soft. Bowel sounds are normal. There is tenderness.  suprapubic  Musculoskeletal: Normal range of motion.  Neurological: She is alert and oriented to person, place, and time. No cranial nerve deficit.  Skin: Skin is warm and dry. No rash noted. She is not diaphoretic.  Psychiatric: She has a normal mood and affect.    ED Course  Procedures (including critical care time)  Labs Reviewed  URINALYSIS, ROUTINE W REFLEX MICROSCOPIC   No results found.   1. Medicine refill   2. Pain, dental       MDM  Will prescribe two days' worth of Seroquel for pt, 50 mg BID and 400 mg at bedtime. Pt states she will get refills in the next day or two and just needs to get by until then.  For dental pain, on physical exam, no gross abscess.  Exam unconcerning for Ludwig's angina or spread of infection.  Will treat with penicillin and pain medicine in ED, but will not write a  prescription for pain meds d/t hx of substance abuse and length of time this is ongoing.  Pt requests flagyl with abx as she typically gets yeast infections when treated. Urged patient to follow-up with dentist.  Provided resource material. Discussed options for treatment with pt who understands and is in agreement with discharge plan.  Pt is afebrile, no CVA tenderness, normotensive, and denies N/V. Urinalysis shows no UTI.         Glade Nurse, PA-C 06/02/13 2131

## 2013-06-02 NOTE — ED Notes (Signed)
Bus pass given to patient.

## 2013-06-02 NOTE — ED Notes (Signed)
Per pt toothache, UTI symptoms and needs med refill

## 2013-06-02 NOTE — ED Notes (Signed)
Patient here for medication refill for Seroquel only has enough for tonight.

## 2013-06-06 NOTE — ED Provider Notes (Signed)
History/physical exam/procedure(s) were performed by non-physician practitioner and as supervising physician I was immediately available for consultation/collaboration. I have reviewed all notes and am in agreement with care and plan.   Elvera Almario S Adalay Azucena, MD 06/06/13 2206 

## 2013-09-30 ENCOUNTER — Emergency Department (HOSPITAL_COMMUNITY)
Admission: EM | Admit: 2013-09-30 | Discharge: 2013-09-30 | Disposition: A | Discharge status: Other Acute Inpatient Hospital | Payer: No Typology Code available for payment source | Attending: Emergency Medicine | Admitting: Emergency Medicine

## 2013-09-30 ENCOUNTER — Encounter (HOSPITAL_COMMUNITY): Payer: Self-pay | Admitting: Emergency Medicine

## 2013-09-30 ENCOUNTER — Inpatient Hospital Stay (HOSPITAL_COMMUNITY)
Admission: RE | Admit: 2013-09-30 | Discharge: 2013-10-06 | DRG: 885 | Disposition: A | Discharge status: Home / Self Care | Payer: No Typology Code available for payment source | Attending: Psychiatry | Admitting: Psychiatry

## 2013-09-30 DIAGNOSIS — Z803 Family history of malignant neoplasm of breast: Secondary | ICD-10-CM

## 2013-09-30 DIAGNOSIS — F1910 Other psychoactive substance abuse, uncomplicated: Secondary | ICD-10-CM

## 2013-09-30 DIAGNOSIS — F319 Bipolar disorder, unspecified: Secondary | ICD-10-CM

## 2013-09-30 DIAGNOSIS — Z3202 Encounter for pregnancy test, result negative: Secondary | ICD-10-CM | POA: Insufficient documentation

## 2013-09-30 DIAGNOSIS — Z79899 Other long term (current) drug therapy: Secondary | ICD-10-CM

## 2013-09-30 DIAGNOSIS — F1420 Cocaine dependence, uncomplicated: Secondary | ICD-10-CM

## 2013-09-30 DIAGNOSIS — F22 Delusional disorders: Secondary | ICD-10-CM | POA: Insufficient documentation

## 2013-09-30 DIAGNOSIS — F3160 Bipolar disorder, current episode mixed, unspecified: Principal | ICD-10-CM | POA: Diagnosis present

## 2013-09-30 DIAGNOSIS — N390 Urinary tract infection, site not specified: Secondary | ICD-10-CM | POA: Diagnosis present

## 2013-09-30 DIAGNOSIS — R443 Hallucinations, unspecified: Secondary | ICD-10-CM

## 2013-09-30 DIAGNOSIS — Z598 Other problems related to housing and economic circumstances: Secondary | ICD-10-CM

## 2013-09-30 DIAGNOSIS — G4700 Insomnia, unspecified: Secondary | ICD-10-CM | POA: Diagnosis present

## 2013-09-30 DIAGNOSIS — F909 Attention-deficit hyperactivity disorder, unspecified type: Secondary | ICD-10-CM | POA: Insufficient documentation

## 2013-09-30 DIAGNOSIS — R51 Headache: Secondary | ICD-10-CM | POA: Insufficient documentation

## 2013-09-30 DIAGNOSIS — F1920 Other psychoactive substance dependence, uncomplicated: Secondary | ICD-10-CM | POA: Diagnosis present

## 2013-09-30 DIAGNOSIS — F29 Unspecified psychosis not due to a substance or known physiological condition: Secondary | ICD-10-CM

## 2013-09-30 DIAGNOSIS — R45851 Suicidal ideations: Secondary | ICD-10-CM | POA: Diagnosis present

## 2013-09-30 DIAGNOSIS — Z609 Problem related to social environment, unspecified: Secondary | ICD-10-CM

## 2013-09-30 DIAGNOSIS — F1410 Cocaine abuse, uncomplicated: Secondary | ICD-10-CM | POA: Insufficient documentation

## 2013-09-30 DIAGNOSIS — Z5987 Material hardship due to limited financial resources, not elsewhere classified: Secondary | ICD-10-CM

## 2013-09-30 DIAGNOSIS — F17200 Nicotine dependence, unspecified, uncomplicated: Secondary | ICD-10-CM | POA: Diagnosis present

## 2013-09-30 DIAGNOSIS — F411 Generalized anxiety disorder: Secondary | ICD-10-CM | POA: Insufficient documentation

## 2013-09-30 DIAGNOSIS — F1994 Other psychoactive substance use, unspecified with psychoactive substance-induced mood disorder: Secondary | ICD-10-CM | POA: Diagnosis present

## 2013-09-30 DIAGNOSIS — R739 Hyperglycemia, unspecified: Secondary | ICD-10-CM

## 2013-09-30 LAB — COMPREHENSIVE METABOLIC PANEL
Alkaline Phosphatase: 60 U/L (ref 39–117)
BUN: 15 mg/dL (ref 6–23)
CO2: 23 mEq/L (ref 19–32)
Chloride: 107 mEq/L (ref 96–112)
GFR calc Af Amer: 90 mL/min — ABNORMAL LOW (ref >90)
GFR calc non Af Amer: 77 mL/min — ABNORMAL LOW (ref >90)
Glucose, Bld: 133 mg/dL — ABNORMAL HIGH (ref 70–99)
Potassium: 3.9 mEq/L (ref 3.5–5.1)
Total Bilirubin: 0.1 mg/dL — ABNORMAL LOW (ref 0.3–1.2)

## 2013-09-30 LAB — RAPID URINE DRUG SCREEN, HOSP PERFORMED
Barbiturates: NOT DETECTED
Benzodiazepines: NOT DETECTED

## 2013-09-30 LAB — CBC
HCT: 45.3 % (ref 36.0–46.0)
Hemoglobin: 15.1 g/dL — ABNORMAL HIGH (ref 12.0–15.0)
MCV: 88.1 fL (ref 78.0–100.0)
WBC: 7.5 10*3/uL (ref 4.0–10.5)

## 2013-09-30 LAB — ACETAMINOPHEN LEVEL: Acetaminophen (Tylenol), Serum: <15 ug/mL (ref 10–30)

## 2013-09-30 LAB — ETHANOL: Alcohol, Ethyl (B): <11 mg/dL (ref 0–11)

## 2013-09-30 MED ORDER — FLUOXETINE HCL 10 MG PO CAPS
10.0000 mg | ORAL_CAPSULE | Freq: Every day | ORAL | Status: DC
  Filled 2013-09-30: qty 1

## 2013-09-30 MED ORDER — ALUM & MAG HYDROXIDE-SIMETH 200-200-20 MG/5ML PO SUSP: 30.0000 mL | ORAL | Status: DC | PRN

## 2013-09-30 MED ORDER — QUETIAPINE FUMARATE 50 MG PO TABS: 50.0000 mg | ORAL_TABLET | Freq: Two times a day (BID) | ORAL | Status: DC

## 2013-09-30 MED ORDER — NICOTINE 21 MG/24HR TD PT24: 21.0000 mg | MEDICATED_PATCH | Freq: Every day | TRANSDERMAL | Status: DC

## 2013-09-30 MED ORDER — ADULT MULTIVITAMIN W/MINERALS CH
1.0000 | ORAL_TABLET | Freq: Every day | ORAL | Status: DC
  Administered 2013-09-30 – 2013-10-06 (×6): 1 via ORAL
  Filled 2013-09-30: qty 14
  Filled 2013-09-30: qty 1
  Filled 2013-09-30: qty 14
  Filled 2013-09-30 (×4): qty 1
  Filled 2013-09-30: qty 14
  Filled 2013-09-30 (×4): qty 1

## 2013-09-30 MED ORDER — MAGNESIUM HYDROXIDE 400 MG/5ML PO SUSP: 30.0000 mL | Freq: Every day | ORAL | Status: DC | PRN

## 2013-09-30 MED ORDER — TRAZODONE HCL 50 MG PO TABS: 50.0000 mg | ORAL_TABLET | Freq: Every evening | ORAL | Status: DC | PRN

## 2013-09-30 MED ORDER — IBUPROFEN 200 MG PO TABS: 600.0000 mg | ORAL_TABLET | Freq: Three times a day (TID) | ORAL | Status: DC | PRN

## 2013-09-30 MED ORDER — ACETAMINOPHEN 325 MG PO TABS: 650.0000 mg | ORAL_TABLET | ORAL | Status: DC | PRN

## 2013-09-30 MED ORDER — ONDANSETRON HCL 4 MG PO TABS: 4.0000 mg | ORAL_TABLET | Freq: Three times a day (TID) | ORAL | Status: DC | PRN

## 2013-09-30 MED ORDER — QUETIAPINE FUMARATE 400 MG PO TABS
400.0000 mg | ORAL_TABLET | Freq: Every day | ORAL | Status: DC
  Administered 2013-09-30 – 2013-10-05 (×6): 400 mg via ORAL
  Filled 2013-09-30 (×3): qty 1
  Filled 2013-09-30: qty 2
  Filled 2013-09-30 (×2): qty 14
  Filled 2013-09-30: qty 2
  Filled 2013-09-30 (×2): qty 1
  Filled 2013-09-30: qty 14

## 2013-09-30 MED ORDER — IBUPROFEN 200 MG PO TABS
600.0000 mg | ORAL_TABLET | Freq: Once | ORAL | Status: AC
  Administered 2013-09-30: 600 mg via ORAL
  Filled 2013-09-30: qty 3

## 2013-09-30 MED ORDER — ACETAMINOPHEN 325 MG PO TABS
650.0000 mg | ORAL_TABLET | Freq: Four times a day (QID) | ORAL | Status: DC | PRN
  Administered 2013-10-04: 650 mg via ORAL
  Filled 2013-09-30: qty 2

## 2013-09-30 MED ORDER — QUETIAPINE FUMARATE 300 MG PO TABS: 400.0000 mg | ORAL_TABLET | Freq: Every day | ORAL | Status: DC

## 2013-09-30 NOTE — ED Provider Notes (Signed)
Medical screening examination/treatment/procedure(s) were performed by non-physician practitioner and as supervising physician I was immediately available for consultation/collaboration.  Toy Baker, MD 09/30/13 [phone]

## 2013-09-30 NOTE — Tx Team (Signed)
Initial Interdisciplinary Treatment Plan  PATIENT STRENGTHS: (choose at least two) Ability for insight Active sense of humor Average or above average intelligence Capable of independent living Communication skills General fund of knowledge Motivation for treatment/growth  PATIENT STRESSORS: Medication change or noncompliance Substance abuse   PROBLEM LIST: Problem List/Patient Goals Date to be addressed Date deferred Reason deferred Estimated date of resolution  Medication Non-compliance 09/30/2013                                                      DISCHARGE CRITERIA:  Improved stabilization in mood, thinking, and/or behavior Need for constant or close observation no longer present Verbal commitment to aftercare and medication compliance  PRELIMINARY DISCHARGE PLAN: Outpatient therapy Return to previous living arrangement  PATIENT/FAMIILY INVOLVEMENT: This treatment plan has been presented to and reviewed with the patient, [name], and/or family member.  The patient and family have been given the opportunity to ask questions and make suggestions.  Renaee Munda 09/30/2013, 8:04 PM

## 2013-09-30 NOTE — Progress Notes (Addendum)
Patient ID: [name], female   DOB: Sep 17, 1961, 52 y.o.   MRN: [id number]  Admission Note: Patient is a 52 yo female admitted to [facility] voluntarily for manic state due to not being on her meds for two weeks. Pt states she can afford meds but is just not compliant. Pt states she would like to start attending the outpatient program here. Pt with multiple admissions to [facility] in the past. Pt states she is hearing voices and seeing things. Pt denies SI/HI. Pt hyperactive but pleasant. Pt with bright affect. Pt states that earlier the voices were telling her to hurt herself, but they are not doing that currently. Pt verbally contracts for safety.

## 2013-09-30 NOTE — BH Assessment (Signed)
Assessment Note  [name] is an 52 y.o. female presents to [facility] voluntarily due to increasing AVH, pt said "I'm hearing voices and they telling me to hurt myself". Pt reports that she is seeing shapes and shadows and said "I need to come in the hospital, cuz I been out of my medicine for about 2 weeks". Pt denies HI, Delusions or Psychosis, and appears to be responding to internal stimuli and said "some people are after me". Pt reports that her relationship to 2 guys is stressful and she currently lives with a friend and said "yeah, I'm safe there". Pt reports numerous admissions for suicide attempts since 1979 and said "I haven't tried that since 1999, it's been a long time". Pt confirms that she "smoked crack sometime last week, had some beer yesterday and smoked cigarettes today". Pt reports that she has a HA that has lasted 2 days, yet she has been up without sleep for the last 3 days. Pt scores her pain level at "10+ and my eyes hurt". Pt reports that her appetite is decreased, concentration is decreased, recent memory is impaired/remote memory is intact. Pt reports that she is currently receiving services through [facility] and "my last appointment was last month, in the early part". Pt reports that she can complete all ADL's w/o assistance. Denice Bors, [facility] 09/30/2013 5:47 PM   Axis I: Major Depression, Recurrent severe and Paranoia Axis II: Deferred Axis III:  Past Medical History  Diagnosis Date  . Depression   . Bipolar 1 disorder   . Drug abuse    Axis IV: economic problems, housing problems, other psychosocial or environmental problems, problems related to social environment and problems with primary support group Axis V: 31-40 impairment in reality testing  Past Medical History:  Past Medical History  Diagnosis Date  . Depression   . Bipolar 1 disorder   . Drug abuse     Past Surgical History  Procedure Laterality Date  . No past surgeries      Family  History:  Family History  Problem Relation Age of Onset  . Anesthesia problems Neg Hx   . Hypotension Neg Hx   . Malignant hyperthermia Neg Hx   . Pseudochol deficiency Neg Hx     Social History:  reports that she has been smoking Cigarettes.  She has a 5 pack-year smoking history. She has never used smokeless tobacco. She reports that she drinks about 15.0 ounces of alcohol per week. She reports that she uses illicit drugs ("Crack" cocaine and Marijuana).  Additional Social History:  Alcohol / Drug Use Pain Medications: pt denies Prescriptions: pt denies Over the Counter: pt denies History of alcohol / drug use?: Yes Substance #1 Name of Substance 1: beer 1 - Last Use / Amount: 09/29/13 Substance #2 Name of Substance 2: nicotine 2 - Last Use / Amount: 09/30/13 Substance #3 Name of Substance 3: crack cocaine 3 - Last Use / Amount: 09/26/13  CIWA: CIWA-Ar BP: 117/68 mmHg Pulse Rate: 73 COWS:    Allergies: No Known Allergies  Home Medications:  (Not in a hospital admission)  OB/GYN Status:  No LMP recorded. Patient is not currently having periods (Reason: Perimenopausal).  General Assessment Data Location of Assessment: [facility] Services Is this a Tele or Face-to-Face Assessment?: Face-to-Face Is this an Initial Assessment or a Re-assessment for this encounter?: Initial Assessment Living Arrangements: Non-relatives/Friends Can pt return to current living arrangement?: Yes Admission Status: Voluntary Is patient capable of signing voluntary admission?: Yes  Transfer from: Home Referral Source: Self/Family/Friend  Medical Screening Exam [facility] Walk-in ONLY) Medical Exam completed: No Reason for MSE not completed: Other: (medical clearance)  [facility] Crisis Care Plan Living Arrangements: Non-relatives/Friends     Risk to self Suicidal Ideation: Yes-Currently Present Suicidal Intent: No Is patient at risk for suicide?: No, but patient needs Medical Clearance Suicidal  Plan?: No Access to Means: No What has been your use of drugs/alcohol within the last 12 months?:  (beer, crack cocaine) Previous Attempts/Gestures: Yes How many times?:  (pt reports too many to count) Other Self Harm Risks:  (none noted) Triggers for Past Attempts: Hallucinations;Other personal contacts Intentional Self Injurious Behavior: None Family Suicide History: No Recent stressful life event(s): Conflict (Comment);Financial Problems Persecutory voices/beliefs?: No Depression: Yes Depression Symptoms: Isolating;Fatigue;Guilt;Loss of interest in usual pleasures;Feeling worthless/self pity;Feeling angry/irritable;Insomnia Substance abuse history and/or treatment for substance abuse?: Yes Suicide prevention information given to non-admitted patients: Not applicable  Risk to Others Homicidal Ideation: No Thoughts of Harm to Others: No Current Homicidal Intent: No Current Homicidal Plan: No Access to Homicidal Means: No History of harm to others?: No Assessment of Violence: None Noted Does patient have access to weapons?: No Criminal Charges Pending?: No Does patient have a court date: No  Psychosis Hallucinations: Auditory;With command;Visual  Mental Status Report Appear/Hygiene: Disheveled Eye Contact: Fair Motor Activity: Freedom of movement Speech: Logical/coherent Level of Consciousness: Alert Mood: Depressed Affect: Appropriate to circumstance Anxiety Level: Minimal Thought Processes: Coherent;Relevant Judgement: Impaired Orientation: Person;Place;Time;Situation;Appropriate for developmental age Obsessive Compulsive Thoughts/Behaviors: None  Cognitive Functioning Concentration: Decreased Memory: Recent Impaired;Remote Intact IQ: Average Insight: Poor Impulse Control: Poor Appetite: Poor Weight Loss:  (pt not sure) Sleep: Decreased Total Hours of Sleep:  (2-3/24) Vegetative Symptoms: Decreased grooming  ADLScreening [facility] Assessment Services) Patient's  cognitive ability adequate to safely complete daily activities?: Yes Patient able to express need for assistance with ADLs?: Yes Independently performs ADLs?: Yes (appropriate for developmental age)  Prior Inpatient Therapy Prior Inpatient Therapy: Yes Prior Therapy Dates:  (pt reports numerous from [phone]) Prior Therapy Facilty/Provider(s):  ([place], [facility] and other facilities) Reason for Treatment:  (depression, hallucinations)  Prior Outpatient Therapy Prior Outpatient Therapy: Yes Prior Therapy Dates:  (current) Prior Therapy Facilty/Provider(s):  [facility]useum/[facility] cur[facility]) Reason for Treatment:  (depression, hallucinations)  ADL Screening (condition at time of admission) Patient's cognitive ability adequate to safely complete daily activities?: Yes Is the patient deaf or have difficulty hearing?: No Does the patient have difficulty seeing, even when wearing glasses/contacts?: No Does the patient have difficulty concentrating, remembering, or making decisions?: Yes Patient able to express need for assistance with ADLs?: Yes Does the patient have difficulty dressing or bathing?: No Independently performs ADLs?: Yes (appropriate for developmental age) Does the patient have difficulty walking or climbing stairs?: No Weakness of Legs: None Weakness of Arms/Hands: None  Home Assistive Devices/Equipment Home Assistive Devices/Equipment: None    Abuse/Neglect Assessment (Assessment to be complete while patient is alone) Physical Abuse: Denies Verbal Abuse: Denies Sexual Abuse: Denies Exploitation of patient/patient's resources: Denies Self-Neglect: Denies Values / Beliefs Cultural Requests During Hospitalization: None Spiritual Requests During Hospitalization: None Consults Spiritual Care Consult Needed: No Social Work Consult Needed: No Merchant navy officer (For Healthcare) Advance Directive: Patient does not have advance directive Pre-existing out of facility DNR order (yellow form or  pink MOST form): No Nutrition Screen- [facility] Adult/WL/AP Patient's home diet: Regular  Additional Information 1:1 In Past 12 Months?: No CIRT Risk: No Elopement Risk: No Does patient have medical clearance?: No  Disposition: Pt accepted Fransisca Kaufmann, NP, to Dr. Jannifer Franklin, bed 401-1 Disposition Initial Assessment Completed for this Encounter: Yes Disposition of Patient: Inpatient treatment program Type of inpatient treatment program: Adult  On Site Evaluation by:   Reviewed with Physician:    Manual Meier 09/30/2013 4:52 PM

## 2013-09-30 NOTE — ED Notes (Signed)
Pt states that she has been off Seroquel for two weeks bc she ran out and doesn't think she has a refill on it. Pt states that she got it prescribed by [facility] last time. Pt has PMH bipolar.  Pt states that she is seeing things and hearing things that arent real.  Pt states she sees people in her food and hears bad things about herself and that people are coming after her. Pt is very fidgety and restless in triage chair.  Pt states "Im very paranoid".

## 2013-09-30 NOTE — ED Provider Notes (Signed)
CSN: [id number]     Arrival date & time 09/30/13  1626 History  This chart was scribed for non-physician practitioner working with Toy Baker, MD by Ashley Jacobs, ED scribe. This patient was seen in room WTR4/WLPT4 and the patient's care was started at 4:42 PM.   First MD Initiated Contact with Patient 09/30/13 1637     Chief Complaint  Patient presents with  . Medical Clearance   (Consider location/radiation/quality/duration/timing/severity/associated sxs/prior Treatment) The history is provided by the patient and medical records. No language interpreter was used.   HPI Comments: [name] is a 52 y.o. bipolar female who was sent by Behavioral Health presents to the Emergency Department for medical clearance for admittance to [facility]. She was seen at [facility] just prior to arrival. Pt has a hx of crack cocaine drug use and marijuana use. She is calm in demeanor and collect in thought. Pt reports that she has not taken her Seroquel for two weeks and she is now experiencing hallucinations. She mentions hearing harmful voices, seeing faces in her food and having paranoid thoughts. Pt is experiencing the associated symptom a constant, moderate headache that is not relieved by anything. She also denies back pain, chest pain and dysuria. Pt has a PMH of bipolar disorder, depression and drug abuse.     Past Medical History  Diagnosis Date  . Depression   . Bipolar 1 disorder   . Drug abuse    Past Surgical History  Procedure Laterality Date  . No past surgeries     Family History  Problem Relation Age of Onset  . Anesthesia problems Neg Hx   . Hypotension Neg Hx   . Malignant hyperthermia Neg Hx   . Pseudochol deficiency Neg Hx    History  Substance Use Topics  . Smoking status: Current Every Day Smoker -- 0.50 packs/day for 10 years    Types: Cigarettes  . Smokeless tobacco: Never Used  . Alcohol Use: 15.0 oz/week    25 Cans of beer per week     Comment:  Weekly    OB History   Grav Para Term Preterm Abortions TAB SAB Ect Mult Living                 Review of Systems  Constitutional: Negative for fever.  HENT: Negative for sore throat and rhinorrhea.   Eyes: Negative for redness.  Respiratory: Negative for cough.   Cardiovascular: Negative for chest pain.  Gastrointestinal: Negative for nausea, vomiting, abdominal pain and diarrhea.  Genitourinary: Negative for dysuria.  Musculoskeletal: Negative for myalgias and back pain.  Skin: Negative for rash.  Neurological: Negative for headaches.  Psychiatric/Behavioral: Positive for hallucinations. Negative for suicidal ideas and self-injury. The patient is nervous/anxious.     Allergies  Review of patient's allergies indicates no known allergies.  Home Medications   Current Outpatient Rx  Name  Route  Sig  Dispense  Refill  . FLUoxetine (PROZAC) 10 MG capsule   Oral   Take 1 capsule (10 mg total) by mouth daily. For depression   30 capsule   0   . metroNIDAZOLE (FLAGYL) 500 MG tablet   Oral   Take 1 tablet (500 mg total) by mouth 2 (two) times daily.   14 tablet   0   . Multiple Vitamin (MULTIVITAMIN WITH MINERALS) TABS   Oral   Take 1 tablet by mouth daily. For nutritional supplement.         . QUEtiapine (SEROQUEL) 400  MG tablet   Oral   Take 1 tablet (400 mg total) by mouth at bedtime. For mood control   30 tablet   0   . QUEtiapine (SEROQUEL) 400 MG tablet   Oral   Take 1 tablet (400 mg total) by mouth at bedtime.   2 tablet   0   . QUEtiapine (SEROQUEL) 50 MG tablet   Oral   Take 1 tablet (50 mg total) by mouth 2 (two) times daily. For mood control/anxiety   60 tablet   0   . QUEtiapine (SEROQUEL) 50 MG tablet   Oral   Take 1 tablet (50 mg total) by mouth 2 (two) times daily.   4 tablet   0   . traZODone (DESYREL) 50 MG tablet   Oral   Take 1 tablet (50 mg total) by mouth at bedtime as needed for sleep (May repeat x1). For depression/sleep   30  tablet   0    BP 117/68  Pulse 73  Temp(Src) 97.6 F (36.4 C) (Oral)  Resp 16  SpO2 100% Physical Exam  Nursing note and vitals reviewed. Constitutional: She appears well-developed and well-nourished.  HENT:  Head: Normocephalic and atraumatic.  Eyes: Conjunctivae are normal. Right eye exhibits no discharge. Left eye exhibits no discharge.  Neck: Normal range of motion. Neck supple.  Cardiovascular: Normal rate, regular rhythm and normal heart sounds.   Pulmonary/Chest: Effort normal and breath sounds normal.  Abdominal: Soft. There is no tenderness.  Neurological: She is alert.  Skin: Skin is warm and dry.  Psychiatric: She has a normal mood and affect. Her speech is normal. She is hyperactive. Thought content is delusional. She expresses no homicidal and no suicidal ideation.    ED Course  Procedures (including critical care time) DIAGNOSTIC STUDIES: Oxygen Saturation is 100% on room air, normal by my interpretation.    COORDINATION OF CARE: 4:46 PM Discussed course of care with pt. Pt understands and agrees. Labs Review Labs Reviewed  CBC - Abnormal; Notable for the following:    RBC 5.14 (*)    Hemoglobin 15.1 (*)    All other components within normal limits  COMPREHENSIVE METABOLIC PANEL - Abnormal; Notable for the following:    Glucose, Bld 133 (*)    Albumin 3.2 (*)    Total Bilirubin 0.1 (*)    GFR calc non Af Amer 77 (*)    GFR calc Af Amer 90 (*)    All other components within normal limits  SALICYLATE LEVEL - Abnormal; Notable for the following:    Salicylate Lvl <2.0 (*)    All other components within normal limits  URINE RAPID DRUG SCREEN (HOSP PERFORMED) - Abnormal; Notable for the following:    Cocaine POSITIVE (*)    Tetrahydrocannabinol POSITIVE (*)    All other components within normal limits  ACETAMINOPHEN LEVEL  ETHANOL  POCT PREGNANCY, URINE   Imaging Review No results found.  Vital signs reviewed and are as follows: Filed Vitals:    09/30/13 1630  BP: 117/68  Pulse: 73  Temp: 97.6 F (36.4 C)  Resp: 16   6:23 PM Labs reviewed. Patient is medically cleared.  Will contact [facility]  8:28 PM Awaiting clearance to send patient to [facility].    MDM   1. Delusions   2. Bipolar disorder   3. Cocaine dependence    Pending admission to [facility].   I personally performed the services described in this documentation, which was scribed in my presence. The  recorded information has been reviewed and is accurate.    Renne Crigler, PA-C 09/30/13 2029

## 2013-09-30 NOTE — ED Notes (Signed)
[facility] notified regarding transport.

## 2013-10-01 DIAGNOSIS — F1994 Other psychoactive substance use, unspecified with psychoactive substance-induced mood disorder: Secondary | ICD-10-CM

## 2013-10-01 DIAGNOSIS — F1920 Other psychoactive substance dependence, uncomplicated: Secondary | ICD-10-CM

## 2013-10-01 DIAGNOSIS — F3130 Bipolar disorder, current episode depressed, mild or moderate severity, unspecified: Secondary | ICD-10-CM

## 2013-10-01 DIAGNOSIS — F1910 Other psychoactive substance abuse, uncomplicated: Secondary | ICD-10-CM

## 2013-10-01 NOTE — [facility] Group Notes (Signed)
[facility] LCSW Group Therapy  Emotional Regulation 1:15 - 2: 30 PM        10/01/2013  3:38 PM   Type of Therapy:  Group Therapy  Participation Level: Did not attend group.  [name] 10/01/2013   3:38 PM

## 2013-10-01 NOTE — [facility] Group Notes (Signed)
[facility] LCSW Aftercare Discharge Planning Group Note   10/01/2013 9:40 AM  Participation Quality:  Did not attend.   [name], [name]

## 2013-10-01 NOTE — Progress Notes (Signed)
Adult Psychoeducational Group Note  Date:  10/01/2013 Time:  11:00am Group Topic/Focus:  Personal Choices and Values:   The focus of this group is to help patients assess and explore the importance of values in their lives, how their values affect their decisions, how they express their values and what opposes their expression.  Participation Level:  Did Not Attend  Participation Quality:    Affect:   Cognitive:    Insight:   Engagement in Group:    Modes of Intervention:    Additional Comments: Pt did not attend group  Shelly Bombard D 10/01/2013, 12:06 PM

## 2013-10-01 NOTE — Tx Team (Signed)
Interdisciplinary Treatment Plan Update   Date Reviewed:  10/01/2013  Time Reviewed:  9:41 AM  Progress in Treatment:   Attending groups: Yes Participating in groups: Yes Taking medication as prescribed: Yes  Tolerating medication: Yes Family/Significant other contact made: No, but will ask patient for consent for collateral contact Patient understands diagnosis: Yes  Discussing patient identified problems/goals with staff: Yes Medical problems stabilized or resolved: Yes Denies suicidal/homicidal ideation: Yes Patient has not harmed self or others: Yes  For review of initial/current patient goals, please see plan of care.  Estimated Length of Stay:  3-5 days  Reasons for Continued Hospitalization:  Anxiety Depression Medication stabilization Suicidal ideation  New Problems/Goals identified:    Discharge Plan or Barriers:   Home with outpatient follow up  Additional Comments:    [name] is an 52 y.o. female presents to [facility] voluntarily due to increasing AVH, pt said "I'm hearing voices and they telling me to hurt myself". Pt reports that she is seeing shapes and shadows and said "I need to come in the hospital, cuz I been out of my medicine for about 2 weeks". Pt denies HI, Delusions or Psychosis, and appears to be responding to internal stimuli and said "some people are after me".    Attendees:  Patient:  10/01/2013 9:41 AM   Signature: Mervyn Gay, MD 10/01/2013 9:41 AM  Signature:  Verne Spurr, PA 10/01/2013 9:41 AM  Signature: Harold Barban, RN 10/01/2013 9:41 AM  Signature: Nestor Ramp, RN 10/01/2013 9:41 AM  Signature:  Elliot Cousin,  RN 10/01/2013 9:41 AM  Signature:  Juline Patch, LCSW 10/01/2013 9:41 AM  Signature:  Reyes Ivan, LCSW 10/01/2013 9:41 AM  Signature:  Sharin Grave Coordinator 10/01/2013 9:41 AM  Signature:  10/01/2013 9:41 AM  Signature: 10/01/2013  9:41 AM  Signature:    Signature:      Scribe for Treatment Team:   Juline Patch,  10/01/2013 9:41 AM

## 2013-10-01 NOTE — Progress Notes (Signed)
Adult Psychoeducational Group Note  Date:  10/01/2013 Time:  10:02 PM  Group Topic/Focus:  Wrap-Up Group:   The focus of this group is to help patients review their daily goal of treatment and discuss progress on daily workbooks.  Participation Level:  Active  Participation Quality:  Appropriate  Affect:  Appropriate  Cognitive:  Appropriate  Insight: Appropriate  Engagement in Group:  Engaged  Modes of Intervention:  Support  Additional Comments:  Pt stated that this was her first group for the day and that she was finally able to get a good nights rest. Plans to attend all groups from here out and to stay positive. Pt was  Given support from staff  Emiel Kielty 10/01/2013, 10:02 PM

## 2013-10-01 NOTE — [facility] Suicide Risk Assessment (Signed)
Suicide Risk Assessment  Admission Assessment     Nursing information obtained from:  Patient Demographic factors:  Low socioeconomic status;Unemployed Current Mental Status:  NA Loss Factors:  NA Historical Factors:  NA Risk Reduction Factors:  Living with another person, especially a relative;Positive therapeutic relationship  CLINICAL FACTORS:   Severe Anxiety and/or Agitation Bipolar Disorder:   Mixed State Depression:   Anhedonia Comorbid alcohol abuse/dependence Hopelessness Impulsivity Insomnia Recent sense of peace/wellbeing Severe Alcohol/Substance Abuse/Dependencies Chronic Pain More than one psychiatric diagnosis Unstable or Poor Therapeutic Relationship Previous Psychiatric Diagnoses and Treatments Medical Diagnoses and Treatments/Surgeries  COGNITIVE FEATURES THAT CONTRIBUTE TO RISK:  Closed-mindedness Loss of executive function Polarized thinking Thought constriction (tunnel vision)    SUICIDE RISK:   Moderate:  Frequent suicidal ideation with limited intensity, and duration, some specificity in terms of plans, no associated intent, good self-control, limited dysphoria/symptomatology, some risk factors present, and identifiable protective factors, including available and accessible social support.  PLAN OF CARE: admitted for bipolar disorder and polysubstance abuse.   I certify that inpatient services furnished can reasonably be expected to improve the patient's condition.  Dolce Sylvia,JANARDHAHA R. 10/01/2013, 4:52 PM

## 2013-10-01 NOTE — Progress Notes (Addendum)
Recreation Therapy Notes  Date: 10.08.2014 Time: 3:00pmam  Location: [address] Dayroom  Group Topic: Primary: Safety, Secondary: Communication, Teamwork.  Goal Area(s) Addresses:  Patient will successfully work independently to determine survival needs. Patient with successfully work with peers to determine survival needs.   Patient will relate activity to personal safety.   Behavioral Response: Did not attend  Fallan Mccarey L Dodge Ator, LRT/CTRS  Keilana Morlock L 10/01/2013 3:50 PM 

## 2013-10-01 NOTE — H&P (Signed)
Psychiatric Admission Assessment Adult  Patient Identification:  [name] Date of Evaluation:  10/01/2013 Chief Complaint:  MAJOR DEPRESSIVE DISORDER,RECURRENT,SEVERE History of Present Illness: [name] is an 52 y.o. female admitted voluntarily and emergently from [facility] assessment for bipolar disorder at the noncompliant with medications and increased his abuse of cocaine and marijuana presented with increasing auditory and visual hallucinations. Patient said "I'm hearing voices and they telling me to hurt myself". She is seeing shapes and shadows and said "I need to come in the hospital, because I have been out of my medicine for about 2 weeks". Patient appears to be responding to internal stimuli and P[place]my[place], patient said "some people are after me". Patient reports numerous admissions for suicide attempts since 1979 and said "I haven't tried that since 1999, it's been a long time". Patient confirms that she "smoked crack sometime last week, had some beer yesterday and smoked cigarettes today". She has a HA that has lasted 2 days, yet she has been up without sleep for the last 3 days. Patient has decreased appetite, and concentration is decreased, recent memory is impaired/remote memory is intact. She is currently receiving services through [facility] and "my last appointment was last month, in the early part". Patient is in drug screen is positive for cocaine and tetrahydrocannabinol.  Elements:  Location:  [facility] Adult unit. Quality:  Bipolar disorder and substance abuse. Severity:  Hallucinations and suicidal ideations. Timing:  Drug relapse. Duration:  One week. Context:  All of the above. Associated Signs/Synptoms: Depression Symptoms:  depressed mood, insomnia, psychomotor agitation, feelings of worthlessness/guilt, difficulty concentrating, hopelessness, suicidal thoughts with specific plan, weight loss, decreased labido, decreased appetite, (Hypo) Manic Symptoms:   Distractibility, Impulsivity, Irritable Mood, Anxiety Symptoms:  Excessive Worry, Psychotic Symptoms:  Paranoia, PTSD Symptoms: NA  Psychiatric Specialty Exam: Physical Exam  ROS  Blood pressure 122/73, pulse 84, temperature 97.2 F (36.2 C), temperature source Oral, resp. rate 16, height 5' (1.524 m), weight 57.607 kg (127 lb).Body mass index is 24.8 kg/(m^2).  General Appearance: Disheveled and Guarded  Eye Contact::  Minimal  Speech:  Clear and Coherent  Volume:  Decreased  Mood:  Angry, Anxious and Depressed  Affect:  Depressed and Flat  Thought Process:  Goal Directed and Intact  Orientation:  Full (Time, Place, and Person)  Thought Content:  Paranoid Ideation  Suicidal Thoughts:  Yes.  with intent/plan  Homicidal Thoughts:  No  Memory:  Immediate;   Fair  Judgement:  Impaired  Insight:  Lacking  Psychomotor Activity:  Restlessness  Concentration:  Fair  Recall:  Fair  Akathisia:  NA  Handed:  Right  AIMS (if indicated):     Assets:  Communication Skills Desire for Improvement Physical Health Resilience Social Support  Sleep:  Number of Hours: 6.75    Past Psychiatric History: Diagnosis: Bipolar disorder and polysubstance abuse   Hospitalizations: Multiple   Outpatient Care: [facility]   Substance Abuse Care: Yes   Self-Mutilation: No   Suicidal Attempts: Yes   Violent Behaviors: No    Past Medical History:   Past Medical History  Diagnosis Date  . Depression   . Bipolar 1 disorder   . Drug abuse    None. Allergies:  No Known Allergies PTA Medications: Prescriptions prior to admission  Medication Sig Dispense Refill  . Multiple Vitamin (MULTIVITAMIN WITH MINERALS) TABS tablet Take 1 tablet by mouth daily.      . QUEtiapine (SEROQUEL) 400 MG tablet Take 400 mg by mouth at bedtime.      Marland Kitchen  QUEtiapine (SEROQUEL) 50 MG tablet Take 50 mg by mouth 2 (two) times daily.        Previous Psychotropic Medications:  Medication/Dose  Seroquel   Trazodone               Substance Abuse History in the last 12 months:  yes  Consequences of Substance Abuse: NA  Social History:  reports that she has been smoking Cigarettes.  She has a 5 pack-year smoking history. She has never used smokeless tobacco. She reports that she drinks about 15.0 ounces of alcohol per week. She reports that she uses illicit drugs ("Crack" cocaine and Marijuana). 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 Legal History: Hobbies/Interests:  Family History:   Family History  Problem Relation Age of Onset  . Anesthesia problems Neg Hx   . Hypotension Neg Hx   . Malignant hyperthermia Neg Hx   . Pseudochol deficiency Neg Hx     Results for orders placed during the hospital encounter of 09/30/13 (from the past 72 hour(s))  ACETAMINOPHEN LEVEL     Status: None   Collection Time    09/30/13  4:55 PM      Result Value Range   Acetaminophen (Tylenol), Serum <15.0  10 - 30 ug/mL   Comment:            THERAPEUTIC CONCENTRATIONS VARY     SIGNIFICANTLY. A RANGE OF 10-30     ug/mL MAY BE AN EFFECTIVE     CONCENTRATION FOR MANY PATIENTS.     HOWEVER, SOME ARE BEST TREATED     AT CONCENTRATIONS OUTSIDE THIS     RANGE.     ACETAMINOPHEN CONCENTRATIONS     >150 ug/mL AT 4 HOURS AFTER     INGESTION AND >50 ug/mL AT 12     HOURS AFTER INGESTION ARE     OFTEN ASSOCIATED WITH TOXIC     REACTIONS.  CBC     Status: Abnormal   Collection Time    09/30/13  4:55 PM      Result Value Range   WBC 7.5  4.0 - 10.5 K/uL   RBC 5.14 (*) 3.87 - 5.11 MIL/uL   Hemoglobin  15.1 (*) 12.0 - 15.0 g/dL   HCT 81.1  91.4 - 78.2 %   MCV 88.1  78.0 - 100.0 fL   MCH 29.4  26.0 - 34.0 pg   MCHC 33.3  30.0 - 36.0 g/dL   RDW 95.6  21.3 - 08.6 %   Platelets 307  150 - 400 K/uL  COMPREHENSIVE METABOLIC PANEL     Status: Abnormal   Collection Time    09/30/13  4:55 PM      Result Value Range   Sodium 140  135 - 145 mEq/L   Potassium 3.9  3.5 - 5.1 mEq/L   Chloride 107  96 - 112 mEq/L   CO2 23  19 - 32 mEq/L   Glucose, Bld 133 (*) 70 - 99 mg/dL   BUN 15  6 - 23 mg/dL   Creatinine, Ser 5.78  0.50 - 1.10 mg/dL  Calcium 9.4  8.4 - 10.5 mg/dL   Total Protein 6.5  6.0 - 8.3 g/dL   Albumin 3.2 (*) 3.5 - 5.2 g/dL   AST 21  0 - 37 U/L   ALT 18  0 - 35 U/L   Alkaline Phosphatase 60  39 - 117 U/L   Total Bilirubin 0.1 (*) 0.3 - 1.2 mg/dL   GFR calc non Af Amer 77 (*) >90 mL/min   GFR calc Af Amer 90 (*) >90 mL/min   Comment: (NOTE)     The eGFR has been calculated using the CKD EPI equation.     This calculation has not been validated in all clinical situations.     eGFR's persistently <90 mL/min signify possible Chronic Kidney     Disease.  ETHANOL     Status: None   Collection Time    09/30/13  4:55 PM      Result Value Range   Alcohol, Ethyl (B) <11  0 - 11 mg/dL   Comment:            LOWEST DETECTABLE LIMIT FOR     SERUM ALCOHOL IS 11 mg/dL     FOR MEDICAL PURPOSES ONLY  SALICYLATE LEVEL     Status: Abnormal   Collection Time    09/30/13  4:55 PM      Result Value Range   Salicylate Lvl <2.0 (*) 2.8 - 20.0 mg/dL  URINE RAPID DRUG SCREEN (HOSP PERFORMED)     Status: Abnormal   Collection Time    09/30/13  4:56 PM      Result Value Range   Opiates NONE DETECTED  NONE DETECTED   Cocaine POSITIVE (*) NONE DETECTED   Benzodiazepines NONE DETECTED  NONE DETECTED   Amphetamines NONE DETECTED  NONE DETECTED   Tetrahydrocannabinol POSITIVE (*) NONE DETECTED   Barbiturates NONE DETECTED  NONE DETECTED   Comment:            DRUG SCREEN FOR MEDICAL PURPOSES      ONLY.  IF CONFIRMATION IS NEEDED     FOR ANY PURPOSE, NOTIFY LAB     WITHIN 5 DAYS.                LOWEST DETECTABLE LIMITS     FOR URINE DRUG SCREEN     Drug Class       Cutoff (ng/mL)     Amphetamine      1000     Barbiturate      200     Benzodiazepine   200     Tricyclics       300     Opiates          300     Cocaine          300     THC              50  POCT PREGNANCY, URINE     Status: None   Collection Time    09/30/13  4:59 PM      Result Value Range   Preg Test, Ur NEGATIVE  NEGATIVE   Comment:            THE SENSITIVITY OF THIS     METHODOLOGY IS >24 mIU/mL   Psychological Evaluations:  Assessment:   DSM5:  Schizophrenia Disorders:   Obsessive-Compulsive Disorders:   Trauma-Stressor Disorders:   Substance/Addictive Disorders:  Cannabis Use Disorder - Severe (304.30) Depressive Disorders:  Major Depressive Disorder -  Unspecified (296.20)  AXIS I:  Bipolar, Depressed, Substance Induced Mood Disorder and Polysubstance abuse versus dependence AXIS II:  Deferred AXIS III:   Past Medical History  Diagnosis Date  . Depression   . Bipolar 1 disorder   . Drug abuse    AXIS IV:  economic problems, housing problems, occupational problems, other psychosocial or environmental problems, problems related to social environment and problems with primary support group AXIS V:  41-50 serious symptoms  Treatment Plan/Recommendations:  Admit for crisis stabilization, safety monitoring and medication management  Treatment Plan Summary: Daily contact with patient to assess and evaluate symptoms and progress in treatment Medication management Current Medications:  Current Facility-Administered Medications  Medication Dose Route Frequency Provider Last Rate Last Dose  . acetaminophen (TYLENOL) tablet 650 mg  650 mg Oral Q6H PRN Evanna Janann August, NP      . alum & mag hydroxide-simeth (MAALOX/MYLANTA) 200-200-20 MG/5ML suspension 30 mL  30 mL Oral Q4H PRN Evanna Janann August, NP      . magnesium hydroxide (MILK OF MAGNESIA) suspension 30 mL  30 mL Oral Daily PRN Evanna Janann August, NP      . multivitamin with minerals tablet 1 tablet  1 tablet Oral Daily Evanna Janann August, NP   1 tablet at 09/30/13 2104  . QUEtiapine (SEROQUEL) tablet 400 mg  400 mg Oral QHS Audrea Muscat, NP   400 mg at 09/30/13 2104  . traZODone (DESYREL) tablet 50 mg  50 mg Oral QHS PRN,MR X 1 Evanna Janann August, NP        Observation Level/Precautions:  15 minute checks  Laboratory:  Reviewed admission labs  Psychotherapy:  Group therapy, milieu therapy and substance abuse counseling   Medications:  Seroquel and trazodone   Consultations:  None   Discharge concerns: safety  Estimated LOS: 4-7 days  Other:     I certify that inpatient services furnished can reasonably be expected to improve the patient's condition.   [place] Riesen,JANARDHAHA R. 10/8/20144:53 PM

## 2013-10-01 NOTE — Progress Notes (Signed)
D: Patient is irritable and uncooperative with staff at this time. Patient's affect/mood is blunted and depressed. She refused morning medication. Writer observed that the patient has been lying in bed the majority of the day, besides going to meals. The patient has not attended any groups today.  A: Support and encouragement provided to patient. Attempted x3 to administer medication to patient per ordering MD. Monitor Q15 minute checks for safety.  R: Patient receptive. Denies SI/HI/AVH. Patient remains safe on the unit.

## 2013-10-02 DIAGNOSIS — F3160 Bipolar disorder, current episode mixed, unspecified: Principal | ICD-10-CM

## 2013-10-02 MED ORDER — ENSURE COMPLETE PO LIQD
237.0000 mL | Freq: Two times a day (BID) | ORAL | Status: DC
  Administered 2013-10-02 – 2013-10-05 (×5): 237 mL via ORAL

## 2013-10-02 MED ORDER — QUETIAPINE FUMARATE 50 MG PO TABS
50.0000 mg | ORAL_TABLET | Freq: Two times a day (BID) | ORAL | Status: DC
  Administered 2013-10-02 – 2013-10-06 (×8): 50 mg via ORAL
  Filled 2013-10-02: qty 1
  Filled 2013-10-02 (×2): qty 28
  Filled 2013-10-02 (×2): qty 1
  Filled 2013-10-02: qty 28
  Filled 2013-10-02: qty 1
  Filled 2013-10-02: qty 28
  Filled 2013-10-02 (×2): qty 1
  Filled 2013-10-02 (×2): qty 28

## 2013-10-02 NOTE — Progress Notes (Signed)
D: Patient appropriate and cooperative with staff. Patient's affect is anxious and mood is depressed. She reported on the self inventory sheet that her sleep is fair, appetite is good, energy level is normal and ability to pay attention is improving. Patient rated depression and feelings of hopelessness "5". Continues to not participate in groups. Compliant with current medication regimen.  A: Support and encouragement provided to patient. Administered scheduled medications per ordering MD. Monitor Q15 minute checks for safety.  R: Patient receptive. Passive SI, but contracts for safety. Denies HI. Patient remains safe on the unit.

## 2013-10-02 NOTE — Progress Notes (Signed)
[facility] MD Progress Note  10/02/2013 10:55 AM [name]  MRN:  [id number] Subjective:  Patient has complained of headache and asked to check if she has UTI or Pregnant. She has fishy smell while urinating and has been lactating. She says her breast is getting bigger. She has family history of breast cancer and does not believe she has any lumps and or breast cancer. She has requested her home medication seroquel during day time in addition night time for helping her auditory hallucination, mania and insomnia. She has also requested ensure and vitamins for general health.  Diagnosis:   DSM5: Schizophrenia Disorders:   Obsessive-Compulsive Disorders:   Trauma-Stressor Disorders:   Substance/Addictive Disorders:   Depressive Disorders:    Axis I: Bipolar, mixed, Substance Induced Mood Disorder and polysubstance abuse  ADL's:  Impaired  Sleep: Poor  Appetite:  Fair  Suicidal Ideation:  denied Homicidal Ideation:  denied AEB (as evidenced by):  Psychiatric Specialty Exam: ROS  Blood pressure 129/74, pulse 82, temperature 98 F (36.7 C), temperature source Oral, resp. rate 18, height 5' (1.524 m), weight 57.607 kg (127 lb).Body mass index is 24.8 kg/(m^2).  General Appearance: Fairly Groomed and Guarded  Patent attorney::  Fair  Speech:  Clear and Coherent  Volume:  Normal  Mood:  Angry, Anxious and Depressed  Affect:  Depressed and Labile  Thought Process:  Goal Directed and Intact  Orientation:  Full (Time, Place, and Person)  Thought Content:  Hallucinations: Auditory Visual  Suicidal Thoughts:  Yes.  without intent/plan  Homicidal Thoughts:  No  Memory:  Immediate;   Fair  Judgement:  Impaired  Insight:  Lacking  Psychomotor Activity:  Restlessness  Concentration:  Fair  Recall:  Fair  Akathisia:  NA  Handed:  Right  AIMS (if indicated):     Assets:  Communication Skills Desire for Improvement Physical Health Resilience Social Support  Sleep:  Number of Hours:  6.75   Current Medications: Current Facility-Administered Medications  Medication Dose Route Frequency Provider Last Rate Last Dose  . acetaminophen (TYLENOL) tablet 650 mg  650 mg Oral Q6H PRN Evanna Janann August, NP      . alum & mag hydroxide-simeth (MAALOX/MYLANTA) 200-200-20 MG/5ML suspension 30 mL  30 mL Oral Q4H PRN Evanna Janann August, NP      . feeding supplement (ENSURE COMPLETE) (ENSURE COMPLETE) liquid 237 mL  237 mL Oral BID BM Nehemiah Settle, MD      . magnesium hydroxide (MILK OF MAGNESIA) suspension 30 mL  30 mL Oral Daily PRN Evanna Janann August, NP      . multivitamin with minerals tablet 1 tablet  1 tablet Oral Daily Evanna Janann August, NP   1 tablet at 10/02/13 0752  . QUEtiapine (SEROQUEL) tablet 400 mg  400 mg Oral QHS Audrea Muscat, NP   400 mg at 10/01/13 2058  . QUEtiapine (SEROQUEL) tablet 50 mg  50 mg Oral BID Nehemiah Settle, MD        Lab Results:  Results for orders placed during the hospital encounter of 09/30/13 (from the past 48 hour(s))  ACETAMINOPHEN LEVEL     Status: None   Collection Time    09/30/13  4:55 PM      Result Value Range   Acetaminophen (Tylenol), Serum <15.0  10 - 30 ug/mL   Comment:            THERAPEUTIC CONCENTRATIONS VARY     SIGNIFICANTLY. A RANGE OF 10-30  ug/mL MAY BE AN EFFECTIVE     CONCENTRATION FOR MANY PATIENTS.     HOWEVER, SOME ARE BEST TREATED     AT CONCENTRATIONS OUTSIDE THIS     RANGE.     ACETAMINOPHEN CONCENTRATIONS     >150 ug/mL AT 4 HOURS AFTER     INGESTION AND >50 ug/mL AT 12     HOURS AFTER INGESTION ARE     OFTEN ASSOCIATED WITH TOXIC     REACTIONS.  CBC     Status: Abnormal   Collection Time    09/30/13  4:55 PM      Result Value Range   WBC 7.5  4.0 - 10.5 K/uL   RBC 5.14 (*) 3.87 - 5.11 MIL/uL   Hemoglobin 15.1 (*) 12.0 - 15.0 g/dL   HCT 40.9  81.1 - 91.4 %   MCV 88.1  78.0 - 100.0 fL   MCH 29.4  26.0 - 34.0 pg   MCHC 33.3  30.0 - 36.0 g/dL   RDW 78.2  95.6 -  21.3 %   Platelets 307  150 - 400 K/uL  COMPREHENSIVE METABOLIC PANEL     Status: Abnormal   Collection Time    09/30/13  4:55 PM      Result Value Range   Sodium 140  135 - 145 mEq/L   Potassium 3.9  3.5 - 5.1 mEq/L   Chloride 107  96 - 112 mEq/L   CO2 23  19 - 32 mEq/L   Glucose, Bld 133 (*) 70 - 99 mg/dL   BUN 15  6 - 23 mg/dL   Creatinine, Ser 0.86  0.50 - 1.10 mg/dL   Calcium 9.4  8.4 - 57.8 mg/dL   Total Protein 6.5  6.0 - 8.3 g/dL   Albumin 3.2 (*) 3.5 - 5.2 g/dL   AST 21  0 - 37 U/L   ALT 18  0 - 35 U/L   Alkaline Phosphatase 60  39 - 117 U/L   Total Bilirubin 0.1 (*) 0.3 - 1.2 mg/dL   GFR calc non Af Amer 77 (*) >90 mL/min   GFR calc Af Amer 90 (*) >90 mL/min   Comment: (NOTE)     The eGFR has been calculated using the CKD EPI equation.     This calculation has not been validated in all clinical situations.     eGFR's persistently <90 mL/min signify possible Chronic Kidney     Disease.  ETHANOL     Status: None   Collection Time    09/30/13  4:55 PM      Result Value Range   Alcohol, Ethyl (B) <11  0 - 11 mg/dL   Comment:            LOWEST DETECTABLE LIMIT FOR     SERUM ALCOHOL IS 11 mg/dL     FOR MEDICAL PURPOSES ONLY  SALICYLATE LEVEL     Status: Abnormal   Collection Time    09/30/13  4:55 PM      Result Value Range   Salicylate Lvl <2.0 (*) 2.8 - 20.0 mg/dL  URINE RAPID DRUG SCREEN (HOSP PERFORMED)     Status: Abnormal   Collection Time    09/30/13  4:56 PM      Result Value Range   Opiates NONE DETECTED  NONE DETECTED   Cocaine POSITIVE (*) NONE DETECTED   Benzodiazepines NONE DETECTED  NONE DETECTED   Amphetamines NONE DETECTED  NONE DETECTED   Tetrahydrocannabinol POSITIVE (*)  NONE DETECTED   Barbiturates NONE DETECTED  NONE DETECTED   Comment:            DRUG SCREEN FOR MEDICAL PURPOSES     ONLY.  IF CONFIRMATION IS NEEDED     FOR ANY PURPOSE, NOTIFY LAB     WITHIN 5 DAYS.                LOWEST DETECTABLE LIMITS     FOR URINE DRUG SCREEN      Drug Class       Cutoff (ng/mL)     Amphetamine      1000     Barbiturate      200     Benzodiazepine   200     Tricyclics       300     Opiates          300     Cocaine          300     THC              50  POCT PREGNANCY, URINE     Status: None   Collection Time    09/30/13  4:59 PM      Result Value Range   Preg Test, Ur NEGATIVE  NEGATIVE   Comment:            THE SENSITIVITY OF THIS     METHODOLOGY IS >24 mIU/mL    Physical Findings: AIMS: Facial and Oral Movements Muscles of Facial Expression: None, normal Lips and Perioral Area: None, normal Jaw: None, normal Tongue: None, normal,Extremity Movements Upper (arms, wrists, hands, fingers): None, normal Lower (legs, knees, ankles, toes): None, normal, Trunk Movements Neck, shoulders, hips: None, normal, Overall Severity Severity of abnormal movements (highest score from questions above): None, normal Incapacitation due to abnormal movements: None, normal Patient's awareness of abnormal movements (rate only patient's report): No Awareness, Dental Status Current problems with teeth and/or dentures?: No Does patient usually wear dentures?: No  CIWA:  CIWA-Ar Total: 0 COWS:  COWS Total Score: 0  Treatment Plan Summary: Daily contact with patient to assess and evaluate symptoms and progress in treatment Medication management  Plan: Stay with her daughter's friends, recently her BF went to jail and she was relocated Treatment Plan/Recommendations:   1. Admit for crisis management and stabilization. 2. Medication management to reduce current symptoms to base line and improve the patient's overall level of functioning. 3. Treat health problems as indicated. 4. Develop treatment plan to decrease risk of relapse upon discharge and to reduce the need for readmission. 5. Psycho-social education regarding relapse prevention and self care. 6. Health care follow up as needed for medical problems. 7. Restart home medications where  appropriate. 8. LOS 3 days.  Medical Decision Making Problem Points:  Established problem, worsening (2), New problem, with no additional work-up planned (3) and Review of last therapy session (1) Data Points:  Review or order clinical lab tests (1) Review or order medicine tests (1) Review of medication regiment & side effects (2) Review of new medications or change in dosage (2)  I certify that inpatient services furnished can reasonably be expected to improve the patient's condition.   Zamyah Wiesman,JANARDHAHA R. 10/02/2013, 10:55 AM

## 2013-10-02 NOTE — [facility] Counselor (Signed)
Adult Psychosocial Assessment Update Interdisciplinary Team  Previous [facility] admissions/discharges:  Admissions Discharges  Date:  03/02/13 Date:  03/12/13  Date: Date:  Date: Date:  Date: Date:  Date: Date:   Changes since the last Psychosocial Assessment (including adherence to outpatient mental health and/or substance abuse treatment, situational issues contributing to decompensation and/or relapse). Patient reports having been off medications for two weeks.  She endorses SI, A/VH and increased depression.  Patient advised she had used $50 cocaine on one ocassion.             Discharge Plan 1. Will you be returning to the same living situation after discharge?   Yes: No:      If no, what is your plan?    Yes, patient will return to her home.       2. Would you like a referral for services when you are discharged? Yes:     If yes, for what services?  No:       Yes, patients to be seen at [facility].       Summary and Recommendations (to be completed by the evaluator) [name] is a 52 years old African American female admitted with Major Depression Disorder.  She will benefit from crisis stabilization, evaluation for medication, psycho-education groups for coping skills development, group therapy and case management for discharge planning.                        Signature:  [name], 10/02/2013 9:55 AM

## 2013-10-02 NOTE — [facility] Suicide Risk Assessment (Signed)
[facility] INPATIENT:  Family/Significant Other Suicide Prevention Education  Suicide Prevention Education:  Patient Refusal for Family/Significant Other Suicide Prevention Education: The patient Tonantzin Mimnaugh has refused to provide written consent for family/significant other to be provided Family/Significant Other Suicide Prevention Education during admission and/or prior to discharge.  Physician notified.  Patient advised of no family involvement since daughter relocated to another state.  Kavin Weckwerth Hairston 10/02/2013, 10:02 AM

## 2013-10-02 NOTE — Progress Notes (Signed)
Patient ID: [name], female   DOB: 1961/10/13, 52 y.o.   MRN: [id number]  D: Pt is still intrusive, walking between the writer and another pt during a conversation. Pt asked the writer for seroquel and was informed that she could get it at 2100. When asked pt stated she was "feeling better today, and needed one day of rest."  Pt had no other concerns or questions.  A:  Support and encouragement was offered. 15 min checks continued for safety.  R: Pt remains safe.

## 2013-10-02 NOTE — [facility] Group Notes (Signed)
The focus of this group is to educate the patient on the purpose and policies of crisis stabilization and provide a format to answer questions about their admission.  The group details unit policies and expectations of patients while admitted.  Quiet and attentive during group. 

## 2013-10-02 NOTE — [facility] Group Notes (Signed)
[facility] LCSW Group Therapy  10/02/2013  1:15 PM   Type of Therapy:  Group Therapy  Participation Level:  Did Not Attend - Mental Health Association speaker  Lauralyn Primes 10/02/2013 2:53 PM

## 2013-10-02 NOTE — Progress Notes (Signed)
Recreation Therapy Notes  Date: 10.09.2014 Time: 2:45pm Location: [address] Day[address]  Group Topic: Animal Assisted Activities (AAA)  Behavioral Response: Did not attend   Fielding Mault L Maple Odaniel, LRT/CTRS  Jessiah Steinhart L 10/02/2013 4:43 PM 

## 2013-10-02 NOTE — Progress Notes (Signed)
Adult Psychoeducational Group Note  Date:  10/02/2013 Time:  11:00am Group Topic/Focus:  Building Self Esteem:   The Focus of this group is helping patients become aware of the effects of self-esteem on their lives, the things they and others do that enhance or undermine their self-esteem, seeing the relationship between their level of self-esteem and the choices they make and learning ways to enhance self-esteem.  Participation Level:  Did Not Attend  Participation Quality:    Affect:    Cognitive:    Insight:   Engagement in Group:    Modes of Intervention:    Additional Comments: Pt did not attend group.  Shelly Bombard D 10/02/2013, 2:52 PM

## 2013-10-03 LAB — PROLACTIN: Prolactin: 14.1 ng/mL

## 2013-10-03 MED ORDER — QUETIAPINE FUMARATE 400 MG PO TABS: 400.0000 mg | ORAL_TABLET | Freq: Every day | ORAL | Status: DC

## 2013-10-03 MED ORDER — ADULT MULTIVITAMIN W/MINERALS CH: 1.0000 | ORAL_TABLET | Freq: Every day | ORAL | Status: DC

## 2013-10-03 MED ORDER — QUETIAPINE FUMARATE 50 MG PO TABS: 50.0000 mg | ORAL_TABLET | Freq: Two times a day (BID) | ORAL | Status: DC

## 2013-10-03 NOTE — Tx Team (Signed)
Interdisciplinary Treatment Plan Update   Date Reviewed:  10/03/2013  Time Reviewed:  12:26 PM  Progress in Treatment:   Attending groups: Yes Participating in groups: Yes Taking medication as prescribed: Yes  Tolerating medication: Yes Family/Significant other contact made: No, patient declined collateral contact Patient understands diagnosis: Yes  Discussing patient identified problems/goals with staff: Yes Medical problems stabilized or resolved: Yes Denies suicidal/homicidal ideation: Yes Patient has not harmed self or others: Yes  For review of initial/current patient goals, please see plan of care.  Estimated Length of Stay: Discharge today.  Reasons for Continued Hospitalization:   New Problems/Goals identified:    Discharge Plan or Barriers:   Home with outpatient follow up with [facility]  Additional Comments:  N/A    Attendees:  Patient: [name] 10/03/2013 12:26 PM   Signature: Mervyn Gay, MD 10/03/2013 12:26 PM  Signature:  Verne Spurr, PA 10/03/2013 12:26 PM  Signature: Harold Barban, RN 10/03/2013 12:26 PM  Signature: Marita Snellen, RN 10/03/2013 12:26 PM  Signature:   10/03/2013 12:26 PM  Signature:  Juline Patch, LCSW 10/03/2013 12:26 PM  Signature:  Reyes Ivan, LCSW 10/03/2013 12:26 PM  Signature:  Sharin Grave Coordinator 10/03/2013 12:26 PM  Signature:  10/03/2013 12:26 PM  Signature: 10/03/2013  12:26 PM  Signature:    Signature:      Scribe for Treatment Team:   Juline Patch,  10/03/2013 12:26 PM

## 2013-10-03 NOTE — Progress Notes (Signed)
D: Patient appropriate and cooperative with staff. Patient's affect/mood is anxious. Declined self inventory sheet. Patient was to d/c today, but the order was cancelled; she verbalized being SI and positive for AVH.  A: Support and encouragement provided to patient. Scheduled medications administered per MD orders. Maintain Q15 minute checks for safety.  R: Patient receptive. Denies HI. Patient remains safe.

## 2013-10-03 NOTE — Progress Notes (Signed)
Patient ID: [name], female   DOB: 1961-11-14, 52 y.o.   MRN: [id number] [facility] MD Progress Note  10/03/2013 10:15 PM [name]  MRN:  [id number] Subjective:  Patient states she is not ready to leave today, she is hearing voices and has not had a full 24 hours worth of her seroquel. She states it does work, but she hasn't been on enough of it at the correct dosage. She states she is having severe anxiety and is overwhelmed that she will relapse if she leaves and still hears voices. Diagnosis:   DSM5: Schizophrenia Disorders:   Obsessive-Compulsive Disorders:   Trauma-Stressor Disorders:   Substance/Addictive Disorders:   Depressive Disorders:    Axis I: Bipolar, mixed, Substance Induced Mood Disorder and polysubstance abuse  ADL's:  Impaired  Sleep: Poor  Appetite:  Fair  Suicidal Ideation:  denied Homicidal Ideation:  denied AEB (as evidenced by):  Psychiatric Specialty Exam: Review of Systems  Constitutional: Negative.  Negative for fever, chills, weight loss, malaise/fatigue and diaphoresis.  HENT: Negative for congestion and sore throat.   Eyes: Negative for blurred vision, double vision and photophobia.  Respiratory: Negative for cough, shortness of breath and wheezing.   Cardiovascular: Negative for chest pain, palpitations and PND.  Gastrointestinal: Negative for heartburn, nausea, vomiting, abdominal pain, diarrhea and constipation.  Musculoskeletal: Negative for falls, joint pain and myalgias.  Neurological: Negative for dizziness, tingling, tremors, sensory change, speech change, focal weakness, seizures, loss of consciousness, weakness and headaches.  Endo/Heme/Allergies: Negative for polydipsia. Does not bruise/bleed easily.  Psychiatric/Behavioral: Negative for depression, suicidal ideas, hallucinations, memory loss and substance abuse. The patient is not nervous/anxious and does not have insomnia.     Blood pressure 123/70, pulse 82, temperature 97.8  F (36.6 C), temperature source Oral, resp. rate 16, height 5' (1.524 m), weight 57.607 kg (127 lb).Body mass index is 24.8 kg/(m^2).  General Appearance: Fairly Groomed and Guarded  Patent attorney::  Fair  Speech:  Clear and Coherent  Volume:  Normal  Mood:  Angry, Anxious and Depressed  Affect:  Depressed and Labile  Thought Process:  Goal Directed and Intact  Orientation:  Full (Time, Place, and Person)  Thought Content:  Hallucinations: Auditory Visual  Suicidal Thoughts:  Yes.  without intent/plan  Homicidal Thoughts:  No  Memory:  Immediate;   Fair  Judgement:  Impaired  Insight:  Lacking  Psychomotor Activity:  Restlessness  Concentration:  Fair  Recall:  Fair  Akathisia:  NA  Handed:  Right  AIMS (if indicated):     Assets:  Communication Skills Desire for Improvement Physical Health Resilience Social Support  Sleep:  Number of Hours: 6.25   Current Medications: Current Facility-Administered Medications  Medication Dose Route Frequency Provider Last Rate Last Dose  . acetaminophen (TYLENOL) tablet 650 mg  650 mg Oral Q6H PRN Evanna Janann August, NP      . alum & mag hydroxide-simeth (MAALOX/MYLANTA) 200-200-20 MG/5ML suspension 30 mL  30 mL Oral Q4H PRN Evanna Janann August, NP      . feeding supplement (ENSURE COMPLETE) (ENSURE COMPLETE) liquid 237 mL  237 mL Oral BID BM Nehemiah Settle, MD   237 mL at 10/03/13 1400  . magnesium hydroxide (MILK OF MAGNESIA) suspension 30 mL  30 mL Oral Daily PRN Evanna Janann August, NP      . multivitamin with minerals tablet 1 tablet  1 tablet Oral Daily Evanna Janann August, NP   1 tablet at 10/03/13 0803  . QUEtiapine (SEROQUEL) tablet  400 mg  400 mg Oral QHS Audrea Muscat, NP   400 mg at 10/03/13 2116  . QUEtiapine (SEROQUEL) tablet 50 mg  50 mg Oral BID Nehemiah Settle, MD   50 mg at 10/03/13 1527    Lab Results:  Results for orders placed during the hospital encounter of 09/30/13 (from the past 48  hour(s))  PROLACTIN     Status: None   Collection Time    10/03/13  6:15 AM      Result Value Range   Prolactin 14.1     Comment: (NOTE)         Reference Ranges:                     Female:                       2.1 -  17.1 ng/ml                     Female:   Pregnant          9.7 - 208.5 ng/mL                               Non Pregnant      2.8 -  29.2 ng/mL                               Post Menopausal   1.8 -  20.3 ng/mL                           Performed at [facility]    Physical Findings: AIMS: Facial and Oral Movements Muscles of Facial Expression: None, normal Lips and Perioral Area: None, normal Jaw: None, normal Tongue: None, normal,Extremity Movements Upper (arms, wrists, hands, fingers): None, normal Lower (legs, knees, ankles, toes): None, normal, Trunk Movements Neck, shoulders, hips: None, normal, Overall Severity Severity of abnormal movements (highest score from questions above): None, normal Incapacitation due to abnormal movements: None, normal Patient's awareness of abnormal movements (rate only patient's report): No Awareness, Dental Status Current problems with teeth and/or dentures?: No Does patient usually wear dentures?: No  CIWA:  CIWA-Ar Total: 0 COWS:  COWS Total Score: 0  Treatment Plan Summary: Daily contact with patient to assess and evaluate symptoms and progress in treatment Medication management  Plan: 1. Discharge is cancelled due to current state of auditory hallucinations.  2. Seroquel is increased to 25mg  po BID. And 400mg  po qhs. Will observe. 3. Patient is also to attend groups and will follow up with medication management for symptom resolution. 4. Anticipate D/C out on Monday. Treatment Plan/Recommendations:    Medical Decision Making Problem Points:  Established problem, worsening (2), New problem, with no additional work-up planned (3) and Review of last therapy session (1) Data Points:  Review or order clinical lab  tests (1) Review or order medicine tests (1) Review of medication regiment & side effects (2) Review of new medications or change in dosage (2)  I certify that inpatient services furnished can reasonably be expected to improve the patient's condition.  Rona Ravens. Mashburn RPAC 10:26 PM 10/03/2013  Reviewed the information documented and agree with the treatment plan.  Drew Lips,JANARDHAHA R. 10/03/2013 11:41 PM

## 2013-10-03 NOTE — Progress Notes (Signed)
[facility] Adult Case Management Discharge Plan :  Will you be returning to the same living situation after discharge: Yes,  Patient is returning to her home. At discharge, do you have transportation home?:Yes,  Patient has transportation Do you have the ability to pay for your medications:Yes,  Patient is able to make co-payment  Release of information consent forms completed and in the chart;  Patient's signature needed at discharge.  Patient to Follow up at: Follow-up Information   Follow up with Dr. Lolly Mustache - [facility] On 10/14/2013. (Tuesday, October 14, 2013 at 4:00)    Contact information:   [address] [place], [place]   [address]  [phone]      Patient denies SI/HI:   Patient no longer endorsing SI/HI or other thoughts of self harm.   Safety Planning and Suicide Prevention discussed: .Reviewed with all patients during discharge planning group   Natalea Sutliff, Joes[place] July 10/03/2013, 12:22 PM

## 2013-10-03 NOTE — [facility] Suicide Risk Assessment (Signed)
Suicide Risk Assessment  Discharge Assessment     Demographic Factors:  Adolescent or young adult, Low socioeconomic status and Unemployed  Mental Status Per Nursing Assessment::   On Admission:  NA  Current Mental Status by Physician: Mental Status Examination: Patient appeared as per his stated age, casually dressed, and fairly groomed, and maintaining good eye contact. Patient has good mood and his affect was constricted. He has normal rate, rhythm, and volume of speech. His thought process is linear and goal directed. Patient has denied suicidal, homicidal ideations, intentions or plans. Patient has no evidence of auditory or visual hallucinations, delusions, and paranoia. Patient has fair insight judgment and impulse control.  Loss Factors: Financial problems/change in socioeconomic status  Historical Factors: Prior suicide attempts and Impulsivity  Risk Reduction Factors:   Sense of responsibility to family, Religious beliefs about death, Living with another person, especially a relative, Positive social support, Positive therapeutic relationship and Positive coping skills or problem solving skills  Continued Clinical Symptoms:  Bipolar Disorder:   Mixed State Alcohol/Substance Abuse/Dependencies  Cognitive Features That Contribute To Risk:  Polarized thinking    Suicide Risk:  Minimal: No identifiable suicidal ideation.  Patients presenting with no risk factors but with morbid ruminations; may be classified as minimal risk based on the severity of the depressive symptoms  Discharge Diagnoses:   AXIS I:  Bipolar, mixed, Substance Induced Mood Disorder and Polysubstance abuse AXIS II:  Deferred AXIS III:   Past Medical History  Diagnosis Date  . Depression   . Bipolar 1 disorder   . Drug abuse    AXIS IV:  economic problems, occupational problems, other psychosocial or environmental problems, problems related to social environment and problems with primary support  group AXIS V:  51-60 moderate symptoms  Plan Of Care/Follow-up recommendations:  Activity:  As tolerated Diet:  Regular  Is patient on multiple antipsychotic therapies at discharge:  No   Has Patient had three or more failed trials of antipsychotic monotherapy by history:  No  Recommended Plan for Multiple Antipsychotic Therapies: NA  Jasmon Graffam,JANARDHAHA R. 10/03/2013, 12:25 PM

## 2013-10-03 NOTE — Progress Notes (Signed)
D: Pt denies SI/HI/AVH. Pt is  cooperative. Pt stated "I need to stay away from certain people,places and things. Pt had minimal interaction with Clinical research associate and     A: Pt was offered support and encouragement. Pt was given scheduled medications. Pt was encourage to attend groups. Q 15 minute checks were done for safety.   R:Pt attends groups and interacts  with peers and staff. Pt is taking medication. Pt receptive to treatment and safety maintained on unit.

## 2013-10-03 NOTE — Progress Notes (Signed)
Patient attended group this evening and was upset and angry because she was supposed to discharge today but it was cancelled because she reported she had to speak up and tell the doctor that she had not been on her medication or here 24 hours. Patient reports passive si and verbally contracts, she reports hearing voices telling her negative comments.Patient reports that her goal is to convince people she needs to be here. Support and encouragement offered, safety maintained on unit, will continue to monitor.

## 2013-10-03 NOTE — Discharge Summary (Signed)
Physician Discharge Summary Note  Patient:  [name] is an 52 y.o., female MRN:  [id number] DOB:  06-27-1961 Patient phone:  [phone] (home)  Patient address:   [address] [place] [address],   Date of Admission:  09/30/2013 Date of Discharge: 10/06/2013  Reason for Admission:  Psychosis and suicidal with plan.  Discharge Diagnoses: Active Problems:   Polysubstance abuse   Cocaine addiction   Cocaine dependence   Bipolar disorder  ROS  DSM5: DSM5:  Schizophrenia Disorders:  Obsessive-Compulsive Disorders:  Trauma-Stressor Disorders:  Substance/Addictive Disorders: Cannabis Use Disorder - Severe (304.30)  Depressive Disorders: Major Depressive Disorder - Unspecified (296.20)  AXIS I: Bipolar, Depressed, Substance Induced Mood Disorder and Polysubstance abuse versus dependence  AXIS II: Deferred  AXIS III:  Past Medical History   Diagnosis  Date   .  Depression    .  Bipolar 1 disorder    .  Drug abuse     AXIS IV: economic problems, housing problems, occupational problems, other psychosocial or environmental problems, problems related to social environment and problems with primary support group  AXIS V: 41-50 serious symptoms     Level of Care:  OP  Hospital Course:         [name] is an 52 y.o. female admitted voluntarily and emergently from [name] assessment for bipolar disorder at the noncompliant with medications and increased his abuse of cocaine and marijuana presented with increasing auditory and visual hallucinations. Patient said "I'm hearing voices and they telling me to hurt myself". She is seeing shapes and shadows and said "I need to come in the hospital, because I have been out of my medicine for about 2 weeks".       [name] was admitted to the adult unit where she was evaluated and her symptoms were identified. Medication management was discussed and implemented. She was encouraged to participate in unit programming. Medical  problems were identified and treated appropriately.        [name] was evaluated each day by a clinical provider to ascertain the patient's response to treatment.  Improvement was noted by the patient's report of decreasing symptoms, improved sleep and appetite, affect, medication tolerance, behavior, and participation in unit programming.                 [name] was asked each day to complete a self inventory noting mood, mental status, pain, new symptoms, anxiety and concerns.       She responded well to medication and being in a therapeutic and supportive environment. Positive and appropriate behavior was noted and the patient was motivated for recovery.  [name] worked closely with the treatment team and case manager to develop a discharge plan with appropriate goals. Coping skills, problem solving as well as relaxation therapies were also part of the unit programming.         By the day of discharge [name] was in much improved condition than upon admission.  Symptoms were reported as significantly decreased or resolved completely.         The patient denied SI/HI and voiced no AVH. She was motivated to continue taking medication with a goal of continued improvement in mental health. [name] was discharged home with a plan to follow up as noted below.   Consults:  None  Significant Diagnostic Studies:  labs: CMP, CBC, UA, UDS  Discharge Vitals:   Blood pressure 123/70, pulse 82, temperature 97.8 F (36.6 C), temperature source Oral, resp. rate 16, height 5' (1.524  m), weight 57.607 kg (127 lb). Body mass index is 24.8 kg/(m^2). Lab Results:   Results for orders placed during the hospital encounter of 09/30/13 (from the past 72 hour(s))  PROLACTIN     Status: None   Collection Time    10/03/13  6:15 AM      Result Value Range   Prolactin 14.1     Comment: (NOTE)         Reference Ranges:                     Female:                       2.1 -  17.1 ng/ml                     Female:    Pregnant          9.7 - 208.5 ng/mL                               Non Pregnant      2.8 -  29.2 ng/mL                               Post Menopausal   1.8 -  20.3 ng/mL                           Performed at [facility]    Physical Findings: AIMS: Facial and Oral Movements Muscles of Facial Expression: None, normal Lips and Perioral Area: None, normal Jaw: None, normal Tongue: None, normal,Extremity Movements Upper (arms, wrists, hands, fingers): None, normal Lower (legs, knees, ankles, toes): None, normal, Trunk Movements Neck, shoulders, hips: None, normal, Overall Severity Severity of abnormal movements (highest score from questions above): None, normal Incapacitation due to abnormal movements: None, normal Patient's awareness of abnormal movements (rate only patient's report): No Awareness, Dental Status Current problems with teeth and/or dentures?: No Does patient usually wear dentures?: No  CIWA:  CIWA-Ar Total: 0 COWS:  COWS Total Score: 0  Psychiatric Specialty Exam: See Psychiatric Specialty Exam and Suicide Risk Assessment completed by Attending Physician prior to discharge.  Discharge destination:  Home  Is patient on multiple antipsychotic therapies at discharge:  No   Has Patient had three or more failed trials of antipsychotic monotherapy by history:  No  Recommended Plan for Multiple Antipsychotic Therapies: NA  Discharge Orders   Future Orders Complete By Expires   Diet - low sodium heart healthy  As directed    Discharge instructions  As directed    Comments:     Take all of your medications as directed. Be sure to keep all of your follow up appointments.  If you are unable to keep your follow up appointment, call your Doctor's office to let them know, and reschedule.  Make sure that you have enough medication to last until your appointment. Be sure to get plenty of rest. Going to bed at the same time each night will help. Try to avoid sleeping during  the day.  Increase your activity as tolerated. Regular exercise will help you to sleep better and improve your mental health. Eating a heart healthy diet is recommended. Try to avoid salty or fried foods. Be sure to avoid all alcohol and illegal drugs.  Increase activity slowly  As directed        Medication List       Indication   multivitamin with minerals Tabs tablet  Take 1 tablet by mouth daily. For nutritional supplement.      QUEtiapine 400 MG tablet  Commonly known as:  SEROQUEL  Take 1 tablet (400 mg total) by mouth at bedtime. For depression, psychosis, mood stabilization and insomnia.   Indication:  Depressive Phase of Manic-Depression, Trouble Sleeping, Manic Phase of Manic-Depression     QUEtiapine 50 MG tablet  Commonly known as:  SEROQUEL  Take 1 tablet (50 mg total) by mouth 2 (two) times daily. For anxiety and agitation.   Indication:  Manic Phase of Manic-Depression         Follow-up recommendations:   Activities: Resume activity as tolerated. Diet: Heart healthy low sodium diet Tests: Follow up testing will be determined by your out patient provider.  Comments:    Total Discharge Time:  Greater than 30 minutes.  Signed: MASHBURN,NEIL 10/03/2013, 12:15 PM  Patient was seen face-to-face evaluation, case discussed with the treatment team number and made disposition plans and suicide risk assessment.Reviewed the information documented and agree with the treatment plan.  Devonda Pequignot,JANARDHAHA R. 10/06/2013 6:16 PM

## 2013-10-04 NOTE — Progress Notes (Signed)
D: Pt has been visible on the unit. Pt states that she is feeling better.Passive suicidal thoughts on & off.Pt plans on changing her social network after discharge. Pt states that she is having auditory hallucinations.Pt has limited insight.Pt takes medications as prescribed. A: Supported & encouraged. Continues on 15 minute checks. R: Pt. safety maintained.

## 2013-10-04 NOTE — [facility] Group Notes (Signed)
[facility] Group Notes:  (Clinical Social Work)  10/04/2013   3:00-4:00PM  Summary of Progress/Problems:   The main focus of today's process group was for the patient to identify ways in which they have sabotaged their own mental health wellness/recovery.  Motivational interviewing was used to explore the reasons they engage in this behavior, and reasons they may have for wanting to change.  The Stages of Change were explained to the group using a handout, and patients identified where they are with regard to changing self-defeating behaviors.  The patient was late to group, only present for last 10 minutes, but expressed she is in Preparation stage of change, wants to go to outpatient treatment and will work on her mental health issues "for the rest of my life."  Type of Therapy:  Process Group  Participation Level:  Active  Participation Quality:  Attentive  Affect:  Blunted and Depressed  Cognitive:  Oriented  Insight:  Developing/Improving  Engagement in Therapy:  Engaged  Modes of Intervention:  Education, Motivational Interviewing   Ambrose Mantle, LCSW 10/04/2013, 4:25 PM

## 2013-10-04 NOTE — Progress Notes (Addendum)
Patient ID: [name], female   DOB: [birth year], 52 y.o.   MRN: [id number] D: Pt is awake and active on the unit this AM. Pt endorses passive SI but he is able to contract for safety. Pt rates their depression at 7 and hopelessness at 7. Pt's mood is preoccupied and her affect is worried. Pt is focused on her fear of being discharged and states that she has suicidal thoughts as a result. Pt attends groups and is cooperative with staff. Her insight is limited but improving somewhat. Pt acknowledges that she needs to change her social network and stay involved in her recovery.   A: Encouraged pt to discuss feelings with staff and administered medication per MD orders. Writer also encouraged pt to participate in groups.  R: Pt is attending groups and tolerating medications well. Writer will continue to monitor. 15 minute checks are ongoing for safety.

## 2013-10-04 NOTE — Progress Notes (Signed)
[facility] Group Notes:  (Nursing/MHT/Case Management/Adjunct)  Date:  10/03/2013 Time:  2000  Type of Therapy:  Psychoeducational Skills  Participation Level:  Active  Participation Quality:  Monopolizing  Affect:  Labile  Cognitive:  Disorganized  Insight:  Lacking  Engagement in Group:  Monopolizing  Modes of Intervention:  Education  Summary of Progress/Problems: The patient verbalized in group this evening that she had a bad day since the doctor attempted to have her discharged. She strongly disagreed with that decision and recalled how she informed multiple staff members that she was not ready to be discharged. She stated that had she been discharged then she would have either used drugs or tried to kill herself. Her goal for tomorrow is to not have to convince the staff that she is not ready for discharge and that the staff should be more responsive to her needs.   [name] S 10/04/2013, 12:54 AM

## 2013-10-04 NOTE — Progress Notes (Signed)
Psychoeducational Group Note  Date:  08/03/2012 Time: 1015  Group Topic/Focus:  Identifying Needs:   The focus of this group is to help patients identify their personal needs that have been historically problematic and identify healthy behaviors to address their needs.  Participation Level:  Active  Participation Quality: good  Affect: flat  Cognitive: limited   Insight:  good  Engagement in Group: engaged  Additional Comments:  PD RN [facility]

## 2013-10-04 NOTE — Progress Notes (Signed)
Patient ID: [name], female   DOB: Aug 14, 1961, 52 y.o.   MRN: [id number] [facility] MD Progress Note  10/04/2013 6:58 PM Audree Schrecengost  MRN:  [id number] Subjective:  Patient states she is still depressed and has suicidal thoughts.    Diagnosis:   DSM5: Schizophrenia Disorders:   Obsessive-Compulsive Disorders:   Trauma-Stressor Disorders:   Substance/Addictive Disorders:   Depressive Disorders:    Axis I: Bipolar, mixed, Substance Induced Mood Disorder and polysubstance abuse  ADL's:  Impaired  Sleep: Poor  Appetite:  Fair  Suicidal Ideation:  denied Homicidal Ideation:  denied AEB (as evidenced by):  Psychiatric Specialty Exam: Review of Systems  Constitutional: Negative.  Negative for fever, chills, weight loss, malaise/fatigue and diaphoresis.  HENT: Negative for congestion and sore throat.   Eyes: Negative for blurred vision, double vision and photophobia.  Respiratory: Negative for cough, shortness of breath and wheezing.   Cardiovascular: Negative for chest pain, palpitations and PND.  Gastrointestinal: Negative for heartburn, nausea, vomiting, abdominal pain, diarrhea and constipation.  Musculoskeletal: Negative for falls, joint pain and myalgias.  Neurological: Negative for dizziness, tingling, tremors, sensory change, speech change, focal weakness, seizures, loss of consciousness, weakness and headaches.  Endo/Heme/Allergies: Negative for polydipsia. Does not bruise/bleed easily.  Psychiatric/Behavioral: Negative for depression, suicidal ideas, hallucinations, memory loss and substance abuse. The patient is not nervous/anxious and does not have insomnia.     Blood pressure 128/66, pulse 86, temperature 97.9 F (36.6 C), temperature source Oral, resp. rate 18, height 5' (1.524 m), weight 57.607 kg (127 lb).Body mass index is 24.8 kg/(m^2).  General Appearance: Fairly Groomed and Guarded  Patent attorney::  Fair  Speech:  Clear and Coherent  Volume:  Normal   Mood:  Angry, Anxious and Depressed  Affect:  Depressed and Labile  Thought Process:  Goal Directed and Intact  Orientation:  Full (Time, Place, and Person)  Thought Content:  denies  Suicidal Thoughts:  Yes.  without intent/plan  Homicidal Thoughts:  No  Memory:  Immediate;   Fair  Judgement:  Impaired  Insight:  Lacking  Psychomotor Activity:  Restlessness  Concentration:  Fair  Recall:  Fair  Akathisia:  NA  Handed:  Right  AIMS (if indicated):     Assets:  Communication Skills Desire for Improvement Physical Health Resilience Social Support  Sleep:  Number of Hours: 6.5   Current Medications: Current Facility-Administered Medications  Medication Dose Route Frequency Provider Last Rate Last Dose  . acetaminophen (TYLENOL) tablet 650 mg  650 mg Oral Q6H PRN Audrea Muscat, NP   650 mg at 10/04/13 0924  . alum & mag hydroxide-simeth (MAALOX/MYLANTA) 200-200-20 MG/5ML suspension 30 mL  30 mL Oral Q4H PRN Evanna Janann August, NP      . feeding supplement (ENSURE COMPLETE) (ENSURE COMPLETE) liquid 237 mL  237 mL Oral BID BM Nehemiah Settle, MD   237 mL at 10/04/13 1542  . magnesium hydroxide (MILK OF MAGNESIA) suspension 30 mL  30 mL Oral Daily PRN Evanna Janann August, NP      . multivitamin with minerals tablet 1 tablet  1 tablet Oral Daily Evanna Janann August, NP   1 tablet at 10/04/13 0805  . QUEtiapine (SEROQUEL) tablet 400 mg  400 mg Oral QHS Audrea Muscat, NP   400 mg at 10/03/13 2116  . QUEtiapine (SEROQUEL) tablet 50 mg  50 mg Oral BID Nehemiah Settle, MD   50 mg at 10/04/13 1542    Lab Results:  Results for  orders placed during the hospital encounter of 09/30/13 (from the past 48 hour(s))  PROLACTIN     Status: None   Collection Time    10/03/13  6:15 AM      Result Value Range   Prolactin 14.1     Comment: (NOTE)         Reference Ranges:                     Female:                       2.1 -  17.1 ng/ml                     Female:    Pregnant          9.7 - 208.5 ng/mL                               Non Pregnant      2.8 -  29.2 ng/mL                               Post Menopausal   1.8 -  20.3 ng/mL                           Performed at [facility]    Physical Findings: AIMS: Facial and Oral Movements Muscles of Facial Expression: None, normal Lips and Perioral Area: None, normal Jaw: None, normal Tongue: None, normal,Extremity Movements Upper (arms, wrists, hands, fingers): None, normal Lower (legs, knees, ankles, toes): None, normal, Trunk Movements Neck, shoulders, hips: None, normal, Overall Severity Severity of abnormal movements (highest score from questions above): None, normal Incapacitation due to abnormal movements: None, normal Patient's awareness of abnormal movements (rate only patient's report): No Awareness, Dental Status Current problems with teeth and/or dentures?: No Does patient usually wear dentures?: No  CIWA:  CIWA-Ar Total: 0 COWS:  COWS Total Score: 0  Treatment Plan Summary: Daily contact with patient to assess and evaluate symptoms and progress in treatment Medication management  Plan: 1. Discharge is cancelled due to current SI thought.  2. Continue current meds  Treatment Plan/Recommendations:    Medical Decision Making Problem Points:  Established problem, worsening (2), New problem, with no additional work-up planned (3) and Review of last therapy session (1) Data Points:  Review or order clinical lab tests (1) Review or order medicine tests (1) Review of medication regiment & side effects (2) Review of new medications or change in dosage (2)  I certify that inpatient services furnished can reasonably be expected to improve the patient's condition.    Wonda Cerise 10/04/2013 6:58 PM

## 2013-10-05 MED ORDER — SULFAMETHOXAZOLE-TMP DS 800-160 MG PO TABS
1.0000 | ORAL_TABLET | Freq: Two times a day (BID) | ORAL | Status: DC
  Administered 2013-10-05 – 2013-10-06 (×3): 1 via ORAL
  Filled 2013-10-05 (×5): qty 1

## 2013-10-05 NOTE — Progress Notes (Signed)
[facility] Group Notes:  (Nursing/MHT/Case Management/Adjunct)  Date:  10/04/2013 Time:  2000  Type of Therapy:  Psychoeducational Skills  Participation Level:  Minimal  Participation Quality:  Attentive  Affect:  Blunted  Cognitive:  Appropriate  Insight:  Lacking  Engagement in Group:  Lacking  Modes of Intervention:  Education  Summary of Progress/Problems: The patient shared with the group that she had a better day without offering any details. As a theme for the day, she stated that her coping mechanisms include going to the outpatient department. Her goal for tomorrow is to attend more of the groups.   Chanie Soucek S 10/05/2013, 2:24 AM

## 2013-10-05 NOTE — [facility] Group Notes (Signed)
[facility] Group Notes: (Clinical Social Work)   10/05/2013      Type of Therapy:  Group Therapy   Participation Level:  Did Not Attend (WAS PRESENT AT THE BEGINNING OF GROUP BUT LEFT COMPLAINING OF THE HEAT.)   Ambrose Mantle, LCSW 10/05/2013, 4:20 PM

## 2013-10-05 NOTE — Progress Notes (Signed)
Writer observed patient sitting in the dayroom watching tv and interacting with peers appropriately. Writer spoke with patient 1:1 and she reports having a better day and feel that her medications are working. Patient reports that her friend is going to be her support and she will more that likely stay with him once discharged. She reports that she will be doing outpatient here at [facility] and is looking forward to this. Writer offered support and encouragement. Safety maintained on unit, will continue to monitor.

## 2013-10-05 NOTE — Progress Notes (Signed)
Patient ID: [name], female   DOB: [birth year], 52 y.o.   MRN: [id number] [facility] MD Progress Note  10/05/2013 6:10 PM Nura Cahoon  MRN:  [id number] Subjective:  Patient states that her mood is better. Reports UTI symptoms today.   Diagnosis:   DSM5: Schizophrenia Disorders:   Obsessive-Compulsive Disorders:   Trauma-Stressor Disorders:   Substance/Addictive Disorders:   Depressive Disorders:    Axis I: Bipolar, mixed, Substance Induced Mood Disorder and polysubstance abuse  ADL's:  Impaired  Sleep: Poor  Appetite:  Fair  Suicidal Ideation:  denied Homicidal Ideation:  denied AEB (as evidenced by):  Psychiatric Specialty Exam: Review of Systems  Constitutional: Negative.  Negative for fever, chills, weight loss, malaise/fatigue and diaphoresis.  HENT: Negative for congestion and sore throat.   Eyes: Negative for blurred vision, double vision and photophobia.  Respiratory: Negative for cough, shortness of breath and wheezing.   Cardiovascular: Negative for chest pain, palpitations and PND.  Gastrointestinal: Negative for heartburn, nausea, vomiting, abdominal pain, diarrhea and constipation.  Musculoskeletal: Negative for falls, joint pain and myalgias.  Neurological: Negative for dizziness, tingling, tremors, sensory change, speech change, focal weakness, seizures, loss of consciousness, weakness and headaches.  Endo/Heme/Allergies: Negative for polydipsia. Does not bruise/bleed easily.  Psychiatric/Behavioral: Negative for depression, suicidal ideas, hallucinations, memory loss and substance abuse. The patient is not nervous/anxious and does not have insomnia.     Blood pressure 125/75, pulse 91, temperature 98.1 F (36.7 C), temperature source Oral, resp. rate 18, height 5' (1.524 m), weight 57.607 kg (127 lb).Body mass index is 24.8 kg/(m^2).  General Appearance: Fairly Groomed and Guarded  Patent attorney::  Fair  Speech:  Clear and Coherent  Volume:  Normal   Mood:  Angry, Anxious and Depressed  Affect:  Depressed and Labile  Thought Process:  Goal Directed and Intact  Orientation:  Full (Time, Place, and Person)  Thought Content:  denies  Suicidal Thoughts:  denies  Homicidal Thoughts:  No  Memory:  Immediate;   Fair  Judgement:  Impaired  Insight:  Lacking  Psychomotor Activity:  Restlessness  Concentration:  Fair  Recall:  Fair  Akathisia:  NA  Handed:  Right  AIMS (if indicated):     Assets:  Communication Skills Desire for Improvement Physical Health Resilience Social Support  Sleep:  Number of Hours: 6.75   Current Medications: Current Facility-Administered Medications  Medication Dose Route Frequency Provider Last Rate Last Dose  . acetaminophen (TYLENOL) tablet 650 mg  650 mg Oral Q6H PRN Audrea Muscat, NP   650 mg at 10/04/13 0924  . alum & mag hydroxide-simeth (MAALOX/MYLANTA) 200-200-20 MG/5ML suspension 30 mL  30 mL Oral Q4H PRN Evanna Janann August, NP      . feeding supplement (ENSURE COMPLETE) (ENSURE COMPLETE) liquid 237 mL  237 mL Oral BID BM Nehemiah Settle, MD   237 mL at 10/05/13 1000  . magnesium hydroxide (MILK OF MAGNESIA) suspension 30 mL  30 mL Oral Daily PRN Evanna Janann August, NP      . multivitamin with minerals tablet 1 tablet  1 tablet Oral Daily Evanna Janann August, NP   1 tablet at 10/05/13 0738  . QUEtiapine (SEROQUEL) tablet 400 mg  400 mg Oral QHS Audrea Muscat, NP   400 mg at 10/04/13 2124  . QUEtiapine (SEROQUEL) tablet 50 mg  50 mg Oral BID Nehemiah Settle, MD   50 mg at 10/05/13 1808  . sulfamethoxazole-trimethoprim (BACTRIM DS) 800-160 MG per tablet 1 tablet  1 tablet Oral Q12H Wonda Cerise, MD   1 tablet at 10/05/13 1358    Lab Results:  No results found for this or any previous visit (from the past 48 hour(s)).  Physical Findings: AIMS: Facial and Oral Movements Muscles of Facial Expression: None, normal Lips and Perioral Area: None, normal Jaw: None,  normal Tongue: None, normal,Extremity Movements Upper (arms, wrists, hands, fingers): None, normal Lower (legs, knees, ankles, toes): None, normal, Trunk Movements Neck, shoulders, hips: None, normal, Overall Severity Severity of abnormal movements (highest score from questions above): None, normal Incapacitation due to abnormal movements: None, normal Patient's awareness of abnormal movements (rate only patient's report): No Awareness, Dental Status Current problems with teeth and/or dentures?: No Does patient usually wear dentures?: No  CIWA:  CIWA-Ar Total: 0 COWS:  COWS Total Score: 0  Treatment Plan Summary: Daily contact with patient to assess and evaluate symptoms and progress in treatment Medication management  Plan: 1. Discharge is cancelled due to current SI thought.  2. Start bactrim for UTI  Treatment Plan/Recommendations:    Medical Decision Making Problem Points:  Established problem, worsening (2), New problem, with no additional work-up planned (3) and Review of last therapy session (1) Data Points:  Review or order clinical lab tests (1) Review or order medicine tests (1) Review of medication regiment & side effects (2) Review of new medications or change in dosage (2)  I certify that inpatient services furnished can reasonably be expected to improve the patient's condition.    Wonda Cerise 10/05/2013 6:10 PM

## 2013-10-05 NOTE — Progress Notes (Signed)
Patient ID: [name], female   DOB: 07-25-1961, 52 y.o.   MRN: [id number] D: Pt is awake and active on the unit this AM. Pt endorses passive SI but she is able to contract for safety. Pt rates their depression at 7 and hopelessness at 7. Pt's mood is depressed and her affect is sad. Pt writes that she is prepared to "start outpatient" therapy after discharge. Pt also acknowledges the need to attend 12 step support groups. Pt is attending groups with encouragement and is cooperative with staff.  A: Encouraged pt to discuss feelings with staff and administered medication per MD orders. Writer also encouraged pt to participate in groups.  R: Pt is attending groups and tolerating medications well. Writer will continue to monitor. 15 minute checks are ongoing for safety.

## 2013-10-05 NOTE — Progress Notes (Signed)
Psychoeducational Group Note  Date: 10/05/2013 Time: 0930 Group Topic/Focus:  Gratefulness:  The focus of this group is to help patients identify what three things they are most grateful for in their lives. What helps ground them and to center them on their work to their recovery.  Participation Level:  Active  Participation Quality:  Appropriate  Affect:  Appropriate  Cognitive:  Alert  Insight:  Improving  Engagement in Group:  Engaged  Additional Comments:  Pt stated she was grateful for her belief in God, her child, and the [facility] Vira Blanco A

## 2013-10-05 NOTE — Progress Notes (Signed)
Date: 10/05/2013  Time: 1015  Group Topic/Focus:  Making Healthy Choices: The focus of this group is to help patients identify negative/unhealthy choices they were using prior to admission and identify positive/healthier coping strategies to replace them upon discharge.  Participation Level: Active  Participation Quality: Appropriate  Affect: Appropriate  Cognitive: Oriented  Insight: Improving  Engagement in Group: Improving  Additional Comments: was engaged in the discussion and partisipated  Brisa Auth A  10/05/2013  

## 2013-10-05 NOTE — Progress Notes (Signed)
Report received from C. Microbiologist. Writer entered patients room and observed her lying in bed asleep, no distress noted. Will continue to monitor.

## 2013-10-06 NOTE — Tx Team (Signed)
Interdisciplinary Treatment Plan Update (Adult)  Date: 10/06/2013  Time Reviewed:  9:45 AM  Progress in Treatment: Attending groups: Yes Participating in groups:  Yes Taking medication as prescribed:  Yes Tolerating medication:  Yes Family/Significant othe contact made: No, pt refused Patient understands diagnosis:  Yes Discussing patient identified problems/goals with staff:  Yes Medical problems stabilized or resolved:  Yes Denies suicidal/homicidal ideation: Yes Issues/concerns per patient self-inventory:  Yes Other:  New problem(s) identified: N/A  Discharge Plan or Barriers: Pt will follow up with Dr. Lolly Mustache for medication management.   Reason for Continuation of Hospitalization: Stable to d/c today  Comments: N/A  Estimated length of stay: D/C today  For review of initial/current patient goals, please see plan of care.  Attendees: Patient:  [name]  10/06/2013 12:03 PM   Family:     Physician:  Dr. Javier Glazier 10/06/2013 12:03 PM   Nursing:   Neill Loft, RN 10/06/2013 12:03 PM   Clinical Social Worker:  Reyes Ivan, LCSWA 10/06/2013 12:03 PM   Other: Verne Spurr, PA 10/06/2013 12:03 PM   Other:  Frankey Shown, MA care coordination 10/06/2013 12:03 PM   Other:  Quintella Reichert, RN 10/06/2013 12:03 PM   Other:     Other:    Other:    Other:    Other:    Other:      Scribe for Treatment Team:   Carmina Miller, 10/06/2013 , 12:03 PM

## 2013-10-06 NOTE — Progress Notes (Addendum)
Pt attended spiritual care group on grief and loss facilitated by chaplain Burnis Kingfisher.  Group opened with brief discussion and psycho-social ed around grief and loss in relationships and in relation to self - identifying life patterns, circumstances, changes that cause losses. Established group norm of speaking from own life experience.  Group goal of establishing open and affirming space for members to share loss and experience with grief, normalize grief experience and provide psycho social education and grief support. Group members discussed guilt related to decisions made in life - expressing grief around the ways they have hurt others, changes in relationships, and difficulty in forgiving self and accepting forgiveness from others.  Also discussed loss of family members without saying goodbye, focusing on theme of regret around things left unsaid.  Group discussed ways different family members    Jalaysia discussed the loss of her father and related feelings of regret and uncertainty around her ability to participate in his care.  Was able to receive support from other group members and reflect on how she carries these feelings.    Belva Crome MDiv

## 2013-10-06 NOTE — [facility] Group Notes (Signed)
[facility] LCSW Aftercare Discharge Planning Group Note   10/06/2013 8:45 AM  Participation Quality:  Alert and Appropriate   Mood/Affect:  Appropriate and Calm  Depression Rating:  6  Anxiety Rating:  10  Thoughts of Suicide:  Pt denies SI/HI  Will you contract for safety?   Yes  Current AVH:  Pt denies  Plan for Discharge/Comments:  Pt attended discharge planning group and actively participated in group.  CSW provided pt with today's workbook.  Pt reports feeling stable to d/c today.  Pt will return home in [place].  Pt will follow up at [facility] for medication management. No further needs voiced by pt at this time.    Transportation Means: Pt reports access to transportation - provided pt with a bus pass  Supports: No supports mentioned at this time  Reyes Ivan, LCSWA 10/06/2013 10:43 AM

## 2013-10-06 NOTE — [facility] Suicide Risk Assessment (Signed)
Suicide Risk Assessment  Discharge Assessment     Demographic Factors:  Adolescent or young adult, Low socioeconomic status and Unemployed  Mental Status Per Nursing Assessment::   On Admission:  NA  Current Mental Status by Physician: Mental Status Examination: Patient appeared as per his stated age, casually dressed, and fairly groomed, and maintaining good eye contact. Patient has good mood and his affect was constricted. He has normal rate, rhythm, and volume of speech. His thought process is linear and goal directed. Patient has denied suicidal, homicidal ideations, intentions or plans. Patient has no evidence of auditory or visual hallucinations, delusions, and paranoia. Patient has fair insight judgment and impulse control.  Loss Factors: Financial problems/change in socioeconomic status  Historical Factors: Prior suicide attempts and Impulsivity  Risk Reduction Factors:   Sense of responsibility to family, Religious beliefs about death, Living with another person, especially a relative, Positive social support, Positive therapeutic relationship and Positive coping skills or problem solving skills  Continued Clinical Symptoms:  Bipolar Disorder:   Mixed State Alcohol/Substance Abuse/Dependencies Unstable or Poor Therapeutic Relationship Previous Psychiatric Diagnoses and Treatments Medical Diagnoses and Treatments/Surgeries  Cognitive Features That Contribute To Risk:  Polarized thinking    Suicide Risk:  Minimal: No identifiable suicidal ideation.  Patients presenting with no risk factors but with morbid ruminations; may be classified as minimal risk based on the severity of the depressive symptoms  Discharge Diagnoses:   AXIS I:  Bipolar, mixed and Substance Abuse AXIS II:  Deferred AXIS III:   Past Medical History  Diagnosis Date  . Depression   . Bipolar 1 disorder   . Drug abuse    AXIS IV:  economic problems, other psychosocial or environmental problems,  problems related to social environment and problems with primary support group AXIS V:  61-70 mild symptoms  Plan Of Care/Follow-up recommendations:  Activity:  As tolerated Diet:  Regular  Is patient on multiple antipsychotic therapies at discharge:  No   Has Patient had three or more failed trials of antipsychotic monotherapy by history:  No  Recommended Plan for Multiple Antipsychotic Therapies: NA  Nehemiah Settle., M.D. 10/06/2013, 1:29 PM

## 2013-10-06 NOTE — Progress Notes (Signed)
Discharge Note:  Patient discharged with bus pass.  Patient received all her belongings, miscellaneous items, toiletries, phone, charger, cards, jewelry, wallet, red/black bags, miscellaneous items, toiletries, prescriptions, medications.  Patient denied SI and Hi.  Denied A/V hallucinations.   Denied pain.  Patient stated she appreciated all assistance received from staff.  Suicide prevention information given and discussed with patient who stated she understood and had no questions.

## 2013-10-06 NOTE — Progress Notes (Signed)
[facility] Group Notes:  (Nursing/MHT/Case Management/Adjunct)  Date:  10/05/2013 Time:  2000  Type of Therapy:  Psychoeducational Skills  Participation Level:  Active  Participation Quality:  Appropriate  Affect:  Appropriate  Cognitive:  Appropriate  Insight:  Good  Engagement in Group:  Engaged  Modes of Intervention:  Education  Summary of Progress/Problems: The patient shared with the group that she continues to feel better. She mentioned that she feels less sleepy and denies hearing voices. In addition, she mentioned that she has a place to stay following discharge. Her goal for tomorrow is to attend more of the groups. Her support system (theme of the day) is a close friend of hers.   [name] S 10/06/2013, 1:35 AM

## 2013-10-06 NOTE — Progress Notes (Addendum)
[facility] Adult Case Management Discharge Plan :  Will you be returning to the same living situation after discharge: Yes,  staying with friend  At discharge, do you have transportation home?:Yes,  bus pass Do you have the ability to pay for your medications:Yes,  [employer]  Release of information consent forms completed and in the chart;  Patient's signature needed at discharge.  Patient to Follow up at: Follow-up Information   Follow up with Dr. Lolly Mustache - [facility] On 10/14/2013. (Tuesday, October 14, 2013 at 4:00)    Contact information:   [address] [place], [place]   [address]  [phone]      Patient denies SI/HI:   Yes,  during group/self report.     Safety Planning and Suicide Prevention discussed:  Yes,  pt refused to consent to SPE with supports. SPE completed with pt and pt given SPI pamphlet and encouraged to share with her support network.   Smart, Loomis Anacker 10/06/2013, 10:45 AM

## 2013-10-06 NOTE — Progress Notes (Addendum)
D:  Patient's self inventory sheet, patient sleeps fair, good appetite, normal energy level, improving attention span.  Rate depression and hopeless #7, anxiety #10.  Has felt agitated in pat 24 hours.  SI off/on, contracts or safety.  In past 24 hours, felt dizzy, headache, blurred vision in past 24 hours.  Pain goal #6, worst pain #6.  Plans to take meds, choose people, places, things.  Will discuss with MD.  Upset over possible discharge last Friday. A:  Medications administered per MD orders.  Emotional support and encouragement given patient. R:  Denied HI.   Denied A/V hallucinations.  Denied pain.  WiIll continue to monitor for safety with 15 minute checks.  Safety maintained.  Patient talked to MD and feels she is ready to be discharged today.  Patient discussed the problems she had with discharge.    Stated she did not feel she has been ready to be discharged until today.

## 2013-10-09 NOTE — Progress Notes (Signed)
Patient Discharge Instructions:  Next Level Care Provider Has Access to the EMR, 10/09/13 Records provided to [facility] via [facility]/Epic access.  Jerelene Redden, 10/09/2013, 4:17 PM

## 2013-10-14 ENCOUNTER — Ambulatory Visit (HOSPITAL_COMMUNITY): Payer: Self-pay | Admitting: Psychiatry

## 2013-12-30 ENCOUNTER — Emergency Department (HOSPITAL_COMMUNITY)
Admission: EM | Admit: 2013-12-30 | Discharge: 2013-12-30 | Disposition: A | Discharge status: Home / Self Care | Payer: Medicare Other | Attending: Emergency Medicine | Admitting: Emergency Medicine

## 2013-12-30 ENCOUNTER — Encounter (HOSPITAL_COMMUNITY): Payer: Self-pay | Admitting: Emergency Medicine

## 2013-12-30 DIAGNOSIS — F17200 Nicotine dependence, unspecified, uncomplicated: Secondary | ICD-10-CM | POA: Insufficient documentation

## 2013-12-30 DIAGNOSIS — K0889 Other specified disorders of teeth and supporting structures: Secondary | ICD-10-CM

## 2013-12-30 DIAGNOSIS — K029 Dental caries, unspecified: Secondary | ICD-10-CM | POA: Insufficient documentation

## 2013-12-30 DIAGNOSIS — Z79899 Other long term (current) drug therapy: Secondary | ICD-10-CM | POA: Insufficient documentation

## 2013-12-30 DIAGNOSIS — K089 Disorder of teeth and supporting structures, unspecified: Secondary | ICD-10-CM | POA: Insufficient documentation

## 2013-12-30 DIAGNOSIS — F319 Bipolar disorder, unspecified: Secondary | ICD-10-CM | POA: Insufficient documentation

## 2013-12-30 MED ORDER — HYDROCODONE-ACETAMINOPHEN 5-325 MG PO TABS: 1.0000 | ORAL_TABLET | ORAL | Status: DC | PRN

## 2013-12-30 MED ORDER — HYDROCODONE-ACETAMINOPHEN 5-325 MG PO TABS
1.0000 | ORAL_TABLET | Freq: Once | ORAL | Status: AC
  Administered 2013-12-30: 1 via ORAL
  Filled 2013-12-30: qty 1

## 2013-12-30 NOTE — Discharge Instructions (Signed)

## 2013-12-30 NOTE — ED Provider Notes (Signed)
CSN: [id number]     Arrival date & time 12/30/13  1218 History   First MD Initiated Contact with Patient 12/30/13 1250     Chief Complaint  Patient presents with  . Dental Pain   (Consider location/radiation/quality/duration/timing/severity/associated sxs/prior Treatment) Patient is a 53 y.o. female presenting with tooth pain.  Dental Pain Location:  Upper Upper teeth location: left central. Quality:  Dull Severity:  Severe Onset quality:  Gradual Duration:  2 days Timing:  Constant Progression:  Worsening Chronicity:  Recurrent Context: dental caries   Relieved by:  Nothing Worsened by:  Cold food/drink (chewing) Ineffective treatments:  Acetaminophen Associated symptoms: no fever, no gum swelling, no oral bleeding and no trismus   Associated symptoms comment:  Pt reports an "abscess" several days ago near a separate tooth, which has resolved since.     Past Medical History  Diagnosis Date  . Depression   . Bipolar 1 disorder   . Drug abuse    Past Surgical History  Procedure Laterality Date  . No past surgeries     Family History  Problem Relation Age of Onset  . Anesthesia problems Neg Hx   . Hypotension Neg Hx   . Malignant hyperthermia Neg Hx   . Pseudochol deficiency Neg Hx    History  Substance Use Topics  . Smoking status: Current Every Day Smoker -- 0.50 packs/day for 10 years    Types: Cigarettes  . Smokeless tobacco: Never Used  . Alcohol Use: 15.0 oz/week    25 Cans of beer per week     Comment: Weekly    OB History   Grav Para Term Preterm Abortions TAB SAB Ect Mult Living                 Review of Systems  Constitutional: Negative for fever.  All other systems reviewed and are negative.    Allergies  Review of patient's allergies indicates no known allergies.  Home Medications   Current Outpatient Rx  Name  Route  Sig  Dispense  Refill  . Multiple Vitamin (MULTIVITAMIN WITH MINERALS) TABS tablet   Oral   Take 1 tablet by mouth  daily. For nutritional supplement.         . QUEtiapine (SEROQUEL) 400 MG tablet   Oral   Take 1 tablet (400 mg total) by mouth at bedtime. For depression, psychosis, mood stabilization and insomnia.   30 tablet   0   . QUEtiapine (SEROQUEL) 50 MG tablet   Oral   Take 1 tablet (50 mg total) by mouth 2 (two) times daily. For anxiety and agitation.   60 tablet   0    BP 130/75  Pulse 79  Temp(Src) 98.1 F (36.7 C) (Oral)  Resp 20  Ht 5' (1.524 m)  Wt 137 lb (62.143 kg)  BMI 26.76 kg/m2  SpO2 97% Physical Exam  Nursing note and vitals reviewed. Constitutional: She is oriented to person, place, and time. She appears well-developed and well-nourished. No distress.  HENT:  Head: Normocephalic and atraumatic.  Mouth/Throat: No trismus in the jaw.  Extensive poor dentition and dental caries.  No fluctuance or visible signs of dental abscess.  TTP of multiple broken central upper left teeth.    Eyes: Conjunctivae are normal. No scleral icterus.  Neck: Neck supple.  Cardiovascular: Normal rate and intact distal pulses.   Pulmonary/Chest: Effort normal. No stridor. No respiratory distress.  Abdominal: Normal appearance. She exhibits no distension.  Neurological: She is alert and  oriented to person, place, and time.  Skin: Skin is warm and dry. No rash noted.  Psychiatric: She has a normal mood and affect. Her behavior is normal.    ED Course  Procedures (including critical care time) Labs Review Labs Reviewed - No data to display Imaging Review No results found.  EKG Interpretation   None       MDM   1. Pain, dental    Pt with extensive caries presenting with dental pain.  No signs of abscess or deep space infection.  Will treat with norco and dc with close dental follow up.    Candyce ChurnJohn David Bascom Biel, MD 12/30/13 [phone]

## 2013-12-30 NOTE — ED Notes (Signed)
Pt c/o left upper dental pain x 2 days

## 2013-12-30 NOTE — ED Notes (Signed)
MD at bedside. 

## 2014-07-06 ENCOUNTER — Emergency Department (HOSPITAL_COMMUNITY)
Admission: EM | Admit: 2014-07-06 | Discharge: 2014-07-07 | Disposition: A | Discharge status: Home / Self Care | Payer: Medicare Other | Attending: Emergency Medicine | Admitting: Emergency Medicine

## 2014-07-06 ENCOUNTER — Encounter (HOSPITAL_COMMUNITY): Payer: Self-pay | Admitting: Emergency Medicine

## 2014-07-06 DIAGNOSIS — F1210 Cannabis abuse, uncomplicated: Secondary | ICD-10-CM | POA: Insufficient documentation

## 2014-07-06 DIAGNOSIS — F1410 Cocaine abuse, uncomplicated: Secondary | ICD-10-CM | POA: Insufficient documentation

## 2014-07-06 DIAGNOSIS — F1490 Cocaine use, unspecified, uncomplicated: Secondary | ICD-10-CM

## 2014-07-06 DIAGNOSIS — Z79899 Other long term (current) drug therapy: Secondary | ICD-10-CM | POA: Insufficient documentation

## 2014-07-06 DIAGNOSIS — R51 Headache: Secondary | ICD-10-CM | POA: Insufficient documentation

## 2014-07-06 DIAGNOSIS — F319 Bipolar disorder, unspecified: Secondary | ICD-10-CM | POA: Insufficient documentation

## 2014-07-06 DIAGNOSIS — F1290 Cannabis use, unspecified, uncomplicated: Secondary | ICD-10-CM

## 2014-07-06 DIAGNOSIS — Z3202 Encounter for pregnancy test, result negative: Secondary | ICD-10-CM | POA: Insufficient documentation

## 2014-07-06 DIAGNOSIS — F17200 Nicotine dependence, unspecified, uncomplicated: Secondary | ICD-10-CM | POA: Insufficient documentation

## 2014-07-06 DIAGNOSIS — R45850 Homicidal ideations: Secondary | ICD-10-CM

## 2014-07-06 DIAGNOSIS — R45851 Suicidal ideations: Secondary | ICD-10-CM | POA: Insufficient documentation

## 2014-07-06 LAB — COMPREHENSIVE METABOLIC PANEL
ALT: 23 U/L (ref 0–35)
ANION GAP: 11 (ref 5–15)
AST: 18 U/L (ref 0–37)
Albumin: 3.5 g/dL (ref 3.5–5.2)
Alkaline Phosphatase: 79 U/L (ref 39–117)
BUN: 10 mg/dL (ref 6–23)
CO2: 26 meq/L (ref 19–32)
Calcium: 9.3 mg/dL (ref 8.4–10.5)
Chloride: 104 mEq/L (ref 96–112)
Creatinine, Ser: 0.84 mg/dL (ref 0.50–1.10)
GFR, EST NON AFRICAN AMERICAN: 79 mL/min — AB (ref >90)
GLUCOSE: 137 mg/dL — AB (ref 70–99)
Potassium: 3.3 mEq/L — ABNORMAL LOW (ref 3.7–5.3)
SODIUM: 141 meq/L (ref 137–147)
TOTAL PROTEIN: 7 g/dL (ref 6.0–8.3)
Total Bilirubin: 0.3 mg/dL (ref 0.3–1.2)

## 2014-07-06 LAB — CBC
HEMATOCRIT: 41.5 % (ref 36.0–46.0)
HEMOGLOBIN: 13.7 g/dL (ref 12.0–15.0)
MCH: 29 pg (ref 26.0–34.0)
MCHC: 33 g/dL (ref 30.0–36.0)
MCV: 87.9 fL (ref 78.0–100.0)
Platelets: 270 10*3/uL (ref 150–400)
RBC: 4.72 MIL/uL (ref 3.87–5.11)
RDW: 14.8 % (ref 11.5–15.5)
WBC: 5.2 10*3/uL (ref 4.0–10.5)

## 2014-07-06 LAB — ACETAMINOPHEN LEVEL: Acetaminophen (Tylenol), Serum: <15 ug/mL (ref 10–30)

## 2014-07-06 LAB — SALICYLATE LEVEL: Salicylate Lvl: <2 mg/dL — ABNORMAL LOW (ref 2.8–20.0)

## 2014-07-06 LAB — ETHANOL

## 2014-07-06 MED ORDER — ACETAMINOPHEN 325 MG PO TABS
650.0000 mg | ORAL_TABLET | Freq: Once | ORAL | Status: AC
  Administered 2014-07-06: 650 mg via ORAL
  Filled 2014-07-06: qty 2

## 2014-07-06 NOTE — ED Notes (Signed)
Pt states that she's depressed because of family issues, she also complains of a headache. Pt states that she had thought about suicide but not currently

## 2014-07-06 NOTE — ED Provider Notes (Signed)
CSN: [id number]     Arrival date & time 07/06/14  1846 History  This chart was scribed for Mellody DrownLauren Charistopher Rumble, PA-C working with Gilda Creasehristopher J. Pollina, * by Evon Slackerrance Branch, ED Scribe. This patient was seen in room WTR3/[facility]PT3 and the patient's care was started at 8:07 PM.     Chief Complaint  Patient presents with  . Depression   The history is provided by the patient. No language interpreter was used.   HPI Comments: [name] is a 53 y.o. female who presents to the Emergency Department complaining of depression onset 1 week prior. She states that she is having issues with her adult daughter. She states she is having an associated headache. She denies having a specific plan. She states she has recently ran out of her Seroquel the past 2 days. She states she used cocaine and marijuana about 4 days prior. She denies any other drug use. She denies SI, HI, fever, hallucinations, nausea, vomiting, numbness, abdominal pain. She is unsure of the month or the day of the week it is. She states she was hospitalized for similar symptoms within the year. She also complains of headache, gradual onset since yesterday.   Past Medical History  Diagnosis Date  . Depression   . Bipolar 1 disorder   . Drug abuse    Past Surgical History  Procedure Laterality Date  . No past surgeries     Family History  Problem Relation Age of Onset  . Anesthesia problems Neg Hx   . Hypotension Neg Hx   . Malignant hyperthermia Neg Hx   . Pseudochol deficiency Neg Hx    History  Substance Use Topics  . Smoking status: Current Every Day Smoker -- 0.50 packs/day for 10 years    Types: Cigarettes  . Smokeless tobacco: Never Used  . Alcohol Use: 15.0 oz/week    25 Cans of beer per week     Comment: Weekly    OB History   Grav Para Term Preterm Abortions TAB SAB Ect Mult Living                 Review of Systems  Constitutional: Negative for fever and chills.  Eyes: Negative for photophobia and visual  disturbance.  Respiratory: Negative for shortness of breath.   Cardiovascular: Negative for chest pain.  Gastrointestinal: Negative for nausea, vomiting and abdominal pain.  Skin: Negative for wound.  Neurological: Positive for headaches. Negative for syncope, weakness, light-headedness and numbness.  Psychiatric/Behavioral: Negative for suicidal ideas and hallucinations.      Allergies  Review of patient's allergies indicates no known allergies.  Home Medications   Prior to Admission medications   Medication Sig Start Date End Date Taking? Authorizing Provider  HYDROcodone-acetaminophen (NORCO/VICODIN) 5-325 MG per tablet Take 1 tablet by mouth every 4 (four) hours as needed. 12/30/13   Candyce ChurnJohn David Wofford III, MD  Multiple Vitamin (MULTIVITAMIN WITH MINERALS) TABS tablet Take 1 tablet by mouth daily. For nutritional supplement. 10/03/13   Verne SpurrNeil Mashburn, PA-C  QUEtiapine (SEROQUEL) 400 MG tablet Take 1 tablet (400 mg total) by mouth at bedtime. For depression, psychosis, mood stabilization and insomnia. 10/03/13   Verne SpurrNeil Mashburn, PA-C  QUEtiapine (SEROQUEL) 50 MG tablet Take 1 tablet (50 mg total) by mouth 2 (two) times daily. For anxiety and agitation. 10/03/13   Verne SpurrNeil Mashburn, PA-C   BP 134/85  Pulse 74  Temp(Src) 98 F (36.7 C) (Oral)  Resp 18  SpO2 98%  Physical Exam  Nursing note and vitals  reviewed. Constitutional: She is oriented to person, place, and time. She appears well-developed and well-nourished. She is cooperative.  Non-toxic appearance. She does not have a sickly appearance. She does not appear ill. No distress.  HENT:  Head: Normocephalic and atraumatic.  Eyes: Conjunctivae and EOM are normal.  Neck: Normal range of motion. Neck supple.  Cardiovascular: Normal rate, regular rhythm and normal heart sounds.   Pulmonary/Chest: Effort normal. No respiratory distress.  Abdominal: Soft. There is no tenderness. There is no rebound.  Musculoskeletal: Normal range of  motion.  Neurological: She is alert and oriented to person, place, and time.  Speech is clear and goal oriented, follows commands Cranial nerves III - XII grossly intact, no facial droop Normal strength in upper and lower extremities bilaterally, strong and equal grip strength Sensation normal to light and sharp touch Moves all 4 extremities without ataxia, coordination intact   Skin: Skin is warm and dry. She is not diaphoretic.  Psychiatric: She has a normal mood and affect. Her behavior is normal.    ED Course  Procedures (including critical care time) Labs Review Results for orders placed during the hospital encounter of 07/06/14  CBC      Result Value Ref Range   WBC 5.2  4.0 - 10.5 K/uL   RBC 4.72  3.87 - 5.11 MIL/uL   Hemoglobin 13.7  12.0 - 15.0 g/dL   HCT 69.6  29.5 - 28.4 %   MCV 87.9  78.0 - 100.0 fL   MCH 29.0  26.0 - 34.0 pg   MCHC 33.0  30.0 - 36.0 g/dL   RDW 13.2  44.0 - 10.2 %   Platelets 270  150 - 400 K/uL  COMPREHENSIVE METABOLIC PANEL      Result Value Ref Range   Sodium 141  137 - 147 mEq/L   Potassium 3.3 (*) 3.7 - 5.3 mEq/L   Chloride 104  96 - 112 mEq/L   CO2 26  19 - 32 mEq/L   Glucose, Bld 137 (*) 70 - 99 mg/dL   BUN 10  6 - 23 mg/dL   Creatinine, Ser 7.25  0.50 - 1.10 mg/dL   Calcium 9.3  8.4 - 36.6 mg/dL   Total Protein 7.0  6.0 - 8.3 g/dL   Albumin 3.5  3.5 - 5.2 g/dL   AST 18  0 - 37 U/L   ALT 23  0 - 35 U/L   Alkaline Phosphatase 79  39 - 117 U/L   Total Bilirubin 0.3  0.3 - 1.2 mg/dL   GFR calc non Af Amer 79 (*) >90 mL/min   GFR calc Af Amer >90  >90 mL/min   Anion gap 11  5 - 15  ACETAMINOPHEN LEVEL      Result Value Ref Range   Acetaminophen (Tylenol), Serum <15.0  10 - 30 ug/mL  URINE RAPID DRUG SCREEN (HOSP PERFORMED)      Result Value Ref Range   Opiates NONE DETECTED  NONE DETECTED   Cocaine POSITIVE (*) NONE DETECTED   Benzodiazepines NONE DETECTED  NONE DETECTED   Amphetamines NONE DETECTED  NONE DETECTED    Tetrahydrocannabinol POSITIVE (*) NONE DETECTED   Barbiturates NONE DETECTED  NONE DETECTED  SALICYLATE LEVEL      Result Value Ref Range   Salicylate Lvl <2.0 (*) 2.8 - 20.0 mg/dL  ETHANOL      Result Value Ref Range   Alcohol, Ethyl (B) <11  0 - 11 mg/dL  PREGNANCY, URINE  Result Value Ref Range   Preg Test, Ur NEGATIVE  NEGATIVE    Imaging Review No results found.   EKG Interpretation None      MDM   Final diagnoses:  Suicide ideation  Headache(784.0)  Cocaine use  Marijuana use   Patient presents with SI and feels that she is a risk to her daughter.  Labs ordered. Patient reports headache, will treat. Labs show positive cocaine, and marijuana, medically clear. Pt meets inpatient criteria for admission. Meds given in ED:  Medications  acetaminophen (TYLENOL) tablet 650 mg (650 mg Oral Given 07/06/14 1948)  diphenhydrAMINE (BENADRYL) capsule 25 mg (25 mg Oral Given 07/07/14 0006)  acetaminophen (TYLENOL) tablet 650 mg (650 mg Oral Given 07/07/14 0006)    New Prescriptions   No medications on file    I personally performed the services described in this documentation, which was scribed in my presence. The recorded information has been reviewed and is accurate.      Clabe Seal, PA-C 07/07/14 [phone]

## 2014-07-07 ENCOUNTER — Encounter (HOSPITAL_COMMUNITY): Payer: Self-pay | Admitting: *Deleted

## 2014-07-07 ENCOUNTER — Inpatient Hospital Stay (HOSPITAL_COMMUNITY)
Admission: AD | Admit: 2014-07-07 | Discharge: 2014-07-14 | DRG: 885 | Disposition: A | Discharge status: Home / Self Care | Payer: Medicare Other | Source: Intra-hospital | Attending: Psychiatry | Admitting: Psychiatry

## 2014-07-07 DIAGNOSIS — R45850 Homicidal ideations: Secondary | ICD-10-CM

## 2014-07-07 DIAGNOSIS — F332 Major depressive disorder, recurrent severe without psychotic features: Secondary | ICD-10-CM

## 2014-07-07 DIAGNOSIS — F339 Major depressive disorder, recurrent, unspecified: Secondary | ICD-10-CM | POA: Diagnosis present

## 2014-07-07 DIAGNOSIS — F411 Generalized anxiety disorder: Secondary | ICD-10-CM | POA: Diagnosis present

## 2014-07-07 DIAGNOSIS — F319 Bipolar disorder, unspecified: Secondary | ICD-10-CM | POA: Diagnosis present

## 2014-07-07 DIAGNOSIS — F1220 Cannabis dependence, uncomplicated: Secondary | ICD-10-CM | POA: Diagnosis present

## 2014-07-07 DIAGNOSIS — G4700 Insomnia, unspecified: Secondary | ICD-10-CM | POA: Diagnosis present

## 2014-07-07 DIAGNOSIS — Z598 Other problems related to housing and economic circumstances: Secondary | ICD-10-CM

## 2014-07-07 DIAGNOSIS — Z5987 Material hardship due to limited financial resources, not elsewhere classified: Secondary | ICD-10-CM

## 2014-07-07 DIAGNOSIS — F1410 Cocaine abuse, uncomplicated: Secondary | ICD-10-CM

## 2014-07-07 DIAGNOSIS — F1420 Cocaine dependence, uncomplicated: Secondary | ICD-10-CM | POA: Diagnosis present

## 2014-07-07 DIAGNOSIS — R45851 Suicidal ideations: Secondary | ICD-10-CM

## 2014-07-07 DIAGNOSIS — Z5989 Other problems related to housing and economic circumstances: Secondary | ICD-10-CM | POA: Diagnosis not present

## 2014-07-07 DIAGNOSIS — F314 Bipolar disorder, current episode depressed, severe, without psychotic features: Secondary | ICD-10-CM

## 2014-07-07 DIAGNOSIS — F17200 Nicotine dependence, unspecified, uncomplicated: Secondary | ICD-10-CM | POA: Diagnosis present

## 2014-07-07 DIAGNOSIS — F329 Major depressive disorder, single episode, unspecified: Secondary | ICD-10-CM | POA: Diagnosis present

## 2014-07-07 LAB — RAPID URINE DRUG SCREEN, HOSP PERFORMED
Amphetamines: NOT DETECTED
BARBITURATES: NOT DETECTED
Benzodiazepines: NOT DETECTED
Cocaine: POSITIVE — AB
Opiates: NOT DETECTED
TETRAHYDROCANNABINOL: POSITIVE — AB

## 2014-07-07 LAB — PREGNANCY, URINE: PREG TEST UR: NEGATIVE

## 2014-07-07 MED ORDER — ALUM & MAG HYDROXIDE-SIMETH 200-200-20 MG/5ML PO SUSP: 30.0000 mL | ORAL | Status: DC | PRN

## 2014-07-07 MED ORDER — ACETAMINOPHEN 325 MG PO TABS
650.0000 mg | ORAL_TABLET | Freq: Four times a day (QID) | ORAL | Status: DC | PRN
  Administered 2014-07-09: 650 mg via ORAL
  Filled 2014-07-07: qty 2

## 2014-07-07 MED ORDER — DIPHENHYDRAMINE HCL 25 MG PO CAPS
25.0000 mg | ORAL_CAPSULE | Freq: Once | ORAL | Status: AC
  Administered 2014-07-07: 25 mg via ORAL
  Filled 2014-07-07: qty 1

## 2014-07-07 MED ORDER — QUETIAPINE FUMARATE 50 MG PO TABS
50.0000 mg | ORAL_TABLET | Freq: Two times a day (BID) | ORAL | Status: DC
  Administered 2014-07-07: 50 mg via ORAL
  Filled 2014-07-07: qty 1

## 2014-07-07 MED ORDER — ADULT MULTIVITAMIN W/MINERALS CH
1.0000 | ORAL_TABLET | Freq: Every day | ORAL | Status: DC
  Administered 2014-07-07 – 2014-07-14 (×8): 1 via ORAL
  Filled 2014-07-07 (×12): qty 1

## 2014-07-07 MED ORDER — ACETAMINOPHEN 325 MG PO TABS
650.0000 mg | ORAL_TABLET | ORAL | Status: DC | PRN
  Administered 2014-07-07: 650 mg via ORAL
  Filled 2014-07-07: qty 2

## 2014-07-07 MED ORDER — ADULT MULTIVITAMIN W/MINERALS CH
1.0000 | ORAL_TABLET | Freq: Every day | ORAL | Status: DC
  Administered 2014-07-07: 1 via ORAL
  Filled 2014-07-07: qty 1

## 2014-07-07 MED ORDER — QUETIAPINE FUMARATE 400 MG PO TABS
400.0000 mg | ORAL_TABLET | Freq: Every day | ORAL | Status: DC
  Administered 2014-07-07 – 2014-07-10 (×4): 400 mg via ORAL
  Filled 2014-07-07 (×2): qty 1
  Filled 2014-07-07: qty 2
  Filled 2014-07-07 (×4): qty 1

## 2014-07-07 MED ORDER — QUETIAPINE FUMARATE 50 MG PO TABS
50.0000 mg | ORAL_TABLET | Freq: Two times a day (BID) | ORAL | Status: DC
  Administered 2014-07-07 – 2014-07-09 (×4): 50 mg via ORAL
  Filled 2014-07-07 (×10): qty 1

## 2014-07-07 MED ORDER — MAGNESIUM HYDROXIDE 400 MG/5ML PO SUSP: 30.0000 mL | Freq: Every day | ORAL | Status: DC | PRN

## 2014-07-07 MED ORDER — QUETIAPINE FUMARATE 300 MG PO TABS: 400.0000 mg | ORAL_TABLET | Freq: Every day | ORAL | Status: DC

## 2014-07-07 MED ORDER — ACETAMINOPHEN 325 MG PO TABS
650.0000 mg | ORAL_TABLET | Freq: Once | ORAL | Status: AC
  Administered 2014-07-07: 650 mg via ORAL
  Filled 2014-07-07: qty 2
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
## 2014-07-07 NOTE — ED Notes (Signed)
MD at bedside. 

## 2014-07-07 NOTE — ED Notes (Signed)
Patient wanded per security; changed into blue scrubs; pocket book and clothing placed in locker 29
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
## 2014-07-07 NOTE — Progress Notes (Signed)
Adult Psychoeducational Group Note  Date:  07/07/2014 Time:  10:25 PM  Group Topic/Focus:  Wrap-Up Group:   The focus of this group is to help patients review their daily goal of treatment and discuss progress on daily workbooks.  Participation Level:  Did Not Attend  Participation Quality:  Drowsy  Affect:  Flat  Cognitive:  Disorganized and Confused  Insight: Lacking  Engagement in Group:  None  Modes of Intervention:  NONE  Additional Comments:  Pt did not come to group... Pt decide to stay in bed and sleep   Leyanna Bittman R 07/07/2014, 10:25 PM
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
## 2014-07-07 NOTE — ED Notes (Signed)
[facility]; pt stable at time of discharge; [facility]: no questions or concerns; all belongings and paperwork sent with transporter; two bags removed from locker

## 2014-07-08 DIAGNOSIS — R45851 Suicidal ideations: Secondary | ICD-10-CM

## 2014-07-08 NOTE — Progress Notes (Signed)
D: pt was in bed all evening. Minimal contact. Forwards little. Pt denies si/hi/avh and pain. Pt stated she was really tired and did not feel like being bothered A: scheduled medications brought to pt. q 15  Min safety checks R: pt remains safe on unit.no signs of stress noted.
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
## 2014-07-08 NOTE — [facility] Suicide Risk Assessment (Signed)
   Nursing information obtained from:    Demographic factors:   53 year old single female, lives with adult daughter, on disability Current Mental Status:   See below Loss Factors:   disability, recent strain with adult daughter  Historical Factors:   Long history of mood disorder, history of suicide attempts in the past, cocaine abuse Risk Reduction Factors:   coping skills Total Time spent with patient: 45 minutes  CLINICAL FACTORS:   Depression:   Anhedonia Severe  Psychiatric Specialty Exam: Physical Exam  ROS  Blood pressure 125/68, pulse 73, temperature 98 F (36.7 C), temperature source Oral, resp. rate 16, height 5' (1.524 m), weight 57.153 kg (126 lb).Body mass index is 24.61 kg/(m^2).  General Appearance: Well Groomed  Patent attorneyye Contact::  Good  Speech:  Normal Rate  Volume:  Normal  Mood:  Depressed  Affect:  mildly constricted but fully reactive affect  Thought Process:  Goal Directed and Linear  Orientation:  Full (Time, Place, and Person)  Thought Content:  denies any hallucinations, no delusions  Suicidal Thoughts:  No- denies any current suicidal or homicidal ideations, specifically denies any current plan or intention of hurting her daughter  Homicidal Thoughts:  No  Memory:  NA  Judgement:  Fair  Insight:  Fair  Psychomotor Activity:  Normal  Concentration:  Good  Recall:  Good  Fund of Knowledge:Good  Language: Good  Akathisia:  Negative  Handed:  Right  AIMS (if indicated):     Assets:  Communication Skills Desire for Improvement Social Support  Sleep:  Number of Hours: 6.75   Musculoskeletal: Strength & Muscle Tone: within normal limits Gait & Station: normal Patient leans: N/A  COGNITIVE FEATURES THAT CONTRIBUTE TO RISK:  Closed-mindedness    SUICIDE RISK:   Moderate:  Frequent suicidal ideation with limited intensity, and duration, some specificity in terms of plans, no associated intent, good self-control, limited dysphoria/symptomatology, some  risk factors present, and identifiable protective factors, including available and accessible social support.  PLAN OF CARE: Patient will be admitted to inpatient psychiatric unit for stabilization and safety. Will provide and encourage milieu participation. Provide medication management and maked adjustments as needed.  Will follow daily.    I certify that inpatient services furnished can reasonably be expected to improve the patient's condition.  Staton Markey 07/08/2014, 1:48 PM

## 2014-07-08 NOTE — Progress Notes (Signed)
Did not attend group 
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
## 2014-07-08 NOTE — Progress Notes (Addendum)
D: Patient presents with depressed affect and mood. She reported on the self inventory sheet that her sleep and appetite are fair, low energy level and poor concentration today. Patient rated depression "8", feelings of hopelessness "8" and anxiety "9". Writer observed that the patient has been lying in the bed all day besides rising to get her scheduled medications. No participation in groups. Patient adheres to current medication regimen.  A: Support and encouragement provided to patient. Administered scheduled medications per ordering MD. Monitor Q15 minute checks for safety.  R: Patient receptive. Denies SI/HI/AVH. Patient remains safe on the unit.

## 2014-07-08 NOTE — [facility] Group Notes (Signed)
[facility] LCSW Group Therapy  Emotional Regulation 1:15 - 2: 30 PM        07/08/2014     Type of Therapy:  Group Therapy  Participation Level:  Appropriate  Participation Quality:  Appropriate  Affect:  Appropriate  Cognitive:  Attentive Appropriate  Insight:  Developing/Improving Engaged  Engagement in Therapy:  Developing/Improving Engaged  Modes of Intervention:  Discussion Exploration Problem-Solving Supportive  Summary of Progress/Problems:  Group topic was emotional regulations.  Patient participated in the discussion and was able to identify an emotion that needed to regulated.  She talked about the loneliness she has from not being able to have a good relationship with her daughter.  She shared the relationship is very toxic and she has to learn to move forward without the only family member she has in the area.  Patient was able to identify approprite coping skills.  Wynn BankerHodnett, [name] 07/08/2014

## 2014-07-08 NOTE — [facility] Group Notes (Signed)
[facility] Note   07/08/2014 10:47 AM  Participation Quality:  Did not attend group.  Tanyiah Laurich, Joesph JulyQuylle Hairston

## 2014-07-09 MED ORDER — QUETIAPINE FUMARATE 50 MG PO TABS
50.0000 mg | ORAL_TABLET | Freq: Every morning | ORAL | Status: DC
  Administered 2014-07-10 – 2014-07-14 (×5): 50 mg via ORAL
  Filled 2014-07-09: qty 1
  Filled 2014-07-09: qty 14
  Filled 2014-07-09: qty 1
  Filled 2014-07-09: qty 14
  Filled 2014-07-09 (×4): qty 1

## 2014-07-09 NOTE — Progress Notes (Signed)
[name] MD Progress Note  07/09/2014 4:26 PM [name]  MRN:  [id number] Subjective:   States she is starting to feel " a little better", but still depressed. States medication is making her feel sedated Objective: Patient reports partial improvement , but remains depressed, sad, and today has described her depression as a 9/10 and reported a sense of hopelessness. She has been attending some groups, but participation is limited.No behavioral issues on unit.  We reviewed medication issues- as noted in admission note, patient states that Seroquel has been the most effective medication she has been on and that it has made much difference for her when she takes it consistently. She does state she was not taking it recently and attributes her decompensation to non compliance.  Although not currently psychotic, she states she has had clear psychotic symptoms during previous decompensations.  At this time she states it is making her somewhat drowsy/sedated. Of note, patient states she has been sober for several months, but is hoping to be tested for Hepatitis and for HIV as she feels her drug use did put her at some risk for these. States she was tested in the past but does not remember when/not recently.    Diagnosis:  MDD (major depressive disorder, recurrent episode   Total Time spent with patient: 20 minutes    ADL's:  Fair   Sleep: sleep fair, today " tired"   Appetite:  Improving   Suicidal Ideation:  Denies any current suicidal ideations Homicidal Ideation:  Denies any  homicidal ideations AEB (as evidenced by):  Psychiatric Specialty Exam: Physical Exam  Review of Systems  Constitutional: Negative for fever and chills.  Respiratory: Negative for cough and shortness of breath.   Cardiovascular: Negative for chest pain.  Neurological: Positive for headaches.  Psychiatric/Behavioral: Positive for depression.    Blood pressure 113/65, pulse 77, temperature 98.5 F (36.9 C),  temperature source Oral, resp. rate 16, height 5' (1.524 m), weight 57.153 kg (126 lb).Body mass index is 24.61 kg/(m^2).  General Appearance: Fairly Groomed  Patent attorney::  Good  Speech:  Normal Rate  Volume:  Normal  Mood:  Depressed  Affect:  Congruent and Constricted  Thought Process:  Goal Directed and Linear  Orientation:  Full (Time, Place, and Person)  Thought Content:  denies hallucinations at this time, no delusions  Suicidal Thoughts:  No- at this time denies any plan or intention of hurting herself and is able to contract for safety on the unit.  Homicidal Thoughts:  No  Memory:  NA  Judgement:  Fair  Insight:  Fair  Psychomotor Activity:  Normal  Concentration:  Good  Recall:  Good  Fund of Knowledge:Good  Language: Good  Akathisia:  Negative  Handed:  Right  AIMS (if indicated):     Assets:  Communication Skills Desire for Improvement Resilience  Sleep:  Number of Hours: 6.75   Musculoskeletal: Strength & Muscle Tone: within normal limits Gait & Station: normal Patient leans: N/A  Current Medications: Current Facility-Administered Medications  Medication Dose Route Frequency Provider Last Rate Last Dose  . acetaminophen (TYLENOL) tablet 650 mg  650 mg Oral Q6H PRN Shuvon Rankin, NP   650 mg at 07/09/14 0747  . alum & mag hydroxide-simeth (MAALOX/MYLANTA) 200-200-20 MG/5ML suspension 30 mL  30 mL Oral Q4H PRN Shuvon Rankin, NP      . magnesium hydroxide (MILK OF MAGNESIA) suspension 30 mL  30 mL Oral Daily PRN Shuvon Rankin, NP      .  multivitamin with minerals tablet 1 tablet  1 tablet Oral Daily Shuvon Rankin, NP   1 tablet at 07/09/14 0745  . QUEtiapine (SEROQUEL) tablet 400 mg  400 mg Oral QHS Shuvon Rankin, NP   400 mg at 07/08/14 2136  . QUEtiapine (SEROQUEL) tablet 50 mg  50 mg Oral BID Shuvon Rankin, NP   50 mg at 07/09/14 0745    Lab Results: No results found for this or any previous visit (from the past 48 hour(s)).  Physical Findings: AIMS:  Facial and Oral Movements Muscles of Facial Expression: None, normal Lips and Perioral Area: None, normal Jaw: None, normal Tongue: None, normal,Extremity Movements Upper (arms, wrists, hands, fingers): None, normal Lower (legs, knees, ankles, toes): None, normal, Trunk Movements Neck, shoulders, hips: None, normal, Overall Severity Severity of abnormal movements (highest score from questions above): None, normal Incapacitation due to abnormal movements: None, normal Patient's awareness of abnormal movements (rate only patient's report): No Awareness, Dental Status Current problems with teeth and/or dentures?: Yes Does patient usually wear dentures?: No  CIWA:    COWS:     Assessment: At this time patient remains somewhat depressed , but describes slow improvement. States Seroquel has been very effective for her and well tolerated, and does not want another medication. Does report a history of psychosis in the past but is currently not presenting with psychosis. Some sedation, particularly after second PM seroquel dose.  Treatment Plan Summary: Daily contact with patient to assess and evaluate symptoms and progress in treatment Medication management See below  Plan: Decrease Seroquel to 50 mgrs AM and 400 mgrs HS. As requested, will order Hep B, C, and HIV tests. Will order routine Hgb A1C and Lipid Panel as on Seroquel. Continue inpatient treatment,milieu, support  Medical Decision Making Problem Points:  Established problem, stable/improving (1) and Review of psycho-social stressors (1) Data Points:  Review or order clinical lab tests (1) Review of new medications or change in dosage (2)  I certify that inpatient services furnished can reasonably be expected to improve the patient's condition.   COBOS, FERNANDO 07/09/2014, 4:26 PM
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
## 2014-07-09 NOTE — Progress Notes (Signed)
Adult Psychoeducational Group Note  Date:  07/09/2014 Time:  8:45 AM  Group Topic/Focus:  Morning Wellness Group  Participation Level:  Active  Participation Quality:  Appropriate and Attentive  Affect:  Appropriate  Cognitive:  Alert and Oriented  Insight: Appropriate  Engagement in Group:  Engaged  Modes of Intervention:  Discussion  Additional Comments: Patient's goal is to change people, places and things once she discharges from the hospital.  Harold BarbanByrd, Hibba Schram E 07/09/2014, 11:10 AM

## 2014-07-09 NOTE — Progress Notes (Signed)
D: Patient appropriate and cooperative with staff. Patient has depressed affect and mood. She reported today on the self inventory sheet that sleep and ability to concentrate are both poor and energy level is low. Patient rates depression "9", feelings of hopelessness "10" and anxiety "0". She's participating in groups and visible in the milieu. Patient compliant with medications; c/o headache.  A: Support and encouragement provided to patient. Scheduled medications administered per MD orders. PRN Tylenol given for pain. Maintain Q15 minute checks for safety.  R: Patient receptive. Denies SI/HI. Patient remains safe.

## 2014-07-09 NOTE — Progress Notes (Signed)
Patient ID: [name], female   DOB: [birth year], 53 y.o.   MRN: [id number] D: Client visible on unit, in dayroom interacting, reports "my day been good, able to get up now, I told the doctor my medicine was to much, so he took me off of one of them" "I had them do all kind of tests so I'm doing good" client plans to go to a shelter upon discharge, "til the 3rd when my check comes"  "I can't live with that girl" "I still want to kill her". Client making reference to her daughter, but notes she did have any plan to do so. A: Writer provided emotional support commended client for knowing her triggers and encouraged her to go to a safe place. Staff will monitor q5515min for safety. R: Pt. Is safe on the unit, attended karaoke sang and danced.

## 2014-07-09 NOTE — [facility] Suicide Risk Assessment (Signed)
[facility] INPATIENT:  Family/Significant Other Suicide Prevention Education  Suicide Prevention Education:  Patient Refusal for Family/Significant Other Suicide Prevention Education: The patient [name] has refused to provide written consent for family/significant other to be provided Family/Significant Other Suicide Prevention Education during admission and/or prior to discharge.  Physician notified.  Wynn BankerHodnett, Roey Coopman Hairston 07/09/2014, 10:11 AM

## 2014-07-09 NOTE — [facility] Group Notes (Signed)
[facility] LCSW Group Therapy  [facility] 1:15 - 2:30 PM  07/09/2014 2:47 PM   Type of Therapy:  Group Therapy  Participation Level: Minimal  Participation Quality:  Minimal  Affect:  Appropriate  Cognitive:  Appropriate  Insight:  Developing/Improving  Engagement in Therapy:  Developing/Improving   Modes of Intervention:  Discussion, Education, Exploration, Problem-Solving, Rapport Building, Support   Summary of Progress/Problems:  Patient listened attentively to speaker from [facility] but made no comments on the presentation.   Wynn BankerHodnett, [name] 07/09/2014  2:47 PM

## 2014-07-10 LAB — HEPATITIS C ANTIBODY: HCV Ab: NEGATIVE

## 2014-07-10 LAB — LIPID PANEL
CHOL/HDL RATIO: 2.1 ratio
CHOLESTEROL: 180 mg/dL (ref 0–200)
HDL: 87 mg/dL (ref >39)
LDL Cholesterol: 73 mg/dL (ref 0–99)
Triglycerides: 102 mg/dL (ref <150)
VLDL: 20 mg/dL (ref 0–40)

## 2014-07-10 LAB — HEMOGLOBIN A1C
Hgb A1c MFr Bld: 5.9 % — ABNORMAL HIGH (ref <5.7)
Mean Plasma Glucose: 123 mg/dL — ABNORMAL HIGH (ref <117)

## 2014-07-10 LAB — HIV ANTIBODY (ROUTINE TESTING W REFLEX): HIV 1&2 Ab, 4th Generation: NONREACTIVE

## 2014-07-10 LAB — HEPATITIS B SURFACE ANTIGEN: Hepatitis B Surface Ag: NEGATIVE

## 2014-07-10 MED ORDER — OLANZAPINE 10 MG PO TABS
10.0000 mg | ORAL_TABLET | Freq: Three times a day (TID) | ORAL | Status: DC | PRN
  Administered 2014-07-11: 10 mg via ORAL
  Filled 2014-07-10: qty 1

## 2014-07-10 NOTE — Progress Notes (Signed)
Patient ID: [name], female   DOB: 06-17-1961, 53 y.o.   MRN: [id number]  D: Patient restless, walking quickly up and down the hallway, running her hand on the outside doors of other patients and walls as she walks by, and making noise. Pt with c/o insomnia but has not not once attempted to lay down in her bed or put on her pajamas to get ready for sleep. Pt cursing stating "I can't fucking sleep!" Pt silly acting and loud at nursing station. Pt redirected to her bed. A: Q 15 minute safety checks, encourage staff/peer interaction and group participation. Administer medications as ordered by MD.  R: Pt remains restless and difficult to redirect. During evening shift, pt sitting extremely close to a female peer and when asked to move away from him, pt argumentative with staff but eventually did move. Will continue to monitor pt.

## 2014-07-10 NOTE — Progress Notes (Signed)
D: Patient presents today with anxious affect and mood. She reported on the self inventory sheet that her sleep and ability to concentrate are both poor, good appetite and low energy level. Patient rates depression/feelings of hopelessness "9" and anxiety "5". She's attending some of the scheduled group sessions. Adheres to medication regimen.   A: Support and encouragement provided to patient. Administered scheduled medications per ordering MD. Monitor Q15 minute checks for safety.  R: Patient receptive. Denies SI/HI and AVH. Patient remains safe on the unit.

## 2014-07-10 NOTE — Progress Notes (Signed)
Patient ID: [name], female   DOB: [birth year], 53 y.o.   MRN: [id number] [facility] MD Progress Note  07/10/2014 3:55 PM [name]  MRN:  [id number] Subjective:    Objective: I have discussed case with treatment team and seen patient. States she has been going to some groups- has been interacting well with peers. Patient states she is feeling better and is feeling less tired and no longer sedated. She states she is feeling somewhat frustrated because she has been trying to get into a shelter and places have been full. She states she missed a call earlier that might have been " a place that would take me".  States " there is now way I can go home, with my daughter's issues". We reviewed labs- Hep C, Hep B, and HIV are all negative.  Hgb A1 C is slightly elevated ( 5.9), Lipid panel is normal. Non fasting glucose is 137. We discussed. Patient states her weight has been stable. She does state she " eats a lot of sweets". We discussed importance of a balanced diet and following up with a PCP- she currently does not have a PCP.   Diagnosis:  MDD (major depressive disorder, recurrent episode   Total Time spent with patient: 20 minutes    ADL's:  Fair   Sleep: sleep fair, today " tired"   Appetite:  Improving   Suicidal Ideation:  Denies any current suicidal ideations Homicidal Ideation:  Denies any  homicidal ideations AEB (as evidenced by):  Psychiatric Specialty Exam: Physical Exam  Review of Systems  Constitutional: Negative for fever and chills.  Respiratory: Negative for cough and shortness of breath.   Cardiovascular: Negative for chest pain.  Neurological: Positive for headaches.  Psychiatric/Behavioral: Positive for depression.    Blood pressure 116/61, pulse 75, temperature 98.8 F (37.1 C), temperature source Oral, resp. rate 16, height 5' (1.524 m), weight 57.153 kg (126 lb).Body mass index is 24.61 kg/(m^2).  General Appearance: Improved grooming   Eye  Contact::  Good  Speech:  Normal Rate  Volume:  Normal  Mood: Less depressed   Affect:  " frustrated", but range of affect is improved  Thought Process:  Goal Directed and Linear  Orientation:  Full (Time, Place, and Person)  Thought Content:  denies hallucinations at this time, no delusions  Suicidal Thoughts:  No- at this time denies any plan or intention of hurting herself and is able to contract for safety on the unit.  Homicidal Thoughts:  No- states " I could strangle my daughter when she makes me frustrated" , but states she says this to vent and as a figure of speech and denies any actual plan or intent of violence. Does state she wants to live away from daugther when she leaves to decrease her own stress.  Memory:  NA  Judgement:  Fair  Insight:  Fair  Psychomotor Activity:  Normal  Concentration:  Good  Recall:  Good  Fund of Knowledge:Good  Language: Good  Akathisia:  Negative  Handed:  Right  AIMS (if indicated):     Assets:  Communication Skills Desire for Improvement Resilience  Sleep:  Number of Hours: 6.75   Musculoskeletal: Strength & Muscle Tone: within normal limits Gait & Station: normal Patient leans: N/A  Current Medications: Current Facility-Administered Medications  Medication Dose Route Frequency Provider Last Rate Last Dose  . acetaminophen (TYLENOL) tablet 650 mg  650 mg Oral Q6H PRN Shuvon Rankin, NP   650 mg at 07/09/14 0747  .  alum & mag hydroxide-simeth (MAALOX/MYLANTA) 200-200-20 MG/5ML suspension 30 mL  30 mL Oral Q4H PRN Shuvon Rankin, NP      . magnesium hydroxide (MILK OF MAGNESIA) suspension 30 mL  30 mL Oral Daily PRN Shuvon Rankin, NP      . multivitamin with minerals tablet 1 tablet  1 tablet Oral Daily Shuvon Rankin, NP   1 tablet at 07/10/14 1016  . QUEtiapine (SEROQUEL) tablet 400 mg  400 mg Oral QHS Shuvon Rankin, NP   400 mg at 07/09/14 2159  . QUEtiapine (SEROQUEL) tablet 50 mg  50 mg Oral q morning - 10a Nehemiah Massed, MD   50 mg  at 07/10/14 1017    Lab Results:  Results for orders placed during the hospital encounter of 07/07/14 (from the past 48 hour(s))  HEMOGLOBIN A1C     Status: Abnormal   Collection Time    07/10/14  6:30 AM      Result Value Ref Range   Hemoglobin A1C 5.9 (*) <5.7 %   Comment: (NOTE)                                                                               According to the ADA Clinical Practice Recommendations for 2011, when     HbA1c is used as a screening test:      >=6.5%   Diagnostic of Diabetes Mellitus               (if abnormal result is confirmed)     5.7-6.4%   Increased risk of developing Diabetes Mellitus     References:Diagnosis and Classification of Diabetes Mellitus,Diabetes     Care,2011,34(Suppl 1):S62-S69 and Standards of Medical Care in             Diabetes - 2011,Diabetes Care,2011,34 (Suppl 1):S11-S61.   Mean Plasma Glucose 123 (*) <117 mg/dL   Comment: Performed at [facility]  LIPID PANEL     Status: None   Collection Time    07/10/14  6:30 AM      Result Value Ref Range   Cholesterol 180  0 - 200 mg/dL   Triglycerides 161  <096 mg/dL   HDL 87  >04 mg/dL   Total CHOL/HDL Ratio 2.1     VLDL 20  0 - 40 mg/dL   LDL Cholesterol 73  0 - 99 mg/dL   Comment:            Total Cholesterol/HDL:CHD Risk     Coronary Heart Disease Risk Table                         Men   Women      1/2 Average Risk   3.4   3.3      Average Risk       5.0   4.4      2 X Average Risk   9.6   7.1      3 X Average Risk  23.4   11.0                Use the calculated Patient Ratio     above and the CHD  Risk Table     to determine the patient's CHD Risk.                ATP III CLASSIFICATION (LDL):      <100     mg/dL   Optimal      [id number]  mg/dL   Near or Above                        Optimal      130-159  mg/dL   Borderline      [id number]  mg/dL   High      >387>190     mg/dL   Very High     Performed at [facility]  HIV ANTIBODY (ROUTINE TESTING)     Status:  None   Collection Time    07/10/14  6:30 AM      Result Value Ref Range   HIV 1&2 Ab, 4th Generation NONREACTIVE  NONREACTIVE   Comment: (NOTE)     A NONREACTIVE HIV Ag/Ab result does not exclude HIV infection since     the time frame for seroconversion is variable. If acute HIV infection     is suspected, a HIV-1 RNA Qualitative TMA test is recommended.     HIV-1/2 Antibody Diff         Not indicated.     HIV-1 RNA, Qual TMA           Not indicated.     PLEASE NOTE: This information has been disclosed to you from records     whose confidentiality may be protected by state law. If your state     requires such protection, then the state law prohibits you from making     any further disclosure of the information without the specific written     consent of the person to whom it pertains, or as otherwise permitted     by law. A general authorization for the release of medical or other     information is NOT sufficient for this purpose.     The performance of this assay has not been clinically validated in     patients less than 51 years old.     Performed at [facility]  HEPATITIS B SURFACE ANTIGEN     Status: None   Collection Time    07/10/14  6:30 AM      Result Value Ref Range   Hepatitis B Surface Ag NEGATIVE  NEGATIVE   Comment: Performed at [facility]  HEPATITIS C ANTIBODY     Status: None   Collection Time    07/10/14  6:30 AM      Result Value Ref Range   HCV Ab NEGATIVE  NEGATIVE   Comment: Performed at [facility]    Physical Findings: AIMS: Facial and Oral Movements Muscles of Facial Expression: None, normal Lips and Perioral Area: None, normal Jaw: None, normal Tongue: None, normal,Extremity Movements Upper (arms, wrists, hands, fingers): None, normal Lower (legs, knees, ankles, toes): None, normal, Trunk Movements Neck, shoulders, hips: None, normal, Overall Severity Severity of abnormal movements (highest score from questions above):  None, normal Incapacitation due to abnormal movements: None, normal Patient's awareness of abnormal movements (rate only patient's report): No Awareness, Dental Status Current problems with teeth and/or dentures?: Yes Does patient usually wear dentures?: No  CIWA:    COWS:     Assessment: Patient is improving- less depressed, still feels easily  frustrated and is focused on disposition planning and going to a shelter after discharge, as she feels she cannot go back to live with daughter ( who is alcoholic ) at this time. Labs reviewed- Hgb A1C is slightly elevated but there has been no Seroquel related weight gain. Has been on Seroquel x 7 years and as noted in prior notes, states this has been " a miracle" medication for her, and does not want to change or stop it at this time.   Treatment Plan Summary: Daily contact with patient to assess and evaluate symptoms and progress in treatment Medication management See below  Plan: Continue  Seroquel to 50 mgrs AM and 400 mgrs HS. Obtain Dietary/Nutrition Consultation to help patient with recommendations and will refer to PCP/Medical Services upon discharge Continue inpatient treatment,milieu, support Treatment Team working on disposition planning.  Medical Decision Making Problem Points:  Established problem, stable/improving (1) and Review of psycho-social stressors (1) Data Points:  Review or order clinical lab tests (1) Review of new medications or change in dosage (2)  I certify that inpatient services furnished can reasonably be expected to improve the patient's condition.   COBOS, FERNANDO 07/10/2014, 3:55 PM

## 2014-07-10 NOTE — ED Provider Notes (Signed)
Medical screening examination/treatment/procedure(s) were performed by non-physician practitioner and as supervising physician I was immediately available for consultation/collaboration.   EKG Interpretation None        Leidy Massar J. Derec Mozingo, MD 07/10/14 1459 

## 2014-07-10 NOTE — [facility] Group Notes (Signed)
[facility]   07/10/2014 12:42 PM  Participation Quality:  Patient did not attend group.   [name], [name]
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
## 2014-07-10 NOTE — [facility] Group Notes (Addendum)
[facility] LCSW Group Therapy  Feelings Around Relapse 1:15 -2:30        07/10/2014   Type of Therapy:  Group Therapy  Participation Level: Patient did not attend group.    [name] 07/10/2014         

## 2014-07-10 NOTE — Progress Notes (Signed)
[facility] Group Notes:  (Nursing/MHT/Case Management/Adjunct)  Date:  07/10/2014  Time:  10:43 PM  Type of Therapy:  Psychoeducational Skills  Participation Level:  Minimal  Participation Quality:  Inattentive  Affect:  Flat  Cognitive:  Appropriate  Insight:  Lacking  Engagement in Group:  Limited  Modes of Intervention:  Education  Summary of Progress/Problems: The patient complained in group that the boarding house that she had contacted about a bed called her three times and no one passed the message on to her. She maintains that she will continue to call different places for housing. As a theme for the day, her relapse prevention will include changing her circle of friends.   [name], [name] 07/10/2014, 10:43 PM

## 2014-07-11 MED ORDER — QUETIAPINE FUMARATE 400 MG PO TABS
400.0000 mg | ORAL_TABLET | Freq: Every day | ORAL | Status: DC
  Administered 2014-07-11 – 2014-07-13 (×3): 400 mg via ORAL
  Filled 2014-07-11 (×3): qty 1
  Filled 2014-07-11: qty 14
  Filled 2014-07-11: qty 1
  Filled 2014-07-11: qty 14

## 2014-07-11 NOTE — Progress Notes (Signed)
Patient ID: [name], female   DOB: April 21, 1961, 53 y.o.   MRN: [id number]  D: Patient pleasant and with bright affect on unit. Pt also with loud volume and hyperactivity. Pt with c/o restlessness and anxiety; PRN medications given as ordered.  A: Q 15 minute safety checks, encourage staff/peer interaction and group participation. Administer medications as ordered by MD. Encourage sharing of feelings. R: Patient compliant with medications and denies SI or plans to harm herself; pt verbally contracts for safety.

## 2014-07-11 NOTE — [facility] Group Notes (Signed)
[facility] Group Notes:  (Nursing/MHT/Case Management/Adjunct)  Date:  07/11/2014  Time:  11:27 AM  Type of Therapy:  Psychoeducational Skills  Participation Level:  Did Not Attend  Buford DresserForrest, Josian Lanese Shanta 07/11/2014, 11:27 AM

## 2014-07-11 NOTE — [facility] Group Notes (Signed)
[facility] Group Notes: (Clinical Social Work)   07/11/2014      Type of Therapy:  Group Therapy   Participation Level:  Did Not Attend    Stephanie MantleMareida Grossman-Orr, LCSW 07/11/2014, 2:27 PM

## 2014-07-11 NOTE — Progress Notes (Signed)
Patient ID: [name], female   DOB: 10-18-1961, 53 y.o.   MRN: [id number]   D: Pt has been appropriate on the unit today, she did not attend morning group due to being tired. Pt reported that she was not a morning person, that why she did not attend group. Once patient was up she attended all groups and engaged in treatment. Pt took all medications without any problems, she reported no issues or concerns. Pt reported being negative SI/HI, no AH/VH noted. A: 15 min checks continued for patient safety. R: Pt safety maintained.

## 2014-07-11 NOTE — Progress Notes (Signed)
Patient ID: [name], female   DOB: [birth year], 53 y.o.   MRN: [id number] [facility] MD Progress Note  07/11/2014 11:07 AM Siniyah Evangelist  MRN:  [id number] Subjective:   " I am all right, better than yesterday" She appears to be upset because she is not having a place to go to. She states she had an an opportunity to go to a shelter, however no one got her the message so they gave her bed away at the shelter in [place]. She is upset because she has to start all over again back to the drawing board, however she is not going to give up. She will continue to call every morning at the various facilities until they have an opening. She is inquiring about her medication adjustment for Seroquel "my night time dose doesn't work for about 2-3 hours, Im taking 400mg  I shouldn't have a problem going to sleep with that much". Currently rates her depression 10/10, anxiety 1/10, and hopelessness 10/10 at this time. Denies SI/HI/AVH.   Objective: Patient seen and chart reviewed.  States she has not been going to groups today, because she is so upset.She hopes she will receive good news. Patient states she is feeling better.She states she is feeling somewhat frustrated because she has been trying to get into a shelter and places have been full. She states she missed a call earlier that might have been " a place that would take me". States " there is now way I can go home, with my daughter's issues". Reports compliance with medication, side effect includes drowsiness.   Diagnosis:  MDD (major depressive disorder, recurrent episode   Total Time spent with patient: 20 minutes   ADL's:  Fair   Sleep: fair  Appetite:  Improving   Suicidal Ideation:  Denies any current suicidal ideations Homicidal Ideation:  Denies any  homicidal ideations AEB (as evidenced by):  Psychiatric Specialty Exam: Physical Exam   Review of Systems  Constitutional: Negative for fever and chills.  Respiratory: Negative for  cough and shortness of breath.   Cardiovascular: Negative for chest pain.  Neurological: Positive for headaches.  Psychiatric/Behavioral: Positive for depression.    Blood pressure 123/67, pulse 74, temperature 98.4 F (36.9 C), temperature source Oral, resp. rate 20, height 5' (1.524 m), weight 57.153 kg (126 lb).Body mass index is 24.61 kg/(m^2).  General Appearance: Improved grooming   Eye Contact::  Good  Speech:  Normal Rate  Volume:  Normal  Mood: Less depressed   Affect:  " frustrated"  Thought Process:  Goal Directed and Linear  Orientation:  Full (Time, Place, and Person)  Thought Content:  denies hallucinations at this time, no delusions  Suicidal Thoughts:  No- at this time denies any plan or intention of hurting herself and is able to contract for safety on the unit.  Homicidal Thoughts:  No-   Memory:  NA  Judgement:  Fair  Insight:  Fair  Psychomotor Activity:  Normal  Concentration:  Good  Recall:  Good  Fund of Knowledge:Good  Language: Good  Akathisia:  Negative  Handed:  Right  AIMS (if indicated):     Assets:  Communication Skills Desire for Improvement Resilience  Sleep:  Number of Hours: 6   Musculoskeletal: Strength & Muscle Tone: within normal limits Gait & Station: normal Patient leans: N/A  Current Medications: Current Facility-Administered Medications  Medication Dose Route Frequency Provider Last Rate Last Dose  . acetaminophen (TYLENOL) tablet 650 mg  650 mg Oral Q6H PRN Shuvon  Rankin, NP   650 mg at 07/09/14 0747  . alum & mag hydroxide-simeth (MAALOX/MYLANTA) 200-200-20 MG/5ML suspension 30 mL  30 mL Oral Q4H PRN Shuvon Rankin, NP      . magnesium hydroxide (MILK OF MAGNESIA) suspension 30 mL  30 mL Oral Daily PRN Shuvon Rankin, NP      . multivitamin with minerals tablet 1 tablet  1 tablet Oral Daily Shuvon Rankin, NP   1 tablet at 07/11/14 0933  . OLANZapine (ZYPREXA) tablet 10 mg  10 mg Oral Q8H PRN Beau Fanny, FNP      . QUEtiapine  (SEROQUEL) tablet 400 mg  400 mg Oral QHS Shuvon Rankin, NP   400 mg at 07/10/14 2048  . QUEtiapine (SEROQUEL) tablet 50 mg  50 mg Oral q morning - 10a Nehemiah Massed, MD   50 mg at 07/11/14 1610    Lab Results:  Results for orders placed during the hospital encounter of 07/07/14 (from the past 48 hour(s))  HEMOGLOBIN A1C     Status: Abnormal   Collection Time    07/10/14  6:30 AM      Result Value Ref Range   Hemoglobin A1C 5.9 (*) <5.7 %   Comment: (NOTE)                                                                               According to the ADA Clinical Practice Recommendations for 2011, when     HbA1c is used as a screening test:      >=6.5%   Diagnostic of Diabetes Mellitus               (if abnormal result is confirmed)     5.7-6.4%   Increased risk of developing Diabetes Mellitus     References:Diagnosis and Classification of Diabetes Mellitus,Diabetes     Care,2011,34(Suppl 1):S62-S69 and Standards of Medical Care in             Diabetes - 2011,Diabetes Care,2011,34 (Suppl 1):S11-S61.   Mean Plasma Glucose 123 (*) <117 mg/dL   Comment: Performed at [facility]  LIPID PANEL     Status: None   Collection Time    07/10/14  6:30 AM      Result Value Ref Range   Cholesterol 180  0 - 200 mg/dL   Triglycerides 960  <454 mg/dL   HDL 87  >09 mg/dL   Total CHOL/HDL Ratio 2.1     VLDL 20  0 - 40 mg/dL   LDL Cholesterol 73  0 - 99 mg/dL   Comment:            Total Cholesterol/HDL:CHD Risk     Coronary Heart Disease Risk Table                         Men   Women      1/2 Average Risk   3.4   3.3      Average Risk       5.0   4.4      2 X Average Risk   9.6   7.1      3 X Average Risk  23.4   11.0                Use the calculated Patient Ratio     above and the CHD Risk Table     to determine the patient's CHD Risk.                ATP III CLASSIFICATION (LDL):      <100     mg/dL   Optimal      [id number]  mg/dL   Near or Above                        Optimal       130-159  mg/dL   Borderline      [id number]  mg/dL   High      >784>190     mg/dL   Very High     Performed at [facility]  HIV ANTIBODY (ROUTINE TESTING)     Status: None   Collection Time    07/10/14  6:30 AM      Result Value Ref Range   HIV 1&2 Ab, 4th Generation NONREACTIVE  NONREACTIVE   Comment: (NOTE)     A NONREACTIVE HIV Ag/Ab result does not exclude HIV infection since     the time frame for seroconversion is variable. If acute HIV infection     is suspected, a HIV-1 RNA Qualitative TMA test is recommended.     HIV-1/2 Antibody Diff         Not indicated.     HIV-1 RNA, Qual TMA           Not indicated.     PLEASE NOTE: This information has been disclosed to you from records     whose confidentiality may be protected by state law. If your state     requires such protection, then the state law prohibits you from making     any further disclosure of the information without the specific written     consent of the person to whom it pertains, or as otherwise permitted     by law. A general authorization for the release of medical or other     information is NOT sufficient for this purpose.     The performance of this assay has not been clinically validated in     patients less than 51 years old.     Performed at [facility]  HEPATITIS B SURFACE ANTIGEN     Status: None   Collection Time    07/10/14  6:30 AM      Result Value Ref Range   Hepatitis B Surface Ag NEGATIVE  NEGATIVE   Comment: Performed at [facility]  HEPATITIS C ANTIBODY     Status: None   Collection Time    07/10/14  6:30 AM      Result Value Ref Range   HCV Ab NEGATIVE  NEGATIVE   Comment: Performed at [facility]    Physical Findings: AIMS: Facial and Oral Movements Muscles of Facial Expression: None, normal Lips and Perioral Area: None, normal Jaw: None, normal Tongue: None, normal,Extremity Movements Upper (arms, wrists, hands, fingers): None, normal Lower (legs,  knees, ankles, toes): None, normal, Trunk Movements Neck, shoulders, hips: None, normal, Overall Severity Severity of abnormal movements (highest score from questions above): None, normal Incapacitation due to abnormal movements: None, normal Patient's awareness of abnormal movements (rate only patient's report): No Awareness, Dental Status  Current problems with teeth and/or dentures?: No Does patient usually wear dentures?: No  CIWA:    COWS:     Assessment: Patient is improving- less depressed, still feels easily frustrated and is focused on disposition planning and going to a shelter after discharge, as she feels she cannot go back to live with daughter ( who is alcoholic ) at this time. Discussed with patient the importance of attending all group milieu sessions.  She likes the Seroquel, however she states the generic brand takes longer for her to work.   Treatment Plan Summary: Daily contact with patient to assess and evaluate symptoms and progress in treatment Medication management See below  Plan: Continue  Seroquel to 50 mgrs AM and 400 mg, will adjust bedtime Seroquel dose for better sleep times. Obtain Dietary/Nutrition Consultation to help patient with recommendations and will refer to PCP/Medical Services upon discharge Continue inpatient treatment,milieu, support Treatment Team working on disposition planning.  Medical Decision Making Problem Points:  Established problem, stable/improving (1) and Review of psycho-social stressors (1) Data Points:  Review or order clinical lab tests (1) Review of new medications or change in dosage (2)  I certify that inpatient services furnished can reasonably be expected to improve the patient's condition.   Malachy Chamber S FNP-BC 07/11/2014, 11:07 AM

## 2014-07-12 NOTE — Progress Notes (Signed)
Patient ID: [name] Reach, female   DOB: 1961-03-29, 53 y.o.   MRN: [id number] [facility] Progress Note  07/12/2014 12:50 PM [name] [place]  MRN:  [id number] Subjective:   " I am doing much better than yesterday. I didn't have a good day at all. I was still angry about my bed being given away. I am expecting a phone call from a [place] shelter [place]. I hope they didn't call and no one came to get me again."  She notes her mood has changed, her racing thoughts have decreased and she no longer has a headache. Currently rates her depression 10/10, anxiety 1/10, and hopelessness 10/10 at this time. Denies SI, reports + HI towards her daughter. Denies AVH.   Objective: Patient seen and chart reviewed. Pt was observed lying in bed, stating it was too hot to go outside.  States she has been going to groups today. Groups have been beneficial there were talks of relapse prevention and avoiding negativity.  Patient states she is feeling better, she is smiling much more today. States " my daughter wont stop drinking, and I cant help her stop". Reports compliance with medication, side effect includes drowsiness.   Diagnosis:  MDD (major depressive disorder, recurrent episode   Total Time spent with patient: 20 minutes   ADL's:  Fair   Sleep: fair  Appetite:  Improving   Suicidal Ideation:  Denies any current suicidal ideations Homicidal Ideation:  Denies any  homicidal ideations AEB (as evidenced by):  Psychiatric Specialty Exam: Physical Exam   Review of Systems  Constitutional: Negative for fever and chills.  Respiratory: Negative for cough and shortness of breath.   Cardiovascular: Negative for chest pain.  Neurological: Positive for headaches.  Psychiatric/Behavioral: Positive for depression.    Blood pressure 126/83, pulse 84, temperature 98.4 F (36.9 C), temperature source Oral, resp. rate 18, height 5' (1.524 m), weight 57.153 kg (126 lb).Body mass index is 24.61 kg/(m^2).   General Appearance: Improved grooming   Eye Contact::  Good  Speech:  Normal Rate  Volume:  Normal  Mood: Less depressed   Affect:  Congruent and appropriate  Thought Process:  Goal Directed and Linear  Orientation:  Full (Time, Place, and Person)  Thought Content:  denies hallucinations at this time, no delusions  Suicidal Thoughts:  No- at this time denies any plan or intention of hurting herself and is able to contract for safety on the unit.  Homicidal Thoughts:  Yes.  without intent/plan towards her daughter-   Memory:  NA  Judgement:  Fair  Insight:  Fair  Psychomotor Activity:  Normal  Concentration:  Good  Recall:  Good  Fund of Knowledge:Good  Language: Good  Akathisia:  Negative  Handed:  Right  AIMS (if indicated):     Assets:  Communication Skills Desire for Improvement Resilience  Sleep:  Number of Hours: 6.5   Musculoskeletal: Strength & Muscle Tone: within normal limits Gait & Station: normal Patient leans: N/A  Current Medications: Current Facility-Administered Medications  Medication Dose Route Frequency Provider Last Rate Last Dose  . acetaminophen (TYLENOL) tablet 650 mg  650 mg Oral Q6H PRN Shuvon Rankin, NP   650 mg at 07/09/14 0747  . alum & mag hydroxide-simeth (MAALOX/MYLANTA) 200-200-20 MG/5ML suspension 30 mL  30 mL Oral Q4H PRN Shuvon Rankin, NP      . magnesium hydroxide (MILK OF MAGNESIA) suspension 30 mL  30 mL Oral Daily PRN Shuvon Rankin, NP      .  multivitamin with minerals tablet 1 tablet  1 tablet Oral Daily Shuvon Rankin, NP   1 tablet at 07/12/14 1009  . OLANZapine (ZYPREXA) tablet 10 mg  10 mg Oral Q8H PRN Beau Fanny, FNP   10 mg at 07/11/14 1946  . QUEtiapine (SEROQUEL) tablet 400 mg  400 mg Oral QHS Truman Hayward, FNP   400 mg at 07/11/14 1946  . QUEtiapine (SEROQUEL) tablet 50 mg  50 mg Oral q morning - 10a Nehemiah Massed, MD   50 mg at 07/12/14 1010    Lab Results:  No results found for this or any previous visit (from the  past 48 hour(s)).  Physical Findings: AIMS: Facial and Oral Movements Muscles of Facial Expression: None, normal Lips and Perioral Area: None, normal Jaw: None, normal Tongue: None, normal,Extremity Movements Upper (arms, wrists, hands, fingers): None, normal Lower (legs, knees, ankles, toes): None, normal, Trunk Movements Neck, shoulders, hips: None, normal, Overall Severity Severity of abnormal movements (highest score from questions above): None, normal Incapacitation due to abnormal movements: None, normal Patient's awareness of abnormal movements (rate only patient's report): No Awareness, Dental Status Current problems with teeth and/or dentures?: No Does patient usually wear dentures?: No  CIWA:    COWS:     Assessment: Patient is improving- less depressed, still feels easily frustrated and is focused on disposition planning and going to a shelter after discharge, as she feels she cannot go back to live with daughter ( who is alcoholic ) at this time. Improved sleep with seroquel adjustment times  Treatment Plan Summary: Daily contact with patient to assess and evaluate symptoms and progress in treatment Medication management See below  Plan: Continue  Seroquel to 50 mgrs AM and 400 mg, will adjust bedtime Seroquel dose for better sleep times. Obtain Dietary/Nutrition Consultation to help patient with recommendations and will refer to PCP/Medical Services upon discharge Continue inpatient treatment,milieu, support Treatment Team working on disposition planning.  Medical Decision Making Problem Points:  Established problem, stable/improving (1) and Review of psycho-social stressors (1) Data Points:  Review or order clinical lab tests (1) Review of new medications or change in dosage (2)  I certify that inpatient services furnished can reasonably be expected to improve the patient's condition.

## 2014-07-12 NOTE — Progress Notes (Signed)
Psychoeducational Group Note  Date: 07/12/2014 Time: 1100  Group Topic/Focus:  Making Healthy Choices:   The focus of this group is to help patients identify negative/unhealthy choices they were using prior to admission and identify positive/healthier coping strategies to replace them upon discharge.  Participation Level:  Did Not Attend  PAdditional Comments:    07/12/2014,6:36 PM Rich Braveuke, Snigdha Howser Lynn

## 2014-07-12 NOTE — Progress Notes (Signed)
[facility] Group Notes:  (Nursing/MHT/Case Management/Adjunct)  Date:  07/12/2014  Time:  3:06 AM  Type of Therapy:  Psychoeducational Skills  Participation Level:  Minimal  Participation Quality:  Resistant  Affect:  Flat  Cognitive:  Lacking  Insight:  Limited  Engagement in Group:  Limited  Modes of Intervention:  Education  Summary of Progress/Problems: The patient described her day as having been "slow" overall. The patient also expressed that she didn't attend any of her groups and remained in her room for much of the morning. The patient was unable to state a support system for the theme of the day.   Travanti Mcmanus S 07/12/2014, 3:06 AM

## 2014-07-12 NOTE — Progress Notes (Signed)
[facility] Group Notes:  (Nursing/MHT/Case Management/Adjunct)  Date:  07/12/2014  Time:  9:33 PM  Type of Therapy:  Psychoeducational Skills  Participation Level:  Active  Participation Quality:  Appropriate  Affect:  Appropriate  Cognitive:  Appropriate  Insight:  Appropriate  Engagement in Group:  Engaged  Modes of Intervention:  Education  Summary of Progress/Problems: The patient mentioned that she had a good day overall and that she continues to work on her housing situation. She is optimistic about her prospects and will continue to make phone calls in the morning. Her goal for tomorrow is to continue to work on looking for housing.   Hazle CocaGOODMAN, Adilene Areola S 07/12/2014, 9:33 PM

## 2014-07-12 NOTE — [facility] Group Notes (Signed)
[facility] Group Notes:  (Nursing/MHT/Case Management/Adjunct)  Date:  07/12/2014  Time:  10:54 AM  Type of Therapy:  Psychoeducational Skills  Participation Level:  Did Not Attend   Kaitlan Bin Shanta 07/12/2014, 10:54 AM 

## 2014-07-12 NOTE — Progress Notes (Signed)
Patient ID: [name], female   DOB: [birth year], 53 y.o.   MRN: [id number]   D: Pt has been appropriate on the unit today, she did not attend morning group due to being tired. Pt reported that she was not a morning person, that why she did not attend group. Pt did report that she got a good nights sleep. Once patient was up she attended all groups and engaged in treatment. Pt took all medications without any problems, she reported no issues or concerns. Pt reported being negative SI/HI, no AH/VH noted. A: 15 min checks continued for patient safety. R: Pt safety maintained.

## 2014-07-12 NOTE — [facility] Group Notes (Signed)
[facility] Group Notes:  (Clinical Social Work)  07/12/2014   1:15-2:15PM  Summary of Progress/Problems:  The main focus of today's process group was to   identify the patient's current support system and decide on other supports that can be put in place.  The picture on workbook was used to discuss why additional supports are needed.  An emphasis was placed on using counselor, doctor, therapy groups, 12-step groups, and problem-specific support groups to expand supports.   There was also an extensive discussion about what constitutes a healthy support versus an unhealthy support.  The patient expressed full comprehension of the concepts presented.  She left after talking about the types of supports, was not present to discuss her own personal support system.  Type of Therapy:  Process Group  Participation Level:  Active  Participation Quality:  Attentive and Sharing  Affect:  Blunted  Cognitive:  Appropriate and Oriented  Insight:  Developing/Improving  Engagement in Therapy:  Engaged  Modes of Intervention:  Education,  Support and [employer]  Christabell Loseke Grossman-Orr, LCSW 07/12/2014, 4:00pm

## 2014-07-12 NOTE — Progress Notes (Signed)
D Pt. Denies SI and HI,  No complaints of pain or discomfort.  A Writer offered support and encouragement, discussed discharge plans with pt.  R Pt. Remains safe on the unit,  States she has been trying to find somewhere to stay after discharge and has received some feedback but is unsure.  Pt. Is bright and has a positive attitude this pm.

## 2014-07-13 DIAGNOSIS — F339 Major depressive disorder, recurrent, unspecified: Principal | ICD-10-CM

## 2014-07-13 DIAGNOSIS — R45850 Homicidal ideations: Secondary | ICD-10-CM

## 2014-07-13 MED ORDER — ADULT MULTIVITAMIN W/MINERALS CH: 1.0000 | ORAL_TABLET | Freq: Every day | ORAL | Status: DC

## 2014-07-13 MED ORDER — QUETIAPINE FUMARATE 400 MG PO TABS: 400.0000 mg | ORAL_TABLET | Freq: Every day | ORAL | Status: DC

## 2014-07-13 MED ORDER — QUETIAPINE FUMARATE 50 MG PO TABS: 50.0000 mg | ORAL_TABLET | Freq: Every morning | ORAL | Status: DC

## 2014-07-13 NOTE — Progress Notes (Signed)
Patient ID: [name], female   DOB: [birth year], 53 y.o.   MRN: [id number] Patient ID: [name], female   DOB: [birth year], 53 y.o.   MRN: [id number] [facility] MD Progress Note  07/13/2014 5:10 PM Gracelin Weisberg  MRN:  [id number]  Subjective:  " I am doing okay, but I'm ready to be discharged today. I rather wait and go home tomorrow".  Diagnosis:  MDD (major depressive disorder, recurrent episode  Total Time spent with patient: 20 minutes  ADL's:  Fair   Sleep: fair  Appetite:  Improving   Suicidal Ideation:  Denies any current suicidal ideations Homicidal Ideation:  Denies any  homicidal ideations AEB (as evidenced by):  Psychiatric Specialty Exam: Physical Exam  Review of Systems  Constitutional: Negative for fever and chills.  HENT: Negative.   Eyes: Negative.   Respiratory: Negative for cough and shortness of breath.   Cardiovascular: Negative for chest pain.  Gastrointestinal: Negative.   Genitourinary: Negative.   Musculoskeletal: Negative.   Neurological: Negative.   Endo/Heme/Allergies: Negative.   Psychiatric/Behavioral: Positive for depression and substance abuse (Cocaine abuse). Negative for suicidal ideas, hallucinations and memory loss. The patient is nervous/anxious and has insomnia.     Blood pressure 124/75, pulse 84, temperature 98.4 F (36.9 C), temperature source Oral, resp. rate 16, height 5' (1.524 m), weight 57.153 kg (126 lb).Body mass index is 24.61 kg/(m^2).  General Appearance: Improved grooming   Eye Contact::  Good  Speech:  Normal Rate  Volume:  Normal  Mood: Less depressed   Affect:  Congruent and appropriate  Thought Process:  Goal Directed and Linear  Orientation:  Full (Time, Place, and Person)  Thought Content:  denies hallucinations at this time, no delusions  Suicidal Thoughts:  No  Homicidal Thoughts:  Yes.  without intent/plan towards her daughter-   Memory:  NA  Judgement:  Fair  Insight:  Fair  Psychomotor  Activity:  Normal  Concentration:  Good  Recall:  Good  Fund of Knowledge:Good  Language: Good  Akathisia:  Negative  Handed:  Right  AIMS (if indicated):     Assets:  Communication Skills Desire for Improvement Resilience  Sleep:  Number of Hours: 6.5   Musculoskeletal: Strength & Muscle Tone: within normal limits Gait & Station: normal Patient leans: N/A  Current Medications: Current Facility-Administered Medications  Medication Dose Route Frequency Provider Last Rate Last Dose  . acetaminophen (TYLENOL) tablet 650 mg  650 mg Oral Q6H PRN Shuvon Rankin, NP   650 mg at 07/09/14 0747  . alum & mag hydroxide-simeth (MAALOX/MYLANTA) 200-200-20 MG/5ML suspension 30 mL  30 mL Oral Q4H PRN Shuvon Rankin, NP      . magnesium hydroxide (MILK OF MAGNESIA) suspension 30 mL  30 mL Oral Daily PRN Shuvon Rankin, NP      . multivitamin with minerals tablet 1 tablet  1 tablet Oral Daily Shuvon Rankin, NP   1 tablet at 07/13/14 0727  . OLANZapine (ZYPREXA) tablet 10 mg  10 mg Oral Q8H PRN Beau Fanny, FNP   10 mg at 07/11/14 1946  . QUEtiapine (SEROQUEL) tablet 400 mg  400 mg Oral QHS Truman Hayward, FNP   400 mg at 07/12/14 2030  . QUEtiapine (SEROQUEL) tablet 50 mg  50 mg Oral q morning - 10a Nehemiah Massed, MD   50 mg at 07/13/14 1025    Lab Results:  No results found for this or any previous visit (from the past 48 hour(s)).  Physical Findings: AIMS: Facial  and Oral Movements Muscles of Facial Expression: None, normal Lips and Perioral Area: None, normal Jaw: None, normal Tongue: None, normal,Extremity Movements Upper (arms, wrists, hands, fingers): None, normal Lower (legs, knees, ankles, toes): None, normal, Trunk Movements Neck, shoulders, hips: None, normal, Overall Severity Severity of abnormal movements (highest score from questions above): None, normal Incapacitation due to abnormal movements: None, normal Patient's awareness of abnormal movements (rate only patient's  report): No Awareness, Dental Status Current problems with teeth and/or dentures?: No Does patient usually wear dentures?: No  CIWA:    COWS:     Assessment: Patient is improving- less depressed, still feels easily frustrated and is focused on disposition planning and going to a shelter after discharge, as she feels she cannot go back to live with daughter ( who is alcoholic ) at this time. Improved mood.  Treatment Plan Summary: Daily contact with patient to assess and evaluate symptoms and progress in treatment Medication management See below  Plan: Continue Seroquel  50 mg in AM and 400 mg Q bedtime for mood control. Continue inpatient treatment,milieu, support Treatment Team working on disposition planning. Possible discharge in am.  Medical Decision Making Problem Points:  Established problem, stable/improving (1) and Review of psycho-social stressors (1) Data Points:  Review or order clinical lab tests (1) Review of new medications or change in dosage (2)  I certify that inpatient services furnished can reasonably be expected to improve the patient's condition.  Sanjuana KavaAgnes I. Nwoko, PMHNP 07/13/14
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
## 2014-07-13 NOTE — [facility] Group Notes (Signed)
[facility]  07/13/2014 8:45 AM  Participation Quality: Alert, Appropriate and Oriented  Mood/Affect: Flat   Depression Rating: 7  Anxiety Rating: 7  Thoughts of Suicide: Pt denies SI/HI  Will you contract for safety? Yes  Current AVH: Pt denies  Plan for Discharge/Comments: Pt attended discharge planning group and actively participated in group. CSW provided pt with today's workbook. Pt reports that she will be [facility].  Pt reports being unsure of living arrangements post discharge  Transportation Means: Pt reports access to transportation  Supports: No supports mentioned at this time  Chad CordialLauren Carter, LCSWA 07/13/2014 10:13 AM

## 2014-07-13 NOTE — Progress Notes (Signed)
Patient ID: [name], female   DOB: July 10, 1961, 53 y.o.   MRN: [id number]   D: Pt has been very flat and depressed on the unit today, she reported that she did not sleep well and that she has a lot on her mind regarding where she is going after she is discharged. Pt reported that she lived with her daughter prior to admission but that is not an option at this point. Pt reported that she has contacted several shelters, and is waiting to hear [place]. Pt reported being negative SI/HI, no AH/VH noted. A: 15 min checks continued for patient safety. R: Pt safety maintained.

## 2014-07-13 NOTE — Progress Notes (Signed)
Patient ID: [name], female   DOB: [birth year], 53 y.o.   MRN: [id number] D: Client visible on unit walking the halls and in day room, reports depression at "5" of 10, "I missed my bed at the shelter, I didn't get the call, so they gave it away" "I keep calling morning and evening hoping to get another one." Client reports she spoke to daughter today and says "she still not talking right, I can't stay with her she's disrespectful and I'll hurt her" "I'm still a little pissed at her."  A: Writer reviewed medications and administered as prescribed. Staff will monitor q1115min for safety. R: Pt. Is safe on the unit, attended group.

## 2014-07-14 DIAGNOSIS — F3130 Bipolar disorder, current episode depressed, mild or moderate severity, unspecified: Secondary | ICD-10-CM

## 2014-07-14 MED ORDER — QUETIAPINE FUMARATE 400 MG PO TABS: 400.0000 mg | ORAL_TABLET | Freq: Every day | ORAL | Status: DC

## 2014-07-14 MED ORDER — QUETIAPINE FUMARATE 50 MG PO TABS: 50.0000 mg | ORAL_TABLET | Freq: Every morning | ORAL | Status: DC
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
## 2014-07-14 NOTE — BHH Suicide Risk Assessment (Signed)
   Demographic Factors:  53 year old, single, has one adult daughter, and a 884 year old who lives with father. On disability.  Total Time spent with patient: 30 minutes  Psychiatric Specialty Exam: Physical Exam  ROS  Blood pressure 101/67, pulse 83, temperature 98 F (36.7 C), temperature source Oral, resp. rate 16, height 5' (1.524 m), weight 57.153 kg (126 lb).Body mass index is 24.61 kg/(m^2).  General Appearance: Well Groomed  Patent attorneyye Contact::  Good  Speech:  Normal Rate  Volume:  Normal  Mood:  Euthymic  Affect:  Appropriate  Thought Process:  Goal Directed and Linear  Orientation:  Full (Time, Place, and Person)  Thought Content:  denies hallucinations, no delusions  Suicidal Thoughts:  No- denies any suicidal or homicidal ideations, contracts for safety on the unit  Homicidal Thoughts:  No  Memory:  NA  Judgement:  Good  Insight:  Fair  Psychomotor Activity:  Normal  Concentration:  Good  Recall:  Good  Fund of Knowledge:Good  Language: Good  Akathisia:  Negative  Handed:  Right  AIMS (if indicated):     Assets:  Communication Skills Desire for Improvement Resilience  Sleep:  Number of Hours: 6.5    Musculoskeletal: Strength & Muscle Tone: within normal limits Gait & Station: normal Patient leans: N/A   Mental Status Per Nursing Assessment::   On Admission:     Current Mental Status by Physician: At this time patient calm, pleasant, well related,  not suicidal or homicidal or psychotic, future oriented  Loss Factors: Disability, and strained relationship with adult daughter, who is alcoholic  Historical Factors: Prior suicide attempts, Family history of mental illness or substance abuse and Impulsivity  Risk Reduction Factors:   Sense of responsibility to family and Positive coping skills or problem solving skills  Continued Clinical Symptoms:  Bipolar Disorder:   Depressive phase  Cognitive Features That Contribute To Risk:  No gross cognitive  deficits upon discharge  Suicide Risk:  Mild:  Suicidal ideation of limited frequency, intensity, duration, and specificity.  There are no identifiable plans, no associated intent, mild dysphoria and related symptoms, good self-control (both objective and subjective assessment), few other risk factors, and identifiable protective factors, including available and accessible social support.  Discharge Diagnoses:   AXIS I:  Bipolar, Depressed AXIS II:  Deferred AXIS III:   Past Medical History  Diagnosis Date  . Depression   . Bipolar 1 disorder   . Drug abuse    AXIS IV:  economic problems, housing problems, occupational problems and problems related to social environment AXIS V:  61-70 mild symptoms ( 60-65 upon discharge)   Plan Of Care/Follow-up recommendations:  Activity:  As tolerated  Diet:  Regular Tests:  NA Other:  See below  Is patient on multiple antipsychotic therapies at discharge:  No   Has Patient had three or more failed trials of antipsychotic monotherapy by history:  No  Recommended Plan for Multiple Antipsychotic Therapies: NA  Patient is leaving our unit in good spirits and euthymic. She is going a shelter in [place]. [name] and for medical issues referred to [facility].    [place], [place] 07/14/2014, 1:18 PM

## 2014-07-14 NOTE — Progress Notes (Signed)
Patient ID: Hollice GongKimberly Lechuga, female   DOB: Jun 10, 1961, 53 y.o.   MRN: [id number] Patient discharged per physician order; patient denies SI/HI and A/V hallucinations; patient received samples, prescriptions, and copy of AVS after it was reviewed; patient received bus passes and money for the [employer] bus; patient verbalized and signed that she received all her belongings; patient left the unit

## 2014-07-14 NOTE — Progress Notes (Signed)
[facility] :  Will you be returning to the same living situation after discharge: Yes,  to home with daughter At discharge, do you have transportation home?:Yes,  bus passes provided  Do you have the ability to pay for your medications:Yes,  Pt provided with 14-day supply of medications and a 30-day prescription  Release of information consent forms completed and in the chart;  Patient's signature needed at discharge.  Patient to Follow up at: Follow-up Information   Follow up with [facility] On 07/14/2014. (Please go to [facility]'s walk in clinic on Tuesday, July 14, 2014 or any weekday between 8AM - 3PM for medication management and counseling)    Contact information:   [address] [place], [place]   [id number]  [phone]      Patient denies SI/HI:   Yes,  yes    Safety Planning and Suicide Prevention discussed:  No. Pt refused family contact.  Elaina Hoopsarter, Ines Warf M 07/14/2014, 3:38 PM

## 2014-07-14 NOTE — BHH Group Notes (Signed)
BHH Group Notes:  orientation  Date:  07/14/2014  Time:  10:06 AM  Type of Therapy:  Nurse Education  Participation Level:  Active  Participation Quality:  Appropriate  Affect:  Appropriate  Cognitive:  Alert  Insight:  Appropriate  Engagement in Group:  Engaged  Modes of Intervention:  Discussion  Summary of Progress/Problems:  [name], [name] 07/14/2014, 10:06 AM

## 2014-07-17 NOTE — Progress Notes (Signed)
Patient Discharge Instructions:  After Visit Summary (AVS):   Faxed to:  07/17/14 Psychiatric Admission Assessment Note:   Faxed to:  07/17/14 Suicide Risk Assessment - Discharge Assessment:   Faxed to:  06/2414 Faxed/Sent to the Next Level Care provider:  07/17/14 [facility]ax[facility] to [facility] @ [phone]   Jerelene ReddenSheena E Wasatch, 07/17/2014, 3:59 PM

## 2014-07-23 ENCOUNTER — Encounter (HOSPITAL_COMMUNITY): Payer: Self-pay | Admitting: Emergency Medicine

## 2014-07-23 ENCOUNTER — Observation Stay (HOSPITAL_COMMUNITY)
Admission: AD | Admit: 2014-07-23 | Discharge: 2014-07-24 | Disposition: A | Discharge status: Home / Self Care | Payer: Medicare Other | Source: Intra-hospital | Attending: Psychiatry | Admitting: Psychiatry

## 2014-07-23 ENCOUNTER — Emergency Department (HOSPITAL_COMMUNITY)
Admission: EM | Admit: 2014-07-23 | Discharge: 2014-07-23 | Disposition: A | Discharge status: Other Acute Inpatient Hospital | Payer: Medicare Other | Source: Home / Self Care | Attending: Emergency Medicine | Admitting: Emergency Medicine

## 2014-07-23 ENCOUNTER — Encounter (HOSPITAL_COMMUNITY): Payer: Self-pay | Admitting: *Deleted

## 2014-07-23 DIAGNOSIS — F1910 Other psychoactive substance abuse, uncomplicated: Secondary | ICD-10-CM

## 2014-07-23 DIAGNOSIS — F319 Bipolar disorder, unspecified: Secondary | ICD-10-CM | POA: Insufficient documentation

## 2014-07-23 DIAGNOSIS — F329 Major depressive disorder, single episode, unspecified: Secondary | ICD-10-CM

## 2014-07-23 DIAGNOSIS — F1020 Alcohol dependence, uncomplicated: Secondary | ICD-10-CM | POA: Diagnosis not present

## 2014-07-23 DIAGNOSIS — F17200 Nicotine dependence, unspecified, uncomplicated: Secondary | ICD-10-CM | POA: Insufficient documentation

## 2014-07-23 DIAGNOSIS — F10988 Alcohol use, unspecified with other alcohol-induced disorder: Secondary | ICD-10-CM | POA: Diagnosis not present

## 2014-07-23 DIAGNOSIS — F411 Generalized anxiety disorder: Secondary | ICD-10-CM

## 2014-07-23 DIAGNOSIS — F32A Depression, unspecified: Secondary | ICD-10-CM

## 2014-07-23 DIAGNOSIS — R45850 Homicidal ideations: Secondary | ICD-10-CM | POA: Insufficient documentation

## 2014-07-23 DIAGNOSIS — F1420 Cocaine dependence, uncomplicated: Secondary | ICD-10-CM | POA: Insufficient documentation

## 2014-07-23 DIAGNOSIS — R51 Headache: Secondary | ICD-10-CM

## 2014-07-23 DIAGNOSIS — R454 Irritability and anger: Secondary | ICD-10-CM

## 2014-07-23 DIAGNOSIS — F1220 Cannabis dependence, uncomplicated: Secondary | ICD-10-CM | POA: Diagnosis not present

## 2014-07-23 DIAGNOSIS — F4320 Adjustment disorder, unspecified: Secondary | ICD-10-CM | POA: Diagnosis present

## 2014-07-23 DIAGNOSIS — Z79899 Other long term (current) drug therapy: Secondary | ICD-10-CM | POA: Insufficient documentation

## 2014-07-23 DIAGNOSIS — F3289 Other specified depressive episodes: Secondary | ICD-10-CM | POA: Insufficient documentation

## 2014-07-23 DIAGNOSIS — F1994 Other psychoactive substance use, unspecified with psychoactive substance-induced mood disorder: Secondary | ICD-10-CM | POA: Diagnosis not present

## 2014-07-23 DIAGNOSIS — F331 Major depressive disorder, recurrent, moderate: Secondary | ICD-10-CM

## 2014-07-23 LAB — BASIC METABOLIC PANEL
ANION GAP: 12 (ref 5–15)
BUN: 13 mg/dL (ref 6–23)
CALCIUM: 9.9 mg/dL (ref 8.4–10.5)
CO2: 25 mEq/L (ref 19–32)
Chloride: 104 mEq/L (ref 96–112)
Creatinine, Ser: 0.84 mg/dL (ref 0.50–1.10)
GFR calc Af Amer: >90 mL/min (ref >90)
GFR, EST NON AFRICAN AMERICAN: 79 mL/min — AB (ref >90)
Glucose, Bld: 138 mg/dL — ABNORMAL HIGH (ref 70–99)
Potassium: 4.1 mEq/L (ref 3.7–5.3)
Sodium: 141 mEq/L (ref 137–147)

## 2014-07-23 LAB — CBC WITH DIFFERENTIAL/PLATELET
BASOS ABS: 0 10*3/uL (ref 0.0–0.1)
Basophils Relative: 1 % (ref 0–1)
EOS ABS: 0.2 10*3/uL (ref 0.0–0.7)
EOS PCT: 4 % (ref 0–5)
HEMATOCRIT: 44.9 % (ref 36.0–46.0)
Hemoglobin: 14.8 g/dL (ref 12.0–15.0)
Lymphocytes Relative: 36 % (ref 12–46)
Lymphs Abs: 2 10*3/uL (ref 0.7–4.0)
MCH: 29.1 pg (ref 26.0–34.0)
MCHC: 33 g/dL (ref 30.0–36.0)
MCV: 88.2 fL (ref 78.0–100.0)
Monocytes Absolute: 0.3 10*3/uL (ref 0.1–1.0)
Monocytes Relative: 6 % (ref 3–12)
Neutro Abs: 2.8 10*3/uL (ref 1.7–7.7)
Neutrophils Relative %: 53 % (ref 43–77)
PLATELETS: 294 10*3/uL (ref 150–400)
RBC: 5.09 MIL/uL (ref 3.87–5.11)
RDW: 15 % (ref 11.5–15.5)
WBC: 5.4 10*3/uL (ref 4.0–10.5)

## 2014-07-23 LAB — SALICYLATE LEVEL: Salicylate Lvl: <2 mg/dL — ABNORMAL LOW (ref 2.8–20.0)

## 2014-07-23 LAB — ACETAMINOPHEN LEVEL: Acetaminophen (Tylenol), Serum: <15 ug/mL (ref 10–30)

## 2014-07-23 LAB — ETHANOL: Alcohol, Ethyl (B): <11 mg/dL (ref 0–11)

## 2014-07-23 MED ORDER — ADULT MULTIVITAMIN W/MINERALS CH: 1.0000 | ORAL_TABLET | Freq: Every day | ORAL | Status: DC

## 2014-07-23 MED ORDER — NICOTINE 21 MG/24HR TD PT24: 21.0000 mg | MEDICATED_PATCH | Freq: Every day | TRANSDERMAL | Status: DC

## 2014-07-23 MED ORDER — ACETAMINOPHEN 325 MG PO TABS: 650.0000 mg | ORAL_TABLET | ORAL | Status: DC | PRN

## 2014-07-23 MED ORDER — HYDROXYZINE HCL 25 MG PO TABS: 25.0000 mg | ORAL_TABLET | Freq: Three times a day (TID) | ORAL | Status: DC | PRN

## 2014-07-23 MED ORDER — IBUPROFEN 200 MG PO TABS: 600.0000 mg | ORAL_TABLET | Freq: Three times a day (TID) | ORAL | Status: DC | PRN

## 2014-07-23 MED ORDER — IBUPROFEN 600 MG PO TABS
600.0000 mg | ORAL_TABLET | Freq: Three times a day (TID) | ORAL | Status: DC | PRN
  Administered 2014-07-23 – 2014-07-24 (×2): 600 mg via ORAL
  Filled 2014-07-23 (×2): qty 1

## 2014-07-23 MED ORDER — QUETIAPINE FUMARATE 400 MG PO TABS
400.0000 mg | ORAL_TABLET | Freq: Every day | ORAL | Status: DC
  Administered 2014-07-23: 400 mg via ORAL
  Filled 2014-07-23: qty 1
  Filled 2014-07-23: qty 2
  Filled 2014-07-23: qty 1

## 2014-07-23 MED ORDER — QUETIAPINE FUMARATE 300 MG PO TABS: 400.0000 mg | ORAL_TABLET | Freq: Every day | ORAL | Status: DC

## 2014-07-23 MED ORDER — QUETIAPINE FUMARATE 50 MG PO TABS
50.0000 mg | ORAL_TABLET | Freq: Every day | ORAL | Status: DC
Start: 2014-07-24 — End: 2014-07-24
  Administered 2014-07-24: 50 mg via ORAL
  Filled 2014-07-23 (×3): qty 1

## 2014-07-23 MED ORDER — TRAZODONE HCL 50 MG PO TABS
50.0000 mg | ORAL_TABLET | Freq: Every evening | ORAL | Status: DC | PRN
  Administered 2014-07-23: 50 mg via ORAL
  Filled 2014-07-23: qty 1

## 2014-07-23 MED ORDER — QUETIAPINE FUMARATE 50 MG PO TABS: 50.0000 mg | ORAL_TABLET | Freq: Every morning | ORAL | Status: DC

## 2014-07-23 MED ORDER — LORAZEPAM 1 MG PO TABS: 1.0000 mg | ORAL_TABLET | Freq: Three times a day (TID) | ORAL | Status: DC | PRN

## 2014-07-23 MED ORDER — ALUM & MAG HYDROXIDE-SIMETH 200-200-20 MG/5ML PO SUSP: 30.0000 mL | ORAL | Status: DC | PRN

## 2014-07-23 NOTE — Progress Notes (Signed)
  CARE MANAGEMENT ED NOTE 07/23/2014  Patient:  Hollice GongSUMPTER,Anilah   Account Number:  [id number]  Date Initiated:  07/23/2014  Documentation initiated by:  Edd ArbourGIBBS,Dali  Subjective/Objective Assessment:   [age] yr [place] pt presents with worsening depression and homicidal ideations. She has a history of bipolar disorder and substance abuse. She currently resides with her adult daughter. She says her daughter     Subjective/Objective Assessment Detail:   is an alcoholic and they are not getting along at home. She says that she feels that if she stays at home she will kill her daughter.    NO pcp listed Pt confirms she does not have a pcp Agrees to a list of medicare pcps   During ED CM interaction with pt she was smiling, very pleasant, appreciative of resources offered No agitation, pacing, fast pace talking or aggression noted     Action/Plan:   CM reviewed chart review, clinicals for pt Cm spoke with pt  Pt given a list of medicare providers and a list of blue medicare providers   Action/Plan Detail:   Anticipated DC Date:  07/23/2014     Status Recommendation to Physician:   Result of Recommendation:    Other ED Services  Consult Working Plan    DC Planning Services  Other  Outpatient Services - Pt will follow up  PCP issues    Choice offered to / List presented to:            Status of service:  Completed, signed off  ED Comments:   ED Comments Detail:
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
## 2014-07-23 NOTE — BHH Counselor (Signed)
[facility] by Dr. Ladona Ridgelaylor. Patient will report to chair #4.
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
## 2014-07-23 NOTE — Progress Notes (Signed)
[facility] INPATIENT:  Family/Significant Other Suicide Prevention Education  Suicide Prevention Education:  Patient Refusal for Family/Significant Other Suicide Prevention Education: The patient [name] has refused to provide written consent for family/significant other to be provided Family/Significant Other Suicide Prevention Education during admission and/or prior to discharge.  Physician notified.  Wynona LunaBeck, Peggy Monk K 07/23/2014, 5:45 PM

## 2014-07-23 NOTE — ED Provider Notes (Addendum)
CSN: [id number]     Arrival date & time 07/23/14  1247 History   First MD Initiated Contact with Patient 07/23/14 1308     Chief Complaint  Patient presents with  . Depression     (Consider location/radiation/quality/duration/timing/severity/associated sxs/prior Treatment) HPI Comments: Patient presents with worsening depression and homicidal ideations. She has a history of bipolar disorder and substance abuse. She currently resides with her adult daughter. She says her daughter is an alcoholic and they are not getting along at home. She says that she feels that if she stays at home she will kill her daughter. She denies any suicidal ideations. She was recently admitted to behavioral health [facility] for similar symptoms. She was discharged last week. She says she is trying to get into a homeless shelter but she was unable to and had to go back home. Therefore her problems with her daughter started back up again. She denies any physical complaints other than headache which she says is from all of the yelling that she's done with her daughter today.   Past Medical History  Diagnosis Date  . Depression   . Bipolar 1 disorder   . Drug abuse    Past Surgical History  Procedure Laterality Date  . No past surgeries     Family History  Problem Relation Age of Onset  . Anesthesia problems Neg Hx   . Hypotension Neg Hx   . Malignant hyperthermia Neg Hx   . Pseudochol deficiency Neg Hx    History  Substance Use Topics  . Smoking status: Current Every Day Smoker -- 0.50 packs/day for 10 years    Types: Cigarettes  . Smokeless tobacco: Never Used  . Alcohol Use: 15.0 oz/week    25 Cans of beer per week     Comment: Weekly    OB History   Grav Para Term Preterm Abortions TAB SAB Ect Mult Living                 Review of Systems  Constitutional: Negative for fever, chills, diaphoresis and fatigue.  HENT: Negative for congestion, rhinorrhea and sneezing.   Eyes: Negative.    Respiratory: Negative for cough, chest tightness and shortness of breath.   Cardiovascular: Negative for chest pain and leg swelling.  Gastrointestinal: Negative for nausea, vomiting, abdominal pain, diarrhea and blood in stool.  Genitourinary: Negative for frequency, hematuria, flank pain and difficulty urinating.  Musculoskeletal: Negative for arthralgias and back pain.  Skin: Negative for rash.  Neurological: Positive for headaches. Negative for dizziness, speech difficulty, weakness and numbness.  Psychiatric/Behavioral: Positive for dysphoric mood. Negative for suicidal ideas. The patient is nervous/anxious.       Allergies  Review of patient's allergies indicates no known allergies.  Home Medications   Prior to Admission medications   Medication Sig Start Date End Date Taking? Authorizing Provider  Multiple Vitamin (MULTIVITAMIN WITH MINERALS) TABS tablet Take 1 tablet by mouth daily. For nutritional supplement. 07/13/14  Yes Sanjuana KavaAgnes I Nwoko, NP  QUEtiapine (SEROQUEL) 400 MG tablet Take 1 tablet (400 mg total) by mouth at bedtime. For mood control 07/14/14  Yes Beau FannyJohn C Withrow, FNP  QUEtiapine (SEROQUEL) 50 MG tablet Take 1 tablet (50 mg total) by mouth every morning. For agitation 07/14/14  Yes Beau FannyJohn C Withrow, FNP   BP 125/69  Pulse 66  Temp(Src) 98 F (36.7 C) (Oral)  Resp 18  SpO2 100% Physical Exam  Constitutional: She is oriented to person, place, and time. She appears well-developed and  well-nourished.  HENT:  Head: Normocephalic and atraumatic.  Eyes: Pupils are equal, round, and reactive to light.  Neck: Normal range of motion. Neck supple.  Cardiovascular: Normal rate, regular rhythm and normal heart sounds.   Pulmonary/Chest: Effort normal and breath sounds normal. No respiratory distress. She has no wheezes. She has no rales. She exhibits no tenderness.  Abdominal: Soft. Bowel sounds are normal. There is no tenderness. There is no rebound and no guarding.   Musculoskeletal: Normal range of motion. She exhibits no edema.  Lymphadenopathy:    She has no cervical adenopathy.  Neurological: She is alert and oriented to person, place, and time.  Skin: Skin is warm and dry. No rash noted.  Psychiatric: She has a normal mood and affect.  tearful    ED Course  Procedures (including critical care time) Labs Review Results for orders placed during the hospital encounter of 07/23/14  CBC WITH DIFFERENTIAL      Result Value Ref Range   WBC 5.4  4.0 - 10.5 K/uL   RBC 5.09  3.87 - 5.11 MIL/uL   Hemoglobin 14.8  12.0 - 15.0 g/dL   HCT 16.1  09.6 - 04.5 %   MCV 88.2  78.0 - 100.0 fL   MCH 29.1  26.0 - 34.0 pg   MCHC 33.0  30.0 - 36.0 g/dL   RDW 40.9  81.1 - 91.4 %   Platelets 294  150 - 400 K/uL   Neutrophils Relative % 53  43 - 77 %   Neutro Abs 2.8  1.7 - 7.7 K/uL   Lymphocytes Relative 36  12 - 46 %   Lymphs Abs 2.0  0.7 - 4.0 K/uL   Monocytes Relative 6  3 - 12 %   Monocytes Absolute 0.3  0.1 - 1.0 K/uL   Eosinophils Relative 4  0 - 5 %   Eosinophils Absolute 0.2  0.0 - 0.7 K/uL   Basophils Relative 1  0 - 1 %   Basophils Absolute 0.0  0.0 - 0.1 K/uL  BASIC METABOLIC PANEL      Result Value Ref Range   Sodium 141  137 - 147 mEq/L   Potassium 4.1  3.7 - 5.3 mEq/L   Chloride 104  96 - 112 mEq/L   CO2 25  19 - 32 mEq/L   Glucose, Bld 138 (*) 70 - 99 mg/dL   BUN 13  6 - 23 mg/dL   Creatinine, Ser 7.82  0.50 - 1.10 mg/dL   Calcium 9.9  8.4 - 95.6 mg/dL   GFR calc non Af Amer 79 (*) >90 mL/min   GFR calc Af Amer >90  >90 mL/min   Anion gap 12  5 - 15  ETHANOL      Result Value Ref Range   Alcohol, Ethyl (B) <11  0 - 11 mg/dL  SALICYLATE LEVEL      Result Value Ref Range   Salicylate Lvl <2.0 (*) 2.8 - 20.0 mg/dL  ACETAMINOPHEN LEVEL      Result Value Ref Range   Acetaminophen (Tylenol), Serum <15.0  10 - 30 ug/mL   No results found.    Imaging Review No results found.   EKG Interpretation None      MDM   Final  diagnoses:  Depression    Will check screening labs, TTS consult, move to psych ED    Rolan Bucco, MD 07/23/14 1340  Rolan Bucco, MD 07/23/14 [phone]
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
## 2014-07-24 DIAGNOSIS — F1994 Other psychoactive substance use, unspecified with psychoactive substance-induced mood disorder: Secondary | ICD-10-CM

## 2014-07-24 DIAGNOSIS — F1920 Other psychoactive substance dependence, uncomplicated: Secondary | ICD-10-CM

## 2014-07-24 DIAGNOSIS — F319 Bipolar disorder, unspecified: Secondary | ICD-10-CM
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
## 2014-07-24 NOTE — [facility] Suicide Risk Assessment (Signed)
Suicide Risk Assessment  Discharge Assessment     Demographic Factors:  Divorced or widowed and SUBSTANCE ABUSE  Total Time spent with patient: 30 minutes  Psychiatric Specialty Exam:     Blood pressure 121/66, pulse 68, temperature 98.5 F (36.9 C), temperature source Oral, resp. rate 16, height 5' (1.524 m), weight 61.236 kg (135 lb), SpO2 100.00%.Body mass index is 26.37 kg/(m^2).  General Appearance: Fairly Groomed  Patent attorneyye Contact::  Good  Speech:  Clear and Coherent  Volume:  Normal  Mood:  VARIABLE  Affect:  Congruent  Thought Process:  Logical  Orientation:  Full (Time, Place, and Person)  Thought Content:  WDL  Suicidal Thoughts:  No  Homicidal Thoughts:  Yes.  with intent/plan WOULD LIKE TO CHOKE HER DAUGHETR  Memory:  Negative  Judgement:  Impaired  Insight:  Fair  Psychomotor Activity:  Normal  Concentration:  Good  Recall:  Good  Fund of Knowledge:Good  Language: Good  Akathisia:  Negative  Handed:  Right  AIMS (if indicated):  NA  Assets:  Communication Skills Desire for Improvement Financial Resources/Insurance Transportation  Sleep:  DEPRIVED BY Johnson ControlsLINVING SITUATION AND RECENT RELAPSE    Musculoskeletal: Strength & Muscle Tone: within normal limits Gait & Station: normal Patient leans: N/A   Mental Status Per Nursing Assessment::   On Admission:  Thoughts of violence towards others  Current Mental Status by Physician: NA-SEE MSE  Loss Factors: NA  Historical Factors: Prior suicide attempts  Risk Reduction Factors:   Positive social support  Continued Clinical Symptoms:  Alcohol/Substance Abuse/Dependencies  Cognitive Features That Contribute To Risk:  Polarized thinking    Suicide Risk:  Minimal: No identifiable suicidal ideation.  Patients presenting with no risk factors but with morbid ruminations; may be classified as minimal risk based on the severity of the depressive symptoms  Discharge Diagnoses:   AXIS I:  POLYSUBSTANCE  DEPENDENCE;HX OF BIPOLAR DO; AXIS II:  DEFERRED AXIS III:   Past Medical History  Diagnosis Date  . Depression   . Bipolar 1 disorder   . Drug abuse    AXIS IV:  housing problems and problems with primary support group AXIS V:  50-60  Plan Of Care/Follow-up recommendations:  Activity:  STOP USING Diet:  HEART HEALTHY Other:  ESTABLISH WITH PCP   Is patient on multiple antipsychotic therapies at discharge:  No   Has Patient had three or more failed trials of antipsychotic monotherapy by history:  No  Recommended Plan for Multiple Antipsychotic Therapies: NA    Niaja Stickley E 07/24/2014, 3:04 PM
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
## 2014-07-24 NOTE — Plan of Care (Signed)
[facility] Observation Crisis Plan  Reason for Crisis Plan:  Crisis Stabilization   Plan of Care:  [facility]erral to [facility] or equivalent provider for routine outpatient treatment  Family Support:    None locally; pt has family in [place] that she will continue to try to reach  Current Living Environment:  Living Arrangements: Children; pt has been living with her adult daughter, but due to ongoing conflict with her, will not be returning to the household.  She has made arrangements to go to [facility] for the [place].  Insurance:   Hospital Account   Name Acct ID Class Status Primary Coverage   Hollice GongSumpter, Clorissa [id number] BEHAVIORAL HEALTH OBSERVATION Open MEDICARE - MEDICARE PART A AND B        Guarantor Account (for Hospital Account [id number])   Name Relation to Pt Service Area Active? Acct Type   Hollice GongSumpter, Kealie Self [facility] Yes Behavioral Health   Address Phone       [address] [place], [place] [address] [phone])          Coverage Information (for Hospital Account [id number])   F/O Payor/Plan Precert #   MEDICARE/MEDICARE PART A AND B    Subscriber Subscriber #   Hollice GongSumpter, Azoria [id number]   Address Phone   [address] [place], [place] [id number]       Legal Guardian:   Self  Primary Care Provider:  No PCP Per Patient  Current Out[facility] Providers:  [facility]  [facility]iatrist:   [facility]  Counselor/Therapist:   [facility]  Compliant with Medications:  Yes; pt did not take Seroquel on 07/22/14, but is otherwise compliant.  She has necessary prescriptions.  Additional Information: After consulting with Maryjean Mornharles Kober, PA it has been determined that, provided she is able to contract for no violence, pt does not present a life threatening danger to herself or others, and that psychiatric hospitalization is not indicated for her at this time.  Pt signed No Violence contract.  Due to her ongoing conflict with her adult daughter pt will  not be returning to their shared household.  Arrangements have been made for her to go to [facility] [place] [facility] [place] the [place].  Thereafter, she has the means to find her own residence, or to contact her family in [place].  For now, her car and other belongings are at the daughter's household, where she agrees not to go.  Pt will be given a [place] bus pass, as well as $2.50 for PART transportation.  Her discharge instructions will include contact information for [facility], so that she can non-urgently reach law enforcement to arrange to safely retrieve her personal belongings.  Pt is a regular client at [facility], where she will be referred for her ongoing behavioral health needs, but she will also be given contact information for [facility] in case she ends up remaining in the [facility] [place] area.   Raphael GibneyHughes, Jacalynn Buzzell Patrick 7/31/20152:03 PM
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
## 2014-07-28 NOTE — Discharge Summary (Signed)
Case discussed, agree with plan 
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
## 2015-01-07 ENCOUNTER — Encounter (HOSPITAL_COMMUNITY): Payer: Self-pay | Admitting: Nurse Practitioner

## 2015-01-07 ENCOUNTER — Emergency Department (EMERGENCY_DEPARTMENT_HOSPITAL)
Admission: EM | Admit: 2015-01-07 | Discharge: 2015-01-08 | Disposition: A | Discharge status: Home / Self Care | Payer: Medicare Other | Source: Home / Self Care | Attending: Emergency Medicine | Admitting: Emergency Medicine

## 2015-01-07 DIAGNOSIS — F319 Bipolar disorder, unspecified: Secondary | ICD-10-CM

## 2015-01-07 DIAGNOSIS — K088 Other specified disorders of teeth and supporting structures: Secondary | ICD-10-CM | POA: Diagnosis not present

## 2015-01-07 DIAGNOSIS — K029 Dental caries, unspecified: Secondary | ICD-10-CM

## 2015-01-07 DIAGNOSIS — G479 Sleep disorder, unspecified: Secondary | ICD-10-CM

## 2015-01-07 DIAGNOSIS — F1410 Cocaine abuse, uncomplicated: Secondary | ICD-10-CM | POA: Insufficient documentation

## 2015-01-07 DIAGNOSIS — F329 Major depressive disorder, single episode, unspecified: Secondary | ICD-10-CM | POA: Diagnosis not present

## 2015-01-07 DIAGNOSIS — R45851 Suicidal ideations: Secondary | ICD-10-CM

## 2015-01-07 DIAGNOSIS — F1210 Cannabis abuse, uncomplicated: Secondary | ICD-10-CM

## 2015-01-07 DIAGNOSIS — R45850 Homicidal ideations: Secondary | ICD-10-CM | POA: Diagnosis not present

## 2015-01-07 DIAGNOSIS — Z720 Tobacco use: Secondary | ICD-10-CM | POA: Insufficient documentation

## 2015-01-07 DIAGNOSIS — F4321 Adjustment disorder with depressed mood: Secondary | ICD-10-CM | POA: Diagnosis not present

## 2015-01-07 DIAGNOSIS — K047 Periapical abscess without sinus: Secondary | ICD-10-CM | POA: Insufficient documentation

## 2015-01-07 DIAGNOSIS — K0889 Other specified disorders of teeth and supporting structures: Secondary | ICD-10-CM

## 2015-01-07 DIAGNOSIS — Z79899 Other long term (current) drug therapy: Secondary | ICD-10-CM | POA: Insufficient documentation

## 2015-01-07 LAB — ETHANOL: Alcohol, Ethyl (B): <5 mg/dL (ref 0–9)

## 2015-01-07 LAB — CBC WITH DIFFERENTIAL/PLATELET
Basophils Absolute: 0 10*3/uL (ref 0.0–0.1)
Basophils Relative: 1 % (ref 0–1)
EOS ABS: 0.3 10*3/uL (ref 0.0–0.7)
Eosinophils Relative: 5 % (ref 0–5)
HCT: 45.7 % (ref 36.0–46.0)
Hemoglobin: 14.4 g/dL (ref 12.0–15.0)
LYMPHS PCT: 43 % (ref 12–46)
Lymphs Abs: 2.6 10*3/uL (ref 0.7–4.0)
MCH: 29.3 pg (ref 26.0–34.0)
MCHC: 31.5 g/dL (ref 30.0–36.0)
MCV: 92.9 fL (ref 78.0–100.0)
Monocytes Absolute: 0.6 10*3/uL (ref 0.1–1.0)
Monocytes Relative: 10 % (ref 3–12)
NEUTROS ABS: 2.4 10*3/uL (ref 1.7–7.7)
Neutrophils Relative %: 41 % — ABNORMAL LOW (ref 43–77)
PLATELETS: 276 10*3/uL (ref 150–400)
RBC: 4.92 MIL/uL (ref 3.87–5.11)
RDW: 15.2 % (ref 11.5–15.5)
WBC: 5.9 10*3/uL (ref 4.0–10.5)

## 2015-01-07 LAB — COMPREHENSIVE METABOLIC PANEL
ALK PHOS: 45 U/L (ref 39–117)
ALT: 17 U/L (ref 0–35)
AST: 22 U/L (ref 0–37)
Albumin: 2.9 g/dL — ABNORMAL LOW (ref 3.5–5.2)
Anion gap: 3 — ABNORMAL LOW (ref 5–15)
BILIRUBIN TOTAL: 0.2 mg/dL — AB (ref 0.3–1.2)
BUN: 18 mg/dL (ref 6–23)
CO2: 29 mmol/L (ref 19–32)
CREATININE: 0.95 mg/dL (ref 0.50–1.10)
Calcium: 8.2 mg/dL — ABNORMAL LOW (ref 8.4–10.5)
Chloride: 109 mEq/L (ref 96–112)
GFR calc Af Amer: 79 mL/min — ABNORMAL LOW (ref >90)
GFR calc non Af Amer: 68 mL/min — ABNORMAL LOW (ref >90)
GLUCOSE: 70 mg/dL (ref 70–99)
Potassium: 3.9 mmol/L (ref 3.5–5.1)
SODIUM: 141 mmol/L (ref 135–145)
Total Protein: 5.3 g/dL — ABNORMAL LOW (ref 6.0–8.3)

## 2015-01-07 LAB — ACETAMINOPHEN LEVEL: Acetaminophen (Tylenol), Serum: <10 ug/mL — ABNORMAL LOW (ref 10–30)

## 2015-01-07 LAB — SALICYLATE LEVEL

## 2015-01-07 MED ORDER — LORAZEPAM 1 MG PO TABS
1.0000 mg | ORAL_TABLET | Freq: Three times a day (TID) | ORAL | Status: DC | PRN
  Administered 2015-01-08: 1 mg via ORAL
  Filled 2015-01-07: qty 1

## 2015-01-07 MED ORDER — PENICILLIN V POTASSIUM 500 MG PO TABS
500.0000 mg | ORAL_TABLET | Freq: Four times a day (QID) | ORAL | Status: DC
  Administered 2015-01-07 – 2015-01-08 (×3): 500 mg via ORAL
  Filled 2015-01-07 (×3): qty 1

## 2015-01-07 MED ORDER — QUETIAPINE FUMARATE 400 MG PO TABS: 400.0000 mg | ORAL_TABLET | Freq: Every day | ORAL | Status: DC

## 2015-01-07 MED ORDER — PENICILLIN V POTASSIUM 500 MG PO TABS: 500.0000 mg | ORAL_TABLET | Freq: Four times a day (QID) | ORAL | Status: DC

## 2015-01-07 MED ORDER — QUETIAPINE FUMARATE 50 MG PO TABS: 50.0000 mg | ORAL_TABLET | ORAL | Status: DC

## 2015-01-07 MED ORDER — ACETAMINOPHEN 325 MG PO TABS
650.0000 mg | ORAL_TABLET | ORAL | Status: DC | PRN
  Administered 2015-01-08: 650 mg via ORAL
  Filled 2015-01-07: qty 2

## 2015-01-07 MED ORDER — ONDANSETRON HCL 4 MG PO TABS
4.0000 mg | ORAL_TABLET | Freq: Three times a day (TID) | ORAL | Status: DC | PRN
Start: 2015-01-07 — End: 2015-01-08

## 2015-01-07 NOTE — ED Provider Notes (Signed)
CSN: [id number]     Arrival date & time 01/07/15  1728 History  This chart was scribed for Quincy Carnes, PA-C with Arbie Cookey, MD by Edison Simon, ED Scribe. This patient was seen in room WTR5/WTR5 and the patient's care was started at 6:17 PM.    Chief Complaint  Patient presents with  . Medication Refill  . Dental Pain  . Depression   The history is provided by the patient. No language interpreter was used.    HPI Comments: [name] is a 54 y.o. female who presents to the Emergency Department for medication refill of Seroquel. She states she lost her medication 1 week ago and since then has had some suicidal ideation since then. States she has no specific plan and does not actually want to hurt herself but she can't stop the thoughts.  She notes that she typically hears voices after 1 week of not taking her medication, but states that has not started yet. She reports associated difficulty sleeping. She states she is seen at [facility]. She denies HI or visual or auditory hallucinations.  She also complains of right upper dental pain.  States this has been ongoing for the past few days.  Denies fever, chills, facial swelling, or difficulty swallowing.  Patient states she did not call her dentist yet because she was concerned she was getting an "infection" and they could not pull her tooth with an infection going on so she came here first.  Past Medical History  Diagnosis Date  . Depression   . Bipolar 1 disorder   . Drug abuse    Past Surgical History  Procedure Laterality Date  . No past surgeries     Family History  Problem Relation Age of Onset  . Anesthesia problems Neg Hx   . Hypotension Neg Hx   . Malignant hyperthermia Neg Hx   . Pseudochol deficiency Neg Hx    History  Substance Use Topics  . Smoking status: Current Every Day Smoker -- 0.50 packs/day for 10 years    Types: Cigarettes  . Smokeless tobacco: Never Used  . Alcohol Use: 15.0 oz/week    25  Cans of beer per week     Comment: Weekly    OB History    No data available     Review of Systems  HENT: Positive for dental problem.   Psychiatric/Behavioral: Positive for suicidal ideas and sleep disturbance. Negative for hallucinations.  All other systems reviewed and are negative.     Allergies  Review of patient's allergies indicates no known allergies.  Home Medications   Prior to Admission medications   Medication Sig Start Date End Date Taking? Authorizing Provider  Multiple Vitamin (MULTIVITAMIN WITH MINERALS) TABS tablet Take 1 tablet by mouth daily. For nutritional supplement. 07/13/14   Encarnacion Slates, NP  QUEtiapine (SEROQUEL) 400 MG tablet Take 1 tablet (400 mg total) by mouth at bedtime. For mood control 07/14/14   Benjamine Mola, FNP  QUEtiapine (SEROQUEL) 50 MG tablet Take 1 tablet (50 mg total) by mouth every morning. For agitation 07/14/14   Elyse Jarvis Withrow, FNP   BP 120/55 mmHg  Pulse 91  Temp(Src) 98.1 F (36.7 C) (Oral)  Resp 14  Ht 5' (1.524 m)  Wt 120 lb (54.432 kg)  BMI 23.44 kg/m2  SpO2 99%   Physical Exam  Constitutional: She is oriented to person, place, and time. She appears well-developed and well-nourished. No distress.  HENT:  Head: Normocephalic and  atraumatic.  Mouth/Throat: Oropharynx is clear and moist.  Teeth largely in poor dentition, multiple teeth missing, right upper molar broken with cavity present, surrounding gingiva with what appears to be early dental abscess; no appreciable fluid collection or drainable abscess; handling secretions appropriately, no trismus Currently eating a Kitkat and drinking Pepsi  Eyes: Conjunctivae and EOM are normal. Pupils are equal, round, and reactive to light.  Neck: Normal range of motion. Neck supple.  Cardiovascular: Normal rate, regular rhythm and normal heart sounds.   Pulmonary/Chest: Effort normal and breath sounds normal. No respiratory distress. She has no wheezes.  Abdominal: Soft. Bowel  sounds are normal. There is no tenderness. There is no guarding.  Musculoskeletal: Normal range of motion. She exhibits no edema.  Neurological: She is alert and oriented to person, place, and time.  Skin: Skin is warm and dry. She is not diaphoretic.  Psychiatric: She has a normal mood and affect.  Endorses some intermittent SI without plan; denies HI/AVH  Nursing note and vitals reviewed.   ED Course  Procedures (including critical care time)  DIAGNOSTIC STUDIES: Oxygen Saturation is 99% on room air, normal by my interpretation.    COORDINATION OF CARE: 6:21 PM Discussed treatment plan with patient at beside, the patient agrees with the plan and has no further questions at this time.   Labs Review Labs Reviewed  CBC WITH DIFFERENTIAL  COMPREHENSIVE METABOLIC PANEL  ETHANOL  URINE RAPID DRUG SCREEN (HOSP PERFORMED)  ACETAMINOPHEN LEVEL  SALICYLATE LEVEL    Imaging Review No results found.   EKG Interpretation None      MDM   Final diagnoses:  Pain, dental  Suicidal ideation   54 y.o. F with multiple complaints.   1-- Dental pain with possibly developing early dental abscess, no appreciable fluctuance or drainable fluid collection noted on exam.  Patient handling secretions well, currently eating kit kat and drinking pepsi.   2-- SI.  Patient states this has been intermittent since being off her seroquel for the past week.  She has no specific plan and does not truly want to hurt herself but is having difficulties controlling her thoughts.  Will discuss with TTS.  7:41 PM Case discussed with Coralyn Mark from TTS- feels reasonable to refill patient meds, contract for safety, and have her follow-up with [facility].  Dr. Doy Mince to evaluate patient.  8:06 PM Dr. Doy Mince has met with patient-- patient now stating that she is having thoughts of slitting her wrist.  Will obtain labs and have TTS formally evaluate.    8:16 PM Lab work pending at this time.  Care signed out to [place]-- will follow lab results and medically clear.  Patient started on penicillin for potential dental infection.  I personally performed the services described in this documentation, which was scribed in my presence. The recorded information has been reviewed and is accurate.  Larene Pickett, PA-C 01/07/15 2017  Houston Siren III, MD 01/08/15 Dyann Kief
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
## 2015-01-07 NOTE — ED Notes (Signed)
Pt unable to void at this time, 

## 2015-01-07 NOTE — BHH Counselor (Signed)
This Clinical research associatewriter spoke with Sharilyn SitesLisa Sanders, PA, stated that she felt pt was stable enough to contract for safety and she would refill her medication--seroquel.  Pt can follow up with her provider--[facility] for outpatient services.  This Clinical research associatewriter informed PA to call [facility] and ask them for safety contract and addt'l referrals if needed.  Also asked PA to remove TTS consult.  PA to comply with request.

## 2015-01-07 NOTE — ED Provider Notes (Signed)
Medical screening examination/treatment/procedure(s) were conducted as a shared visit with non-physician practitioner(s) and myself.  I personally evaluated the patient during the encounter.   EKG Interpretation None      54 yo female with hx of Bipolar who presents with anxiety, substance abuse, and suicidal ideation. She initially stated that her primary reason for coming mostly put back on her medications. However, ultimately, she endorses that her suicidal ideations were more significant than previously thought, including a plan to overdose or cut wrists.  On exam, well appearing, nontoxic, not distressed, normal respiratory effort, normal perfusion, wringing her hands.  Plan TTS evaluation.  Clinical Impression: 1. Pain, dental   2. Suicidal ideation       Candyce ChurnJohn David [name] III, MD 01/07/15 [phone]

## 2015-01-07 NOTE — ED Notes (Signed)
Pt states she is out of her seroquel for a week, been depressed as a result making her feel "a little suicidal" Also c/o of tooth ache 10/10.

## 2015-01-08 ENCOUNTER — Observation Stay (HOSPITAL_COMMUNITY): Admission: EM | Admit: 2015-01-08 | Payer: Medicare Other | Source: Intra-hospital | Admitting: Psychiatry

## 2015-01-08 ENCOUNTER — Inpatient Hospital Stay (HOSPITAL_COMMUNITY)
Admission: AD | Admit: 2015-01-08 | Discharge: 2015-01-14 | DRG: 885 | Disposition: A | Discharge status: Home / Self Care | Payer: Medicare Other | Source: Intra-hospital | Attending: Psychiatry | Admitting: Psychiatry

## 2015-01-08 ENCOUNTER — Encounter (HOSPITAL_COMMUNITY): Payer: Self-pay | Admitting: *Deleted

## 2015-01-08 DIAGNOSIS — Z609 Problem related to social environment, unspecified: Secondary | ICD-10-CM | POA: Diagnosis present

## 2015-01-08 DIAGNOSIS — Z639 Problem related to primary support group, unspecified: Secondary | ICD-10-CM

## 2015-01-08 DIAGNOSIS — F319 Bipolar disorder, unspecified: Secondary | ICD-10-CM | POA: Diagnosis present

## 2015-01-08 DIAGNOSIS — R45851 Suicidal ideations: Secondary | ICD-10-CM | POA: Diagnosis present

## 2015-01-08 DIAGNOSIS — F17210 Nicotine dependence, cigarettes, uncomplicated: Secondary | ICD-10-CM | POA: Diagnosis present

## 2015-01-08 DIAGNOSIS — F329 Major depressive disorder, single episode, unspecified: Secondary | ICD-10-CM

## 2015-01-08 DIAGNOSIS — G4700 Insomnia, unspecified: Secondary | ICD-10-CM | POA: Diagnosis present

## 2015-01-08 DIAGNOSIS — F4321 Adjustment disorder with depressed mood: Secondary | ICD-10-CM

## 2015-01-08 DIAGNOSIS — R45850 Homicidal ideations: Secondary | ICD-10-CM | POA: Diagnosis present

## 2015-01-08 DIAGNOSIS — F411 Generalized anxiety disorder: Secondary | ICD-10-CM | POA: Diagnosis present

## 2015-01-08 DIAGNOSIS — F1420 Cocaine dependence, uncomplicated: Secondary | ICD-10-CM | POA: Diagnosis present

## 2015-01-08 DIAGNOSIS — F3110 Bipolar disorder, current episode manic without psychotic features, unspecified: Secondary | ICD-10-CM

## 2015-01-08 DIAGNOSIS — F1220 Cannabis dependence, uncomplicated: Secondary | ICD-10-CM

## 2015-01-08 LAB — RAPID URINE DRUG SCREEN, HOSP PERFORMED
Amphetamines: NOT DETECTED
BARBITURATES: NOT DETECTED
Benzodiazepines: NOT DETECTED
COCAINE: POSITIVE — AB
Opiates: NOT DETECTED
TETRAHYDROCANNABINOL: POSITIVE — AB

## 2015-01-08 MED ORDER — HYDROXYZINE HCL 25 MG PO TABS
25.0000 mg | ORAL_TABLET | Freq: Four times a day (QID) | ORAL | Status: DC | PRN
Start: 2015-01-08 — End: 2015-01-14
  Filled 2015-01-08: qty 6

## 2015-01-08 MED ORDER — ACETAMINOPHEN 325 MG PO TABS
650.0000 mg | ORAL_TABLET | Freq: Four times a day (QID) | ORAL | Status: DC | PRN
  Administered 2015-01-09: 650 mg via ORAL

## 2015-01-08 MED ORDER — TRAZODONE HCL 50 MG PO TABS
50.0000 mg | ORAL_TABLET | Freq: Every evening | ORAL | Status: DC | PRN
  Filled 2015-01-08: qty 1

## 2015-01-08 MED ORDER — BENZOCAINE 10 % MT GEL: Freq: Four times a day (QID) | OROMUCOSAL | Status: DC | PRN

## 2015-01-08 MED ORDER — MAGNESIUM HYDROXIDE 400 MG/5ML PO SUSP: 30.0000 mL | Freq: Every day | ORAL | Status: DC | PRN

## 2015-01-08 MED ORDER — IBUPROFEN 600 MG PO TABS
600.0000 mg | ORAL_TABLET | Freq: Four times a day (QID) | ORAL | Status: DC | PRN
  Administered 2015-01-08 – 2015-01-13 (×5): 600 mg via ORAL
  Filled 2015-01-08 (×5): qty 1

## 2015-01-08 MED ORDER — PENICILLIN V POTASSIUM 500 MG PO TABS
500.0000 mg | ORAL_TABLET | Freq: Four times a day (QID) | ORAL | Status: DC
  Administered 2015-01-08 – 2015-01-14 (×24): 500 mg via ORAL
  Filled 2015-01-08: qty 2
  Filled 2015-01-08: qty 1
  Filled 2015-01-08 (×2): qty 2
  Filled 2015-01-08 (×8): qty 1
  Filled 2015-01-08: qty 2
  Filled 2015-01-08 (×3): qty 1
  Filled 2015-01-08: qty 2
  Filled 2015-01-08: qty 1
  Filled 2015-01-08: qty 2
  Filled 2015-01-08 (×5): qty 1
  Filled 2015-01-08: qty 2
  Filled 2015-01-08 (×9): qty 1
  Filled 2015-01-08: qty 2
  Filled 2015-01-08 (×4): qty 1

## 2015-01-08 MED ORDER — ALUM & MAG HYDROXIDE-SIMETH 200-200-20 MG/5ML PO SUSP
30.0000 mL | ORAL | Status: DC | PRN
  Administered 2015-01-11: 30 mL via ORAL
  Filled 2015-01-08: qty 30

## 2015-01-08 NOTE — ED Notes (Signed)
[place] at bedside. 

## 2015-01-08 NOTE — ED Notes (Signed)
Sandwich and sprite provided to the patient until meal tray arrives.

## 2015-01-08 NOTE — ED Notes (Signed)
[facility] given to Scientist, physiologicalatty [facility].

## 2015-01-08 NOTE — BHH Counselor (Signed)
Support paperwork completed voluntary ppwk signed by pt for [facility]  Riggs Dineen, M.S., LPCA, [facility] Licensed Professional Counselor Associate  Triage Specialist  [facility]  Therapeutic Triage Services Phone: [phone] Fax: [phone]

## 2015-01-08 NOTE — ED Notes (Signed)
Breakfast tray provided. 

## 2015-01-08 NOTE — ED Notes (Signed)
Provided patient with Cheree DittoGraham crackers and water

## 2015-01-08 NOTE — Progress Notes (Signed)
[facility] Last here in July. Patient reporting SI without a plan or intent as well as HI towards bf however no plan. Patient states she lost her seroquel and has not had a dose in 10 days and feels this is why she has decompensated. Also states [facility]. Patient has abscessed tooth which is in the process of being treated. No other medical issues. Patient oriented to unit. Mwal provided, support and reassurance given. Will medicate patient's pain of a 6/10 once orders are processed. She is denying SI/HI now that she is on unit and when asked about AVH she states, "not yet." Patient safe on milieu. Lawrence MarseillesFriedman, Elisabeth Strom Eakes

## 2015-01-08 NOTE — Tx Team (Signed)
Initial Interdisciplinary Treatment Plan   PATIENT STRESSORS: Medication change or noncompliance Substance abuse   PATIENT STRENGTHS: Ability for insight Active sense of humor Average or above average intelligence Capable of independent living Communication skills Financial means General fund of knowledge Motivation for treatment/growth Physical Health Supportive family/friends   PROBLEM LIST: Problem List/Patient Goals Date to be addressed Date deferred Reason deferred Estimated date of resolution  "I just want to get back on my meds." 01/08/15           "I want to stop feeling suicidal." 01/08/15                                          DISCHARGE CRITERIA:  Improved stabilization in mood, thinking, and/or behavior Need for constant or close observation no longer present Reduction of life-threatening or endangering symptoms to within safe limits  PRELIMINARY DISCHARGE PLAN: Attend aftercare/continuing care group Return to previous living arrangement  PATIENT/FAMIILY INVOLVEMENT: This treatment plan has been presented to and reviewed with the patient, [name], and/or family member.  The patient and family have been given the opportunity to ask questions and make suggestions.  Lawrence MarseillesFriedman, Laurel Harnden Eakes 01/08/2015, 6:38 PM

## 2015-01-08 NOTE — Progress Notes (Signed)
  CARE MANAGEMENT ED NOTE 01/08/2015  Patient:  [name],[name]   Account Number:  [id number]  Date Initiated:  01/08/2015  Documentation initiated by:  Edd ArbourGIBBS,Decie  Subjective/Objective Assessment:   54 yr old medicare [place] pt without her Seroquel for 1.5 weeks SI but [facility]     Subjective/Objective Assessment Detail:   pt informed Cm she has no pcp  dx Adjustment Disorder with Depressed Mood, MDD, SI     Action/Plan:   noted not pcp for f/u and refills of medications Pt given a 6 page list of medicare providers accepting new patient within zip code [phone]   Action/Plan Detail:   Anticipated DC Date:  01/08/2015     Status Recommendation to Physician:   Result of Recommendation:    Other ED Services  Consult Working Plan    DC Planning Services  Other  Outpatient Services - Pt will follow up    Choice offered to / List presented to:            Status of service:  Completed, signed off  ED Comments:   ED Comments Detail:

## 2015-01-08 NOTE — ED Notes (Addendum)
Pt. And belongings searched and wanded by security, pt. In scrubs. Pt. Has 1 yellow jacket, 1 red shirt, 1 white shirt, 1 jean, silver watch, silver necklace, sock, pantyhose,LG cellphone, glassed with pt. In room, 1 sock and 1 glove, pink purse, suitcase, and black and brown bookbag red snickers and black pierced earrings. Pt. Belongings locked up at the nurses station in the yellow zone.

## 2015-01-08 NOTE — ED Notes (Signed)
Kristin with TTS at bedside. She [facility]. Pt is voluntary.

## 2015-01-08 NOTE — ED Notes (Signed)
Psychiatry team at bedside

## 2015-01-08 NOTE — Progress Notes (Signed)
[facility] Group Notes:  (Nursing/MHT/Case Management/Adjunct)  Date:  01/08/2015  Time:  11:38 PM  Type of Therapy:  Group Therapy  Participation Level:  Did Not Attend  Participation Quality:  Did Not Attend  Affect:  Did Not Attend  Cognitive:  Did Not Attend  Insight:  None  Engagement in Group:  Did Not Attend  Modes of Intervention:  Discussion and Education  Summary of Progress/Problems: Pt. Was resting in bed.  Sondra ComeWilson, Murrell Elizondo J 01/08/2015, 11:38 PM
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
## 2015-01-08 NOTE — ED Notes (Signed)
Pt alert, oriented, and amb[facility]. One large blue suitcase, black book bag, and one personal belongings bag containing her purse.

## 2015-01-08 NOTE — ED Notes (Signed)
Lunch tray provided. 

## 2015-01-08 NOTE — Progress Notes (Signed)
Pt has been in her bed since shift change at 1900.  Pt responded promptly when name was called.  Pt reports she is here for depression and suicidal thoughts, but she denies SI/HI/AV at this time.  Her main complaint at this time is her tooth pain and concern whether she will get her Seroquel at bedtime.  Writer informed pt that the Seroquel had not been reordered, but that she had ibuprofen available for pain.  Pt was frustrated that she did not have the Seroquel reordered, but was appropriate in her response to Clinical research associatewriter.  Pt was encouraged to discuss her concerns with the MD in the AM.  Pt was agreeable with the meds she had been ordered for the night, and was also given a heat pack for comfort of her tooth pain.  Pt did not attend evening group.  Support and encouragement offered.  Discharge plans in process.  Safety maintained with q15 minute checks.

## 2015-01-08 NOTE — ED Notes (Addendum)
[facility] called and reports someone will transport the within the hour.

## 2015-01-08 NOTE — ED Notes (Signed)
Pt ambulated to restroom with steady gait.

## 2015-01-09 DIAGNOSIS — F1220 Cannabis dependence, uncomplicated: Secondary | ICD-10-CM

## 2015-01-09 DIAGNOSIS — F3110 Bipolar disorder, current episode manic without psychotic features, unspecified: Secondary | ICD-10-CM

## 2015-01-09 MED ORDER — ADULT MULTIVITAMIN W/MINERALS CH: 1.0000 | ORAL_TABLET | Freq: Every day | ORAL | Status: DC

## 2015-01-09 MED ORDER — ADULT MULTIVITAMIN W/MINERALS CH
1.0000 | ORAL_TABLET | Freq: Every day | ORAL | Status: DC
  Administered 2015-01-09 – 2015-01-14 (×6): 1 via ORAL
  Filled 2015-01-09 (×8): qty 1

## 2015-01-09 MED ORDER — QUETIAPINE FUMARATE 200 MG PO TABS
200.0000 mg | ORAL_TABLET | Freq: Every day | ORAL | Status: DC
  Administered 2015-01-09 – 2015-01-10 (×2): 200 mg via ORAL
  Filled 2015-01-09 (×4): qty 1

## 2015-01-09 MED ORDER — CALCIUM CARBONATE ANTACID 500 MG PO CHEW
CHEWABLE_TABLET | ORAL | Status: AC
  Administered 2015-01-10: 200 mg
  Filled 2015-01-09: qty 1

## 2015-01-09 MED ORDER — ENSURE COMPLETE PO LIQD
237.0000 mL | Freq: Two times a day (BID) | ORAL | Status: DC
  Administered 2015-01-09 – 2015-01-13 (×7): 237 mL via ORAL

## 2015-01-09 MED ORDER — CALCIUM CARBONATE ANTACID 500 MG PO CHEW
CHEWABLE_TABLET | ORAL | Status: AC
  Filled 2015-01-09: qty 1

## 2015-01-09 MED ORDER — QUETIAPINE FUMARATE 50 MG PO TABS
50.0000 mg | ORAL_TABLET | Freq: Every day | ORAL | Status: DC
  Administered 2015-01-09 – 2015-01-14 (×6): 50 mg via ORAL
  Filled 2015-01-09: qty 3
  Filled 2015-01-09 (×2): qty 1
  Filled 2015-01-09: qty 3
  Filled 2015-01-09 (×5): qty 1

## 2015-01-09 NOTE — H&P (Signed)
Psychiatric Admission Assessment Adult  Patient Identification:  [name]  Date of Evaluation:  01/09/2015  Chief Complaint:  BIPOLAR,DEPRESSION  History of Present Illness: This is an admission assessment for [name], a 54 year old African-American female. [name] has been a patient in this hospital x numerous times, all related to mood instability & polysubstance dependence. She reports, "I went to the [facility] ED yesterday because I was very depressed, suicidal a& homicidal the same time. I had lost my medicine 2 weeks ago and had not been on it. As a result, I relapsed on cocaine and weed. I have been using & smoking for about 1 week. Prior to relapsing a week ago, I was sober from drugs x 1 year. I was doing very well until I lost my medicine. I need to get back on my medicine (Seroquel) or I will start hearing voices again. I know that is coming if I don't get back on my medicine".  [place] Eagle[place]a [name] is alert, oriented x 3 & aware of situation. During this assessment, [name] is noted to be hyperactive, jittery, anxious, restless, labile with pressured speech. She says she has not been sleeping well. Denies any hallucinations, admits mood swings. Made a request to be put on nutritional supplement; ensure & multivitamins.   Elements:  Location:  Bipolar 1 disorder. Quality:  Restlessness, high anxiety, labile, insomnia, pressured speech. Severity:  Severe. Timing:  Symptoms current. Duration:  14 days since the onset of symptoms. Context:  "Lost my medicine, relapse on cocaine & THC, got very depressed, became SIHI".  Associated Signs/Synptoms:  Depression Symptoms:  depressed mood, insomnia, psychomotor agitation, difficulty concentrating, anxiety, insomnia,  (Hypo) Manic Symptoms:  Distractibility, Elevated Mood, Labiality of Mood,  Anxiety Symptoms:  Excessive Worry,  Psychotic Symptoms:  Denies  PTSD Symptoms: NA  Total Time spent with patient: 1  hour  Psychiatric Specialty Exam: Physical Exam  Constitutional: She is oriented to person, place, and time. She appears well-developed.  HENT:  Head: Normocephalic.  Eyes: Pupils are equal, round, and reactive to light.  Neck: Normal range of motion.  Cardiovascular: Normal rate.   Respiratory: Effort normal.  GI: Soft.  Genitourinary:  Denies any issues in this areas  Musculoskeletal: Normal range of motion.  Neurological: She is alert and oriented to person, place, and time.  Skin: Skin is warm and dry.  Psychiatric: Thought content normal. Her mood appears anxious. Her affect is labile. Her speech is rapid and/or pressured. She is hyperactive. Cognition and memory are normal. She expresses impulsivity. She exhibits a depressed mood.    Review of Systems  Constitutional: Negative.   HENT: Negative.   Eyes: Negative.   Respiratory: Negative.   Cardiovascular: Negative.   Gastrointestinal: Negative.   Genitourinary: Negative.   Musculoskeletal: Negative.   Skin: Negative.   Neurological: Negative.   Endo/Heme/Allergies: Negative.   Psychiatric/Behavioral: Positive for depression and substance abuse (Hx, Cocain & THC dependence). Negative for suicidal ideas, hallucinations and memory loss. The patient is nervous/anxious and has insomnia.     Blood pressure 133/81, pulse 70, temperature 98.3 F (36.8 C), temperature source Oral, resp. rate 18, height 5' (1.524 m), weight 55.339 kg (122 lb).Body mass index is 23.83 kg/(m^2).  General Appearance: Casual, Fairly Groomed and restless  Eye Contact::  Fair  Speech:  Pressured  Volume:  Increased  Mood:  Anxious, Depressed and Euphoric  Affect:  Labile  Thought Process:  Coherent and Goal Directed  Orientation:  Full (Time, Place, and  Person)  Thought Content:  Rumination and denies any hallucinations  Suicidal Thoughts:  No  Homicidal Thoughts:  No  Memory:  Immediate;   Good Recent;   Good Remote;   Good  Judgement:  Fair   Insight:  Present  Psychomotor Activity:  Increased and Restlessness  Concentration:  Poor  Recall:  [name] of Knowledge:Fair  Language: Fair  Akathisia:  No  Handed:  Right  AIMS (if indicated):     Assets:  Communication Skills Desire for Improvement  Sleep:  Number of Hours: 5.5   Musculoskeletal: Strength & Muscle Tone: within normal limits Gait & Station: normal Patient leans: N/A  Past Psychiatric History: Diagnosis: Bipolar affective disorder  Hospitalizations: [facility] x numerous times  Outpatient Care: [facility]  Substance Abuse Care: None reported  Self-Mutilation: Patient denies  Suicidal Attempts: Patient denies  Violent Behaviors: Patient denies   Past Medical History:   Past Medical History  Diagnosis Date  . Depression   . Bipolar 1 disorder   . Drug abuse    None.  Allergies:  No Known Allergies  PTA Medications: Prescriptions prior to admission  Medication Sig Dispense Refill Last Dose  . Multiple Vitamin (MULTIVITAMIN WITH MINERALS) TABS tablet Take 1 tablet by mouth daily. For nutritional supplement. (Patient not taking: Reported on 01/07/2015)   07/22/2014 at Unknown time   Previous Psychotropic Medications: Medication/Dose  Seroquel 50 mg daily, Seroquel 400 mg Qhs               Substance Abuse History in the last 12 months:  Yes.    Consequences of Substance Abuse: Medical Consequences:  Liver damage, Possible death by overdose Legal Consequences:  Arrests, jail time, Loss of driving privilege. Family Consequences:  Family discord, divorce and or separation.  Social History:  reports that she has been smoking Cigarettes.  She has a 5 pack-year smoking history. She has never used smokeless tobacco. She reports that she drinks about 15.0 oz of alcohol per week. She reports that she uses illicit drugs ("Crack" cocaine and Marijuana). Additional Social History: Current Place of Residence: [place], [place] of Birth: [place], [place]     Family Members: None reported  Marital Status:  Single  Children: 2  Sons: 1  Daughters: 1  Relationships: Single  Education:  [employer] Charity fundraiser Problems/Performance: Completed high school  Religious Beliefs/Practices: NA  History of Abuse (Emotional/Phsycial/Sexual): Denies  Occupational Experiences: Disabled  Military History:  None.  Legal History: Denies  Hobbies/Interests: Denies  Family History:   Family History  Problem Relation Age of Onset  . Anesthesia problems Neg Hx   . Hypotension Neg Hx   . Malignant hyperthermia Neg Hx   . Pseudochol deficiency Neg Hx     Results for orders placed or performed during the hospital encounter of 01/07/15 (from the past 72 hour(s))  CBC with Differential     Status: Abnormal   Collection Time: 01/07/15  8:25 PM  Result Value Ref Range   WBC 5.9 4.0 - 10.5 K/uL   RBC 4.92 3.87 - 5.11 MIL/uL   Hemoglobin 14.4 12.0 - 15.0 g/dL   HCT 45.7 36.0 - 46.0 %   MCV 92.9 78.0 - 100.0 fL   MCH 29.3 26.0 - 34.0 pg   MCHC 31.5 30.0 - 36.0 g/dL   RDW 15.2 11.5 - 15.5 %   Platelets 276 150 - 400 K/uL   Neutrophils Relative % 41 (L) 43 - 77 %  Neutro Abs 2.4 1.7 - 7.7 K/uL   Lymphocytes Relative 43 12 - 46 %   Lymphs Abs 2.6 0.7 - 4.0 K/uL   Monocytes Relative 10 3 - 12 %   Monocytes Absolute 0.6 0.1 - 1.0 K/uL   Eosinophils Relative 5 0 - 5 %   Eosinophils Absolute 0.3 0.0 - 0.7 K/uL   Basophils Relative 1 0 - 1 %   Basophils Absolute 0.0 0.0 - 0.1 K/uL  Comprehensive metabolic panel     Status: Abnormal   Collection Time: 01/07/15  8:25 PM  Result Value Ref Range   Sodium 141 135 - 145 mmol/L    Comment: Please note change in reference range.   Potassium 3.9 3.5 - 5.1 mmol/L    Comment: Please note change in reference range.   Chloride 109 96 - 112 mEq/L   CO2 29 19 - 32 mmol/L   Glucose, Bld 70 70 - 99 mg/dL   BUN 18 6 - 23 mg/dL   Creatinine, Ser 0.95 0.50 - 1.10 mg/dL   Calcium 8.2 (L) 8.4 - 10.5 mg/dL    Total Protein 5.3 (L) 6.0 - 8.3 g/dL   Albumin 2.9 (L) 3.5 - 5.2 g/dL   AST 22 0 - 37 U/L   ALT 17 0 - 35 U/L   Alkaline Phosphatase 45 39 - 117 U/L   Total Bilirubin 0.2 (L) 0.3 - 1.2 mg/dL   GFR calc non Af Amer 68 (L) >90 mL/min   GFR calc Af Amer 79 (L) >90 mL/min    Comment: (NOTE) The eGFR has been calculated using the CKD EPI equation. This calculation has not been validated in all clinical situations. eGFR's persistently <90 mL/min signify possible Chronic Kidney Disease.    Anion gap 3 (L) 5 - 15  Ethanol     Status: None   Collection Time: 01/07/15  8:25 PM  Result Value Ref Range   Alcohol, Ethyl (B) <5 0 - 9 mg/dL    Comment:        LOWEST DETECTABLE LIMIT FOR SERUM ALCOHOL IS 11 mg/dL FOR MEDICAL PURPOSES ONLY   Acetaminophen level     Status: Abnormal   Collection Time: 01/07/15  8:25 PM  Result Value Ref Range   Acetaminophen (Tylenol), Serum <10.0 (L) 10 - 30 ug/mL    Comment:        THERAPEUTIC CONCENTRATIONS VARY SIGNIFICANTLY. A RANGE OF 10-30 ug/mL MAY BE AN EFFECTIVE CONCENTRATION FOR MANY PATIENTS. HOWEVER, SOME ARE BEST TREATED AT CONCENTRATIONS OUTSIDE THIS RANGE. ACETAMINOPHEN CONCENTRATIONS >150 ug/mL AT 4 HOURS AFTER INGESTION AND >50 ug/mL AT 12 HOURS AFTER INGESTION ARE OFTEN ASSOCIATED WITH TOXIC REACTIONS.   Salicylate level     Status: None   Collection Time: 01/07/15  8:25 PM  Result Value Ref Range   Salicylate Lvl <6.6 2.8 - 20.0 mg/dL  Urine rapid drug screen (hosp performed)     Status: Abnormal   Collection Time: 01/07/15 10:48 PM  Result Value Ref Range   Opiates NONE DETECTED NONE DETECTED   Cocaine POSITIVE (A) NONE DETECTED   Benzodiazepines NONE DETECTED NONE DETECTED   Amphetamines NONE DETECTED NONE DETECTED   Tetrahydrocannabinol POSITIVE (A) NONE DETECTED   Barbiturates NONE DETECTED NONE DETECTED    Comment:        DRUG SCREEN FOR MEDICAL PURPOSES ONLY.  IF CONFIRMATION IS NEEDED FOR ANY PURPOSE, NOTIFY  LAB WITHIN 5 DAYS.        LOWEST DETECTABLE LIMITS FOR  URINE DRUG SCREEN Drug Class       Cutoff (ng/mL) Amphetamine      1000 Barbiturate      200 Benzodiazepine   025 Tricyclics       427 Opiates          300 Cocaine          300 THC              50    Psychological Evaluations:  Assessment:   DSM5: Schizophrenia Disorders:  NA Obsessive-Compulsive Disorders:  NA Trauma-Stressor Disorders:  NA Substance/Addictive Disorders:  Cannabis Use Disorder - Severe (304.30), Cocaine dependence Depressive Disorders:  Bipolar 1 disorder, manic  AXIS III:   Past Medical History  Diagnosis Date  . Depression   . Bipolar 1 disorder   . Drug abuse    Treatment Plan/Recommendations: 1. Admit for crisis management and stabilization, estimated length of stay 3-5 days.  2. Medication management to reduce current symptoms to base line and improve the patient's overall level of functioning, Initiate Seroqel 50 mg daily & 200 mg Q bedtime for mood control, discontinue Trazodone-not compatible with seroquel 3. Treat health problems as indicated.  4. Develop treatment plan to decrease risk of relapse upon discharge and the need for readmission.  5. Psycho-social education regarding relapse prevention and self care.  6. Health care follow up as needed for medical problems.  7. Review, reconcile, and reinstate any pertinent home medications for other health issues where appropriate. 8. Call for consults with hospitalist for any additional specialty patient care services as needed.  Treatment Plan Summary: Daily contact with patient to assess and evaluate symptoms and progress in treatment Medication management  Current Medications:  Current Facility-Administered Medications  Medication Dose Route Frequency Provider Last Rate Last Dose  . acetaminophen (TYLENOL) tablet 650 mg  650 mg Oral Q6H PRN Elmarie Shiley, NP      . alum & mag hydroxide-simeth (MAALOX/MYLANTA) 200-200-20 MG/5ML suspension  30 mL  30 mL Oral Q4H PRN Benjamine Mola, FNP      . benzocaine (ORAJEL) 10 % mucosal gel   Mouth/Throat QID PRN Elmarie Shiley, NP      . hydrOXYzine (ATARAX/VISTARIL) tablet 25 mg  25 mg Oral Q6H PRN Benjamine Mola, FNP      . ibuprofen (ADVIL,MOTRIN) tablet 600 mg  600 mg Oral Q6H PRN Elmarie Shiley, NP   600 mg at 01/08/15 2250  . magnesium hydroxide (MILK OF MAGNESIA) suspension 30 mL  30 mL Oral Daily PRN Benjamine Mola, FNP      . penicillin v potassium (VEETID) tablet 500 mg  500 mg Oral 4 times per day Benjamine Mola, FNP   500 mg at 01/09/15 0659  . traZODone (DESYREL) tablet 50 mg  50 mg Oral QHS PRN,MR X 1 Evanna Glenda Chroman, NP        Observation Level/Precautions:  15 minute checks  Laboratory:  Per ED  Psychotherapy:  Group sessions  Medications: See medication lists   Consultations: As needed    Discharge Concerns:  Mood stability, maintaining sobriety  Estimated LOS: 5-7 days  Other:     I certify that inpatient services furnished can reasonably be expected to improve the patient's condition.   Lindell Spar I, PMHNP-BC 1/16/20169:15 AM  Patient seen face to face for this evaluation, case discussed with physician extender and formulated treatment plan. Reviewed the information documented and agree with the treatment plan.  Raychelle Hudman,JANARDHAHA R. 01/10/2015 3:54 PM

## 2015-01-09 NOTE — Progress Notes (Signed)
Patient did not attend the evening speaker AA meeting.  

## 2015-01-09 NOTE — [facility] Group Notes (Signed)
[facility] LCSW Group Therapy 01/09/2015  11 AM   Type of Therapy: Group Therapy  Participation Level: Did Not Attend. Patient invited to participate but declined.   Samuella BruinKristin Teagan Heidrick, MSW, [employer]CSWA Clinical Social Worker [facility] [phone]

## 2015-01-09 NOTE — Clinical Social Work Note (Signed)
CSW has attempted twice today complete PSA with patient:  9 am- patient on telephone and unavailable to complete assessment  2:40 pm- patient in bed asleep. CSW woke patient up and introduced self, patient reports that she does not want to speak at this time.  CSW will attempt to complete PSA at a later time.  Samuella BruinKristin Arisa Congleton, MSW, [employer]CSWA Clinical Social Worker [facility] [phone]

## 2015-01-09 NOTE — Progress Notes (Signed)
[name] has had a good day today....she received her seroquel and immediately went to sleep  And has slept off and on through out  The day. When she wakes up she says things like " I'm starting to slowwwwwwwwdown. My body is catching up with my brain.....finallly.   A She takes all of her meds as scheduled. She completes her morning assessment and onit she writes she is positive for SI within the past 24 hrs, but she contracts easily with this nurse for safety.   R  Safety is in place and poc cont.

## 2015-01-10 DIAGNOSIS — F3110 Bipolar disorder, current episode manic without psychotic features, unspecified: Secondary | ICD-10-CM

## 2015-01-10 NOTE — [facility] Counselor (Signed)
Adult Comprehensive Assessment  Patient ID: [name], female   DOB: Sep 03, 1961, 54 y.o.   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  Pt reports this is a good environment.  How long has patient lived in current situation?: 2.5 years What is atmosphere in current home: Supportive, Loving, Comfortable  Family History:  Marital status: Single Does patient have children?: Yes How many children?: 2 How is patient's relationship with their children?: Pt reports decent relationship with children.    Childhood History:  By whom was/is the patient raised?: Both parents Additional childhood history information: Pt describes her childhood as excellent.  Description of patient's relationship with caregiver when they were a child: Pt reports getting along well with parents growing up.   Patient's description of current relationship with people who raised him/her: Pt reports being close to mother today, father is deceased Does patient have siblings?: Yes Number of Siblings: 2 Description of patient's current relationship with siblings: Pt reports being close to sisters Did patient suffer any verbal/emotional/physical/sexual abuse as a child?: No Did patient suffer from severe childhood neglect?: No Has patient ever been sexually abused/assaulted/raped as an adolescent or adult?: No Was the patient ever a victim of a crime  or a disaster?: No Witnessed domestic violence?: No Has patient been effected by domestic violence as an adult?: No  Education:  Highest grade of school patient has completed: graduated high school Currently a Consulting civil engineer?: No Learning disability?: No  Employment/Work Situation:   Employment situation: On disability Why is patient on disability: Bipolar Disorder, Schizophrenia How long has patient been on disability: 16 years Patient's job has been impacted by current illness: No What is the longest time patient has a held a job?: 7 years Where was the patient employed at that time?: [employer] [place], [place] Has patient ever been in the [employer]?: No Has patient ever served in Buyer, retail?: No  Financial Resources:   Surveyor, quantity resources: Insurance claims handler, Medicare Does patient have a Lawyer or guardian?: No  Alcohol/Substance Abuse:   What has been your use of drugs/alcohol within the last 12 months?: marijuana - 1/2 joint and cocaine - $50 worth one time use 1 week ago, reports not using 7 months prior to that If attempted suicide, did drugs/alcohol play a role in this?: No Alcohol/Substance Abuse Treatment Hx: Past Tx, Outpatient, Past Tx, Inpatient, Attends AA/NA, Past detox If yes, describe treatment: [facility], [facility], [facility] Has alcohol/substance abuse ever caused legal problems?: No  Social Support System:   Forensic psychologist System: Poor Describe Community Support System: Pt denies having a support system Type of faith/religion: Denies How does patient's faith help to cope with current illness?: N/A  Leisure/Recreation:   Leisure and Hobbies: reading  Strengths/Needs:   What things does the patient do well?: dealing with people, children, friendly In what areas does patient struggle / problems for patient: mood instability, SI  Discharge Plan:   Does patient have access to transportation?: Yes (will need a bus pass)  Will patient be  returning to same living situation after discharge?: Yes Currently receiving community mental health services: Yes (From Whom) [facility]) If no, would patient like referral for services when discharged?: Yes (What county?) [facility] Does patient have financial barriers related to discharge medications?: No  Summary/Recommendations:     Patient is a 54 year old African American female with a diagnosis of Bipolar Disorder.  [place].  Pt reports losing her meds a week and a half ago and becoming suicidal, so she decided to come in before it got worse.  Pt also reports relapsing on cocaine and marijuana last week after being clean for 7 months.  Patient will benefit from crisis stabilization, medication evaluation, group therapy and psycho education in addition to case management for discharge planning. Discharge Process and Patient Expectations information sheet signed by patient, witnessed by writer and inserted in patient's shadow chart.    [name], Stephanie Arnthelsea Nicole. 01/10/2015

## 2015-01-10 NOTE — Progress Notes (Addendum)
[name] is seen OOB UAL on the 400 hall today, tolerated well.  SHe is talkative, perky and can get loud and intrusive when she doesn't get her way.    A She completes her self assessment and on it she writes She Does have SI within the past 24 hrs but she rates her feelings of depression, hopelessness and anxiety  "7/7/7", respectively. She  Says her seroquel is " really " helping her to sleep and " LOOK.Marland Kitchen.i'm sooooo much better this morning!!!!   R Safety in place and  poc in place

## 2015-01-10 NOTE — [facility] Group Notes (Signed)
[facility] LCSW Group Therapy  01/10/2015 1:15 PM   Type of Therapy:  Group Therapy  Participation Level:  Did Not Attend  Leilana Mcquire [name], LCSW 01/10/2015 2:26 PM    

## 2015-01-10 NOTE — Progress Notes (Signed)
Stephanie Selena BattenKim is status quo. She takes her meds. She completes her self inventory and on it she rates her depression, hopelessness and anxiety "7/7/7"  And she says she is positive for SI. WHen this nurse questions her about this she said " I'm not going to hurt myself".   A She refused to go to group. She takes her ensures and attends her meals in the cafe'.   R Safety in place.

## 2015-01-10 NOTE — [facility] Group Notes (Signed)
[facility] Group Notes:  (Nursing/MHT/Case Management/Adjunct)  Date:  01/10/2015  Time:  3:11 PM  Type of Therapy:  Psychoeducational Skills  Participation Level:  Active  Participation Quality:  Appropriate  Affect:  Appropriate  Cognitive:  Appropriate  Insight:  Appropriate  Engagement in Group:  Engaged  Modes of Intervention:  Discussion  Summary of Progress/Problems: Pt did attend self inventory group, pt reported that she was negative HI, no AH/VH noted. Pt reported that she continues to have SI, however contracts for safety. Pt rated her depression as a 7, and her helplessness/hopelessness as a 10.     Pt reported no issues or concerns.   Jacquelyne BalintForrest, Lonzie Simmer Shanta 01/10/2015, 3:11 PM

## 2015-01-10 NOTE — Progress Notes (Signed)
Patient was seen sleeping. Woke up @ 9 PM during night time medication. Patient said that she's been sleeping a lot now because she is back to taking Seroquel which she's stopped for days now. Patient also said that it's going to take like 2 days for my body to catch up and settle with my brain. Patient also reports hot flashes as a result of "going through menopause" patient requested for a pitcher of iced water to help with the hot flashes. She denied pain, SI,AH/VH. Due medications given as ordered. Will continue to monitor for safety and stability.

## 2015-01-10 NOTE — Progress Notes (Signed)
[facility] MD Progress Note  01/10/2015 6:23 PM Mario Voong  MRN:  [id number]  Subjective:  [name] reports, "I'm doing a lot better".  O: Patient seen, chart reviewed. Roxanna is visible on the unit. She is participating in group sessions. She is currently endorsing improved mood and sleep. Denies any new issues.   Diagnosis:   DSM5: Schizophrenia Disorders: NA Obsessive-Compulsive Disorders:  NA Trauma-Stressor Disorders:  NA Substance/Addictive Disorders:  Cocaine dependence Depressive Disorders:  Bipolar I disorder, most recent episode (or current) manic  Total Time spent with patient: 30 minutes  ADL's:  Intact  Sleep: Good  Appetite:  Good  Suicidal Ideation:  Plan:  Denies Intent:  Denies Means:  Denies  Homicidal Ideation:  Plan:  Denies Intent:  Denies Means:  Denies  AEB (as evidenced by):  Psychiatric Specialty Exam: Physical Exam  Review of Systems  Constitutional: Negative.   HENT: Negative.   Eyes: Negative.   Respiratory: Negative.   Cardiovascular: Negative.   Gastrointestinal: Negative.   Genitourinary: Negative.   Musculoskeletal: Negative.   Skin: Negative.   Neurological: Negative.   Endo/Heme/Allergies: Negative.   Psychiatric/Behavioral: Positive for depression (Improving) and substance abuse (Hx of). Negative for suicidal ideas, hallucinations and memory loss. The patient has insomnia (improving). The patient is not nervous/anxious.     Blood pressure 133/82, pulse 67, temperature 97.8 F (36.6 C), temperature source Oral, resp. rate 16, height 5' (1.524 m), weight 55.339 kg (122 lb).Body mass index is 23.83 kg/(m^2).  General Appearance: Casual and Neat  Eye Contact::  Good  Speech:  Clear and Coherent  Volume:  Normal  Mood:  Euthymic  Affect:  Appropriate  Thought Process:  Coherent and Goal Directed  Orientation:  Full (Time, Place, and Person)  Thought Content:  Denies any hallucinations, delusions or paranoia  Suicidal  Thoughts:  No  Homicidal Thoughts:  No  Memory:  Immediate;   Good Recent;   Good Remote;   Good  Judgement:  Intact  Insight:  Present  Psychomotor Activity:  Normal  Concentration:  Good  Recall:  Good  Fund of Knowledge:Fair  Language: Good  Akathisia:  No  Handed:  Right  AIMS (if indicated):     Assets:  Communication Skills Desire for Improvement  Sleep:  Number of Hours: 6.25   Musculoskeletal: Strength & Muscle Tone: within normal limits Gait & Station: normal Patient leans: N/A  Current Medications: Current Facility-Administered Medications  Medication Dose Route Frequency Provider Last Rate Last Dose  . acetaminophen (TYLENOL) tablet 650 mg  650 mg Oral Q6H PRN Fransisca Kaufmann, NP   650 mg at 01/09/15 1653  . alum & mag hydroxide-simeth (MAALOX/MYLANTA) 200-200-20 MG/5ML suspension 30 mL  30 mL Oral Q4H PRN Beau Fanny, FNP      . benzocaine (ORAJEL) 10 % mucosal gel   Mouth/Throat QID PRN Fransisca Kaufmann, NP      . feeding supplement (ENSURE COMPLETE) (ENSURE COMPLETE) liquid 237 mL  237 mL Oral BID BM Sanjuana Kava, NP   237 mL at 01/10/15 1000  . hydrOXYzine (ATARAX/VISTARIL) tablet 25 mg  25 mg Oral Q6H PRN Beau Fanny, FNP      . ibuprofen (ADVIL,MOTRIN) tablet 600 mg  600 mg Oral Q6H PRN Fransisca Kaufmann, NP   600 mg at 01/08/15 2250  . magnesium hydroxide (MILK OF MAGNESIA) suspension 30 mL  30 mL Oral Daily PRN Beau Fanny, FNP      . multivitamin with minerals tablet 1 tablet  1 tablet Oral Daily Sanjuana KavaAgnes I Nwoko, NP   1 tablet at 01/10/15 [phone]  . penicillin v potassium (VEETID) tablet 500 mg  500 mg Oral 4 times per day Beau FannyJohn C Withrow, FNP   500 mg at 01/10/15 1809  . QUEtiapine (SEROQUEL) tablet 200 mg  200 mg Oral QHS Sanjuana KavaAgnes I Nwoko, NP   200 mg at 01/09/15 2122  . QUEtiapine (SEROQUEL) tablet 50 mg  50 mg Oral Daily Sanjuana KavaAgnes I Nwoko, NP   50 mg at 01/10/15 [id number]    Lab Results: No results found for this or any previous visit (from the past 48 hour(s)).  Physical  Findings: AIMS: Facial and Oral Movements Muscles of Facial Expression: None, normal Lips and Perioral Area: None, normal Jaw: None, normal Tongue: None, normal,Extremity Movements Lower (legs, knees, ankles, toes): None, normal, Trunk Movements Neck, shoulders, hips: None, normal, Overall Severity Severity of abnormal movements (highest score from questions above): None, normal Incapacitation due to abnormal movements: None, normal Patient's awareness of abnormal movements (rate only patient's report): No Awareness, Dental Status Current problems with teeth and/or dentures?: Yes Does patient usually wear dentures?: No  CIWA:  CIWA-Ar Total: 4 COWS:     Treatment Plan Summary: Daily contact with patient to assess and evaluate symptoms and progress in treatment Medication management  Plan: Treatment Plan Summary: Daily contact with patient to assess and evaluate symptoms and progress in treatment Medication management  Plan: Supportive approach/coping skills/relapse prevention           Bipolar I disorder: will Continue Seroquel 200 mg Q hs & Seroquel 50 mg for anxiety, and continue to work with mindfulness, CBT help  identify the cognitive distortions that keep the mood instability going          Discuss other life style changes that can help with both her mood instability and drug use such as exercise, meditation           Drug Dependence: will continue use of motivational interviewing and encourage to pursue total abstinence                    Continue to educate in terms of AA/NA and other recovery strategies   Medical Decision Making Problem Points:  Established problem, stable/improving (1) and Review of psycho-social stressors (1) Data Points:  Review of medication regiment & side effects (2) Review of new medications or change in dosage (2)  I certify that inpatient services furnished can reasonably be expected to improve the patient's condition.   Sanjuana Kavawoko, Agnes I,  PMHNP-BC 01/10/2015, 6:23 PM  Reviewed the information documented and agree with the treatment plan.  Alson Mcpheeters,JANARDHAHA R. 01/12/2015 2:07 PM

## 2015-01-11 DIAGNOSIS — F1410 Cocaine abuse, uncomplicated: Secondary | ICD-10-CM

## 2015-01-11 MED ORDER — QUETIAPINE FUMARATE 300 MG PO TABS
300.0000 mg | ORAL_TABLET | Freq: Every day | ORAL | Status: DC
  Administered 2015-01-11 – 2015-01-13 (×3): 300 mg via ORAL
  Filled 2015-01-11: qty 3
  Filled 2015-01-11 (×3): qty 1
  Filled 2015-01-11: qty 3
  Filled 2015-01-11 (×2): qty 1

## 2015-01-11 NOTE — [facility] Group Notes (Signed)
[facility]   01/11/2015 11:19 AM    Participation Quality:  Appropraite  Mood/Affect:  Appropriate  Depression Rating:  7  Anxiety Rating:  10  Thoughts of Suicide:  Yes, patient endorsing off/on SI   Will you contract for safety?  Yes  Current AVH:  No  Plan for Discharge/Comments:  Patient attended discharge planning group and actively participated in group. She reports she has home and outpatient with [facility].  Suicide prevention education reviewed and SPE document provided.   Transportation Means: Patient uses public transportation.   Supports:  Patient has a limited support system.   Felecia Stanfill, Joesph JulyQuylle Hairston

## 2015-01-11 NOTE — Plan of Care (Signed)
Problem: Diagnosis: Increased Risk For Suicide Attempt Goal: STG-Patient Will Report Suicidal Feelings to Staff Outcome: Progressing Pt still having some passive suicidal ideations she denies any plan here in the hospital.  She does contract verbally to come to staff before acting on any self-harm thoughts.

## 2015-01-11 NOTE — Progress Notes (Signed)
Patient ID: [name], female   DOB: [birth year], 54 y.o.   MRN: [id number] [facility] MD Progress Note  01/11/2015 6:34 PM [name]  MRN:  [id number]  Subjective: patient reports feeling better. Tolerating medications well , denies side effects at this time.  Objective :  I have discussed case with treatment team and met with patient. Report is that patient is improved compared to admission. Patient does acknowledge improvement but states she does remain anxious and depressed and does not feel at baseline. Has denied any suicidal ideations at this time and is able to contract for safety on unit at present. She has been going to groups and is visible on unit, participative in milieu. No medication side effects. Patient is insightful and states she had stopped taking medications, then  had relapsed on cocaine , and this had resulted in increased depression and emergence of SI. States " I know I have to take my meds- as long as I do I am OK".   Diagnosis:   DSM5: Schizophrenia Disorders: NA Obsessive-Compulsive Disorders:  NA Trauma-Stressor Disorders:  NA Substance/Addictive Disorders:  Cocaine dependence Depressive Disorders:  Bipolar I disorder, most recent episode (or current) manic  Total Time spent with patient: 25 minutes  ADL's:  Intact  Sleep: Good  Appetite:  Good  Suicidal Ideation:  Currently denies   Homicidal Ideation:  Currently denies   AEB (as evidenced by):  Psychiatric Specialty Exam: Physical Exam  ROS  Blood pressure 116/69, pulse 75, temperature 98.1 F (36.7 C), temperature source Oral, resp. rate 20, height 5' (1.524 m), weight 122 lb (55.339 kg).Body mass index is 23.83 kg/(m^2).  General Appearance: Well Groomed  Engineer, water::  Good  Speech:  Clear and Coherent  Volume:  Normal  Mood:  improved, today seems euthymic  Affect:  Appropriate  Thought Process:  Coherent and Goal Directed  Orientation:  Full (Time, Place, and Person)  Thought  Content:  no hallucinations, no delusions, not internally preoccupied at present  Suicidal Thoughts:  No - currently denies any suicidal ideations  Homicidal Thoughts:  No  Memory:  Recent and remote grossly intact   Judgement:  Intact  Insight:  Present  Psychomotor Activity:  Normal  Concentration:  Good  Recall:  Good  Fund of Knowledge:Fair  Language: Good  Akathisia:  No  Handed:  Right  AIMS (if indicated):     Assets:  Communication Skills Desire for Improvement  Sleep:  Number of Hours: 6.25   Musculoskeletal: Strength & Muscle Tone: within normal limits Gait & Station: normal Patient leans: N/A  Current Medications: Current Facility-Administered Medications  Medication Dose Route Frequency Provider Last Rate Last Dose  . acetaminophen (TYLENOL) tablet 650 mg  650 mg Oral Q6H PRN Elmarie Shiley, NP   650 mg at 01/09/15 1653  . alum & mag hydroxide-simeth (MAALOX/MYLANTA) 200-200-20 MG/5ML suspension 30 mL  30 mL Oral Q4H PRN Benjamine Mola, FNP      . benzocaine (ORAJEL) 10 % mucosal gel   Mouth/Throat QID PRN Elmarie Shiley, NP      . feeding supplement (ENSURE COMPLETE) (ENSURE COMPLETE) liquid 237 mL  237 mL Oral BID BM Encarnacion Slates, NP   237 mL at 01/11/15 0808  . hydrOXYzine (ATARAX/VISTARIL) tablet 25 mg  25 mg Oral Q6H PRN Benjamine Mola, FNP      . ibuprofen (ADVIL,MOTRIN) tablet 600 mg  600 mg Oral Q6H PRN Elmarie Shiley, NP   600 mg at 01/11/15 9357  .  magnesium hydroxide (MILK OF MAGNESIA) suspension 30 mL  30 mL Oral Daily PRN Benjamine Mola, FNP      . multivitamin with minerals tablet 1 tablet  1 tablet Oral Daily Encarnacion Slates, NP   1 tablet at 01/11/15 0809  . penicillin v potassium (VEETID) tablet 500 mg  500 mg Oral 4 times per day Benjamine Mola, FNP   500 mg at 01/11/15 1800  . QUEtiapine (SEROQUEL) tablet 200 mg  200 mg Oral QHS Encarnacion Slates, NP   200 mg at 01/10/15 2125  . QUEtiapine (SEROQUEL) tablet 50 mg  50 mg Oral Daily Encarnacion Slates, NP   50 mg at  01/11/15 7858    Lab Results: No results found for this or any previous visit (from the past 48 hour(s)).  Physical Findings: AIMS: Facial and Oral Movements Muscles of Facial Expression: None, normal Lips and Perioral Area: None, normal Jaw: None, normal Tongue: None, normal,Extremity Movements Lower (legs, knees, ankles, toes): None, normal, Trunk Movements Neck, shoulders, hips: None, normal, Overall Severity Severity of abnormal movements (highest score from questions above): None, normal Incapacitation due to abnormal movements: None, normal Patient's awareness of abnormal movements (rate only patient's report): No Awareness, Dental Status Current problems with teeth and/or dentures?: Yes Does patient usually wear dentures?: No  CIWA:  CIWA-Ar Total: 4 COWS:      Assessment- patient improved compared to admission. At present mood presents euthymic, denies any SI, and behavior in good control. Of note, patient states she does better on higher doses of Seroquel, and denies having any side effects from this medication, and is requesting increase in dose .   Treatment Plan Summary: Daily contact with patient to assess and evaluate symptoms and progress in treatment Medication management  Plan: Treatment Plan Summary: Daily contact with patient to assess and evaluate symptoms and progress in treatment Medication management  Plan:  Continue inpatient treatment. Increase Seroquel to  50 mgrs QAM and 300 mgrs QHS Will follow    Medical Decision Making Problem Points:  Established problem, stable/improving (1) and Review of psycho-social stressors (1) Data Points:  Review of medication regiment & side effects (2) Review of new medications or change in dosage (2)  I certify that inpatient services furnished can reasonably be expected to improve the patient's condition.   Neita Garnet,  01/11/2015, 6:34 PM

## 2015-01-11 NOTE — Progress Notes (Signed)
Patient ID: [name], female   DOB: 02-Sep-1961, 54 y.o.   MRN: [id number] PER STATE REGULATIONS 482.30  THIS CHART WAS REVIEWED FOR MEDICAL NECESSITY WITH RESPECT TO THE PATIENT'S ADMISSION/DURATION OF STAY.  NEXT REVIEW DATE: 01/12/15  Loura HaltBARBARA Reannon Candella, RN, BSN CASE MANAGER

## 2015-01-11 NOTE — Progress Notes (Signed)
Patient did not attend the evening speaker AA meeting. Pt was notified that group was beginning but remained in bed.   

## 2015-01-11 NOTE — Progress Notes (Signed)
Adult Psychoeducational Group Note  Date:  01/11/2015 Time:  10:00 AM  Group Topic/Focus:  Wellness Toolbox:   The focus of this group is to discuss various aspects of wellness, balancing those aspects and exploring ways to increase the ability to experience wellness.  Patients will create a wellness toolbox for use upon discharge.  Participation Level:  Active  Participation Quality:  Appropriate  Affect:  Anxious  Cognitive:  Alert  Insight: Good  Engagement in Group:  Engaged  Modes of Intervention:  Activity and Discussion  Additional Comments:  Pt. Attended group and actively participated.  Elmore GuiseSLOAN, Advika Mclelland N 01/11/2015, 1:12 PM

## 2015-01-11 NOTE — Progress Notes (Signed)
Patient pleasant. Seen socializing with peers and staff. Denies pain, SI, AH/VH but continues to endorse hot flashes as  A result of menopause. Patient remains compliant with medication and treatment plan. Due medications given as ordered. Safety in place. Will continue to monitor patient.

## 2015-01-11 NOTE — [facility] Group Notes (Signed)
[facility] LCSW Group Therapy          Overcoming Obstacles       1:15 -2:30        01/11/2015   3:01 PM     Type of Therapy:  Group Therapy  Participation Level:  Appropriate  Participation Quality:  Appropriate  Affect:  Depressed  Cognitive:  Attentive Appropriate  Insight: Developing/Improving Engaged  Engagement in Therapy: Developing/Imprvoing Engaged  Modes of Intervention:  Discussion Exploration  Education Rapport BuildingProblem-Solving Support  Summary of Progress/Problems:  The main focus of today's group was overcoming obstacles. Patient shared the obstacle she has to overcome is non-compliance with medications.  She shared she will do well for while and then begin to think she does not need them.  She shared not taking meds leads to her relapsing on cocaine.[name].   [name] 01/11/2015   3:01 PM

## 2015-01-11 NOTE — Progress Notes (Signed)
Pt has been up and active in the milieu today. She rated her depression 7 hopelessness and anxiety a 10 on her self-inventory. She does admit to some passive S/I with no plans here in the hospital and does verbally contract to come to staff. She denies any H/I or A/V/H. She did request ibuprofen at [phone] for mouth pain rated 7 after f/u she rated it 4 on scale 0-10.  [facility]

## 2015-01-12 NOTE — Plan of Care (Signed)
Problem: Ineffective individual coping Goal: STG: Patient will remain free from self harm Outcome: Progressing Pt has not harmed self since hospitalization

## 2015-01-12 NOTE — Progress Notes (Signed)
D:  Patient's self inventory sheet, patient has fair sleep, no sleep medication.  Fair appetite, high energy level, poor concentration.  Rated depression and anxiety 6, hopeless 10.  Has felt agitation and irritability.  SI at times, contracts for safety.  Has felt lightheaded, pain, toothache in past 24 hours.  Worst pain #8.  No questions for staff. A:  Medications administered per MD orders.  Emotional support and encouragement given patient. R:  Patient denied SI and HI, contracts for safety this morning.  Denied A/V hallucinations also while talking to nurse.  On self inventory sheet, patient stated she was SI.   Patient did not come to group this morning.  Has stayed in her bed

## 2015-01-12 NOTE — Progress Notes (Signed)
Patient ID: [name], female   DOB: March 25, 1961, 54 y.o.   MRN: [id number] PER STATE REGULATIONS 482.30  THIS CHART WAS REVIEWED FOR MEDICAL NECESSITY WITH RESPECT TO THE PATIENT'S ADMISSION/ DURATION OF STAY.  NEXT REVIEW DATE: 01/16/2015    Willa RoughJENNIFER JONES Stephanie Holst, RN, BSN CASE MANAGER'

## 2015-01-12 NOTE — [facility] Group Notes (Signed)
The focus of this group is to educate the patient on the purpose and policies of crisis stabilization and provide a format to answer questions about their admission.  The group details unit policies and expectations of patients while admitted.  Patient did not attend 0900 nurse education orientation group this morning.  Patient stayed in bed.   

## 2015-01-12 NOTE — [facility] Group Notes (Signed)
[facility] LCSW Group Therapy      Feelings About Diagnosis 1:15 - 2:30 PM         01/12/2015    Type of Therapy:  Group Therapy  Participation Level:  Active  Participation Quality:  Appropriate  Affect:  Appropriate  Cognitive:  Alert and Appropriate  Insight:  Developing/Improving and Engaged  Engagement in Therapy:  Developing/Improving and Engaged  Modes of Intervention:  Discussion, Education, Exploration, Problem-Solving, Rapport Building, Support  Summary of Progress/Problems:  Patient actively participated in group. Patient discussed past and present diagnosis and the effects it has had on  life.  Patient shared it has been hard accepting the diagnosis because of having an aunt who was mentally ill and seeing the problems she had.  Patient stated as she gets older she knows she needs to stays on meds and that she can have a decent life.  Wynn BankerHodnett, [name] 01/12/2015

## 2015-01-12 NOTE — Progress Notes (Signed)
Patient ID: [name], female   DOB: 1961/12/19, 54 y.o.   MRN: [id number] [facility] MD Progress Note  01/12/2015 5:55 PM [name]  MRN:  [id number]  Subjective:  Patient states she is doing "OK", and states that earlier today she was " kind of upset" but later received " good news- I think I am going to be able to get a place to stay in [place]". Patient states that after this she has felt better.   Objective :  I have discussed case with treatment team and met with patient. At present patient presents calm, pleasant , with reactive affect. She is stating she is wanting to relocate to [place], as living in [place] has been difficult for her. States it may be easier for her to consolidate sobriety in a new environment. Behavior on unit in good control. Some group participation. Staff reports that patient's motivation in milieu fluctuates and is sometimes low. She denies any current cravings for cocaine. She is tolerating medications well and at present denies medication side effects. She has tolerated Seroquel dose titration well and denies side effects or excessive sedation.   Diagnosis:   DSM5: Schizophrenia Disorders: NA Obsessive-Compulsive Disorders:  NA Trauma-Stressor Disorders:  NA Substance/Addictive Disorders:  Cocaine dependence Depressive Disorders:  Bipolar I disorder, most recent episode (or current) manic  Total Time spent with patient: 20 minutes  ADL's: improved   Sleep: Good  Appetite:  Good  Suicidal Ideation:  Currently denies   Homicidal Ideation:  Currently denies   AEB (as evidenced by):  Psychiatric Specialty Exam: Physical Exam  ROS  Blood pressure 133/73, pulse 69, temperature 98 F (36.7 C), temperature source Oral, resp. rate 18, height 5' (1.524 m), weight 122 lb (55.339 kg).Body mass index is 23.83 kg/(m^2).  General Appearance: Well Groomed  Engineer, water::  Good  Speech:  Clear and Coherent  Volume:  Normal  Mood:  improved   Affect:  Labile but at this time reactive, pleasant  Thought Process:  Coherent and Goal Directed  Orientation:  Full (Time, Place, and Person)  Thought Content:  no hallucinations, no delusions, not internally preoccupied at present  Suicidal Thoughts:  No - currently denies any suicidal ideations  Homicidal Thoughts:  No  Memory:  Recent and remote grossly intact   Judgement:  Intact  Insight:  Present  Psychomotor Activity:  Normal  Concentration:  Good  Recall:  Good  Fund of Knowledge:Fair  Language: Good  Akathisia:  No  Handed:  Right  AIMS (if indicated):     Assets:  Communication Skills Desire for Improvement  Sleep:  Number of Hours: 6.75   Musculoskeletal: Strength & Muscle Tone: within normal limits Gait & Station: normal Patient leans: N/A  Current Medications: Current Facility-Administered Medications  Medication Dose Route Frequency Provider Last Rate Last Dose  . acetaminophen (TYLENOL) tablet 650 mg  650 mg Oral Q6H PRN Elmarie Shiley, NP   650 mg at 01/09/15 1653  . alum & mag hydroxide-simeth (MAALOX/MYLANTA) 200-200-20 MG/5ML suspension 30 mL  30 mL Oral Q4H PRN Benjamine Mola, FNP   30 mL at 01/11/15 2108  . benzocaine (ORAJEL) 10 % mucosal gel   Mouth/Throat QID PRN Elmarie Shiley, NP      . feeding supplement (ENSURE COMPLETE) (ENSURE COMPLETE) liquid 237 mL  237 mL Oral BID BM Encarnacion Slates, NP   237 mL at 01/12/15 1420  . hydrOXYzine (ATARAX/VISTARIL) tablet 25 mg  25 mg Oral Q6H PRN Benjamine Mola,  FNP      . ibuprofen (ADVIL,MOTRIN) tablet 600 mg  600 mg Oral Q6H PRN Elmarie Shiley, NP   600 mg at 01/12/15 1049  . magnesium hydroxide (MILK OF MAGNESIA) suspension 30 mL  30 mL Oral Daily PRN Benjamine Mola, FNP      . multivitamin with minerals tablet 1 tablet  1 tablet Oral Daily Encarnacion Slates, NP   1 tablet at 01/12/15 0751  . penicillin v potassium (VEETID) tablet 500 mg  500 mg Oral 4 times per day Benjamine Mola, FNP   500 mg at 01/12/15 1249  .  QUEtiapine (SEROQUEL) tablet 300 mg  300 mg Oral QHS Jenne Campus, MD   300 mg at 01/11/15 2109  . QUEtiapine (SEROQUEL) tablet 50 mg  50 mg Oral Daily Encarnacion Slates, NP   50 mg at 01/12/15 3112    Lab Results: No results found for this or any previous visit (from the past 48 hour(s)).  Physical Findings: AIMS: Facial and Oral Movements Muscles of Facial Expression: None, normal Lips and Perioral Area: None, normal Jaw: None, normal Tongue: None, normal,Extremity Movements Upper (arms, wrists, hands, fingers): None, normal Lower (legs, knees, ankles, toes): None, normal, Trunk Movements Neck, shoulders, hips: None, normal, Overall Severity Severity of abnormal movements (highest score from questions above): None, normal Incapacitation due to abnormal movements: None, normal Patient's awareness of abnormal movements (rate only patient's report): No Awareness, Dental Status Current problems with teeth and/or dentures?: Yes Does patient usually wear dentures?: No  CIWA:  CIWA-Ar Total: 3 COWS:  COWS Total Score: 2   Assessment- continues to improve/stabilize. Starting to focus more on disposition plan/more future oriented, and describes desire to [facility] to [facility] if possible, partly to decrease risk of relapse. Tolerating medications well.   Treatment Plan Summary: Daily contact with patient to assess and evaluate symptoms and progress in treatment Medication management  Plan: Treatment Plan Summary: Daily contact with patient to assess and evaluate symptoms and progress in treatment Medication management  Plan:  Continue inpatient treatment. Consider discharge soon as she continues to stabilize  Continue Seroquel to  50 mgrs QAM and 300 mgrs QHS     Medical Decision Making Problem Points:  Established problem, stable/improving (1) and Review of psycho-social stressors (1) Data Points:  Review of medication regiment & side effects (2)  I certify that inpatient  services furnished can reasonably be expected to improve the patient's condition.   Neita Garnet,  01/12/2015, 5:55 PM

## 2015-01-12 NOTE — Tx Team (Signed)
Interdisciplinary Treatment Plan Update   Date Reviewed:  01/12/2015  Time Reviewed:  8:25 AM  Progress in Treatment:   Attending groups: Yes Participating in groups: Yes, patient engages in discussions Taking medication as prescribed: Yes  Tolerating medication: Yes Family/Significant other contact made:  No, patient declined collateral contact Patient understands diagnosis.Yes, patient understands diagnosis and need for treatment. Discussing patient identified problems/goals with staff: Yes, patient is able to express goals for treatment and discharge. Medical problems stabilized or resolved: Yes Denies suicidal/homicidal ideation: No, but contracts for safety Patient has not harmed self or others: Yes  For review of initial/current patient goals, please see plan of care.  Estimated Length of Stay:  3-5 days Reasons for Continued Hospitalization:  Anxiety Depression Medication stabilization Suicidal ideation  New Problems/Goals identified:    Discharge Plan or Barriers:   Home with outpatient follow up will be with [facility]  Additional Comments:  This is an admission assessment for [name], a 54 year old African-American female. [name] has been a patient in this hospital x numerous times, all related to mood instability & polysubstance dependence. She reports, "[facility] because I was very depressed, suicidal a& homicidal the same time. I had lost my medicine 2 weeks ago and had not been on it. As a result, I relapsed on cocaine and weed. I have been using & smoking for about 1 week. Prior to relapsing a week ago, I was sober from drugs x 1 year. I was doing very well until I lost my medicine. I need to get back on my medicine (Seroquel) or I will start hearing voices again. I know that is coming if I don't get back on my medicine".  Patient and CSW reviewed patient's identified goals and treatment plan.  Patient verbalized  understanding and agreed to treatment plan.   Attendees:  Patient:  01/12/2015 8:25 AM   Signature:  Sallyanne HaversF. Cobos, MD 01/12/2015 8:25 AM  Signature:  01/12/2015 8:25 AM  Signature:  Earl ManySara Twyman RN 01/12/2015 8:25 AM  Signature:Beverly Terrilee CroakKnight, RN 01/12/2015 8:25 AM  Signature:   01/12/2015 8:25 AM  Signature:  Juline PatchQuylle Nelly Scriven, LCSW 01/12/2015 8:25 AM  Signature:  Belenda CruiseKristin Drinkard, LCSW-A 01/12/2015 8:25 AM  Signature:  Leisa LenzValerie Enoch, Care Coordinator [facility] 01/12/2015 8:25 AM  Signature:  Aloha GellKrista Dopson, RN 01/12/2015 8:25 AM  Signature:  01/12/2015  8:25 AM  Signature:   [name], RN [facility] 01/12/2015  8:25 AM  Signature:  Santa GeneraAnne Cunningham Lead Social Worker LCSW 01/12/2015  8:25 AM    Scribe for Treatment Team:   Juline PatchQuylle Dera Vanaken,  01/12/2015 8:25 AM

## 2015-01-12 NOTE — Plan of Care (Signed)
Problem: Consults Goal: Suicide Risk Patient Education (See Patient Education module for education specifics)  Outcome: Completed/Met Date Met:  01/12/15 Nurse discussed suicidal information with patient.        

## 2015-01-12 NOTE — Progress Notes (Signed)
Pt attended spiritual care group on grief and loss facilitated by chaplain Burnis KingfisherMatthew Stalnaker and counseling intern [place] Lorenzo Arscott. Group opened with brief discussion and psycho-social ed around grief and loss in relationships and in relation to self - identifying life patterns, circumstances, changes that cause losses. Established group norm of speaking from own life experience. Group goal of establishing open and affirming space for members to share loss and experience with grief, normalize grief experience and provide psycho social education and grief support. Group drew on narrative and Alderian therapeutic modalities.   [name] was present throughout group and was very supportive to other group members. [name] shared how [facility] and the positive impact of being in a non-judgmental environment. Feared leaving and the possibility of "failing" again, and expressed a need to have a safe place to talk upon discharging. Facilitators validated this and suggested community-based support groups.   [place] Kylin Dubs Counseling Intern

## 2015-01-12 NOTE — [facility] Suicide Risk Assessment (Signed)
[facility] INPATIENT:  Family/Significant Other Suicide Prevention Education  Suicide Prevention Education:  Patient Refusal for Family/Significant Other Suicide Prevention Education: The patient [name] has refused to provide written consent for family/significant other to be provided Family/Significant Other Suicide Prevention Education during admission and/or prior to discharge.  Physician notified.  Wynn BankerHodnett, Adaia Matthies Hairston 01/12/2015, 3:28 PM

## 2015-01-12 NOTE — Progress Notes (Signed)
Adult Psychoeducational Group Note  Date:  01/12/2015 Time:  9:33 PM  Group Topic/Focus:  Wrap-Up Group:   The focus of this group is to help patients review their daily goal of treatment and discuss progress on daily workbooks.  Participation Level:  Active  Participation Quality:  Appropriate and Attentive  Affect:  Appropriate  Cognitive:  Appropriate  Insight: Appropriate  Engagement in Group:  Engaged  Modes of Intervention:  Discussion  Additional Comments:  Pts discussed the theme for the day "Recovery." Pt was able to come up with 2 things she can do to help maintain her recovery: 1) change people, places, and things 2) stop looking for a way out.  Caswell CorwinOwen, Jerimy Johanson C 01/12/2015, 9:33 PM

## 2015-01-12 NOTE — Progress Notes (Signed)
D   Pt is appropriate and pleasant   She attends and participates in groups and she is compliant with treatment     She continues to endorse some depression and anxiety but reports much improvement    A   Verbal support given   Medications administered and effectiveness monitored     Q 15 min checks R   Pt safe at present

## 2015-01-12 NOTE — Progress Notes (Signed)
[facility] Group Notes:  (Nursing/MHT/Case Management/Adjunct)  Date:  01/12/2015  Time:  9:20 PM  Type of Therapy:  AA Meeting  Participation Level:  Did not attend  Summary of Progress/Problems: Pt was invited however chose not to attend  Caswell CorwinOwen, Kanon Novosel C 01/12/2015, 9:20 PM

## 2015-01-12 NOTE — Progress Notes (Signed)
D   Pt endorses depression and problems sleeping    She is anxious and depressed    She interacts appropriately but minimally with her peers and attends groups A   Verbal support given   Medications administered and effectiveness monitored   Q 15 min checks R   Pt safe at present

## 2015-01-13 NOTE — Progress Notes (Signed)
D:  Patient's self inventory sheet, patient has fair sleep.  Fair appetite, high energy level, poor concentration.  Rated depression, hopeless and anxiety #5.  Denied withdrawals.  Has been irritable.  Denied SI.  Denied physical problems.  Has experienced headache in past 24 hours, worst pain #7.  Pain medication is helpful.  Goal is to go home.  No discharge plan.  No problems anticipated after discharge. A:  Medications administered per MD orders.  Emotional support and encouragement given patient. R:  Denied SI and HI.  Denied A/V hallucinations.  Contracts for safety.  Safety maintained with 15 minute checks.

## 2015-01-13 NOTE — Progress Notes (Signed)
Pt attended group this evening. 

## 2015-01-13 NOTE — Plan of Care (Signed)
Problem: Consults Goal: Substance Abuse Patient Education See Patient Education Module for education specifics.  Outcome: Completed/Met Date Met:  01/13/15 Nurse discussed substance abuse with patient.        

## 2015-01-13 NOTE — [facility] Group Notes (Signed)
[facility] LCSW After[facility] Discharge Planning Group Note   01/13/2015 11:15 AM    Participation Quality:  Appropraite  Mood/Affect:  Appropriate  Depression Rating:  5  Anxiety Rating: 4   Thoughts of Suicide:  No  Will you contract for safety?   NA  Current AVH:  No  Plan for Discharge/Comments:  Patient attended discharge planning group and actively participated in group. Patient advised [place].  CSW to follow up with patient to obtain new address in order to arrange outpatient services.  Suicide prevention education reviewed and SPE document provided.   Transportation Means: Patient has transportation.   Supports:  Patient has a support system.   Mishka Stegemann, Joesph JulyQuylle Hairston

## 2015-01-13 NOTE — Progress Notes (Signed)
Patient ID: [name], female   DOB: 01-02-1961, 54 y.o.   MRN: [id number] [facility] MD Progress Note  01/13/2015 3:51 PM [name]  MRN:  [id number]  Subjective:   Patient reports overall improvement and is currently focusing on discharge tomorrow, disposition planning.  Objective :  I have discussed case with treatment team and met with patient. As noted in prior notes, patient [name] from [place], as she states this may be a better environment for her. She states she has " a lot of negative people around me here " ( referring to [name]), which she states make it more challenging for her to maintain a positive outlook and remain sober. States " to be honest , I am basically running away from those type of people" At present patient presents calm, pleasant , with reactive affect. She has been going to groups and on unit has remained calm. She is tolerating medications well and at present denies medication side effects.     Diagnosis:   DSM5: Schizophrenia Disorders: NA Obsessive-Compulsive Disorders:  NA Trauma-Stressor Disorders:  NA Substance/Addictive Disorders:  Cocaine dependence Depressive Disorders:  Bipolar I disorder, most recent episode (or current) manic  Total Time spent with patient: 20 minutes  ADL's: improved   Sleep: Good  Appetite:  Good  Suicidal Ideation:  Currently denies   Homicidal Ideation:  Currently denies   AEB (as evidenced by):  Psychiatric Specialty Exam: Physical Exam  ROS  Blood pressure 120/65, pulse 78, temperature 97.5 F (36.4 C), temperature source Oral, resp. rate 16, height 5' (1.524 m), weight 122 lb (55.339 kg).Body mass index is 23.83 kg/(m^2).  General Appearance: Well Groomed  Engineer, water::  Good  Speech:  Clear and Coherent  Volume:  Normal  Mood:  Euthymic and at this time denies depression  Affect:  Reactive pleasant  Thought Process:  Coherent and Goal  Directed  Orientation:  Full (Time, Place, and Person)  Thought Content:  no hallucinations, no delusions, not internally preoccupied at present  Suicidal Thoughts:  No - currently denies any suicidal ideations  Homicidal Thoughts:  No  Memory:  Recent and remote grossly intact   Judgement:  Intact  Insight:  Present  Psychomotor Activity:  Normal  Concentration:  Good  Recall:  Good  Fund of Knowledge:Fair  Language: Good  Akathisia:  No  Handed:  Right  AIMS (if indicated):     Assets:  Communication Skills Desire for Improvement  Sleep:  Number of Hours: 6.25   Musculoskeletal: Strength & Muscle Tone: within normal limits Gait & Station: normal Patient leans: N/A  Current Medications: Current Facility-Administered Medications  Medication Dose Route Frequency Provider Last Rate Last Dose  . acetaminophen (TYLENOL) tablet 650 mg  650 mg Oral Q6H PRN Stephanie Shiley, NP   650 mg at 01/09/15 1653  . alum & mag hydroxide-simeth (MAALOX/MYLANTA) 200-200-20 MG/5ML suspension 30 mL  30 mL Oral Q4H PRN Stephanie Mola, FNP   30 mL at 01/11/15 2108  . benzocaine (ORAJEL) 10 % mucosal gel   Mouth/Throat QID PRN Stephanie Shiley, NP      . feeding supplement (ENSURE COMPLETE) (ENSURE COMPLETE) liquid 237 mL  237 mL Oral BID BM Stephanie Slates, NP   237 mL at 01/13/15 1045  . hydrOXYzine (ATARAX/VISTARIL) tablet 25 mg  25 mg Oral Q6H PRN Stephanie Mola, FNP      . ibuprofen (ADVIL,MOTRIN) tablet 600 mg  600 mg  Oral Q6H PRN Stephanie Shiley, NP   600 mg at 01/12/15 1049  . magnesium hydroxide (MILK OF MAGNESIA) suspension 30 mL  30 mL Oral Daily PRN Stephanie Mola, FNP      . multivitamin with minerals tablet 1 tablet  1 tablet Oral Daily Stephanie Slates, NP   1 tablet at 01/13/15 0813  . penicillin v potassium (VEETID) tablet 500 mg  500 mg Oral 4 times per day Stephanie Mola, FNP   500 mg at 01/13/15 1256  . QUEtiapine (SEROQUEL) tablet 300 mg  300 mg Oral QHS Stephanie Campus, MD   300 mg at 01/12/15 2110   . QUEtiapine (SEROQUEL) tablet 50 mg  50 mg Oral Daily Stephanie Slates, NP   50 mg at 01/13/15 5927    Lab Results: No results found for this or any previous visit (from the past 48 hour(s)).  Physical Findings: AIMS: Facial and Oral Movements Muscles of Facial Expression: None, normal Lips and Perioral Area: None, normal Jaw: None, normal Tongue: None, normal,Extremity Movements Upper (arms, wrists, hands, fingers): None, normal Lower (legs, knees, ankles, toes): None, normal, Trunk Movements Neck, shoulders, hips: None, normal, Overall Severity Severity of abnormal movements (highest score from questions above): None, normal Incapacitation due to abnormal movements: None, normal Patient's awareness of abnormal movements (rate only patient's report): No Awareness, Dental Status Current problems with teeth and/or dentures?: Yes Does patient usually wear dentures?: No  CIWA:  CIWA-Ar Total: 1 COWS:  COWS Total Score: 1   Assessment- patient currently improved and focusing on discharge. SW/treatment team helping her set up outpatient psychiatric treatment in [name] area where she plans to relocate. Tolerating medications well. No symptoms of mania at this time.   Treatment Plan Summary: Daily contact with patient to assess and evaluate symptoms and progress in treatment Medication management  Plan: Treatment Plan Summary: Daily contact with patient to assess and evaluate symptoms and progress in treatment Medication management  Plan:  Continue inpatient treatment.  Continue Seroquel 50 mgrs QAM and 300 mgrs QHS     Medical Decision Making Problem Points:  Established problem, stable/improving (1) and Review of psycho-social stressors (1) Data Points:  Review of medication regiment & side effects (2)  I certify that inpatient services furnished can reasonably be expected to improve the patient's condition.   [name],  01/13/2015, 3:51 PM

## 2015-01-14 ENCOUNTER — Encounter (HOSPITAL_COMMUNITY): Payer: Self-pay | Admitting: Nurse Practitioner

## 2015-01-14 MED ORDER — QUETIAPINE FUMARATE 300 MG PO TABS: 300.0000 mg | ORAL_TABLET | Freq: Every day | ORAL | Status: DC

## 2015-01-14 MED ORDER — QUETIAPINE FUMARATE 50 MG PO TABS: 50.0000 mg | ORAL_TABLET | Freq: Every day | ORAL | Status: DC

## 2015-01-14 MED ORDER — HYDROXYZINE HCL 25 MG PO TABS: 25.0000 mg | ORAL_TABLET | Freq: Four times a day (QID) | ORAL | Status: DC | PRN

## 2015-01-14 MED ORDER — PENICILLIN V POTASSIUM 500 MG PO TABS: 500.0000 mg | ORAL_TABLET | Freq: Four times a day (QID) | ORAL | Status: DC

## 2015-01-14 MED ORDER — ADULT MULTIVITAMIN W/MINERALS CH: 1.0000 | ORAL_TABLET | Freq: Every day | ORAL | Status: DC

## 2015-01-14 NOTE — [facility] Suicide Risk Assessment (Signed)
[facility]   Demographic Factors:  54 year old female. On disability  Total Time spent with patient: 30 minutes  Musculoskeletal: Strength & Muscle Tone: within normal limits Gait & Station: normal Patient leans: N/A  Psychiatric Specialty Exam: Physical Exam  ROS  Blood pressure 113/51, pulse 89, temperature 98 F (36.7 C), temperature source Oral, resp. rate 16, height 5' (1.524 m), weight 122 lb (55.339 kg).Body mass index is 23.83 kg/(m^2).  General Appearance: improved grooming  Eye Contact::  Good  Speech:  Normal Rate  Volume:  Normal  Mood:  Euthymic  Affect:  Appropriate  Thought Process:  Goal Directed and Linear  Orientation:  Full (Time, Place, and Person)  Thought Content:  denies hallucinations, no delusions  Suicidal Thoughts:  No at this time denies any thoughts of hurting self or anyone else   Homicidal Thoughts:  No  Memory:  recent and remote grossly intact   Judgement:  Other:  improved  Insight:  Fair  Psychomotor Activity:  Normal  Concentration:  Good  Recall:  Good  Fund of Knowledge:Good  Language: Good  Akathisia:  No  Handed:  Right  AIMS (if indicated):     Assets:  Desire for Improvement Physical Health Resilience  Sleep:  Number of Hours: 5.75  Cognition: WNL  ADL's:  Intact   Have you used any form of tobacco in the last 30 days? (Cigarettes, Smokeless Tobacco, Cigars, and/or Pipes): Yes  Has this patient used any form of tobacco in the last 30 days? (Cigarettes, Smokeless Tobacco, Cigars, and/or Pipes) Yes, Prescription not provided because: patient states she s mokes only 2-3 cigarettes per day and is not currently interested in quitting  Mental Status Per Nursing Assessment::   On Admission:  Suicidal ideation indicated by patient, Self-harm thoughts, Thoughts of violence towards others  Current Mental Status by Physician: At this time patient is improved, today presents euthymic, with full range of  affect, no thought disorder, no suicidal or homicidal ideations, no psychotic symptoms, she is future oriented, and plans to move to [place], [place] next week  Loss Factors: Disability, limited support system  Historical Factors: History of Bipolar Disorder, history of several prior admissions, history of  Cocaine /Cannabis Abuse   Risk Reduction Factors:   Sense of responsibility to family and Positive coping skills or problem solving skills  Continued Clinical Symptoms:  At this time is improved, presents euthymic, no thought disorder, no grandiosity noted, not irritable, no psychotic symptoms, no SI or HI, future oriented. States she is focused on maintaining sobriety  Cognitive Features That Contribute To Risk:  At this time alert, attentive, oriented x 3   Suicide Risk:  Mild:  Suicidal ideation of limited frequency, intensity, duration, and specificity.  There are no identifiable plans, no associated intent, mild dysphoria and related symptoms, good self-control (both objective and subjective assessment), few other risk factors, and identifiable protective factors, including available and accessible social support.  Principal Problem: Bipolar I disorder, most recent episode (or current) manic Discharge Diagnoses:  Patient Active Problem List   Diagnosis Date Noted  . Bipolar I disorder, most recent episode (or current) manic [F31.10] 01/09/2015  . Cannabis dependence [F12.20] 01/09/2015  . Suicidal ideation [R45.851]   . Homicidal ideation [R45.850]   . Bipolar 1 disorder [F31.9] 07/23/2014  . MDD (major depressive disorder) [F32.2] 07/07/2014  . Cocaine dependence [F14.20] 01/08/2013  . Bipolar disorder [F31.9] 01/08/2013  . Hyperglycemia [R73.9] 09/04/2012  . UTI (lower  urinary tract infection) [N39.0] 09/04/2012  . Hallucination [R44.3] 08/31/2012  . Cocaine abuse [F14.10] 12/07/2011  . Psychosis [F29] 11/16/2011    Class: Acute  . Cocaine addiction [F14.20] 11/16/2011     Class: Chronic  . Polysubstance abuse [F19.10] 11/14/2011    Plan Of Care/Follow-up recommendations:  Activity:  As tolerated Diet:  Regular Tests:  NA Other:  See below  Is patient on multiple antipsychotic therapies at discharge:  No   Has Patient had three or more failed trials of antipsychotic monotherapy by history:  No  Recommended Plan for Multiple Antipsychotic Therapies: NA  Patient is leaving unit in good spirits. Plans to relocate to [place], [place] soon. Follow up as below- Patient to Follow up at: Follow-up Information    Follow up with [facility] On 01/15/2015.   Why: Please go to [facility]'s walk in clinic on Friday, January 15, 2015 or any weekday between 8AM - 3PM for medication management/counseling   Contact information:   [address] [place], [place] [address]  [phone]   ( She will transfer psychiatric care to [facility] area upon moving). Encouraged to attend 12 step meetings regularly.    Diem Dicocco 01/14/2015, 12:55 PM

## 2015-01-14 NOTE — Progress Notes (Signed)
Patient discharged per MD order.  Patient received all personal belongings, prescriptions and medication samples.  She denies SI/HI/AVH.  Patient's discharge instructions and medications reviewed and she indicated understanding.  Patient left ambulatory with a friend.

## 2015-01-14 NOTE — Discharge Summary (Signed)
Physician Discharge Summary Note  Patient:  [name] is an 54 y.o., female MRN:  [id number] DOB:  04/03/1961 Patient phone:  [phone] (home)  Patient address:   [address] [place] [address],  Total Time spent with patient: 30 minutes  Date of Admission:  01/08/2015 Date of Discharge: 01/14/2015  Reason for Admission:  Depression, Suicidal and homicidal ideation  Principal Problem: Bipolar I disorder, most recent episode (or current) manic Discharge Diagnoses:  DSM5: Schizophrenia Disorders: NA Obsessive-Compulsive Disorders: NA Trauma-Stressor Disorders: NA Substance/Addictive Disorders: Cocaine dependence Depressive Disorders: Bipolar I disorder, most recent episode (or current) manic   Patient Active Problem List   Diagnosis Date Noted  . Bipolar I disorder, most recent episode (or current) manic [F31.10] 01/09/2015  . Cannabis dependence [F12.20] 01/09/2015  . Suicidal ideation [R45.851]   . Homicidal ideation [R45.850]   . Bipolar 1 disorder [F31.9] 07/23/2014  . MDD (major depressive disorder) [F32.2] 07/07/2014  . Cocaine dependence [F14.20] 01/08/2013  . Bipolar disorder [F31.9] 01/08/2013  . Hyperglycemia [R73.9] 09/04/2012  . UTI (lower urinary tract infection) [N39.0] 09/04/2012  . Hallucination [R44.3] 08/31/2012  . Cocaine abuse [F14.10] 12/07/2011  . Psychosis [F29] 11/16/2011    Class: Acute  . Cocaine addiction [F14.20] 11/16/2011    Class: Chronic  . Polysubstance abuse [F19.10] 11/14/2011   Musculoskeletal: Strength & Muscle Tone: within normal limits Gait & Station: normal Patient leans: N/A  Psychiatric Specialty Exam: Physical Exam  Vitals reviewed.   Review of Systems  Constitutional: Negative.   HENT: Negative.   Eyes: Negative.   Respiratory: Negative.   Cardiovascular: Negative.   Gastrointestinal: Negative.   Genitourinary: Negative.   Musculoskeletal: Negative.   Skin: Negative.   Neurological: Negative.    Endo/Heme/Allergies: Negative.   Psychiatric/Behavioral: Positive for depression (Hx of, chronic). Negative for suicidal ideas, hallucinations, memory loss and substance abuse. The patient is nervous/anxious (Hx of, chronic). The patient does not have insomnia.     Blood pressure 120/65, pulse 78, temperature 97.5 F (36.4 C), temperature source Oral, resp. rate 16, height 5' (1.524 m), weight 55.339 kg (122 lb).Body mass index is 23.83 kg/(m^2).  Past Medical History:  Past Medical History  Diagnosis Date  . Depression   . Bipolar 1 disorder   . Drug abuse     Past Surgical History  Procedure Laterality Date  . No past surgeries     Family History:  Family History  Problem Relation Age of Onset  . Anesthesia problems Neg Hx   . Hypotension Neg Hx   . Malignant hyperthermia Neg Hx   . Pseudochol deficiency Neg Hx    Social History:  History  Alcohol Use  . 15.0 oz/week  . 25 Cans of beer per week    Comment: Weekly      History  Drug Use  . Yes  . Special: "Crack" cocaine, Marijuana    Comment: Uses as much cocaine as she can get.    History   Social History  . Marital Status: Single    Spouse Name: N/A    Number of Children: N/A  . Years of Education: N/A   Social History Main Topics  . Smoking status: Current Every Day Smoker -- 0.50 packs/day for 10 years    Types: Cigarettes  . Smokeless tobacco: Never Used  . Alcohol Use: 15.0 oz/week    25 Cans of beer per week     Comment: Weekly   . Drug Use: Yes    Special: "Crack"  cocaine, Marijuana     Comment: Uses as much cocaine as she can get.  Marland Kitchen. Sexual Activity: Yes    Birth Control/ Protection: Condom   Other Topics Concern  . None   Social History Narrative   Past Psychiatric History: Diagnosis: Bipolar affective disorder  Hospitalizations: [facility] x numerous times  Outpatient Care: [facility]  Substance Abuse Care: None reported  Self-Mutilation: Patient denies  Suicidal Attempts: Patient denies   Violent Behaviors: Patient denies   Risk to Self: Is patient at risk for suicide?: Yes What has been your use of drugs/alcohol within the last 12 months?: marijuana - 1/2 joint and cocaine - $50 worth one time use 1 week ago, reports not using 7 months prior to that Risk to Others:   Prior Inpatient Therapy:   Prior Outpatient Therapy:    Level of Care:  OP  Hospital Course:   [name] is a 54 year old African-American female who has a history of multiple hospital visits for.  [name] has been a patient mood instability & polysubstance dependence.  She reports, "[facility] because I was very depressed, suicidal & homicidal the same time. I had lost my medicine 2 weeks ago and had not been on it. As a result, I relapsed on cocaine and weed. I have been using & smoking for about 1 week. Prior to relapsing a week ago, I was sober from drugs x 1 year. I was doing very well until I lost my medicine. I need to get back on my medicine (Seroquel) or I will start hearing voices again. I know that is coming if I don't get back on my medicine".  [name] and medication intervention to re-stabilize her moods.  She tolerated Seroquel and reported no adverse side effects.  Her moods gradually improved.  She verbalized that part of the solution was to avoid negative people around her.  She states moving to [place] so that she can maintain positive outlook and maintain sobriety.  [facility]    Consults:  psychiatry  Significant Diagnostic Studies:  labs: per ED, cocaine and THC on positive UDS  Discharge Vitals:   Blood pressure 120/65, pulse 78, temperature 97.5 F (36.4 C), temperature source Oral, resp. rate 16, height 5' (1.524 m), weight 55.339 kg (122 lb). Body mass index is 23.83 kg/(m^2). Lab Results:   No results found for this or any previous visit (from the past 72  hour(s)).  Physical Findings: AIMS: Facial and Oral Movements Muscles of Facial Expression: None, normal Lips and Perioral Area: None, normal Jaw: None, normal Tongue: None, normal,Extremity Movements Upper (arms, wrists, hands, fingers): None, normal Lower (legs, knees, ankles, toes): None, normal, Trunk Movements Neck, shoulders, hips: None, normal, Overall Severity Severity of abnormal movements (highest score from questions above): None, normal Incapacitation due to abnormal movements: None, normal Patient's awareness of abnormal movements (rate only patient's report): No Awareness, Dental Status Current problems with teeth and/or dentures?: Yes Does patient usually wear dentures?: No  CIWA:  CIWA-Ar Total: 1 COWS:  COWS Total Score: 1   See Psychiatric Specialty Exam and Suicide Risk Assessment completed by Attending Physician prior to discharge.  Discharge destination:  Home  Is patient on multiple antipsychotic therapies at discharge:  No   Has Patient had three or more failed trials of antipsychotic monotherapy by history:  No  Recommended Plan for Multiple Antipsychotic  Therapies: NA    Medication List    TAKE these medications      Indication   hydrOXYzine 25 MG tablet  Commonly known as:  ATARAX/VISTARIL  Take 1 tablet (25 mg total) by mouth every 6 (six) hours as needed for anxiety.   Indication:  Anxiety Neurosis     multivitamin with minerals Tabs tablet  Take 1 tablet by mouth daily.   Indication:  Low Vitamin     penicillin v potassium 500 MG tablet  Commonly known as:  VEETID  Take 1 tablet (500 mg total) by mouth every 6 (six) hours.   Indication:  infection     QUEtiapine 300 MG tablet  Commonly known as:  SEROQUEL  Take 1 tablet (300 mg total) by mouth at bedtime.   Indication:  Mood control     QUEtiapine 50 MG tablet  Commonly known as:  SEROQUEL  Take 1 tablet (50 mg total) by mouth daily.   Indication:  Agitation/mood control            Follow-up Information    Follow up with [facility] On 01/15/2015.   Why:  Please go to [facility]'s walk in clinic on Friday, January 15, 2015 or any weekday between 8AM - 3PM for medication management/counseling   Contact information:   [address] [place], [place]   [address]  [phone]      Follow-up recommendations:  Activity:  as tol, diet as tol  Comments:  1.  Take all your medications as prescribed.              2.  Report any adverse side effects to outpatient provider.                       3.  Patient instructed to not use alcohol or illegal drugs while on prescription medicines.            4.  In the event of worsening symptoms, instructed patient to call 911, the crisis hotline or go to nearest emergency room for evaluation of symptoms.  Total Discharge Time:  30 min  Signed: Adonis Brook MAY, AGNP-BC 01/14/2015, 10:54 AM   Patient seen, Suicide Assessment Completed.  Disposition Plan Reviewed

## 2015-01-14 NOTE — Progress Notes (Signed)
  [facility] :  Will you be returning to the same living situation after discharge:  Yes,  Patient is returning to her home. At discharge, do you have transportation home?: Yes,  Patient assisted with city bus pass. Do you have the ability to pay for your medications: Yes,  Patient can obtain medications.  Release of information consent forms completed and in the chart;  Patient's signature needed at discharge.  Patient to Follow up at: Follow-up Information    Follow up with [facility] On 01/15/2015.   Why:  Please go to [facility]'s walk in clinic on Friday, January 15, 2015 or any weekday between 8AM - 3PM for medication management/counseling   Contact information:   [address] [place], [place]   [address]  [phone]      Patient denies SI/HI: Patient no longer endorsing SI/HI or other thoughts of self harm.  Safety Planning and Suicide Prevention discussed: .Reviewed with all patients during discharge planning group  Has patient been referred to the Quitline?:  Patient declined referral to Quitline. Wynn BankerHodnett, Mysty Kielty Hairston 01/14/2015, 9:27 AM

## 2015-01-14 NOTE — Progress Notes (Signed)
Patient ID: [name], female   DOB: [birth year], 54 y.o.   MRN: [id number] Adult Psychoeducational Group Note  Date:  01/14/2015 Time: 0905  Group Topic/Focus:  Goals Group:   The focus of this group is to help patients establish daily goals to achieve during treatment and discuss how the patient can incorporate goal setting into their daily lives to aide in recovery.  Participation Level:  Active  Participation Quality:  Appropriate  Affect:  Blunted  Cognitive:  Alert  Insight: Improving  Engagement in Group:  Defensive and Improving  Modes of Intervention:  Discussion, Education, Orientation and Support  Additional Comments: Pt actively participated in group. Pt able to identify a short term and long term goal.   [name], [name] 01/14/2015, 11:10 AM

## 2015-01-14 NOTE — [facility] Group Notes (Signed)
[facility] LCSW Group Therapy  Emotional Regulation 1:15 - 2: 30 PM Late Entry for 01/13/15       01/14/2015     Type of Therapy:  Group Therapy  Participation Level:  Appropriate  Participation Quality:  Appropriate  Affect:  Appropriate  Cognitive:  Attentive Appropriate  Insight:  Developing/Improving Engaged  Engagement in Therapy:  Developing/Improving Engaged  Modes of Intervention:  Discussion Exploration Problem-Solving Supportive  Summary of Progress/Problems:  Group topic was emotional regulations.  Patient participated in the discussion and was able to identify an emotion that needed to regulated. Patient shared she controls her emotions by shutting down when she is not doing well.  She shared she used to have a bad anger problem and would fight but as she has grown older, she knows that is not the way to handle problems.   Wynn BankerHodnett, Kemberly Taves Hairston 01/14/2015

## 2015-01-14 NOTE — Progress Notes (Signed)
Patient ID: [name], female   DOB: 02/27/1961, 54 y.o.   MRN: [id number]  Pt currently presents with a blunted affect and cooperative behavior. Per self inventory, pt rates depression, hopelessness and anxiety at 3. Pt's daily goal is to "stay on meds." Pt reports fair sleep, good concentration and a good appetite. Pt reports some agitation.  Pt provided with medications per providers orders. Pt's labs and vitals were monitored throughout the day. Pt supported emotionally and encouraged to express concerns and questions. Pt educated on medications and alternative stress management techniques.  Pt's safety ensured with 15 minute and environmental checks. Pt currently denies SI/HI and A/V hallucinations. Pt verbally agrees to seek staff if SI/HI or A/VH occurs and to consult with staff before acting on these thoughts. Pt to be discharged today.

## 2015-01-18 NOTE — Progress Notes (Signed)
Patient Discharge Instructions:  After Visit Summary (AVS):   Faxed to:  01/18/15 Discharge Summary Note:   Faxed to:  01/18/15 Psychiatric Admission Assessment Note:   Faxed to:  01/18/15 Suicide Risk Assessment - Discharge Assessment:   Faxed to:  01/18/15 Faxed/Sent to the Next Level Care provider:  01/18/15 [facility] to [facility] @ [phone]  Jerelene ReddenSheena E Genoa, 01/18/2015, 4:04 PM

## 2015-04-07 ENCOUNTER — Emergency Department (HOSPITAL_COMMUNITY)
Admission: EM | Admit: 2015-04-07 | Discharge: 2015-04-07 | Disposition: A | Discharge status: Home / Self Care | Payer: Medicare Other | Attending: Emergency Medicine | Admitting: Emergency Medicine

## 2015-04-07 ENCOUNTER — Encounter (HOSPITAL_COMMUNITY): Payer: Self-pay | Admitting: *Deleted

## 2015-04-07 DIAGNOSIS — Z79899 Other long term (current) drug therapy: Secondary | ICD-10-CM | POA: Insufficient documentation

## 2015-04-07 DIAGNOSIS — K088 Other specified disorders of teeth and supporting structures: Secondary | ICD-10-CM | POA: Insufficient documentation

## 2015-04-07 DIAGNOSIS — Z720 Tobacco use: Secondary | ICD-10-CM | POA: Insufficient documentation

## 2015-04-07 DIAGNOSIS — F319 Bipolar disorder, unspecified: Secondary | ICD-10-CM | POA: Diagnosis not present

## 2015-04-07 DIAGNOSIS — K0889 Other specified disorders of teeth and supporting structures: Secondary | ICD-10-CM

## 2015-04-07 MED ORDER — PENICILLIN V POTASSIUM 500 MG PO TABS: 500.0000 mg | ORAL_TABLET | Freq: Four times a day (QID) | ORAL | Status: DC

## 2015-04-07 NOTE — ED Notes (Signed)
Pt reports right upper dental pain x 1 week. Airway intact.

## 2015-04-07 NOTE — Discharge Instructions (Signed)

## 2015-04-07 NOTE — ED Provider Notes (Signed)
CSN: [id number]     Arrival date & time 04/07/15  1143 History  This chart was scribed for non-physician practitioner, Roxy Horsemanobert Patsye Sullivant, PA-C, working with Richardean Canalavid H Yao, MD by Charline BillsEssence Howell, ED Scribe. This patient was seen in room TR07C/TR07C and the patient's care was started at 12:10 PM.   Chief Complaint  Patient presents with  . Dental Pain   The history is provided by the patient. No language interpreter was used.   HPI Comments: Stephanie GongKimberly Coulston is a 54 y.o. female, with a h/o depression, bipolar and drug abuse, who presents to the Emergency Department with a chief complaint of persistent R upper dental pain for the past week. Pt states that the tooth has been loose for the past week. No other symptoms at this time. No alleviating or aggravating factors.  Past Medical History  Diagnosis Date  . Depression   . Bipolar 1 disorder   . Drug abuse    Past Surgical History  Procedure Laterality Date  . No past surgeries     Family History  Problem Relation Age of Onset  . Anesthesia problems Neg Hx   . Hypotension Neg Hx   . Malignant hyperthermia Neg Hx   . Pseudochol deficiency Neg Hx    History  Substance Use Topics  . Smoking status: Current Every Day Smoker -- 0.50 packs/day for 10 years    Types: Cigarettes  . Smokeless tobacco: Never Used  . Alcohol Use: 15.0 oz/week    25 Cans of beer per week     Comment: Weekly    OB History    No data available     Review of Systems  Constitutional: Negative for fever and chills.  HENT: Positive for dental problem. Negative for drooling.   Neurological: Negative for speech difficulty.  Psychiatric/Behavioral: Positive for sleep disturbance.   Allergies  Review of patient's allergies indicates no known allergies.  Home Medications   Prior to Admission medications   Medication Sig Start Date End Date Taking? Authorizing Provider  hydrOXYzine (ATARAX/VISTARIL) 25 MG tablet Take 1 tablet (25 mg total) by mouth every 6  (six) hours as needed for anxiety. 01/14/15   Adonis BrookSheila Agustin, NP  Multiple Vitamin (MULTIVITAMIN WITH MINERALS) TABS tablet Take 1 tablet by mouth daily. 01/14/15   Adonis BrookSheila Agustin, NP  penicillin v potassium (VEETID) 500 MG tablet Take 1 tablet (500 mg total) by mouth every 6 (six) hours. 01/14/15   Adonis BrookSheila Agustin, NP  QUEtiapine (SEROQUEL) 300 MG tablet Take 1 tablet (300 mg total) by mouth at bedtime. 01/14/15   Adonis BrookSheila Agustin, NP  QUEtiapine (SEROQUEL) 50 MG tablet Take 1 tablet (50 mg total) by mouth daily. 01/14/15   Adonis BrookSheila Agustin, NP   BP 130/60 mmHg  Pulse 81  Temp(Src) 97.7 F (36.5 C)  Resp 18  Ht 5' (1.524 m)  Wt 123 lb (55.792 kg)  BMI 24.02 kg/m2  SpO2 98% Physical Exam  Constitutional: She is oriented to person, place, and time. She appears well-developed and well-nourished. No distress.  HENT:  Head: Normocephalic and atraumatic.  Mouth/Throat:    Poor dentition throughout.  Affected tooth as diagrammed.  No signs of peritonsillar or tonsillar abscess.  No signs of gingival abscess. Oropharynx is clear and without exudates.  Uvula is midline.  Airway is intact. No signs of Ludwig's angina with palpation of oral and sublingual mucosa.   Eyes: Conjunctivae and EOM are normal.  Neck: Neck supple. No tracheal deviation present.  Cardiovascular: Normal rate.  Pulmonary/Chest: Effort normal. No respiratory distress.  Musculoskeletal: Normal range of motion.  Neurological: She is alert and oriented to person, place, and time.  Skin: Skin is warm and dry.  Psychiatric: She has a normal mood and affect. Her behavior is normal.  Nursing note and vitals reviewed.  ED Course  Procedures (including critical care time) DIAGNOSTIC STUDIES: Oxygen Saturation is 98% on RA, normal by my interpretation.    COORDINATION OF CARE: 12:14 PM-Discussed treatment plan which includes follow-up with dentist with pt at bedside and pt agreed to plan.   Labs Review Labs Reviewed - No data to  display  Imaging Review No results found.   EKG Interpretation None      MDM   Final diagnoses:  Pain, dental    Patient with toothache.  No gross abscess.  Exam unconcerning for Ludwig's angina or spread of infection.  Will treat with penicillin and OTC pain medicine.  Urged patient to follow-up with dentist.    I personally performed the services described in this documentation, which was scribed in my presence. The recorded information has been reviewed and is accurate.    Roxy Horseman, PA-C 04/07/15 1217  Richardean Canal, MD 04/08/15 [phone]

## 2015-04-12 ENCOUNTER — Telehealth (HOSPITAL_COMMUNITY): Payer: Self-pay

## 2015-05-19 ENCOUNTER — Emergency Department (HOSPITAL_COMMUNITY)
Admission: EM | Admit: 2015-05-19 | Discharge: 2015-05-19 | Disposition: A | Discharge status: Home / Self Care | Payer: Medicare Other | Attending: Emergency Medicine | Admitting: Emergency Medicine

## 2015-05-19 ENCOUNTER — Encounter (HOSPITAL_COMMUNITY): Payer: Self-pay

## 2015-05-19 DIAGNOSIS — Z792 Long term (current) use of antibiotics: Secondary | ICD-10-CM | POA: Diagnosis not present

## 2015-05-19 DIAGNOSIS — N898 Other specified noninflammatory disorders of vagina: Secondary | ICD-10-CM | POA: Diagnosis not present

## 2015-05-19 DIAGNOSIS — Z79899 Other long term (current) drug therapy: Secondary | ICD-10-CM | POA: Insufficient documentation

## 2015-05-19 DIAGNOSIS — F319 Bipolar disorder, unspecified: Secondary | ICD-10-CM | POA: Diagnosis not present

## 2015-05-19 DIAGNOSIS — A5901 Trichomonal vulvovaginitis: Secondary | ICD-10-CM | POA: Diagnosis not present

## 2015-05-19 DIAGNOSIS — Z720 Tobacco use: Secondary | ICD-10-CM | POA: Diagnosis not present

## 2015-05-19 DIAGNOSIS — L739 Follicular disorder, unspecified: Secondary | ICD-10-CM | POA: Diagnosis not present

## 2015-05-19 DIAGNOSIS — A599 Trichomoniasis, unspecified: Secondary | ICD-10-CM | POA: Diagnosis not present

## 2015-05-19 LAB — WET PREP, GENITAL: Yeast Wet Prep HPF POC: NONE SEEN

## 2015-05-19 MED ORDER — SULFAMETHOXAZOLE-TRIMETHOPRIM 800-160 MG PO TABS: 1.0000 | ORAL_TABLET | Freq: Two times a day (BID) | ORAL | Status: AC

## 2015-05-19 MED ORDER — QUETIAPINE FUMARATE 300 MG PO TABS: 300.0000 mg | ORAL_TABLET | Freq: Every day | ORAL | Status: DC

## 2015-05-19 MED ORDER — IBUPROFEN 800 MG PO TABS
800.0000 mg | ORAL_TABLET | Freq: Once | ORAL | Status: AC
  Administered 2015-05-19: 800 mg via ORAL
  Filled 2015-05-19: qty 1

## 2015-05-19 MED ORDER — METRONIDAZOLE 500 MG PO TABS: 500.0000 mg | ORAL_TABLET | Freq: Two times a day (BID) | ORAL | Status: DC

## 2015-05-19 MED ORDER — QUETIAPINE FUMARATE 50 MG PO TABS: 50.0000 mg | ORAL_TABLET | Freq: Every day | ORAL | Status: DC

## 2015-05-19 MED ORDER — SULFAMETHOXAZOLE-TRIMETHOPRIM 800-160 MG PO TABS
1.0000 | ORAL_TABLET | Freq: Once | ORAL | Status: AC
  Administered 2015-05-19: 1 via ORAL
  Filled 2015-05-19: qty 1

## 2015-05-19 NOTE — ED Notes (Signed)
Pt with multiple complaints:  Knot on vagina, ? Yeast infection, dental pain, Out of Meds- Seroquel,   Knot x 2 days ago - discharge/odor.  No fever.  With abdominal pain.  Increased urination.

## 2015-05-19 NOTE — Progress Notes (Addendum)
Cm noted 54 yr old [place] pt, no pcp c/o Knot on vagina, ? Yeast infection, dental pain, Out of Meds- Seroquel Pt given a list of medicare internal medicine, dental and mental health providers via medicare.gov within zip [phone] as verified by pt to assist with f/u care Discussed EDP vs pcp or [facility] provider services [facility] until "Tuesday"  Ref[facility] [place] - [facility], or new pcp Pt voiced understanding

## 2015-05-19 NOTE — Discharge Instructions (Signed)

## 2015-05-19 NOTE — ED Provider Notes (Signed)
CSN: [id number]     Arrival date & time 05/19/15  1229 History   First MD Initiated Contact with Patient 05/19/15 1242     Chief Complaint  Patient presents with  . multiple complaints       HPI Patient presents to the emergency department with complaints of vaginal discharge for the past several days as well as a developing bump on the right labia majora.  No drainage from this area on her right labia.  Denies new sexual contacts.  Denies abdominal pain.  Denies fevers or chills.  No nausea or vomiting.  Also requests a refill of her seroquel  Past Medical History  Diagnosis Date  . Depression   . Bipolar 1 disorder   . Drug abuse    Past Surgical History  Procedure Laterality Date  . No past surgeries     Family History  Problem Relation Age of Onset  . Anesthesia problems Neg Hx   . Hypotension Neg Hx   . Malignant hyperthermia Neg Hx   . Pseudochol deficiency Neg Hx    History  Substance Use Topics  . Smoking status: Current Every Day Smoker -- 0.50 packs/day for 10 years    Types: Cigarettes  . Smokeless tobacco: Never Used  . Alcohol Use: 15.0 oz/week    25 Cans of beer per week     Comment: Weekly    OB History    No data available     Review of Systems  All other systems reviewed and are negative.     Allergies  Review of patient's allergies indicates no known allergies.  Home Medications   Prior to Admission medications   Medication Sig Start Date End Date Taking? Authorizing Provider  acetaminophen (TYLENOL) 500 MG tablet Take 1,000 mg by mouth every 6 (six) hours as needed for moderate pain.   Yes Historical Provider, MD  Multiple Vitamin (MULTIVITAMIN WITH MINERALS) TABS tablet Take 1 tablet by mouth daily. 01/14/15  Yes Adonis BrookSheila Agustin, NP  QUEtiapine (SEROQUEL) 300 MG tablet Take 1 tablet (300 mg total) by mouth at bedtime. 01/14/15  Yes Adonis BrookSheila Agustin, NP  QUEtiapine (SEROQUEL) 50 MG tablet Take 1 tablet (50 mg total) by mouth daily. 01/14/15  Yes  Adonis BrookSheila Agustin, NP  hydrOXYzine (ATARAX/VISTARIL) 25 MG tablet Take 1 tablet (25 mg total) by mouth every 6 (six) hours as needed for anxiety. Patient not taking: Reported on 05/19/2015 01/14/15   Adonis BrookSheila Agustin, NP  penicillin v potassium (VEETID) 500 MG tablet Take 1 tablet (500 mg total) by mouth 4 (four) times daily. Patient not taking: Reported on 05/19/2015 04/07/15   Roxy Horsemanobert Browning, PA-C   BP 138/73 mmHg  Pulse 82  Temp(Src) 97.9 F (36.6 C) (Oral)  Resp 16  SpO2 100% Physical Exam  Constitutional: She is oriented to person, place, and time. She appears well-developed and well-nourished. No distress.  HENT:  Head: Normocephalic and atraumatic.  Eyes: EOM are normal.  Neck: Normal range of motion.  Cardiovascular: Normal rate, regular rhythm and normal heart sounds.   Pulmonary/Chest: Effort normal and breath sounds normal.  Abdominal: Soft. She exhibits no distension. There is no tenderness.  Genitourinary:  Small folliculitis with induration of her right labia majora.  Needle drainage resulted in no pus.  Small amount of blood.  No surrounding erythema.  Cervix normal.  Small vaginal discharge noted.  Chaperone present.  Musculoskeletal: Normal range of motion.  Neurological: She is alert and oriented to person, place, and time.  Skin:  Skin is warm and dry.  Psychiatric: She has a normal mood and affect. Judgment normal.  Nursing note and vitals reviewed.   ED Course  Procedures (including critical care time) Labs Review Labs Reviewed  WET PREP, GENITAL - Abnormal; Notable for the following:    Trich, Wet Prep TOO NUMEROUS TO COUNT (*)    Clue Cells Wet Prep HPF POC FEW (*)    WBC, Wet Prep HPF POC FEW (*)    All other components within normal limits  RPR  HIV ANTIBODY (ROUTINE TESTING)  GC/CHLAMYDIA PROBE AMP ([place])    Imaging Review No results found.   EKG Interpretation None      MDM   Final diagnoses:  Folliculitis  Vaginal discharge   I  suspect the area on her right labia as a folliculitis.  Needle aspiration of the bedside demonstrates no frank pus.  The majority of what I feel is likely induration.  No indication for incision and drainage at this time.  Warm compresses and antibiotics.  Patient be treated for trichomonas.  She understands that is important for all sexual partners to be seen and evaluated at the health department for additional STI screening.  Patient was given a refill of her seroquel     Azalia Bilis, MD 05/20/15 [phone]

## 2015-05-20 LAB — HIV ANTIBODY (ROUTINE TESTING W REFLEX): HIV SCREEN 4TH GENERATION: NONREACTIVE

## 2015-05-20 LAB — RPR: RPR Ser Ql: NONREACTIVE

## 2015-05-20 LAB — GC/CHLAMYDIA PROBE AMP (~~LOC~~) NOT AT ARMC
Chlamydia: NEGATIVE
NEISSERIA GONORRHEA: NEGATIVE

## 2015-07-28 ENCOUNTER — Ambulatory Visit: Payer: Self-pay | Admitting: Family

## 2015-09-21 ENCOUNTER — Encounter (HOSPITAL_COMMUNITY): Payer: Self-pay

## 2015-09-21 ENCOUNTER — Emergency Department (HOSPITAL_COMMUNITY)
Admission: EM | Admit: 2015-09-21 | Discharge: 2015-09-21 | Disposition: A | Discharge status: Other Acute Inpatient Hospital | Payer: Medicare Other | Attending: Emergency Medicine | Admitting: Emergency Medicine

## 2015-09-21 ENCOUNTER — Inpatient Hospital Stay (HOSPITAL_COMMUNITY)
Admission: AD | Admit: 2015-09-21 | Discharge: 2015-09-26 | DRG: 885 | Disposition: A | Discharge status: Home / Self Care | Payer: Medicare Other | Source: Intra-hospital | Attending: Psychiatry | Admitting: Psychiatry

## 2015-09-21 DIAGNOSIS — F1210 Cannabis abuse, uncomplicated: Secondary | ICD-10-CM | POA: Diagnosis not present

## 2015-09-21 DIAGNOSIS — F319 Bipolar disorder, unspecified: Secondary | ICD-10-CM | POA: Insufficient documentation

## 2015-09-21 DIAGNOSIS — Z3202 Encounter for pregnancy test, result negative: Secondary | ICD-10-CM | POA: Insufficient documentation

## 2015-09-21 DIAGNOSIS — F3110 Bipolar disorder, current episode manic without psychotic features, unspecified: Secondary | ICD-10-CM | POA: Diagnosis present

## 2015-09-21 DIAGNOSIS — Z79899 Other long term (current) drug therapy: Secondary | ICD-10-CM | POA: Diagnosis not present

## 2015-09-21 DIAGNOSIS — Z008 Encounter for other general examination: Secondary | ICD-10-CM | POA: Diagnosis present

## 2015-09-21 DIAGNOSIS — F3010 Manic episode without psychotic symptoms, unspecified: Secondary | ICD-10-CM

## 2015-09-21 DIAGNOSIS — Z720 Tobacco use: Secondary | ICD-10-CM | POA: Insufficient documentation

## 2015-09-21 DIAGNOSIS — F17210 Nicotine dependence, cigarettes, uncomplicated: Secondary | ICD-10-CM | POA: Diagnosis present

## 2015-09-21 DIAGNOSIS — F1420 Cocaine dependence, uncomplicated: Secondary | ICD-10-CM | POA: Diagnosis present

## 2015-09-21 DIAGNOSIS — F3113 Bipolar disorder, current episode manic without psychotic features, severe: Secondary | ICD-10-CM | POA: Diagnosis present

## 2015-09-21 DIAGNOSIS — F1220 Cannabis dependence, uncomplicated: Secondary | ICD-10-CM | POA: Diagnosis present

## 2015-09-21 DIAGNOSIS — F22 Delusional disorders: Secondary | ICD-10-CM | POA: Diagnosis not present

## 2015-09-21 DIAGNOSIS — F309 Manic episode, unspecified: Secondary | ICD-10-CM | POA: Insufficient documentation

## 2015-09-21 DIAGNOSIS — F209 Schizophrenia, unspecified: Secondary | ICD-10-CM | POA: Insufficient documentation

## 2015-09-21 DIAGNOSIS — A749 Chlamydial infection, unspecified: Secondary | ICD-10-CM | POA: Diagnosis present

## 2015-09-21 DIAGNOSIS — N390 Urinary tract infection, site not specified: Secondary | ICD-10-CM | POA: Diagnosis not present

## 2015-09-21 DIAGNOSIS — Z760 Encounter for issue of repeat prescription: Secondary | ICD-10-CM | POA: Diagnosis not present

## 2015-09-21 HISTORY — DX: Schizophrenia, unspecified: F20.9

## 2015-09-21 LAB — RAPID URINE DRUG SCREEN, HOSP PERFORMED
AMPHETAMINES: NOT DETECTED
BARBITURATES: NOT DETECTED
BENZODIAZEPINES: NOT DETECTED
COCAINE: NOT DETECTED
OPIATES: NOT DETECTED
TETRAHYDROCANNABINOL: POSITIVE — AB

## 2015-09-21 LAB — COMPREHENSIVE METABOLIC PANEL
ALK PHOS: 56 U/L (ref 38–126)
ALT: 22 U/L (ref 14–54)
ANION GAP: 6 (ref 5–15)
AST: 20 U/L (ref 15–41)
Albumin: 4.2 g/dL (ref 3.5–5.0)
BUN: 9 mg/dL (ref 6–20)
CHLORIDE: 108 mmol/L (ref 101–111)
CO2: 26 mmol/L (ref 22–32)
CREATININE: 0.92 mg/dL (ref 0.44–1.00)
Calcium: 9.3 mg/dL (ref 8.9–10.3)
GFR calc Af Amer: >60 mL/min (ref >60)
GFR calc non Af Amer: >60 mL/min (ref >60)
Glucose, Bld: 74 mg/dL (ref 65–99)
Potassium: 4.1 mmol/L (ref 3.5–5.1)
SODIUM: 140 mmol/L (ref 135–145)
Total Bilirubin: 0.3 mg/dL (ref 0.3–1.2)
Total Protein: 7.4 g/dL (ref 6.5–8.1)

## 2015-09-21 LAB — URINALYSIS, ROUTINE W REFLEX MICROSCOPIC
Bilirubin Urine: NEGATIVE
GLUCOSE, UA: NEGATIVE mg/dL
HGB URINE DIPSTICK: NEGATIVE
KETONES UR: NEGATIVE mg/dL
Leukocytes, UA: NEGATIVE
NITRITE: NEGATIVE
PH: 7 (ref 5.0–8.0)
Protein, ur: NEGATIVE mg/dL
SPECIFIC GRAVITY, URINE: 1.004 — AB (ref 1.005–1.030)
Urobilinogen, UA: 0.2 mg/dL (ref 0.0–1.0)

## 2015-09-21 LAB — CBC
HEMATOCRIT: 45.1 % (ref 36.0–46.0)
HEMOGLOBIN: 14.7 g/dL (ref 12.0–15.0)
MCH: 29.7 pg (ref 26.0–34.0)
MCHC: 32.6 g/dL (ref 30.0–36.0)
MCV: 91.1 fL (ref 78.0–100.0)
Platelets: 234 10*3/uL (ref 150–400)
RBC: 4.95 MIL/uL (ref 3.87–5.11)
RDW: 14.9 % (ref 11.5–15.5)
WBC: 4.8 10*3/uL (ref 4.0–10.5)

## 2015-09-21 LAB — WET PREP, GENITAL
Clue Cells Wet Prep HPF POC: NONE SEEN
Trich, Wet Prep: NONE SEEN
Yeast Wet Prep HPF POC: NONE SEEN

## 2015-09-21 LAB — SALICYLATE LEVEL: Salicylate Lvl: <4 mg/dL (ref 2.8–30.0)

## 2015-09-21 LAB — ACETAMINOPHEN LEVEL

## 2015-09-21 LAB — PREGNANCY, URINE: Preg Test, Ur: NEGATIVE

## 2015-09-21 LAB — ETHANOL: Alcohol, Ethyl (B): 19 mg/dL — ABNORMAL HIGH (ref <5)

## 2015-09-21 MED ORDER — QUETIAPINE FUMARATE 50 MG PO TABS
50.0000 mg | ORAL_TABLET | Freq: Every day | ORAL | Status: DC
  Administered 2015-09-22 – 2015-09-26 (×5): 50 mg via ORAL
  Filled 2015-09-21 (×7): qty 1

## 2015-09-21 MED ORDER — TRAZODONE HCL 50 MG PO TABS
50.0000 mg | ORAL_TABLET | Freq: Every evening | ORAL | Status: DC | PRN
  Filled 2015-09-21 (×15): qty 1

## 2015-09-21 MED ORDER — MAGNESIUM HYDROXIDE 400 MG/5ML PO SUSP
30.0000 mL | Freq: Every day | ORAL | Status: DC | PRN
Start: 2015-09-21 — End: 2015-09-27

## 2015-09-21 MED ORDER — DIAZEPAM 2 MG PO TABS
2.0000 mg | ORAL_TABLET | Freq: Once | ORAL | Status: AC
  Administered 2015-09-21: 2 mg via ORAL
  Filled 2015-09-21: qty 1

## 2015-09-21 MED ORDER — ACETAMINOPHEN 325 MG PO TABS: 650.0000 mg | ORAL_TABLET | Freq: Four times a day (QID) | ORAL | Status: DC | PRN

## 2015-09-21 MED ORDER — IBUPROFEN 800 MG PO TABS
800.0000 mg | ORAL_TABLET | Freq: Once | ORAL | Status: AC
  Administered 2015-09-21: 800 mg via ORAL
  Filled 2015-09-21: qty 1

## 2015-09-21 MED ORDER — HYDROXYZINE HCL 50 MG PO TABS
50.0000 mg | ORAL_TABLET | Freq: Four times a day (QID) | ORAL | Status: DC | PRN
  Administered 2015-09-21 – 2015-09-24 (×2): 50 mg via ORAL
  Filled 2015-09-21 (×2): qty 1

## 2015-09-21 MED ORDER — QUETIAPINE FUMARATE 300 MG PO TABS
300.0000 mg | ORAL_TABLET | Freq: Every day | ORAL | Status: DC
  Administered 2015-09-21 – 2015-09-22 (×2): 300 mg via ORAL
  Filled 2015-09-21 (×4): qty 1

## 2015-09-21 MED ORDER — ACETAMINOPHEN 325 MG PO TABS
650.0000 mg | ORAL_TABLET | Freq: Once | ORAL | Status: AC
  Administered 2015-09-21: 650 mg via ORAL
  Filled 2015-09-21: qty 2

## 2015-09-21 MED ORDER — ALUM & MAG HYDROXIDE-SIMETH 200-200-20 MG/5ML PO SUSP
30.0000 mL | ORAL | Status: DC | PRN
Start: 2015-09-21 — End: 2015-09-27

## 2015-09-21 MED ORDER — ADULT MULTIVITAMIN W/MINERALS CH
1.0000 | ORAL_TABLET | Freq: Every day | ORAL | Status: DC
  Administered 2015-09-22 – 2015-09-26 (×5): 1 via ORAL
  Filled 2015-09-21 (×7): qty 1

## 2015-09-21 MED ORDER — ACETAMINOPHEN 500 MG PO TABS
1000.0000 mg | ORAL_TABLET | Freq: Four times a day (QID) | ORAL | Status: DC | PRN
  Administered 2015-09-21 – 2015-09-24 (×5): 1000 mg via ORAL
  Filled 2015-09-21 (×5): qty 2

## 2015-09-21 NOTE — Progress Notes (Signed)
Pt is a 54 year old female admitted with hypomanic behaviors   She reports being off of her medications for a week and has been using pot  She reports feeling paranoid and edgy   She reports poor sleep   She is cooperative during the assessment and intrusive but easily redirected   She reports she just got her section 8 housing and she wants to find a regular doctor to manage her medications so she can stay on medications and be stable    Verbal support given  Assessment completed and pt oriented to the unit   Nourishment provided   Medications administered and effectiveness monitored    Q 15 min checks   Pt is safe at present and adjusting well

## 2015-09-21 NOTE — ED Notes (Signed)
Pt alert and oriented x4. Respirations even and unlabored, bilateral symmetrical rise and fall of chest. Skin warm and dry. In no acute distress. Denies needs.   

## 2015-09-21 NOTE — ED Notes (Signed)
Pt stated she did not have any seroquel left and has been off of it for one week. Pt is very manic and requesting medication. She does contract for safety.

## 2015-09-21 NOTE — Progress Notes (Signed)
Cm spoke with pt about Cm speaking with pt previously Pt confirms she did not follow up with finding a doctor[place]  Providers near zip [phone] Pt states she will attempt to find a provider and start taking her medications as she should Pt given a lot of encouragement Her mother remains in [place]

## 2015-09-21 NOTE — Clinical Social Work Note (Signed)
CSW met with pt to have her sign voluntary consent to treat for her [facility] admission.  Pt denied signing any consent to release information but did sign the consent to treat.  Pt has a bed at [facility] 500 bed 2.  Pt may be transported to [facility] after 8pm this evening.  RN was provided the consent form for file and advised of bed.  RN attempted to call report at [facility] but was told they could not receive report until this evening.  Dede Query, LCSW [facility] Triage

## 2015-09-21 NOTE — ED Notes (Addendum)
Patient states she needs medical clearance. Patient states she is feeling paranoid, anxious and manic. Patient denies SI/HI, visual or auditory hallucinations.Patient states she has been off of her medications x 1 week because she ran out. Patient states she drank 1 beer an hour ago.

## 2015-09-21 NOTE — ED Notes (Signed)
Patient changed into [place] scrubs and wanded.
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
## 2015-09-21 NOTE — ED Provider Notes (Signed)
CSN: [id number]     Arrival date & time 09/21/15  1114 History  By signing my name below, I, Ronney Lion, attest that this documentation has been prepared under the direction and in the presence of [employer], PA-C. Electronically Signed: Ronney Lion, ED Scribe. 09/21/2015. 12:32 PM.    Chief Complaint  Patient presents with  . Medical Clearance   The history is provided by the patient. No language interpreter was used.    HPI Comments: Annajulia Lewing is a 54 y.o. female with a history of depression, Bipolar 1, schizophrenia, and drug abuse, who presents to the Emergency Department complaining of feeling manic and paranoid because she has not taken her medications (Seroquel) for 1 week. She denies SI or HI. Patient states she drank one [employer] Ice beer today, and she notes she normally drinks 1 beer/week. She also states she has not smoked a cigarette and is craving one, although she understands she cannot step out to smoke. Patient states she last smoked marijuana 1 week ago, as it helped alleviate some of her symptoms caused by missing her medications.  Patient also complains of right ear pain radiating to her head. She states she believes this happens whenever she has a UTI or STI, and she suspects she has an infection. Patient reports being sexually active with 2 partners in the last 2 months. She notes her vagina is malodorous but denies any vaginal discharge or bleeding. Patient states she is postmenopausal.   She denies any recent illness, chest pain, shortness of breath, abdominal pain, nausea, vomiting, diarrhea, dysuria, hematuria, urinary frequency.    Past Medical History  Diagnosis Date  . Depression   . Bipolar 1 disorder   . Drug abuse   . Schizophrenia    Past Surgical History  Procedure Laterality Date  . No past surgeries     Family History  Problem Relation Age of Onset  . Anesthesia problems Neg Hx   . Hypotension Neg Hx   . Malignant hyperthermia Neg Hx    . Pseudochol deficiency Neg Hx    Social History  Substance Use Topics  . Smoking status: Current Every Day Smoker -- 0.25 packs/day for 10 years    Types: Cigarettes  . Smokeless tobacco: Never Used  . Alcohol Use: 15.0 oz/week    25 Cans of beer per week     Comment: occasinally   OB History    No data available     Review of Systems  Respiratory: Negative for shortness of breath.   Cardiovascular: Negative for chest pain.  Psychiatric/Behavioral: Negative for suicidal ideas. The patient is hyperactive (manic).        Positive for paranoid  All other systems reviewed and are negative.  Allergies  Review of patient's allergies indicates no known allergies.  Home Medications   Prior to Admission medications   Medication Sig Start Date End Date Taking? Authorizing Provider  acetaminophen (TYLENOL) 500 MG tablet Take 1,000 mg by mouth every 6 (six) hours as needed for moderate pain.   Yes Historical Provider, MD  Multiple Vitamin (MULTIVITAMIN WITH MINERALS) TABS tablet Take 1 tablet by mouth daily. 01/14/15  Yes Adonis Brook, NP  QUEtiapine (SEROQUEL) 300 MG tablet Take 1 tablet (300 mg total) by mouth at bedtime. 05/19/15  Yes Azalia Bilis, MD  QUEtiapine (SEROQUEL) 50 MG tablet Take 1 tablet (50 mg total) by mouth daily. 05/19/15  Yes Azalia Bilis, MD  hydrOXYzine (ATARAX/VISTARIL) 25 MG tablet Take 1 tablet (25 mg  total) by mouth every 6 (six) hours as needed for anxiety. Patient not taking: Reported on 05/19/2015 01/14/15   Adonis Brook, NP  metroNIDAZOLE (FLAGYL) 500 MG tablet Take 1 tablet (500 mg total) by mouth 2 (two) times daily. Patient not taking: Reported on 09/21/2015 05/19/15   Azalia Bilis, MD  penicillin v potassium (VEETID) 500 MG tablet Take 1 tablet (500 mg total) by mouth 4 (four) times daily. Patient not taking: Reported on 05/19/2015 04/07/15   Roxy Horseman, PA-C   BP 128/79 mmHg  Pulse 72  Temp(Src) 98.1 F (36.7 C) (Oral)  Resp 16  Ht 5' (1.524 m)   Wt 110 lb (49.896 kg)  BMI 21.48 kg/m2  SpO2 100% Physical Exam  Constitutional: She is oriented to person, place, and time. She appears well-developed and well-nourished. No distress.  HENT:  Head: Normocephalic and atraumatic.  Eyes: Conjunctivae and EOM are normal.  Neck: Neck supple. No tracheal deviation present.  Cardiovascular: Normal rate.   Pulmonary/Chest: Effort normal and breath sounds normal. No respiratory distress. She has no wheezes.  Lungs are clear to auscultation bilaterally. No wheezing, no decreased breath sounds.   Abdominal: Soft. She exhibits no distension.  Genitourinary: Pelvic exam was performed with patient supine.   Pelvic exam: Chaperone present. Thick white non-odorous vaginal discharge. No vaginal bleeding or adnexal tenderness. Cervical os is closed.  Musculoskeletal: Normal range of motion.  Neurological: She is alert and oriented to person, place, and time.  Skin: Skin is warm and dry.  Psychiatric: She has a normal mood and affect. Her behavior is normal. She is not actively hallucinating. She expresses no homicidal and no suicidal ideation.  No SI, HI, or hallucinations.  Nursing note and vitals reviewed.   ED Course  Procedures (including critical care time)  DIAGNOSTIC STUDIES: Oxygen Saturation is 96% on RA, normal by my interpretation.    COORDINATION OF CARE: 12:27 PM - Discussed treatment plan with pt at bedside which includes STI testing and ibuprofen for pt's headache. Pt verbalized understanding and agreed to plan.   Labs Review Labs Reviewed  WET PREP, GENITAL - Abnormal; Notable for the following:    WBC, Wet Prep HPF POC FEW (*)    All other components within normal limits  ETHANOL - Abnormal; Notable for the following:    Alcohol, Ethyl (B) 19 (*)    All other components within normal limits  ACETAMINOPHEN LEVEL - Abnormal; Notable for the following:    Acetaminophen (Tylenol), Serum <10 (*)    All other components within  normal limits  URINE RAPID DRUG SCREEN, HOSP PERFORMED - Abnormal; Notable for the following:    Tetrahydrocannabinol POSITIVE (*)    All other components within normal limits  URINALYSIS, ROUTINE W REFLEX MICROSCOPIC (NOT AT [facility]) - Abnormal; Notable for the following:    Specific Gravity, Urine 1.004 (*)    All other components within normal limits  COMPREHENSIVE METABOLIC PANEL  SALICYLATE LEVEL  CBC  PREGNANCY, URINE  RPR  HIV ANTIBODY (ROUTINE TESTING)  GC/CHLAMYDIA PROBE AMP ([place]) NOT AT [facility]  GC/CHLAMYDIA PROBE AMP ([place]) NOT AT [facility]    Imaging Review No results found. I have personally reviewed and evaluated these lab results as part of my medical decision-making.   EKG Interpretation None      MDM   Final diagnoses:  Manic behavior  Encounter for medication refill  Patient presents for "manic behavior" and noncompliance with psych medication (seroquel). She is also complaining of vaginal  odor. She is well appearing and vital signs are stable. Her labs appear normal. Wet prep and UA are normal. She denies any SI, HI, hallucinations. Holding orders were placed and she was moved back to TCU.  Medications  ibuprofen (ADVIL,MOTRIN) tablet 800 mg (800 mg Oral Given 09/21/15 1257)  diazepam (VALIUM) tablet 2 mg (2 mg Oral Given 09/21/15 1328)   Filed Vitals:   09/21/15 1317  BP: 128/79  Pulse: 72  Temp:   Resp: 16    I personally performed the services described in this documentation, which was scribed in my presence. The recorded information has been reviewed and is accurate.     Catha Gosselin, PA-C 09/21/15 1626  Pricilla Loveless, MD 09/23/15 1513

## 2015-09-21 NOTE — BH Assessment (Signed)
[facility] Assessment Progress Note

## 2015-09-21 NOTE — ED Notes (Signed)
Report called to RN Penny, [facility].  Pending [facility] transport. 

## 2015-09-21 NOTE — ED Notes (Signed)
Patient entered triage, states I am going to go smoke. Patient was advised there is no smoking on [facility] property and she may not leave. Patient was advised if she leaves she will be discharged out. Patient returned to treatment room.

## 2015-09-21 NOTE — ED Notes (Signed)
Pt Belonging bags x 2 and a blue tote bag given to [employer].

## 2015-09-21 NOTE — Tx Team (Signed)
Initial Interdisciplinary Treatment Plan   PATIENT STRESSORS: Financial difficulties Medication change or noncompliance Substance abuse   PATIENT STRENGTHS: General fund of knowledge Motivation for treatment/growth Supportive family/friends   PROBLEM LIST: Problem List/Patient Goals Date to be addressed Date deferred Reason deferred Estimated date of resolution  I want to get stabalized and back on my medications                                                       DISCHARGE CRITERIA:  Improved stabilization in mood, thinking, and/or behavior Verbal commitment to aftercare and medication compliance  PRELIMINARY DISCHARGE PLAN: Attend aftercare/continuing care group Return to previous living arrangement  PATIENT/FAMIILY INVOLVEMENT: This treatment plan has been presented to and reviewed with the patient, [name], and/or family member, .  The patient and family have been given the opportunity to ask questions and make suggestions.  Andrena Mews 09/21/2015, 10:21 PM

## 2015-09-21 NOTE — ED Notes (Signed)
Pt oriented to room and unit.  Denies SI, HI and AVH.  Video monitoring and 15 minute checks in place.

## 2015-09-22 ENCOUNTER — Encounter (HOSPITAL_COMMUNITY): Payer: Self-pay | Admitting: Psychiatry

## 2015-09-22 DIAGNOSIS — F1420 Cocaine dependence, uncomplicated: Secondary | ICD-10-CM | POA: Diagnosis present

## 2015-09-22 DIAGNOSIS — F1220 Cannabis dependence, uncomplicated: Secondary | ICD-10-CM | POA: Diagnosis present

## 2015-09-22 DIAGNOSIS — F3113 Bipolar disorder, current episode manic without psychotic features, severe: Secondary | ICD-10-CM | POA: Diagnosis present

## 2015-09-22 LAB — HIV ANTIBODY (ROUTINE TESTING W REFLEX): HIV Screen 4th Generation wRfx: NONREACTIVE

## 2015-09-22 LAB — RPR: RPR: NONREACTIVE

## 2015-09-22 LAB — GC/CHLAMYDIA PROBE AMP (~~LOC~~) NOT AT ARMC
CHLAMYDIA, DNA PROBE: NEGATIVE
Neisseria Gonorrhea: NEGATIVE

## 2015-09-22 MED ORDER — NICOTINE POLACRILEX 2 MG MT GUM: 2.0000 mg | CHEWING_GUM | OROMUCOSAL | Status: DC | PRN

## 2015-09-22 MED ORDER — FLUCONAZOLE 150 MG PO TABS
150.0000 mg | ORAL_TABLET | Freq: Once | ORAL | Status: AC
  Administered 2015-09-22: 150 mg via ORAL
  Filled 2015-09-22: qty 1

## 2015-09-22 NOTE — [facility] Suicide Risk Assessment (Signed)
[facility] Admission Suicide Risk Assessment   Nursing information obtained from:    Demographic factors:    Current Mental Status:    Loss Factors:    Historical Factors:    Risk Reduction Factors:    Total Time spent with patient: 30 minutes Principal Problem: Bipolar disorder, current episode manic without psychotic features, severe Diagnosis:   Patient Active Problem List   Diagnosis Date Noted  . Cannabis use disorder, moderate, dependence [F12.20] 09/22/2015  . Cocaine use disorder, moderate, dependence [F14.20] 09/22/2015  . Bipolar disorder, current episode manic without psychotic features, severe [F31.13] 09/22/2015  . Suicidal ideation [R45.851]   . Homicidal ideation [R45.850]   . Hyperglycemia [R73.9] 09/04/2012  . UTI (lower urinary tract infection) [N39.0] 09/04/2012  . Hallucination [R44.3] 08/31/2012  . Psychosis [F29] 11/16/2011    Class: Acute  . Polysubstance abuse [F19.10] 11/14/2011     Continued Clinical Symptoms:  Alcohol Use Disorder Identification Test Final Score (AUDIT): 2 The "Alcohol Use Disorders Identification Test", Guidelines for Use in Primary Care, Second Edition.  World Science writer [facility]). Score between 0-7:  no or low risk or alcohol related problems. Score between 8-15:  moderate risk of alcohol related problems. Score between 16-19:  high risk of alcohol related problems. Score 20 or above:  warrants further diagnostic evaluation for alcohol dependence and treatment.   CLINICAL FACTORS:   Bipolar Disorder:  MANIC   Musculoskeletal: Strength & Muscle Tone: within normal limits Gait & Station: normal Patient leans: N/A  Psychiatric Specialty Exam: Physical Exam  Review of Systems  Psychiatric/Behavioral: The patient is nervous/anxious and has insomnia.   All other systems reviewed and are negative.   Blood pressure 134/63, pulse 85, temperature 98.2 F (36.8 C), temperature source Oral, resp. rate 15, height 5' (1.524 m),  weight 56.246 kg (124 lb).Body mass index is 24.22 kg/(m^2).  General Appearance: Disheveled  Eye Contact::  Minimal  Speech:  Pressured  Volume:  Increased  Mood:  Anxious and Irritable  Affect:  Labile  Thought Process:  Loose  Orientation:  Full (Time, Place, and Person)  Thought Content:  Paranoid Ideation and Rumination  Suicidal Thoughts:  No  Homicidal Thoughts:  No  Memory:  Immediate;   Fair Recent;   Fair Remote;   Fair  Judgement:  Impaired  Insight:  Shallow  Psychomotor Activity:  Increased and Restlessness  Concentration:  Poor  Recall:  [employer] of Knowledge:Fair  Language: Fair  Akathisia:  No    AIMS (if indicated):     Assets:  Communication Skills Desire for Improvement  Sleep:  Number of Hours: 6.75  Cognition: WNL  ADL's:  Intact     COGNITIVE FEATURES THAT CONTRIBUTE TO RISK:  Polarized thinking and Thought constriction (tunnel vision)    SUICIDE RISK:   Minimal: No identifiable suicidal ideation.  Patients presenting with no risk factors but with morbid ruminations; may be classified as minimal risk based on the severity of the depressive symptoms  PLAN OF CARE:Patient will benefit from inpatient treatment and stabilization.  Estimated length of stay is 5-7 days.  Reviewed past medical records,treatment plan.  Will restart Seroquel for her mood lability - her home dose- since she was not compliant on her medication. Will start Trazodone 50 mg po qhs prn for sleep.  Will continue to monitor vitals ,medication compliance and treatment side effects while patient is here.  Will monitor for medical issues as well as call consult as needed.  Reviewed labs- CBC,  CMP - wnl , HIV/RPR- non reactive , UDS - pos for THC , Urine pregnancy - neg ,will order lipid panel,hba1c, tsh, as well as GC/CH (since it was not processed in ED). CSW will start working on disposition.  Patient to participate in therapeutic milieu .       Medical Decision Making:   New problem, with additional work up planned, Review of Psycho-Social Stressors (1), Review or order clinical lab tests (1), Review and summation of old records (2), Established Problem, Worsening (2), Review of Last Therapy Session (1), Review of Medication Regimen & Side Effects (2) and Review of New Medication or Change in Dosage (2)  I certify that inpatient services furnished can reasonably be expected to improve the patient's condition.   Eappen,Saramma md 09/22/2015, 12:13 PM

## 2015-09-22 NOTE — Tx Team (Signed)
Interdisciplinary Treatment Plan Update (Adult)  Date:  09/22/2015 Time Reviewed:  3:47 PM  Progress in Treatment: Attending groups: Yes. Participating in groups: Yes. Taking medication as prescribed:  Yes. Tolerating medication:  Yes. Family/Significant other contact made:  No, will contact:  contact will not be made. Pt refused. Patient understands diagnosis:  Yes, as evidenced by seeking help with Discussing patient identified problems/goals with staff:  Yes, see initial care plan. Medical problems stabilized or resolved:  Yes Denies suicidal/homicidal ideation: Yes. Issues/concerns per patient self-inventory: No. Other:   Discharge Plan or Barriers:  See below  Discharge plan currently unknown  Reason for Continuation of Hospitalization: Hallucinations Mania Withdrawal symptoms  Comments: [name] is a 54 y.o. female with a history of depression, Bipolar 1, schizophrenia, and drug abuse, who presents to the Emergency Department complaining of feeling manic and paranoid because she has not taken her medications (Seroquel) for 1 week. She denies SI or HI. Patient states she drank one [employer] beer today, and she notes she normally drinks 1 beer/week. She also states she has not smoked a cigarette and is craving one, although she understands she cannot step out to smoke. Patient states she last smoked marijuana 1 week ago, as it helped alleviate some of her symptoms caused by missing her medications.Seroquel, Desyrel  Estimated length of stay: 4-5 days  New goal(s):  Review of initial/current patient goals per problem list:  1. Goal(s): Patient will participate in aftercare plan  Met:Yes  Target date: at discharge  As evidenced by: Patient will participate within aftercare plan AEB aftercare provider and housing plan at discharge being identified.  Pt plans to return home, follow up outpt.  Goal met.  R [place] LCSW 09/23/2015   4. Goal(s): Patient will  demonstrate decreased signs of psychosis.  Met: No  Target date:at discharge  As evidenced by: Patient will demonstrate decreased signs of psychosis as evidenced by a reduction in AVH, paranoia, and/or delusions.   09/22/2015: Pt continues to complain about hearing voices.   5. Goal(s): Patient will demonstrate decreased signs of withdrawal due to substance abuse   Met: No   Target date: 3-5 days post admission date   As evidenced by: Patient will produce a CIWA/COWS score of 0, have stable vitals signs, and no symptoms of withdrawal  09/22/2015 Pt appears to be in a "post-cocaine funk" as evidenced by laying in bed and being very lethargic.      Attendees: Patient:  09/22/2015 3:47 PM  Family:   09/22/2015 3:47 PM  Physician:  Dr. Ursula Alert, MD 09/22/2015 3:47 PM  Nursing:  Festus Aloe, RN 09/22/2015 3:47 PM  Case Manager:  Roque Lias, LCSW 09/22/2015 3:47 PM  Counselor:  Matthew Saras, MSW Intern 09/22/2015 3:47 PM  Other:   09/22/2015 3:47 PM  Other:   09/22/2015 3:47 PM  Other:   09/22/2015 3:47 PM  Other:  09/22/2015 3:47 PM  Other:    Other:    Other:    Other:    Other:    Other:      Scribe for Treatment Team:   Georga Kaufmann, MSW Intern 09/22/2015 3:47 PM

## 2015-09-22 NOTE — Progress Notes (Signed)
Nursing Note: [id number]  D:  Mood is anxious, affect is anxious and hyperactive at times.  Pt slept intermittently today stating," My first day here, I really do need to sleep."  Pt did not attend groups today.  A:  Encouraged to verbalize needs and concerns, active listening and support provided.  Continued Q 15 minute safety checks.  12 lead EKG obtained and placed onto chart. Urine sent for Chlamydia testing per pts request, result is pending at this time..  Pt taking meds as prescribed.  R:  Pt. denies AV hallucinations, SI/HI and is able to verbally contract for safety.

## 2015-09-22 NOTE — H&P (Signed)
Psychiatric Admission Assessment Adult  Patient Identification:  [name]  Date of Evaluation:  09/22/2015  Chief Complaint:  BIPOLAR 1 CURRENT EPISODE MANIC,MODERATE WITH ANXIOUS DISTRESS CANNIBIS USE DISORDER,MILD COCAINE USE DISORDER, MILD  History of Present Illness: This is yet, another admission assessment for [name], a 54 year old African-American female. [name] has been a patient in this hospital x numerous times, all related to mood instability & or polysubstance dependence. This time around, she reports, "I was off of my medicines x 1 week, became manic that lasted for 4 days. I also was feeling paranoid for about 5 days, unable to sleep for 1 week. My daughter got concerned & took me to the ED. I am still feeling manic, but not as bad. I'm hearing no voices or seeing things. I'm not suicidal or homicidal. I just need to get back on my medicines; Seroquel 50 mg bid & 300 mg Q hs respectively for agitation/mood control"  Elements:  Location:  Bipolar 1 disorder, manic. Quality:  Restlessness, high anxiety, lability, insomnia, . Severity:  Severe. Timing:  Symptoms current. Duration:  7 days since the onset of symptoms. Context:  "Was off of mental health medicines x 7 days, became manic, went to the ED".  Associated Signs/Synptoms:  Depression Symptoms:  depressed mood, insomnia, psychomotor agitation, difficulty concentrating, anxiety,  (Hypo) Manic Symptoms:  Distractibility, Elevated Mood, Labiality of Mood,  Anxiety Symptoms:  Excessive Worry, restless mind  Psychotic Symptoms:  Denies  PTSD Symptoms: NA  Total Time spent with patient: 1 hour  Psychiatric Specialty Exam: Physical Exam  Constitutional: She is oriented to person, place, and time. She appears well-developed.  HENT:  Head: Normocephalic.  Eyes: Pupils are equal, round, and reactive to light.  Neck: Normal range of motion.  Cardiovascular: Normal rate.   Respiratory: Effort  normal.  GI: Soft.  Genitourinary:  Denies any issues in this areas  Musculoskeletal: Normal range of motion.  Neurological: She is alert and oriented to person, place, and time.  Skin: Skin is warm and dry.  Psychiatric: Thought content normal. Her mood appears anxious. Her affect is labile. Her speech is rapid and/or pressured. She is hyperactive. Cognition and memory are normal. She expresses impulsivity. She exhibits a depressed mood.    Review of Systems  Constitutional: Negative.   HENT: Negative.   Eyes: Negative.   Respiratory: Negative.   Cardiovascular: Negative.   Gastrointestinal: Negative.   Genitourinary: Negative.   Musculoskeletal: Negative.   Skin: Negative.   Neurological: Negative.   Endo/Heme/Allergies: Negative.   Psychiatric/Behavioral: Positive for depression and substance abuse (Hx, Cocaine & THC dependence ). Negative for suicidal ideas, hallucinations and memory loss. The patient is nervous/anxious and has insomnia.     Blood pressure 134/63, pulse 85, temperature 98.2 F (36.8 C), temperature source Oral, resp. rate 15, height 5' (1.524 m), weight 56.246 kg (124 lb).Body mass index is 24.22 kg/(m^2).  General Appearance: Casual, Fairly Groomed and restless, wears a wig  Eye Contact::  Fair  Speech:  Pressured  Volume:  Increased  Mood:  Anxious, Depressed and Euphoric  Affect:  Labile  Thought Process:  Coherent and Goal Directed  Orientation:  Full (Time, Place, and Person)  Thought Content:  Rumination and denies any hallucinations  Suicidal Thoughts:  No  Homicidal Thoughts:  No  Memory:  Immediate;   Good Recent;   Good Remote;   Good  Judgement:  Fair  Insight:  Present  Psychomotor Activity:  "I was feeling  manic"  Concentration:  Poor  Recall:  [name]: Fair  Akathisia:  No  Handed:  Right  AIMS (if indicated):     Assets:  Communication Skills Desire for Improvement  Sleep:  Number of Hours: 6.75    Musculoskeletal: Strength & Muscle Tone: within normal limits Gait & Station: normal Patient leans: N/A  Past Psychiatric History: Diagnosis: Bipolar affective disorder  Hospitalizations: [facility] x numerous times  Outpatient Care: [facility]  Substance Abuse Care: None reported  Self-Mutilation: Patient denies  Suicidal Attempts: Patient denies  Violent Behaviors: Patient denies   Past Medical History:   Past Medical History  Diagnosis Date  . Depression   . Bipolar 1 disorder   . Drug abuse   . Schizophrenia    None.  Allergies:  No Known Allergies  PTA Medications: Prescriptions prior to admission  Medication Sig Dispense Refill Last Dose  . acetaminophen (TYLENOL) 500 MG tablet Take 1,000 mg by mouth every 6 (six) hours as needed for moderate pain.   09/21/2015 at Unknown time  . hydrOXYzine (ATARAX/VISTARIL) 25 MG tablet Take 1 tablet (25 mg total) by mouth every 6 (six) hours as needed for anxiety. (Patient not taking: Reported on 05/19/2015) 30 tablet 0 Not Taking at Unknown time  . metroNIDAZOLE (FLAGYL) 500 MG tablet Take 1 tablet (500 mg total) by mouth 2 (two) times daily. (Patient not taking: Reported on 09/21/2015) 14 tablet 0 Not Taking at Unknown time  . Multiple Vitamin (MULTIVITAMIN WITH MINERALS) TABS tablet Take 1 tablet by mouth daily. 30 tablet 0 Past Week at Unknown time  . penicillin v potassium (VEETID) 500 MG tablet Take 1 tablet (500 mg total) by mouth 4 (four) times daily. (Patient not taking: Reported on 05/19/2015) 40 tablet 0 Not Taking at Unknown time  . QUEtiapine (SEROQUEL) 300 MG tablet Take 1 tablet (300 mg total) by mouth at bedtime. 30 tablet 0 Past Week at Unknown time  . QUEtiapine (SEROQUEL) 50 MG tablet Take 1 tablet (50 mg total) by mouth daily. 30 tablet 0 Past Week at Unknown time   Previous Psychotropic Medications: Yes Medication/Dose  Seroquel 50 mg daily, Seroquel 400 mg Qhs               Substance Abuse History in the last 12  months:  Yes.    Consequences of Substance Abuse: Medical Consequences:  Liver damage, Possible death by overdose Legal Consequences:  Arrests, jail time, Loss of driving privilege. Family Consequences:  Family discord, divorce and or separation.  Social History:  reports that she has been smoking Cigarettes.  She has a 2.5 pack-year smoking history. She has never used smokeless tobacco. She reports that she drinks about 15.0 oz of alcohol per week. She reports that she uses illicit drugs ("Crack" cocaine and Marijuana).  Additional Social History: Current Place of Residence: [place], [place] City of Birth: [place], [place]    Family Members: None reported  Marital Status:  Single  Children: 2  Sons: 1  Daughters: 1  Relationships: Single  Education:  [employer] Charity fundraiser Problems/Performance: Completed high school  Religious Beliefs/Practices: NA  History of Abuse (Emotional/Phsycial/Sexual): Denies  Occupational Experiences: Disabled  Military History:  None.  Legal History: Denies  Hobbies/Interests: Denies  Family History:   Family History  Problem Relation Age of Onset  . Anesthesia problems Neg Hx   . Hypotension Neg Hx   . Malignant hyperthermia Neg Hx   . Pseudochol deficiency  Neg Hx     Results for orders placed or performed during the hospital encounter of 09/21/15 (from the past 72 hour(s))  GC/Chlamydia probe amp ([place])not at [facility]     Status: None   Collection Time: 09/21/15 12:00 AM  Result Value Ref Range   Chlamydia Negative     Comment: Normal Reference Range - Negative   Neisseria gonorrhea Negative     Comment: Normal Reference Range - Negative  Comprehensive metabolic panel     Status: None   Collection Time: 09/21/15 11:59 AM  Result Value Ref Range   Sodium 140 135 - 145 mmol/L   Potassium 4.1 3.5 - 5.1 mmol/L   Chloride 108 101 - 111 mmol/L   CO2 26 22 - 32 mmol/L   Glucose, Bld 74 65 - 99 mg/dL   BUN 9 6 - 20 mg/dL    Creatinine, Ser 0.92 0.44 - 1.00 mg/dL   Calcium 9.3 8.9 - 10.3 mg/dL   Total Protein 7.4 6.5 - 8.1 g/dL   Albumin 4.2 3.5 - 5.0 g/dL   AST 20 15 - 41 U/L   ALT 22 14 - 54 U/L   Alkaline Phosphatase 56 38 - 126 U/L   Total Bilirubin 0.3 0.3 - 1.2 mg/dL   GFR calc non Af Amer >60 >60 mL/min   GFR calc Af Amer >60 >60 mL/min    Comment: (NOTE) The eGFR has been calculated using the CKD EPI equation. This calculation has not been validated in all clinical situations. eGFR's persistently <60 mL/min signify possible Chronic Kidney Disease.    Anion gap 6 5 - 15  Ethanol (ETOH)     Status: Abnormal   Collection Time: 09/21/15 11:59 AM  Result Value Ref Range   Alcohol, Ethyl (B) 19 (H) <5 mg/dL    Comment:        LOWEST DETECTABLE LIMIT FOR SERUM ALCOHOL IS 5 mg/dL FOR MEDICAL PURPOSES ONLY   Salicylate level     Status: None   Collection Time: 09/21/15 11:59 AM  Result Value Ref Range   Salicylate Lvl <1.2 2.8 - 30.0 mg/dL  Acetaminophen level     Status: Abnormal   Collection Time: 09/21/15 11:59 AM  Result Value Ref Range   Acetaminophen (Tylenol), Serum <10 (L) 10 - 30 ug/mL    Comment:        THERAPEUTIC CONCENTRATIONS VARY SIGNIFICANTLY. A RANGE OF 10-30 ug/mL MAY BE AN EFFECTIVE CONCENTRATION FOR MANY PATIENTS. HOWEVER, SOME ARE BEST TREATED AT CONCENTRATIONS OUTSIDE THIS RANGE. ACETAMINOPHEN CONCENTRATIONS >150 ug/mL AT 4 HOURS AFTER INGESTION AND >50 ug/mL AT 12 HOURS AFTER INGESTION ARE OFTEN ASSOCIATED WITH TOXIC REACTIONS.   CBC     Status: None   Collection Time: 09/21/15 11:59 AM  Result Value Ref Range   WBC 4.8 4.0 - 10.5 K/uL   RBC 4.95 3.87 - 5.11 MIL/uL   Hemoglobin 14.7 12.0 - 15.0 g/dL   HCT 45.1 36.0 - 46.0 %   MCV 91.1 78.0 - 100.0 fL   MCH 29.7 26.0 - 34.0 pg   MCHC 32.6 30.0 - 36.0 g/dL   RDW 14.9 11.5 - 15.5 %   Platelets 234 150 - 400 K/uL  Urine rapid drug screen (hosp performed) (Not at [facility])     Status: Abnormal   Collection Time:  09/21/15 12:03 PM  Result Value Ref Range   Opiates NONE DETECTED NONE DETECTED   Cocaine NONE DETECTED NONE DETECTED   Benzodiazepines NONE DETECTED NONE  DETECTED   Amphetamines NONE DETECTED NONE DETECTED   Tetrahydrocannabinol POSITIVE (A) NONE DETECTED   Barbiturates NONE DETECTED NONE DETECTED    Comment:        DRUG SCREEN FOR MEDICAL PURPOSES ONLY.  IF CONFIRMATION IS NEEDED FOR ANY PURPOSE, NOTIFY LAB WITHIN 5 DAYS.        LOWEST DETECTABLE LIMITS FOR URINE DRUG SCREEN Drug Class       Cutoff (ng/mL) Amphetamine      1000 Barbiturate      200 Benzodiazepine   748 Tricyclics       270 Opiates          300 Cocaine          300 THC              50   RPR     Status: None   Collection Time: 09/21/15 12:50 PM  Result Value Ref Range   RPR Ser Ql Non Reactive Non Reactive    Comment: (NOTE) Performed At: [facility] [address] [place], [place] [id number] Lindon Romp MD [id number]   HIV antibody     Status: None   Collection Time: 09/21/15 12:50 PM  Result Value Ref Range   HIV Screen 4th Generation wRfx Non Reactive Non Reactive    Comment: (NOTE) Performed At: [facility] [place], [place] [id number] Lindon Romp MD [id number]   Urinalysis, Routine w reflex microscopic (not at [facility])     Status: Abnormal   Collection Time: 09/21/15  1:03 PM  Result Value Ref Range   Color, Urine YELLOW YELLOW   APPearance CLEAR CLEAR   Specific Gravity, Urine 1.004 (L) 1.005 - 1.030   pH 7.0 5.0 - 8.0   Glucose, UA NEGATIVE NEGATIVE mg/dL   Hgb urine dipstick NEGATIVE NEGATIVE   Bilirubin Urine NEGATIVE NEGATIVE   Ketones, ur NEGATIVE NEGATIVE mg/dL   Protein, ur NEGATIVE NEGATIVE mg/dL   Urobilinogen, UA 0.2 0.0 - 1.0 mg/dL   Nitrite NEGATIVE NEGATIVE   Leukocytes, UA NEGATIVE NEGATIVE    Comment: MICROSCOPIC NOT DONE ON URINES WITH NEGATIVE PROTEIN, BLOOD, LEUKOCYTES, NITRITE, OR GLUCOSE <1000 mg/dL.  Pregnancy, urine      Status: None   Collection Time: 09/21/15  1:03 PM  Result Value Ref Range   Preg Test, Ur NEGATIVE NEGATIVE    Comment:        THE SENSITIVITY OF THIS METHODOLOGY IS >20 mIU/mL.   Wet prep, genital     Status: Abnormal   Collection Time: 09/21/15  1:13 PM  Result Value Ref Range   Yeast Wet Prep HPF POC NONE SEEN NONE SEEN   Trich, Wet Prep NONE SEEN NONE SEEN   Clue Cells Wet Prep HPF POC NONE SEEN NONE SEEN   WBC, Wet Prep HPF POC FEW (A) NONE SEEN   Psychological Evaluations: Yes  Assessment:   DSM5: Schizophrenia Disorders:  NA Obsessive-Compulsive Disorders:  NA Trauma-Stressor Disorders:  NA Substance/Addictive Disorders:  Cannabis Use Disorder - Severe (304.30), Cocaine dependence Depressive Disorders:  Bipolar 1 disorder, manic  AXIS III:   Past Medical History  Diagnosis Date  . Depression   . Bipolar 1 disorder   . Drug abuse   . Schizophrenia    Treatment Plan/Recommendations: 1. Admit for crisis management and stabilization, estimated length of stay 3-5 days.  2. Medication management to reduce current symptoms to base line and improve the patient's overall level of functioning, Initiate Seroqel 50 mg  daily & 200 mg Q bedtime for mood control, discontinue Trazodone-not compatible with seroquel 3. Treat health problems as indicated.  4. Develop treatment plan to decrease risk of relapse upon discharge and the need for readmission.  5. Psycho-social education regarding relapse prevention and self care.  6. Health care follow up as needed for medical problems.  7. Review, reconcile, and reinstate any pertinent home medications for other health issues where appropriate. 8. Call for consults with hospitalist for any additional specialty patient care services as needed.  Medical Decision Making: Review of Psycho-Social Stressors (1), Review or order clinical lab tests (1), Established Problem, Worsening (2), Review of Medication Regimen & Side Effects (2) and Review  of New Medication or Change in Dosage (2)  Current Medications:  Current Facility-Administered Medications  Medication Dose Route Frequency Provider Last Rate Last Dose  . acetaminophen (TYLENOL) tablet 1,000 mg  1,000 mg Oral Q6H PRN Laverle Hobby, PA-C   1,000 mg at 09/21/15 2158  . alum & mag hydroxide-simeth (MAALOX/MYLANTA) 200-200-20 MG/5ML suspension 30 mL  30 mL Oral Q4H PRN Laverle Hobby, PA-C      . fluconazole (DIFLUCAN) tablet 150 mg  150 mg Oral Once Ursula Alert, MD      . hydrOXYzine (ATARAX/VISTARIL) tablet 50 mg  50 mg Oral Q6H PRN Laverle Hobby, PA-C   50 mg at 09/21/15 2158  . magnesium hydroxide (MILK OF MAGNESIA) suspension 30 mL  30 mL Oral Daily PRN Laverle Hobby, PA-C      . multivitamin with minerals tablet 1 tablet  1 tablet Oral Daily Laverle Hobby, PA-C   1 tablet at 09/22/15 0845  . nicotine polacrilex (NICORETTE) gum 2 mg  2 mg Oral PRN Ursula Alert, MD      . QUEtiapine (SEROQUEL) tablet 300 mg  300 mg Oral QHS Laverle Hobby, PA-C   300 mg at 09/21/15 2158  . QUEtiapine (SEROQUEL) tablet 50 mg  50 mg Oral Daily Laverle Hobby, PA-C   50 mg at 09/22/15 0845  . traZODone (DESYREL) tablet 50 mg  50 mg Oral QHS,MR X 1 Spencer E Simon, PA-C   50 mg at 09/21/15 2200    Observation Level/Precautions:  15 minute checks  Laboratory:  Per ED  Psychotherapy:  Group sessions  Medications: See medication lists   Consultations: As needed    Discharge Concerns:  Mood stability, maintaining sobriety  Estimated LOS: 5-7 days  Other:     I certify that inpatient services furnished can reasonably be expected to improve the patient's condition.   Encarnacion Slates, PMHNP, FNP-BC 9/28/201611:38 AM

## 2015-09-22 NOTE — Progress Notes (Signed)
D   Pt has been in bed asleep most of the shift   She was pleasant when awakened for her medications but quickly went back to sleep    A   Verbal support given   Medications administered and effectiveness monitored    Q 15 min checks R   Pt safe at present

## 2015-09-22 NOTE — Progress Notes (Signed)
Adult Psychoeducational Group Note  Date:  09/22/2015 Time:  8:25 PM  Group Topic/Focus:  Wrap-Up Group:   The focus of this group is to help patients review their daily goal of treatment and discuss progress on daily workbooks.  Participation Level:  Did Not Attend  Participation Quality:  N/A  Affect:  N/A  Cognitive:  N/A  Insight: None  Engagement in Group:  N/A  Modes of Intervention:  N/A  Additional Comments:  PT did not attend wrap-up group because pt was asleep.    Cleotilde Neer 09/22/2015, 10:17 PM

## 2015-09-22 NOTE — [facility] Group Notes (Signed)
[facility] LCSW Group Therapy  09/22/2015 3:43 PM  Type of Therapy: Group Therapy  Participation Level: [name]. Chose not to attend.  Summary of Progress/Problems: Onalee Hua from [employer] was here to tell his story of recovery and play his guitar.  Vito Backers. Beverely Pace 09/22/2015 3:43 PM

## 2015-09-22 NOTE — Progress Notes (Signed)
Patient ID: [name], female   DOB: 11-12-1961, 54 y.o.   MRN: [id number] PER STATE REGULATIONS 482.30  THIS CHART WAS REVIEWED FOR MEDICAL NECESSITY WITH RESPECT TO THE PATIENT'S ADMISSION/ DURATION OF STAY.  NEXT REVIEW DATE: 09/25/15  Willa Rough, RN, BSN CASE MANAGER

## 2015-09-22 NOTE — [facility] Group Notes (Signed)
[facility] LCSW Aftercare Discharge Planning Group Note   09/22/2015 11:46 AM  Participation Quality:  [name].  Chose to not attend.    [name] B
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
## 2015-09-23 LAB — LIPID PANEL
CHOL/HDL RATIO: 2.1 ratio
Cholesterol: 182 mg/dL (ref 0–200)
HDL: 88 mg/dL (ref >40)
LDL CALC: 75 mg/dL (ref 0–99)
Triglycerides: 94 mg/dL (ref <150)
VLDL: 19 mg/dL (ref 0–40)

## 2015-09-23 LAB — GC/CHLAMYDIA PROBE AMP (~~LOC~~) NOT AT ARMC
Chlamydia: POSITIVE — AB
Neisseria Gonorrhea: NEGATIVE

## 2015-09-23 LAB — TSH: TSH: 1.018 u[IU]/mL (ref 0.350–4.500)

## 2015-09-23 MED ORDER — ENSURE ENLIVE PO LIQD
237.0000 mL | Freq: Two times a day (BID) | ORAL | Status: DC
  Administered 2015-09-23 – 2015-09-26 (×4): 237 mL via ORAL

## 2015-09-23 MED ORDER — AMOXICILLIN-POT CLAVULANATE 875-125 MG PO TABS
1.0000 | ORAL_TABLET | Freq: Two times a day (BID) | ORAL | Status: DC
  Administered 2015-09-23 – 2015-09-24 (×2): 1 via ORAL
  Filled 2015-09-23 (×4): qty 1

## 2015-09-23 MED ORDER — QUETIAPINE FUMARATE 400 MG PO TABS
400.0000 mg | ORAL_TABLET | Freq: Every day | ORAL | Status: DC
  Administered 2015-09-23 – 2015-09-25 (×3): 400 mg via ORAL
  Filled 2015-09-23 (×5): qty 1

## 2015-09-23 NOTE — [facility] Suicide Risk Assessment (Signed)
[facility] INPATIENT:  Family/Significant Other Suicide Prevention Education  Suicide Prevention Education:  Patient Refusal for Family/Significant Other Suicide Prevention Education: The patient [name] has refused to provide written consent for family/significant other to be provided Family/Significant Other Suicide Prevention Education during admission and/or prior to discharge.  Physician notified.  Daryel Gerald B 09/23/2015, 3:32 PM

## 2015-09-23 NOTE — Progress Notes (Signed)
Patient ID: [name], female   DOB: 1961/11/12, 54 y.o.   MRN: [id number]  Adult Psychoeducational Group Note  Date:  09/23/2015 Time: 09:35  Group Topic/Focus:  Building Self Esteem:   The Focus of this group is helping patients become aware of the effects of self-esteem on their lives, the things they and others do that enhance or undermine their self-esteem, seeing the relationship between their level of self-esteem and the choices they make and learning ways to enhance self-esteem.  Participation Level:  Did Not Attend  Participation Quality: n/a  Affect: n/a  Cognitive: n/a  Insight: n/a  Engagement in Group: n/a  Modes of Intervention:  Activity, Education and Orientation  Additional Comments:  Pt did not attend group. Pt in bed asleep.   Aurora Mask 09/23/2015, 11:44 AM

## 2015-09-23 NOTE — Progress Notes (Signed)
The patient refused to attend group this evening.  °

## 2015-09-23 NOTE — Progress Notes (Signed)
Patient ID: [name], female   DOB: 04-18-1961, 54 y.o.   MRN: [id number]  Pt currently presents with a flat affect and cooperative but withdrawn behavior. Per self inventory, pt rates depression at a 5, hopelessness 5 and anxiety 9. Pt's daily goal is "staying on meds" and they intend to do so by "stick to plan when I leave." Pt reports fair sleep, a good appetite, high energy and poor concentration. Pt remains in bed for most of the morning, states to writer "Its me getting used to the Seroquel, once a couple days has passed I will not be as tired."  Pt provided with medications per providers orders. Pt's labs and vitals were monitored throughout the day. Pt supported emotionally and encouraged to express concerns and questions. Pt educated on medications.  Pt's safety ensured with 15 minute and environmental checks. Pt currently denies SI/HI and A/V hallucinations. Pt verbally agrees to seek staff if SI/HI or A/VH occurs and to consult with staff before acting on these thoughts. Will continue POC.

## 2015-09-23 NOTE — Progress Notes (Signed)
[facility] MD Progress Note  09/23/2015 11:14 AM [name]  MRN:  [id number] Subjective: Patient states " I am not sure why my blood is being drawn several times. I have a ear ache and I always feel that way when I have an STD . I want to know the result of my G[facility]lamydia test. They took it yesterday. I do not want to keep taking pain medications , unless you treat me for whatever infection I have down there , this will not resolve.'  Objective:This is yet, another admission assessment for marvene, a 54 year old African-American female. [name] has been a patient in this hospital x numerous times, all related to mood instability & or polysubstance dependence. This admission , as per initial notes in EHR is 2/2 noncompliance on her medications.  Patient seen and chart reviewed.Discussed patient with treatment team.  Pt continues to be manic, pressured as well as delusional. Pt feels her ear ache is connected to her having an STD . Pt was tested for HIV/RPR/Trichomoniasis - all came back wnl. Awaiting GC/Chlamydia result. Pt continues to present as hyperactive , anxious . Will continue to support. Per nursing - no disruptive issues noted on the unit.    Principal Problem: Bipolar disorder, current episode manic without psychotic features, severe Diagnosis:   Patient Active Problem List   Diagnosis Date Noted  . Cannabis use disorder, moderate, dependence [F12.20] 09/22/2015  . Cocaine use disorder, moderate, dependence [F14.20] 09/22/2015  . Bipolar disorder, current episode manic without psychotic features, severe [F31.13] 09/22/2015  . Suicidal ideation [R45.851]   . Homicidal ideation [R45.850]   . Hyperglycemia [R73.9] 09/04/2012  . UTI (lower urinary tract infection) [N39.0] 09/04/2012  . Hallucination [R44.3] 08/31/2012  . Psychosis [F29] 11/16/2011    Class: Acute  . Polysubstance abuse [F19.10] 11/14/2011   Total Time spent with patient: 25 minutes  Past Psychiatric History:  Patient with multiple admission to inpatient unit at [facility].   Past Medical History:  Past Medical History  Diagnosis Date  . Depression   . Bipolar 1 disorder   . Drug abuse   . Schizophrenia     Past Surgical History  Procedure Laterality Date  . No past surgeries     Family History:  Family History  Problem Relation Age of Onset  . Anesthesia problems Neg Hx   . Hypotension Neg Hx   . Malignant hyperthermia Neg Hx   . Pseudochol deficiency Neg Hx   . Mental illness Maternal Aunt    Family Psychiatric  History: Patient reports that her daughter also has a diagnosis of Bipolar disorder.   Social History:  History  Alcohol Use  . 15.0 oz/week  . 25 Cans of beer per week    Comment: occasinally     History  Drug Use  . Yes  . Special: "Crack" cocaine, Marijuana    Comment: occasionally    Social History   Social History  . Marital Status: Single    Spouse Name: N/A  . Number of Children: N/A  . Years of Education: N/A   Social History Main Topics  . Smoking status: Current Every Day Smoker -- 0.25 packs/day for 10 years    Types: Cigarettes  . Smokeless tobacco: Never Used  . Alcohol Use: 15.0 oz/week    25 Cans of beer per week     Comment: occasinally  . Drug Use: Yes    Special: "Crack" cocaine, Marijuana     Comment: occasionally  . Sexual  Activity: Yes    Birth Control/ Protection: Condom   Other Topics Concern  . None   Social History Narrative   Additional Social History:    Pain Medications: see mag Prescriptions: see mag Over the Counter: see mag History of alcohol / drug use?: Yes Negative Consequences of Use: Financial, Personal relationships            Patient currently lives by self, however her daughter is going to move with her soon. She denies legal problems. She is on SSD.        Sleep: Fair  Appetite:  Fair  Current Medications: Current Facility-Administered Medications  Medication Dose Route Frequency Provider  Last Rate Last Dose  . acetaminophen (TYLENOL) tablet 1,000 mg  1,000 mg Oral Q6H PRN Kerry Hough, PA-C   1,000 mg at 09/21/15 2158  . alum & mag hydroxide-simeth (MAALOX/MYLANTA) 200-200-20 MG/5ML suspension 30 mL  30 mL Oral Q4H PRN Kerry Hough, PA-C      . hydrOXYzine (ATARAX/VISTARIL) tablet 50 mg  50 mg Oral Q6H PRN Kerry Hough, PA-C   50 mg at 09/21/15 2158  . magnesium hydroxide (MILK OF MAGNESIA) suspension 30 mL  30 mL Oral Daily PRN Kerry Hough, PA-C      . multivitamin with minerals tablet 1 tablet  1 tablet Oral Daily Kerry Hough, PA-C   1 tablet at 09/23/15 0849  . nicotine polacrilex (NICORETTE) gum 2 mg  2 mg Oral PRN Jomarie Longs, MD      . QUEtiapine (SEROQUEL) tablet 400 mg  400 mg Oral QHS Saramma Eappen, MD      . QUEtiapine (SEROQUEL) tablet 50 mg  50 mg Oral Daily Kerry Hough, PA-C   50 mg at 09/23/15 0848  . traZODone (DESYREL) tablet 50 mg  50 mg Oral QHS,MR X 1 Kerry Hough, PA-C   50 mg at 09/21/15 2200    Lab Results:  Results for orders placed or performed during the hospital encounter of 09/21/15 (from the past 48 hour(s))  Lipid panel     Status: None   Collection Time: 09/23/15  6:40 AM  Result Value Ref Range   Cholesterol 182 0 - 200 mg/dL   Triglycerides 94 <161 mg/dL   HDL 88 >09 mg/dL   Total CHOL/HDL Ratio 2.1 RATIO   VLDL 19 0 - 40 mg/dL   LDL Cholesterol 75 0 - 99 mg/dL    Comment:        Total Cholesterol/HDL:CHD Risk Coronary Heart Disease Risk Table                     Men   Women  1/2 Average Risk   3.4   3.3  Average Risk       5.0   4.4  2 X Average Risk   9.6   7.1  3 X Average Risk  23.4   11.0        Use the calculated Patient Ratio above and the CHD Risk Table to determine the patient's CHD Risk.        ATP III CLASSIFICATION (LDL):  <100     mg/dL   Optimal  604-540  mg/dL   Near or Above                    Optimal  130-159  mg/dL   Borderline  981-191  mg/dL   High  >478     mg/dL  Very  High Performed at [facility]   TSH     Status: None   Collection Time: 09/23/15  6:40 AM  Result Value Ref Range   TSH 1.018 0.350 - 4.500 uIU/mL    Comment: Performed at [facility]    Physical Findings: AIMS: Facial and Oral Movements Muscles of Facial Expression: None, normal Lips and Perioral Area: None, normal Jaw: None, normal Tongue: None, normal,Extremity Movements Upper (arms, wrists, hands, fingers): None, normal Lower (legs, knees, ankles, toes): None, normal, Trunk Movements Neck, shoulders, hips: None, normal, Overall Severity Severity of abnormal movements (highest score from questions above): None, normal Incapacitation due to abnormal movements: None, normal Patient's awareness of abnormal movements (rate only patient's report): No Awareness, Dental Status Current problems with teeth and/or dentures?: No Does patient usually wear dentures?: No  CIWA:  CIWA-Ar Total: 0 COWS:     Musculoskeletal: Strength & Muscle Tone: within normal limits Gait & Station: normal Patient leans: N/A  Psychiatric Specialty Exam: Review of Systems  HENT: Positive for ear pain.        Denies discharge or tenderness , but states she feels that way when she has a GU infection.  Psychiatric/Behavioral: Positive for depression. The patient is nervous/anxious.   All other systems reviewed and are negative.   Blood pressure 98/67, pulse 88, temperature 98 F (36.7 C), temperature source Oral, resp. rate 16, height 5' (1.524 m), weight 56.246 kg (124 lb).Body mass index is 24.22 kg/(m^2).  General Appearance: Disheveled  Eye Solicitor::  Fair  Speech:  Pressured  Volume:  Increased  Mood:  Anxious, Euphoric and Irritable  Affect:  Labile  Thought Process:  Irrelevant  Orientation:  Full (Time, Place, and Person)  Thought Content:  Delusions and Rumination  Suicidal Thoughts:  No  Homicidal Thoughts:  No  Memory:  Immediate;   Fair Recent;    Fair Remote;   Fair  Judgement:  Impaired  Insight:  Shallow  Psychomotor Activity:  Increased  Concentration:  Poor  Recall:  [employer] of Knowledge:Fair  Language: Fair  Akathisia:  No  Handed:  Right  AIMS (if indicated):     Assets:  Communication Skills Desire for Improvement  ADL's:  Intact  Cognition: WNL  Sleep:  Number of Hours: 7   Treatment Plan Summary: Daily contact with patient to assess and evaluate symptoms and progress in treatment and Medication management   Will increase Seroquel to 450 mg po daily for her mood lability -she was not compliant on her medication. Will continue Trazodone 50 mg po qhs prn for sleep. Will continue to monitor vitals ,medication compliance and treatment side effects while patient is here.  Will monitor for medical issues as well as call consult as needed.  Reviewed labs- lipid panel- wnl ,hba1c- pending , tsh- wnl , as well as GC/CH - pending (since it was not processed in ED). CSW will start working on disposition.  Patient to participate in therapeutic milieu .    Eappen,Saramma MD 09/23/2015, 11:14 AM

## 2015-09-23 NOTE — [facility] Group Notes (Signed)
[facility] Group Notes:  (Counselor/Nursing/MHT/Case Management/Adjunct)  09/23/2015 1:15PM  Type of Therapy:  Group Therapy  Participation Level:  Active  Participation Quality:  Appropriate  Affect:  Flat  Cognitive:  Oriented  Insight:  Improving  Engagement in Group:  Limited  Engagement in Therapy:  Limited  Modes of Intervention:  Discussion, Exploration and Socialization  Summary of Progress/Problems: The topic for group was balance in life.  Pt participated in the discussion about when their life was in balance and out of balance and how this feels.  Pt discussed ways to get back in balance and short term goals they can work on to get where they want to be. [name] was attentive, and stayed the entire time, even though she was one of the last ones to be called upon.  Talked about her ability to cut back in her drug use and how that has allowed her to get her own place and continue to get outpatient services, things that have not happened in the past.  She was proud of this, rightfully so, and got positive feedback from others.   Ida Rogue 09/23/2015 3:40 PM

## 2015-09-23 NOTE — Progress Notes (Signed)
D   Pt is pleasant on approach and cooperative   She did not go to group this evening and said she just wanted to relax and get some sleep   She said she still hasnt slept as much as she wants to    She reports much improvement in her thinking and physical health since coming into the hospital    She is hoping to go home Saturday  A    Verbal support given   Medications administered and effectiveness monitored   Q 15 min checks R  Pt safe at present

## 2015-09-24 DIAGNOSIS — A749 Chlamydial infection, unspecified: Secondary | ICD-10-CM | POA: Clinically undetermined

## 2015-09-24 LAB — HEMOGLOBIN A1C
Hgb A1c MFr Bld: 5.9 % — ABNORMAL HIGH (ref 4.8–5.6)
Mean Plasma Glucose: 123 mg/dL

## 2015-09-24 LAB — PROLACTIN: PROLACTIN: 14.3 ng/mL (ref 4.8–23.3)

## 2015-09-24 MED ORDER — QUETIAPINE FUMARATE 50 MG PO TABS
50.0000 mg | ORAL_TABLET | Freq: Every day | ORAL | Status: DC
  Administered 2015-09-24 – 2015-09-26 (×3): 50 mg via ORAL
  Filled 2015-09-24 (×4): qty 1

## 2015-09-24 MED ORDER — AZITHROMYCIN 500 MG PO TABS
1000.0000 mg | ORAL_TABLET | Freq: Once | ORAL | Status: AC
  Administered 2015-09-24: 1000 mg via ORAL
  Filled 2015-09-24: qty 4
  Filled 2015-09-24: qty 2

## 2015-09-24 NOTE — Progress Notes (Signed)
[facility] MD Progress Note  09/24/2015 11:36 AM [name]  MRN:  [id number] Subjective: Patient states " See I was right , I was right about my chlamydia infection. That is why I was having my ear ache. "   Objective:This is yet, another admission assessment for [name], a 54 year old African-American female. [name] has been a patient in this hospital x numerous times, all related to mood instability & or polysubstance dependence. This admission , as per initial notes in EHR is 2/2 noncompliance on her medications.  Patient seen and chart reviewed.Discussed patient with treatment team.  Pt continues to be manic, pressured as well as delusional.Pt had expressed to writer that her ear ache was 2/2 her having an STD and that this has happened before. Pt today came back positive for Chlamydia - discussed treating her with a 1 x dose of Azythromycin 1000 mg. Pt agrees with plan , appears to be relieved. Pt continues to improve on her medications , but does appear manic and restless . She feels more restless during the day when her seroquel wears off. So discussed adding another dose in the afternoon. Pt reports sleep as improved, Per nursing - no disruptive issues noted on the unit.    Principal Problem: Bipolar disorder, current episode manic without psychotic features, severe Diagnosis:   Patient Active Problem List   Diagnosis Date Noted  . Chlamydia infection [A74.9] 09/24/2015  . Cannabis use disorder, moderate, dependence [F12.20] 09/22/2015  . Cocaine use disorder, moderate, dependence [F14.20] 09/22/2015  . Bipolar disorder, current episode manic without psychotic features, severe [F31.13] 09/22/2015  . Suicidal ideation [R45.851]   . Homicidal ideation [R45.850]   . Hyperglycemia [R73.9] 09/04/2012  . UTI (lower urinary tract infection) [N39.0] 09/04/2012  . Hallucination [R44.3] 08/31/2012  . Psychosis [F29] 11/16/2011    Class: Acute  . Polysubstance abuse [F19.10]  11/14/2011   Total Time spent with patient: 25 minutes  Past Psychiatric History: Patient with multiple admission to inpatient unit at [facility].   Past Medical History:  Past Medical History  Diagnosis Date  . Depression   . Bipolar 1 disorder   . Drug abuse   . Schizophrenia     Past Surgical History  Procedure Laterality Date  . No past surgeries     Family History:  Family History  Problem Relation Age of Onset  . Anesthesia problems Neg Hx   . Hypotension Neg Hx   . Malignant hyperthermia Neg Hx   . Pseudochol deficiency Neg Hx   . Mental illness Maternal Aunt    Family Psychiatric  History: Patient reports that her daughter also has a diagnosis of Bipolar disorder.   Social History:  History  Alcohol Use  . 15.0 oz/week  . 25 Cans of beer per week    Comment: occasinally     History  Drug Use  . Yes  . Special: "Crack" cocaine, Marijuana    Comment: occasionally    Social History   Social History  . Marital Status: Single    Spouse Name: N/A  . Number of Children: N/A  . Years of Education: N/A   Social History Main Topics  . Smoking status: Current Every Day Smoker -- 0.25 packs/day for 10 years    Types: Cigarettes  . Smokeless tobacco: Never Used  . Alcohol Use: 15.0 oz/week    25 Cans of beer per week     Comment: occasinally  . Drug Use: Yes    Special: "Crack" cocaine,  Marijuana     Comment: occasionally  . Sexual Activity: Yes    Birth Control/ Protection: Condom   Other Topics Concern  . None   Social History Narrative   Additional Social History:    Pain Medications: see mag Prescriptions: see mag Over the Counter: see mag History of alcohol / drug use?: Yes Negative Consequences of Use: Financial, Personal relationships            Patient currently lives by self, however her daughter is going to move with her soon. She denies legal problems. She is on SSD.        Sleep: Fair  Appetite:  Fair  Current  Medications: Current Facility-Administered Medications  Medication Dose Route Frequency Provider Last Rate Last Dose  . acetaminophen (TYLENOL) tablet 1,000 mg  1,000 mg Oral Q6H PRN Kerry Hough, PA-C   1,000 mg at 09/24/15 1610  . alum & mag hydroxide-simeth (MAALOX/MYLANTA) 200-200-20 MG/5ML suspension 30 mL  30 mL Oral Q4H PRN Kerry Hough, PA-C      . feeding supplement (ENSURE ENLIVE) (ENSURE ENLIVE) liquid 237 mL  237 mL Oral BID BM Sanjuana Kava, NP   237 mL at 09/24/15 1000  . hydrOXYzine (ATARAX/VISTARIL) tablet 50 mg  50 mg Oral Q6H PRN Kerry Hough, PA-C   50 mg at 09/21/15 2158  . magnesium hydroxide (MILK OF MAGNESIA) suspension 30 mL  30 mL Oral Daily PRN Kerry Hough, PA-C      . multivitamin with minerals tablet 1 tablet  1 tablet Oral Daily Kerry Hough, PA-C   1 tablet at 09/24/15 0818  . nicotine polacrilex (NICORETTE) gum 2 mg  2 mg Oral PRN Jomarie Longs, MD      . QUEtiapine (SEROQUEL) tablet 400 mg  400 mg Oral QHS Jomarie Longs, MD   400 mg at 09/23/15 2010  . QUEtiapine (SEROQUEL) tablet 50 mg  50 mg Oral Daily Kerry Hough, PA-C   50 mg at 09/24/15 0818  . QUEtiapine (SEROQUEL) tablet 50 mg  50 mg Oral QPC lunch Jomarie Longs, MD      . traZODone (DESYREL) tablet 50 mg  50 mg Oral QHS,MR X 1 Spencer E Simon, PA-C   50 mg at 09/21/15 2200    Lab Results:  Results for orders placed or performed during the hospital encounter of 09/21/15 (from the past 48 hour(s))  Lipid panel     Status: None   Collection Time: 09/23/15  6:40 AM  Result Value Ref Range   Cholesterol 182 0 - 200 mg/dL   Triglycerides 94 <960 mg/dL   HDL 88 >45 mg/dL   Total CHOL/HDL Ratio 2.1 RATIO   VLDL 19 0 - 40 mg/dL   LDL Cholesterol 75 0 - 99 mg/dL    Comment:        Total Cholesterol/HDL:CHD Risk Coronary Heart Disease Risk Table                     Men   Women  1/2 Average Risk   3.4   3.3  Average Risk       5.0   4.4  2 X Average Risk   9.6   7.1  3 X Average  Risk  23.4   11.0        Use the calculated Patient Ratio above and the CHD Risk Table to determine the patient's CHD Risk.        ATP III  CLASSIFICATION (LDL):  <100     mg/dL   Optimal  161-096  mg/dL   Near or Above                    Optimal  130-159  mg/dL   Borderline  045-409  mg/dL   High  >811     mg/dL   Very High Performed at [facility]   Hemoglobin A1c     Status: Abnormal   Collection Time: 09/23/15  6:40 AM  Result Value Ref Range   Hgb A1c MFr Bld 5.9 (H) 4.8 - 5.6 %    Comment: (NOTE)         Pre-diabetes: 5.7 - 6.4         Diabetes: >6.4         Glycemic control for adults with diabetes: <7.0    Mean Plasma Glucose 123 mg/dL    Comment: (NOTE) Performed At: [facility] [address] [place], [place] [id number] Mila Homer MD OZ:[id number] Performed at [facility]   TSH     Status: None   Collection Time: 09/23/15  6:40 AM  Result Value Ref Range   TSH 1.018 0.350 - 4.500 uIU/mL    Comment: Performed at [facility]  Prolactin     Status: None   Collection Time: 09/23/15  6:40 AM  Result Value Ref Range   Prolactin 14.3 4.8 - 23.3 ng/mL    Comment: (NOTE) Performed At: [facility] [address] [place], [place] [id number] Mila Homer MD KG:[id number] Performed at [facility]     Physical Findings: AIMS: Facial and Oral Movements Muscles of Facial Expression: None, normal Lips and Perioral Area: None, normal Jaw: None, normal Tongue: None, normal,Extremity Movements Upper (arms, wrists, hands, fingers): None, normal Lower (legs, knees, ankles, toes): None, normal, Trunk Movements Neck, shoulders, hips: None, normal, Overall Severity Severity of abnormal movements (highest score from questions above): None, normal Incapacitation due to abnormal movements: None, normal Patient's awareness of abnormal movements (rate only patient's report): No  Awareness, Dental Status Current problems with teeth and/or dentures?: No Does patient usually wear dentures?: No  CIWA:  CIWA-Ar Total: 0 COWS:     Musculoskeletal: Strength & Muscle Tone: within normal limits Gait & Station: normal Patient leans: N/A  Psychiatric Specialty Exam: Review of Systems  HENT: Positive for ear pain.        Denies discharge or tenderness , but states she feels that way when she has a GU infection.  Psychiatric/Behavioral: Positive for depression. The patient is nervous/anxious.   All other systems reviewed and are negative.   Blood pressure 135/87, pulse 87, temperature 98.6 F (37 C), temperature source Oral, resp. rate 18, height 5' (1.524 m), weight 56.246 kg (124 lb).Body mass index is 24.22 kg/(m^2).  General Appearance: Fairly Groomed  Patent attorney::  Fair  Speech:  Pressured  Volume:  Increased  Mood:  Anxious, Euphoric and Irritable  Affect:  Labile  Thought Process:  Irrelevant  Orientation:  Full (Time, Place, and Person)  Thought Content:  Delusions and Rumination  Suicidal Thoughts:  No  Homicidal Thoughts:  No  Memory:  Immediate;   Fair Recent;   Fair Remote;   Fair  Judgement:  Impaired  Insight:  Shallow  Psychomotor Activity:  Increased  Concentration:  Poor  Recall:  [employer] of Knowledge:Fair  Language: Fair  Akathisia:  No  Handed:  Right  AIMS (if indicated):     Assets:  Communication Skills Desire for Improvement  ADL's:  Intact  Cognition: WNL  Sleep:  Number of Hours: 6.75   Treatment Plan Summary: Daily contact with patient to assess and evaluate symptoms and progress in treatment and Medication management   Will increase Seroquel to 50 mg po bid as well as 400 mg at bedtime for her mood lability -she was not compliant on her medication. Will continue Trazodone 50 mg po qhs prn for sleep. Will continue to monitor vitals ,medication compliance and treatment side effects while patient is here.  Will monitor  for medical issues as well as call consult as needed.  Reviewed labs- chlamydia pos - will give Azithromycin 1000 mg x 1 dose. CSW will start working on disposition.  Patient to participate in therapeutic milieu .    Eappen,Saramma MD 09/24/2015, 11:36 AM

## 2015-09-24 NOTE — Progress Notes (Signed)
D: "I needed to come here to get back on my medication and rest". Pt planning discharge tomorrow to take care of daughter. Pt boosting about how much she gets for disability and shocked THC was only found in toxicology.  Pt reports she is tolerating medication well. Pt denies SI/HI/AVH. Pt attended and participated in evening wrap up group. Cooperative with assessment.  A: Met with pt 1:1. Medications administered as prescribed. Support and encouragement provided. Pt encouraged to discuss feelings and come to staff with any question or concerns.  R: Patient remains safe and complaint with medications.

## 2015-09-24 NOTE — Progress Notes (Signed)
Patient ID: [name], female   DOB: [birth year], 54 y.o.   MRN: [id number]  Pt currently presents with a animated affect and restless behavior. Per self inventory, pt rates depression at a 8, hopelessness 8 and anxiety 5. Pt's daily goal is "staying on meds," Pt reports fair sleep, a good appetite, high energy and poor concentration. Pt states "I feel better today. A little fidgety still." Pt main complaint still ear pain related to "what's going on down there."   Pt provided with medications per providers orders. Pt's labs and vitals were monitored throughout the day. Pt supported emotionally and encouraged to express concerns and questions. Pt educated on medications and safe sex practices. Md notified of pts concerns, pts antibiotic switched.   Pt's safety ensured with 15 minute and environmental checks. Pt currently denies SI/HI and A/V hallucinations. Pt verbally agrees to seek staff if SI/HI or A/VH occurs and to consult with staff before acting on these thoughts. Pt is cooperative and presents with decrease in pressured speech and increased appropriateness. Pt supports other pts recovery. Will continue POC.

## 2015-09-24 NOTE — Progress Notes (Signed)
[facility] Group Notes:  (Nursing/MHT/Case Management/Adjunct)  Date:  09/24/2015  Time:  8:41 PM  Type of Therapy:  Psychoeducational Skills  Participation Level:  Active  Participation Quality:  Appropriate  Affect:  Appropriate  Cognitive:  Appropriate  Insight:  Good  Engagement in Group:  Engaged  Modes of Intervention:  Education  Summary of Progress/Problems: The patient was late for group but participated appropriately. She states that she feels "less antsy" and that she is no longer feeling paranoid. The patient mentioned that braiding another patient's hair has helped her with her recovery and that she feels "more focused". The patient would like to go to [facility] for follow-up care and feels that she is ready to be discharged this weekend. As a theme for the day, (relapse prevention) she intends to remain drug free and verbalizes that she needs to stay away from all of the bad people in her life since they tend to encourage her to do bad things. The patient states that she is proud of the fact that this is the first time in eight years that she has come to the hospital without crack being in her system.   Hazle Coca S 09/24/2015, 8:41 PM

## 2015-09-24 NOTE — [facility] Group Notes (Signed)
[facility] LCSW Aftercare Discharge Planning Group Note   09/24/2015 9:30 AM  Participation Quality: Engaged  Mood/Affect: Appropriate  Depression Rating: denies,rates at 0  Anxiety Rating: rates at 7  Thoughts of Suicide: No Will you contract for safety? NA  Current AVH: No  Plan for Discharge/Comments: Plans to follow up at [facility] [place] for mental health care, can also refer for other needed services.  Considering referral to [facility] for meds mgmt, CSW will discuss w patient further.     Transportation Means: bus pass  Supports:  Limited family support  Santa Genera, LCSW Clinical Social Worker

## 2015-09-24 NOTE — [facility] Group Notes (Signed)
[facility] LCSW Group Therapy   09/24/2015 1:59 PM  Type of Therapy: Group Therapy  Participation Level:  [name]. Chose not to attend.  Summary of Progress/Problems: Chaplain was here to lead a group on themes of hope and/or courage.   Jonathon Jordan 09/24/2015 1:59 PM

## 2015-09-25 DIAGNOSIS — F3113 Bipolar disorder, current episode manic without psychotic features, severe: Principal | ICD-10-CM

## 2015-09-25 DIAGNOSIS — F1420 Cocaine dependence, uncomplicated: Secondary | ICD-10-CM

## 2015-09-25 DIAGNOSIS — F1220 Cannabis dependence, uncomplicated: Secondary | ICD-10-CM

## 2015-09-25 DIAGNOSIS — F319 Bipolar disorder, unspecified: Secondary | ICD-10-CM | POA: Insufficient documentation

## 2015-09-25 NOTE — Plan of Care (Signed)
Problem: Alteration in mood Goal: STG-Patient does not harm self or others Outcome: Progressing Pt denies SI/HI

## 2015-09-25 NOTE — Progress Notes (Signed)
Adult Psychoeducational Group Note  Date:  09/25/2015 Time:  8:45 PM  Group Topic/Focus:  Wrap-Up Group:   The focus of this group is to help patients review their daily goal of treatment and discuss progress on daily workbooks.  Participation Level:  Active  Participation Quality:  Appropriate  Affect:  Appropriate  Cognitive:  Appropriate  Insight: Appropriate  Engagement in Group:  Engaged  Modes of Intervention:  Discussion  Additional Comments: The patient expressed that she rates her day a 5.The patient also said that she  loooking forward to discharge.  Octavio Manns 09/25/2015, 8:45 PM

## 2015-09-25 NOTE — [facility] Group Notes (Signed)
[facility] Group Notes:  (Nursing/MHT/Case Management/Adjunct)  Date:  09/25/2015  Time:  0900 Type of Therapy:  Nurse Education  Participation Level:  Active  Participation Quality:  Appropriate and Attentive  Affect:  Excited  Cognitive:  Alert  Insight:  Good  Engagement in Group:  Engaged  Modes of Intervention:  Discussion, Exploration, Rapport Building, Socialization and Support  Summary of Progress/Problems:  Dara Hoyer 09/25/2015, 9:33 AM

## 2015-09-25 NOTE — Plan of Care (Signed)
Problem: Alteration in mood Goal: STG-Patient is able to attend at least 2 groups per day Outcome: Progressing Pt reports she has been engaging in milieu. Pt attended evening wrap up group.

## 2015-09-25 NOTE — Progress Notes (Signed)
[facility] MD Progress Note  09/25/2015 8:45 AM [name]  MRN:  [id number] Subjective: Patient states " I am doing a lot better than I was. I am not as paranoid or manic as I was. I am feeling pretty good, and I am ready to go home. 5-7 days is normally good for me, and my medications to get back in my system. I have a new phone that I want to play with and I was supposed to babyist this weekend. I have more energy than I did when I came in here. My ear is feeling better, I told yall that when I have something down here my ear hurts.  " Currently rates her anxiety at 2/10, hopelessness 0/10. Denies SI/HI/AVH.   Objective:This is yet, another admission assessment for [name], a 54 year old African-American female. [name] has been a patient in this hospital x numerous times, all related to mood instability & or polysubstance dependence. This admission , as per initial notes in EHR is 2/2 noncompliance on her medications.  Patient seen and chart reviewed.Discussed patient with treatment team.  Pt continues to be manic, pressured as well as delusional.Pt had expressed to writer that her ear ache was 2/2 her having an STD and that this has happened before. Pt has completed her dose of Azithromycin 1gram po  In  A single dose.  Pt appears to be relieved, but states she will go back to the [facility] in [place].  Pt continues to improve on her medications , but does appear manic and restless . She feels better with the additional dose of Seroquel.  Pt reports sleep as improved.  Per nursing - no disruptive issues noted on the unit. She has been active on the unit, and attending the groups.     Principal Problem: Bipolar disorder, current episode manic without psychotic features, severe (HCC) Diagnosis:   Patient Active Problem List   Diagnosis Date Noted  . Chlamydia infection [A74.9] 09/24/2015  . Cannabis use disorder, moderate, dependence [F12.20] 09/22/2015  . Cocaine use disorder, moderate,  dependence [F14.20] 09/22/2015  . Bipolar disorder, current episode manic without psychotic features, severe [F31.13] 09/22/2015  . Suicidal ideation [R45.851]   . Homicidal ideation [R45.850]   . Hyperglycemia [R73.9] 09/04/2012  . UTI (lower urinary tract infection) [N39.0] 09/04/2012  . Hallucination [R44.3] 08/31/2012  . Psychosis [F29] 11/16/2011    Class: Acute  . Polysubstance abuse [F19.10] 11/14/2011   Total Time spent with patient: 25 minutes  Past Psychiatric History: Patient with multiple admission to inpatient unit at [facility].   Past Medical History:  Past Medical History  Diagnosis Date  . Depression   . Bipolar 1 disorder   . Drug abuse   . Schizophrenia     Past Surgical History  Procedure Laterality Date  . No past surgeries     Family History:  Family History  Problem Relation Age of Onset  . Anesthesia problems Neg Hx   . Hypotension Neg Hx   . Malignant hyperthermia Neg Hx   . Pseudochol deficiency Neg Hx   . Mental illness Maternal Aunt    Family Psychiatric  History: Patient reports that her daughter also has a diagnosis of Bipolar disorder.   Social History:  History  Alcohol Use  . 15.0 oz/week  . 25 Cans of beer per week    Comment: occasinally     History  Drug Use  . Yes  . Special: "Crack" cocaine, Marijuana    Comment: occasionally  Social History   Social History  . Marital Status: Single    Spouse Name: N/A  . Number of Children: N/A  . Years of Education: N/A   Social History Main Topics  . Smoking status: Current Every Day Smoker -- 0.25 packs/day for 10 years    Types: Cigarettes  . Smokeless tobacco: Never Used  . Alcohol Use: 15.0 oz/week    25 Cans of beer per week     Comment: occasinally  . Drug Use: Yes    Special: "Crack" cocaine, Marijuana     Comment: occasionally  . Sexual Activity: Yes    Birth Control/ Protection: Condom   Other Topics Concern  . None   Social History Narrative   Additional  Social History:    Pain Medications: see mag Prescriptions: see mag Over the Counter: see mag History of alcohol / drug use?: Yes Negative Consequences of Use: Financial, Personal relationships      Patient currently lives by self, however her daughter is going to move with her soon. She denies legal problems. She is on SSD.  Sleep: Fair  Appetite:  Fair  Current Medications: Current Facility-Administered Medications  Medication Dose Route Frequency Provider Last Rate Last Dose  . acetaminophen (TYLENOL) tablet 1,000 mg  1,000 mg Oral Q6H PRN Kerry Hough, PA-C   1,000 mg at 09/24/15 2212  . alum & mag hydroxide-simeth (MAALOX/MYLANTA) 200-200-20 MG/5ML suspension 30 mL  30 mL Oral Q4H PRN Kerry Hough, PA-C      . feeding supplement (ENSURE ENLIVE) (ENSURE ENLIVE) liquid 237 mL  237 mL Oral BID BM Sanjuana Kava, NP   237 mL at 09/24/15 2112  . hydrOXYzine (ATARAX/VISTARIL) tablet 50 mg  50 mg Oral Q6H PRN Kerry Hough, PA-C   50 mg at 09/24/15 2212  . magnesium hydroxide (MILK OF MAGNESIA) suspension 30 mL  30 mL Oral Daily PRN Kerry Hough, PA-C      . multivitamin with minerals tablet 1 tablet  1 tablet Oral Daily Kerry Hough, PA-C   1 tablet at 09/25/15 1610  . nicotine polacrilex (NICORETTE) gum 2 mg  2 mg Oral PRN Jomarie Longs, MD      . QUEtiapine (SEROQUEL) tablet 400 mg  400 mg Oral QHS Jomarie Longs, MD   400 mg at 09/24/15 2110  . QUEtiapine (SEROQUEL) tablet 50 mg  50 mg Oral Daily Kerry Hough, PA-C   50 mg at 09/25/15 9604  . QUEtiapine (SEROQUEL) tablet 50 mg  50 mg Oral QPC lunch Jomarie Longs, MD   50 mg at 09/24/15 1357  . traZODone (DESYREL) tablet 50 mg  50 mg Oral QHS,MR X 1 Spencer E Simon, PA-C   50 mg at 09/21/15 2200    Lab Results:  No results found for this or any previous visit (from the past 48 hour(s)).  Physical Findings: AIMS: Facial and Oral Movements Muscles of Facial Expression: None, normal Lips and Perioral Area: None,  normal Jaw: None, normal Tongue: None, normal,Extremity Movements Upper (arms, wrists, hands, fingers): None, normal Lower (legs, knees, ankles, toes): None, normal, Trunk Movements Neck, shoulders, hips: None, normal, Overall Severity Severity of abnormal movements (highest score from questions above): None, normal Incapacitation due to abnormal movements: None, normal Patient's awareness of abnormal movements (rate only patient's report): No Awareness, Dental Status Current problems with teeth and/or dentures?: No Does patient usually wear dentures?: No  CIWA:  CIWA-Ar Total: 0 COWS:     Musculoskeletal:  Strength & Muscle Tone: within normal limits Gait & Station: normal Patient leans: N/A  Psychiatric Specialty Exam: Review of Systems  HENT: Positive for ear pain.        Denies discharge or tenderness , but states she feels that way when she has a GU infection.  Respiratory: Positive for cough.   Psychiatric/Behavioral: Positive for depression. The patient is nervous/anxious.   All other systems reviewed and are negative.   Blood pressure 135/87, pulse 87, temperature 98.6 F (37 C), temperature source Oral, resp. rate 18, height 5' (1.524 m), weight 56.246 kg (124 lb).Body mass index is 24.22 kg/(m^2).  General Appearance: Fairly Groomed  Patent attorney::  Fair  Speech:  Pressured  Volume:  Increased  Mood:  Anxious and Euphoric  Affect:  Labile  Thought Process:  Circumstantial and Linear  Orientation:  Full (Time, Place, and Person)  Thought Content:  Rumination  Suicidal Thoughts:  No  Homicidal Thoughts:  No  Memory:  Immediate;   Fair Recent;   Fair Remote;   Fair  Judgement:  Impaired  Insight:  Shallow  Psychomotor Activity:  Increased  Concentration:  Poor  Recall:  [employer] of Knowledge:Fair  Language: Fair  Akathisia:  No  Handed:  Right  AIMS (if indicated):     Assets:  Communication Skills Desire for Improvement  ADL's:  Intact  Cognition: WNL   Sleep:  Number of Hours: 5.5   Treatment Plan Summary: Daily contact with patient to assess and evaluate symptoms and progress in treatment and Medication management   Will increase Seroquel to 50 mg po bid as well as 400 mg at bedtime for her mood lability -she was not compliant on her medication. Will continue Trazodone 50 mg po qhs prn for sleep. Will continue to monitor vitals ,medication compliance and treatment side effects while patient is here.  Will monitor for medical issues as well as call consult as needed.  Reviewed labs- chlamydia pos - will give Azithromycin 1000 mg x 1 dose. She is advised to return to [facility] for test of cure, repeat std testing.  CSW will start working on disposition.  Patient to participate in therapeutic milieu .    Malachy Chamber S FNP-BC  09/25/2015, 8:45 AM Agree with Progress Note as above  Nehemiah Massed, MD

## 2015-09-25 NOTE — Progress Notes (Signed)
D:Pt presents with hyperactivity, but pleasant on approach. Pt denies SI/HI. Denies AVH. Denies physical pain. Pt reports she is ready to be discharged. Noted interacting on the unit with nursing staff and peers. Pt reports her goal is to "stay on medication"Pt actively participated in nursing group on the unit.   A:Special checks q 15 mins in place for safety. Medication administered per MD order(see eMAR). Encouragement and support provided.  R:Safety maintained. Compliant with medication regimen. Will continue to monitor.

## 2015-09-25 NOTE — Progress Notes (Signed)
D: pt stated: I have patient with providers and plans to discharge tomorrow. Reports she is back on medication, sleeping, and feeling good. 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 Pt reports she is tolerating medication well. Pt denies SI/HI/AVH and pain. Pt attended and participated in evening wrap up group. Cooperative with assessment.  A: Met with pt 1:1. Medications administered as prescribed. Support and encouragement provided. Pt encouraged to discuss feelings and come to staff with any question or concerns.  R: Patient remains safe and complaint with medications.

## 2015-09-25 NOTE — [facility] Group Notes (Signed)
[facility] Group Notes: (Clinical Social Work)   09/25/2015      Type of Therapy:  Group Therapy   Participation Level:  Did Not Attend despite MHT prompting   Ambrose Mantle, LCSW 09/25/2015, 12:01 PM

## 2015-09-26 MED ORDER — QUETIAPINE FUMARATE 50 MG PO TABS: 50.0000 mg | ORAL_TABLET | Freq: Every day | ORAL | Status: DC

## 2015-09-26 MED ORDER — QUETIAPINE FUMARATE 400 MG PO TABS: 400.0000 mg | ORAL_TABLET | Freq: Every day | ORAL | Status: DC

## 2015-09-26 NOTE — Progress Notes (Signed)
D:Per patient self inventory form pt reports she slept good last night. She reports a good appetite, normal energy level, good concentration. She rates depression 0/10, hopelessness 0/10, anxiety 0/10- all on 0-10 scale, 10 being the worse.  Pt denies physical pain. Pt denies SI/HI. Denies AVH. Pt reports her goal for the day is  "going home" Pt behavior cooperative. She voices no complaints at this time.   A:Special checks q 15 mins in place for safety. Medication administered per MD order(See eMAR). Encouragement and support provided. Discharge planning in place.  R:Safety maintained. Compliant with medication regimen. Will continue to monitor.

## 2015-09-26 NOTE — Discharge Summary (Signed)
Physician Discharge Summary Note  Patient:  [name] is an 54 y.o., female MRN:  [id number] DOB:  Aug 30, 1961 Patient phone:  [phone] (home)  Patient address:   [address] [place] [address],  Total Time spent with patient: 45 minutes  Date of Admission:  09/21/2015 Date of Discharge: 09/26/2015  Reason for Admission:  54 year old African-American female. Miaa has been a patient in this hospital x numerous times, all related to mood instability & or polysubstance dependence. This time around, she reports, "I was off of my medicines x 1 week, became manic that lasted for 4 days. I also was feeling paranoid for about 5 days, unable to sleep for 1 week. My daughter got concerned & took me to the ED. I am still feeling manic, but not as bad. I'm hearing no voices or seeing things. I'm not suicidal or homicidal. I just need to get back on my medicines; Seroquel 50 mg bid & 300 mg Q hs respectively for agitation/mood control".   Principal Problem: Bipolar disorder, current episode manic without psychotic features, severe [facility]) Discharge Diagnoses: Patient Active Problem List   Diagnosis Date Noted  . Affective psychosis, bipolar (HCC) [F31.9]   . Chlamydia infection [A74.9] 09/24/2015  . Cannabis use disorder, moderate, dependence (HCC) [F12.20] 09/22/2015  . Cocaine use disorder, moderate, dependence (HCC) [F14.20] 09/22/2015  . Bipolar disorder, current episode manic without psychotic features, severe (HCC) [F31.13] 09/22/2015  . Suicidal ideation [R45.851]   . Homicidal ideation [R45.850]   . Hyperglycemia [R73.9] 09/04/2012  . UTI (lower urinary tract infection) [N39.0] 09/04/2012  . Hallucination [R44.3] 08/31/2012  . Psychosis [F29] 11/16/2011    Class: Acute  . Polysubstance abuse [F19.10] 11/14/2011    Musculoskeletal: Strength & Muscle Tone: within normal limits Gait & Station: normal Patient leans: N/A  Psychiatric Specialty Exam: Physical Exam   Constitutional: She is oriented to person, place, and time. She appears well-developed.  HENT:  Head: Normocephalic.  Eyes: Pupils are equal, round, and reactive to light.  Neck: Normal range of motion.  Musculoskeletal: Normal range of motion.  Neurological: She is alert and oriented to person, place, and time.  Skin: Skin is warm and dry.    Review of Systems  All other systems reviewed and are negative.   Blood pressure 121/57, pulse 80, temperature 98.4 F (36.9 C), temperature source Oral, resp. rate 16, height 5' (1.524 m), weight 56.246 kg (124 lb).Body mass index is 24.22 kg/(m^2).  General Appearance: Fairly Groomed  Patent attorney::  Fair  Speech:  Clear and Coherent and Normal Rate  Volume:  Normal  Mood:  Anxious, Euthymic and Irritable, she has been waiting on her discharge today  Affect:  Appropriate  Thought Process:  Coherent, Goal Directed and Intact  Orientation:  Full (Time, Place, and Person)  Thought Content:  WDL  Suicidal Thoughts:  No  Homicidal Thoughts:  No  Memory:  Immediate;   Fair Recent;   Fair Remote;   Fair  Judgement:  Fair  Insight:  Fair  Psychomotor Activity:  Normal  Concentration:  Fair  Recall:  Good  Fund of Knowledge:Fair  Language: Good  Akathisia:  No  Handed:  Right  AIMS (if indicated):     Assets:  Communication Skills Desire for Improvement Financial Resources/Insurance Physical Health Social Support Talents/Skills Vocational/Educational  ADL's:  Intact  Cognition: WNL  Sleep:  Number of Hours: 6.5   Have you used any form of tobacco in the last 30 days? (  Cigarettes, Smokeless Tobacco, Cigars, and/or Pipes): Yes  Has this patient used any form of tobacco in the last 30 days? (Cigarettes, Smokeless Tobacco, Cigars, and/or Pipes) Yes, A prescription for an FDA-approved tobacco cessation medication was offered at discharge and the patient refused  Past Medical History:  Past Medical History  Diagnosis Date  .  Depression   . Bipolar 1 disorder   . Drug abuse   . Schizophrenia     Past Surgical History  Procedure Laterality Date  . No past surgeries     Family History:  Family History  Problem Relation Age of Onset  . Anesthesia problems Neg Hx   . Hypotension Neg Hx   . Malignant hyperthermia Neg Hx   . Pseudochol deficiency Neg Hx   . Mental illness Maternal Aunt    Social History:  History  Alcohol Use  . 15.0 oz/week  . 25 Cans of beer per week    Comment: occasinally     History  Drug Use  . Yes  . Special: "Crack" cocaine, Marijuana    Comment: occasionally    Social History   Social History  . Marital Status: Single    Spouse Name: N/A  . Number of Children: N/A  . Years of Education: N/A   Social History Main Topics  . Smoking status: Current Every Day Smoker -- 0.25 packs/day for 10 years    Types: Cigarettes  . Smokeless tobacco: Never Used  . Alcohol Use: 15.0 oz/week    25 Cans of beer per week     Comment: occasinally  . Drug Use: Yes    Special: "Crack" cocaine, Marijuana     Comment: occasionally  . Sexual Activity: Yes    Birth Control/ Protection: Condom   Other Topics Concern  . None   Social History Narrative    Past Psychiatric History: Hospitalizations:  Outpatient Care: [facility]   Substance Abuse Care: None   Self-Mutilation: Wrist  Suicidal Attempts: once time over ten years ago, Cut wrist  Violent Behaviors: No    Risk to Self: Is patient at risk for suicide?: No What has been your use of drugs/alcohol within the last 12 months?: Pt uses about a $100 worth every week, once a week. Risk to Others:   Prior Inpatient Therapy:   Prior Outpatient Therapy:    Level of Care:  IOP  Hospital Course:   Cohen Doleman was admitted for Bipolar disorder, current episode manic without psychotic features, severe (HCC) and crisis management.  She was treated discharged with the medications listed below under Medication List.  Medical  problems were identified and treated as needed.  Home medications were restarted as appropriate.  Improvement was monitored by observation and [name] daily report of symptom reduction.  Emotional and mental status was monitored by daily self-inventory reports completed by [name] and clinical staff.         Mckenlee Mangham was evaluated by the treatment team for stability and plans for continued recovery upon discharge.  Brazil Voytko motivation was an integral factor for scheduling further treatment.  Employment, transportation, bed availability, health status, family support, and any pending legal issues were also considered during her hospital stay. She was offered further treatment options upon discharge including but not limited to Residential, Intensive Outpatient, and Outpatient treatment.  Khamryn Calderone will follow up with the services as listed below under Follow Up Information.     Upon completion of this admission the patient was both mentally and  medically stable for discharge denying suicidal/homicidal ideation, auditory/visual/tactile hallucinations, delusional thoughts and paranoia.      Upon discharge she plans keeping all her appointments at [facility] and signing up for the transitional team on Tuesday.  Consults:  None  Significant Diagnostic Studies:  None  Discharge Vitals:   Blood pressure 121/57, pulse 80, temperature 98.4 F (36.9 C), temperature source Oral, resp. rate 16, height 5' (1.524 m), weight 56.246 kg (124 lb). Body mass index is 24.22 kg/(m^2). Lab Results:   No results found for this or any previous visit (from the past 72 hour(s)).  Physical Findings: AIMS: Facial and Oral Movements Muscles of Facial Expression: None, normal Lips and Perioral Area: None, normal Jaw: None, normal Tongue: None, normal,Extremity Movements Upper (arms, wrists, hands, fingers): None, normal Lower (legs, knees, ankles, toes): None, normal, Trunk  Movements Neck, shoulders, hips: None, normal, Overall Severity Severity of abnormal movements (highest score from questions above): None, normal Incapacitation due to abnormal movements: None, normal Patient's awareness of abnormal movements (rate only patient's report): No Awareness, Dental Status Current problems with teeth and/or dentures?: No Does patient usually wear dentures?: No  CIWA:  CIWA-Ar Total: 0 COWS:      See Psychiatric Specialty Exam and Suicide Risk Assessment completed by Attending Physician prior to discharge.   Discharge destination:  Home  Is patient on multiple antipsychotic therapies at discharge:  No   Has Patient had three or more failed trials of antipsychotic monotherapy by history:  No   Recommended Plan for Multiple Antipsychotic Therapies: NA     Medication List    STOP taking these medications        metroNIDAZOLE 500 MG tablet  Commonly known as:  FLAGYL     penicillin v potassium 500 MG tablet  Commonly known as:  VEETID      TAKE these medications      Indication   acetaminophen 500 MG tablet  Commonly known as:  TYLENOL  Take 1,000 mg by mouth every 6 (six) hours as needed for moderate pain.      hydrOXYzine 25 MG tablet  Commonly known as:  ATARAX/VISTARIL  Take 1 tablet (25 mg total) by mouth every 6 (six) hours as needed for anxiety.   Indication:  Anxiety Neurosis     multivitamin with minerals Tabs tablet  Take 1 tablet by mouth daily.   Indication:  Low Vitamin     QUEtiapine 300 MG tablet  Commonly known as:  SEROQUEL  Take 1 tablet (300 mg total) by mouth at bedtime.   Indication:  Mood control     QUEtiapine 400 MG tablet  Commonly known as:  SEROQUEL  Take 1 tablet (400 mg total) by mouth at bedtime.   Indication:  Manic Phase of Manic-Depression, Mood control     QUEtiapine 50 MG tablet  Commonly known as:  SEROQUEL  Take 1 tablet (50 mg total) by mouth daily.   Indication:  Agitation/mood control            Follow-up Information    Follow up with [facility] In 2 days.   Why:  Will be followed by [facility] Team.  Will be contacted with a [facility] appt by TCT team.  Also can go to [facility] Tuesday 09/28/15 from 8:00am-3:00pm for an assessment.   Contact information:   [address] [place]  [address] Phone:  [phone] Fax:  [phone]       Follow-up recommendations:  Activity:  Increase activity Diet:  Regular diet Tests:  Repeat Gonorrhea and Chlamydia std testing in 3 months.   Comments:  Take all medications as prescribed. Keep all follow-up appointments as scheduled.  Do not consume alcohol or use illegal drugs while on prescription medications. Report any adverse effects from your medications to your primary care provider promptly.  In the event of recurrent symptoms or worsening symptoms, call 911, a crisis hotline, or go to the nearest emergency department for evaluation.    Patient was insistent on going home today. Upon consultation with the Psychiatrist he determined that she was safe to go home. Assessment reveals that patient is very familiar with [facility] rules and policies, and continues to say that she is here voluntary and 5 days is more than enough time for her. She was irritable during the interview but states this was due to her waiting since this mornng to be discharged. Explained to patient she was not on the list for discharges today which is why her papers were not completed and processed this morning. She verbalizes understanding and agrees with the plans. Total Discharge Time: Greater than 45 mintues.   Signed: Truman Hayward FNP-BC 09/26/2015, 2:44 PM  Patient seen, Suicide Assessment Completed.  Disposition Plan Reviewed

## 2015-09-26 NOTE — [facility] Suicide Risk Assessment (Signed)
[name] Discharge Suicide Risk Assessment   Demographic Factors:  54 year old single female, lives alone, on disability  Total Time spent with patient: 30 minutes  Musculoskeletal: Strength & Muscle Tone: within normal limits Gait & Station: normal Patient leans: N/A  Psychiatric Specialty Exam: Physical Exam  ROS  Blood pressure 121/57, pulse 80, temperature 98.4 F (36.9 C), temperature source Oral, resp. rate 16, height 5' (1.524 m), weight 124 lb (56.246 kg).Body mass index is 24.22 kg/(m^2).  General Appearance: improved grooming   Eye Contact::  Good  Speech:   Slightly pressured at times, but has improved   Volume:  Normal  Mood:  Euthymic  Affect:  less expanisve, appropriate, denies depression  Thought Process:  Linear  Orientation:  Full (Time, Place, and Person)  Thought Content:  denies hallucinations, no delusions, not internally preoccupied   Suicidal Thoughts:  No denies any self injurious ideations, denies SI  Homicidal Thoughts:  No denies any violent or homicidal ideations  Memory:  recent and remote grossly intact   Judgement:  Other:  improving compafred to admission  Insight:  Fair  Psychomotor Activity:  Normal  Concentration:  Good  Recall:  Good  Fund of Knowledge:Good  Language: Good  Akathisia:  Negative  Handed:  Right  AIMS (if indicated):     Assets:  Communication Skills Desire for Improvement Housing Resilience  Sleep:  Number of Hours: 6.5  Cognition: WNL  ADL's:  Improved    Have you used any form of tobacco in the last 30 days? (Cigarettes, Smokeless Tobacco, Cigars, and/or Pipes): Yes  Has this patient used any form of tobacco in the last 30 days? (Cigarettes, Smokeless Tobacco, Cigars, and/or Pipes) Yes, A prescription for an FDA-approved tobacco cessation medication was offered at discharge and the patient refused  Mental Status Per Nursing Assessment::   On Admission:     Current Mental Status by Physician: Patient has improved  compared to admission presentation- she still presents somewhat pressured in speech at times, but in general presents calm, cooperative, not euphoric or irritable, denying any depression, with a less expansive affect, no thought disorder, no SI or HI, no psychotic symptoms, no grandiose ideations, 0x3. She has been wanting to discharge and at this time there are no grounds for involuntary commitment .   Loss Factors: States " I had not been taking my medications before I came in "  Historical Factors:  History of Bipolar Disorder, history of prior psychiatric admissions, history of cannabis and cocaine abuse . One suicide attempt 10 years ago by OD.   Risk Reduction Factors:   Sense of responsibility to family and Positive coping skills or problem solving skills  Continued Clinical Symptoms:  As noted, currently improved compared to admission- no SI, no HI, no psychotic symptoms, no overtly disruptive or agitated behaviors, better able to manage ADLs   Cognitive Features That Contribute To Risk:  No gross cognitive deficits noted upon discharge. Is alert , attentive, and oriented x 3   Suicide Risk:  Mild:  Suicidal ideation of limited frequency, intensity, duration, and specificity.  There are no identifiable plans, no associated intent, mild dysphoria and related symptoms, good self-control (both objective and subjective assessment), few other risk factors, and identifiable protective factors, including available and accessible social support.  Principal Problem: Bipolar disorder, current episode manic without psychotic features, severe [facility]) Discharge Diagnoses:  Patient Active Problem List   Diagnosis Date Noted  . Affective psychosis, bipolar (HCC) [F31.9]   .  Chlamydia infection [A74.9] 09/24/2015  . Cannabis use disorder, moderate, dependence (HCC) [F12.20] 09/22/2015  . Cocaine use disorder, moderate, dependence (HCC) [F14.20] 09/22/2015  . Bipolar disorder, current episode manic  without psychotic features, severe (HCC) [F31.13] 09/22/2015  . Suicidal ideation [R45.851]   . Homicidal ideation [R45.850]   . Hyperglycemia [R73.9] 09/04/2012  . UTI (lower urinary tract infection) [N39.0] 09/04/2012  . Hallucination [R44.3] 08/31/2012  . Psychosis [F29] 11/16/2011    Class: Acute  . Polysubstance abuse [F19.10] 11/14/2011    Follow-up Information    Follow up with [facility].   Why:  Will be followed by [facility] Team.  Will be contacted with a [facility] appt by TCT team.   Contact information:   [address] [place]  [address] Phone:  [phone] Fax:  [phone]       Plan Of Care/Follow-up recommendations:  Activity:  as tolerated  Diet:  Regular Tests:  NA Other:  See below   Is patient on multiple antipsychotic therapies at discharge:  No   Has Patient had three or more failed trials of antipsychotic monotherapy by history:  No  Recommended Plan for Multiple Antipsychotic Therapies: NA  Patient is requesting discharge and there are no current grounds for involuntary commitment . She is leaving in good spirits . She plans to return home and to follow up at [facility]. States " I intend to stay on my medications this time". States she is motivated in remaining sober .    [name] 09/26/2015, 11:25 AM

## 2015-09-26 NOTE — [facility] Group Notes (Signed)
[facility] Group Notes:  (Clinical Social Work)  09/26/2015  [facility] Group Notes:  (Clinical Social Work)  09/26/2015  11:00AM-12:00PM  Summary of Progress/Problems:  The main focus of today's process group was to listen to a variety of genres of music and to identify that different types of music provoke different responses.  The patient then was able to identify personally what was soothing for them, as well as energizing.  Handouts were used to record feelings evoked, as well as how patient can personally use this knowledge in sleep habits, with depression, and with other symptoms.  The patient expressed understanding of concepts, as well as knowledge of how each type of music affected him/her and how this can be used at home as a wellness/recovery tool.  She initially refused to come to group, said she wanted to go home and not listen to music.  However, she did come in about 20 minutes prior to the end of group and enjoyed the remainder, interacted.  Type of Therapy:  Music Therapy   Participation Level:  Minimal  Participation Quality:  Resistant then Attentive  Affect:  Blunted  Cognitive:  Oriented  Insight:  Engaged  Engagement in Therapy:  Engaged  Modes of Intervention:   Activity, Exploration  Stephanie Mantle, LCSW 09/26/2015

## 2015-09-26 NOTE — Progress Notes (Signed)
Pt discharged per MD order. Pt eager for discharge. Discharge summary reviewed with pt. Pt verbalizes and signs understanding of discharge. RX given to pt. Pt denies SI/HI. Denies AVH at discharge. Bus pass given to pt at discharge. All items returned to pt from locker #60. Pt verbalizes and signs that she received all personal items at discharge. Denies physical pain. Ambulatory out of facility to lobby.

## 2015-09-26 NOTE — Progress Notes (Signed)
  [facility] Adult Case Management Discharge Plan :  Will you be returning to the same living situation after discharge:  Yes,  alone At discharge, do you have transportation home?: Yes,  daughter Do you have the ability to pay for your medications: Yes,  insurance  Release of information consent forms completed and in the chart;  Patient's signature needed at discharge.  Patient to Follow up at: Follow-up Information    Follow up with [facility] In 2 days.   Why:  Will be followed by [facility] Team.  Will be contacted with a [facility] appt by TCT team.  Also can go to [facility] Tuesday 09/28/15 from 8:00am-3:00pm for an assessment.   Contact information:   [address] [place]  [address] Phone:  [phone] Fax:  [phone]       Patient denies SI/HI: Yes,  see doctor note    Safety Planning and Suicide Prevention discussed: Yes,  with pt, refused with family  Have you used any form of tobacco in the last 30 days? (Cigarettes, Smokeless Tobacco, Cigars, and/or Pipes): Yes  Has patient been referred to the Quitline?: Patient refused referral  Sarina Ser 09/26/2015, 12:59 PM

## 2015-09-26 NOTE — Progress Notes (Signed)
[facility] MD Progress Note  09/26/2015 10:23 AM [name]  MRN:  [id number] Subjective: Patient states "I am doing much better, after my 4th day here I can go. See this is my routine and by day 4 the medications are normally in my system. I plan on attending [facility], and they can get my transportation to [place] so that I can stay on my meds.  " Currently rates her depression 0/10, anxiety at 0/10, hopelessness 0/10. Denies SI/HI/AVH. I plan to go to [facility] so that they can help me plan my day, and I don't have to come here when I run out of medications.   Objective:This is yet, another admission assessment for [name], a 54 year old African-American female. [name] has been a patient in this hospital x numerous times, all related to mood instability & or polysubstance dependence. This admission , as per initial notes in EHR is 2/2 noncompliance on her medications.Pt is observed picking the polish off her nails, but denies any anxiety at this time.   Patient seen and chart reviewed.Pt is insisted about going home. Pt continues to show improvement with her mood and manic episodes. Pt continues to improve on her medications. She feels better with the additional dose of Seroquel.  Pt reports sleep as improved. Per nursing - no disruptive issues noted on the unit. She has been active on the unit, and attending the groups.      Principal Problem: Bipolar disorder, current episode manic without psychotic features, severe (HCC) Diagnosis:   Patient Active Problem List   Diagnosis Date Noted  . Affective psychosis, bipolar (HCC) [F31.9]   . Chlamydia infection [A74.9] 09/24/2015  . Cannabis use disorder, moderate, dependence (HCC) [F12.20] 09/22/2015  . Cocaine use disorder, moderate, dependence (HCC) [F14.20] 09/22/2015  . Bipolar disorder, current episode manic without psychotic features, severe (HCC) [F31.13] 09/22/2015  . Suicidal ideation [R45.851]   . Homicidal ideation  [R45.850]   . Hyperglycemia [R73.9] 09/04/2012  . UTI (lower urinary tract infection) [N39.0] 09/04/2012  . Hallucination [R44.3] 08/31/2012  . Psychosis [F29] 11/16/2011    Class: Acute  . Polysubstance abuse [F19.10] 11/14/2011   Total Time spent with patient: 25 minutes  Past Psychiatric History: Patient with multiple admission to inpatient unit at [facility].   Past Medical History:  Past Medical History  Diagnosis Date  . Depression   . Bipolar 1 disorder   . Drug abuse   . Schizophrenia     Past Surgical History  Procedure Laterality Date  . No past surgeries     Family History:  Family History  Problem Relation Age of Onset  . Anesthesia problems Neg Hx   . Hypotension Neg Hx   . Malignant hyperthermia Neg Hx   . Pseudochol deficiency Neg Hx   . Mental illness Maternal Aunt    Family Psychiatric  History: Patient reports that her daughter also has a diagnosis of Bipolar disorder.   Social History:  History  Alcohol Use  . 15.0 oz/week  . 25 Cans of beer per week    Comment: occasinally     History  Drug Use  . Yes  . Special: "Crack" cocaine, Marijuana    Comment: occasionally    Social History   Social History  . Marital Status: Single    Spouse Name: N/A  . Number of Children: N/A  . Years of Education: N/A   Social History Main Topics  . Smoking status: Current Every Day Smoker -- 0.25 packs/day  for 10 years    Types: Cigarettes  . Smokeless tobacco: Never Used  . Alcohol Use: 15.0 oz/week    25 Cans of beer per week     Comment: occasinally  . Drug Use: Yes    Special: "Crack" cocaine, Marijuana     Comment: occasionally  . Sexual Activity: Yes    Birth Control/ Protection: Condom   Other Topics Concern  . None   Social History Narrative   Additional Social History:    Pain Medications: see mag Prescriptions: see mag Over the Counter: see mag History of alcohol / drug use?: Yes Negative Consequences of Use: Financial, Personal  relationships      Patient currently lives by self, however her daughter is going to move with her soon. She denies legal problems. She is on SSD.  Sleep: Good  Appetite:  Good  Current Medications: Current Facility-Administered Medications  Medication Dose Route Frequency Provider Last Rate Last Dose  . acetaminophen (TYLENOL) tablet 1,000 mg  1,000 mg Oral Q6H PRN Kerry Hough, PA-C   1,000 mg at 09/24/15 2212  . alum & mag hydroxide-simeth (MAALOX/MYLANTA) 200-200-20 MG/5ML suspension 30 mL  30 mL Oral Q4H PRN Kerry Hough, PA-C      . feeding supplement (ENSURE ENLIVE) (ENSURE ENLIVE) liquid 237 mL  237 mL Oral BID BM Sanjuana Kava, NP   237 mL at 09/26/15 0940  . hydrOXYzine (ATARAX/VISTARIL) tablet 50 mg  50 mg Oral Q6H PRN Kerry Hough, PA-C   50 mg at 09/24/15 2212  . magnesium hydroxide (MILK OF MAGNESIA) suspension 30 mL  30 mL Oral Daily PRN Kerry Hough, PA-C      . multivitamin with minerals tablet 1 tablet  1 tablet Oral Daily Kerry Hough, PA-C   1 tablet at 09/26/15 0741  . nicotine polacrilex (NICORETTE) gum 2 mg  2 mg Oral PRN Jomarie Longs, MD      . QUEtiapine (SEROQUEL) tablet 400 mg  400 mg Oral QHS Jomarie Longs, MD   400 mg at 09/25/15 2138  . QUEtiapine (SEROQUEL) tablet 50 mg  50 mg Oral Daily Kerry Hough, PA-C   50 mg at 09/26/15 0741  . QUEtiapine (SEROQUEL) tablet 50 mg  50 mg Oral QPC lunch Jomarie Longs, MD   50 mg at 09/25/15 1301  . traZODone (DESYREL) tablet 50 mg  50 mg Oral QHS,MR X 1 Spencer E Simon, PA-C   50 mg at 09/21/15 2200    Lab Results:  No results found for this or any previous visit (from the past 48 hour(s)).  Physical Findings: AIMS: Facial and Oral Movements Muscles of Facial Expression: None, normal Lips and Perioral Area: None, normal Jaw: None, normal Tongue: None, normal,Extremity Movements Upper (arms, wrists, hands, fingers): None, normal Lower (legs, knees, ankles, toes): None, normal, Trunk  Movements Neck, shoulders, hips: None, normal, Overall Severity Severity of abnormal movements (highest score from questions above): None, normal Incapacitation due to abnormal movements: None, normal Patient's awareness of abnormal movements (rate only patient's report): No Awareness, Dental Status Current problems with teeth and/or dentures?: No Does patient usually wear dentures?: No  CIWA:  CIWA-Ar Total: 0 COWS:     Musculoskeletal: Strength & Muscle Tone: within normal limits Gait & Station: normal Patient leans: N/A  Psychiatric Specialty Exam: Review of Systems  HENT: Positive for ear pain.        Denies discharge or tenderness , but states she feels that way when  she has a GU infection.  Respiratory: Positive for cough.   Psychiatric/Behavioral: Positive for depression. The patient is nervous/anxious.   All other systems reviewed and are negative.   Blood pressure 121/57, pulse 80, temperature 98.4 F (36.9 C), temperature source Oral, resp. rate 16, height 5' (1.524 m), weight 56.246 kg (124 lb).Body mass index is 24.22 kg/(m^2).  General Appearance: Fairly Groomed  Patent attorney::  Fair  Speech:  Clear and Coherent and Normal Rate  Volume:  Normal  Mood:  Anxious and Euphoric  Affect:  Appropriate and Congruent  Thought Process:  Circumstantial and Linear  Orientation:  Full (Time, Place, and Person)  Thought Content:  Rumination  Suicidal Thoughts:  No  Homicidal Thoughts:  No  Memory:  Immediate;   Fair Recent;   Fair Remote;   Fair  Judgement:  Impaired  Insight:  Lacking  Psychomotor Activity:  Increased  Concentration:  Poor  Recall:  [employer] of Knowledge:Fair  Language: Fair  Akathisia:  No  Handed:  Right  AIMS (if indicated):     Assets:  Communication Skills Desire for Improvement  ADL's:  Intact  Cognition: WNL  Sleep:  Number of Hours: 6.5   Treatment Plan Summary: Daily contact with patient to assess and evaluate symptoms and progress in  treatment and Medication management   Will increase Seroquel to 50 mg po bid as well as 400 mg at bedtime for her mood lability -she was not compliant on her medication. Will continue Trazodone 50 mg po qhs prn for sleep. Will continue to monitor vitals, medication compliance and treatment side effects while patient is here.  Will monitor for medical issues as well as call consult as needed.  [facility], repeat std testing.  CSW will start working on disposition.  Patient to participate in therapeutic milieu .    Malachy Chamber S FNP-BC  09/26/2015, 10:23 AM Agree with Progress Note as above  Nehemiah Massed, MD

## 2015-09-26 NOTE — [facility] Group Notes (Signed)
[facility] Group Notes:  (Nursing/MHT/Case Management/Adjunct)  Date:  09/26/2015  Time:  0900  Type of Therapy:  Nurse Education  Participation Level:  Did Not Attend   Summary of Progress/Problems:  Dara Hoyer 09/26/2015, 10:06 AM

## 2015-09-27 NOTE — Progress Notes (Signed)
Patient ID: [name], female   DOB: August 18, 1961, 54 y.o.   MRN: [id number] PER STATE REGULATIONS 482.30  THIS CHART WAS REVIEWED FOR MEDICAL NECESSITY WITH RESPECT TO THE PATIENT'S ADMISSION/ DURATION OF STAY.  NEXT REVIEW DATE: 09/29/2015  Willa Rough, RN, BSN CASE MANAGER

## 2015-10-05 DIAGNOSIS — F3113 Bipolar disorder, current episode manic without psychotic features, severe: Secondary | ICD-10-CM | POA: Diagnosis not present

## 2015-12-30 DIAGNOSIS — F3113 Bipolar disorder, current episode manic without psychotic features, severe: Secondary | ICD-10-CM | POA: Diagnosis not present

## 2016-02-15 ENCOUNTER — Ambulatory Visit (INDEPENDENT_AMBULATORY_CARE_PROVIDER_SITE_OTHER): Payer: Commercial Managed Care - HMO | Admitting: Family Medicine

## 2016-02-15 ENCOUNTER — Encounter: Payer: Self-pay | Admitting: Family Medicine

## 2016-02-15 ENCOUNTER — Other Ambulatory Visit (HOSPITAL_COMMUNITY)
Admission: RE | Admit: 2016-02-15 | Discharge: 2016-02-15 | Disposition: A | Discharge status: Home / Self Care | Payer: Commercial Managed Care - HMO | Source: Ambulatory Visit | Attending: Family Medicine | Admitting: Family Medicine

## 2016-02-15 VITALS — BP 123/55 | HR 66 | Temp 97.7°F | Resp 14 | Ht 60.0 in | Wt 127.0 lb

## 2016-02-15 DIAGNOSIS — Z1211 Encounter for screening for malignant neoplasm of colon: Secondary | ICD-10-CM | POA: Diagnosis not present

## 2016-02-15 DIAGNOSIS — N760 Acute vaginitis: Secondary | ICD-10-CM | POA: Insufficient documentation

## 2016-02-15 DIAGNOSIS — Z1239 Encounter for other screening for malignant neoplasm of breast: Secondary | ICD-10-CM | POA: Diagnosis not present

## 2016-02-15 DIAGNOSIS — Z113 Encounter for screening for infections with a predominantly sexual mode of transmission: Secondary | ICD-10-CM | POA: Insufficient documentation

## 2016-02-15 DIAGNOSIS — Z01419 Encounter for gynecological examination (general) (routine) without abnormal findings: Secondary | ICD-10-CM | POA: Diagnosis not present

## 2016-02-15 DIAGNOSIS — Z0000 Encounter for general adult medical examination without abnormal findings: Secondary | ICD-10-CM

## 2016-02-15 DIAGNOSIS — F17200 Nicotine dependence, unspecified, uncomplicated: Secondary | ICD-10-CM

## 2016-02-15 LAB — CBC WITH DIFFERENTIAL/PLATELET
BASOS PCT: 1 % (ref 0–1)
Basophils Absolute: 0 10*3/uL (ref 0.0–0.1)
EOS PCT: 4 % (ref 0–5)
Eosinophils Absolute: 0.1 10*3/uL (ref 0.0–0.7)
HEMATOCRIT: 41.9 % (ref 36.0–46.0)
HEMOGLOBIN: 13.5 g/dL (ref 12.0–15.0)
Lymphocytes Relative: 37 % (ref 12–46)
Lymphs Abs: 1.3 10*3/uL (ref 0.7–4.0)
MCH: 29.2 pg (ref 26.0–34.0)
MCHC: 32.2 g/dL (ref 30.0–36.0)
MCV: 90.7 fL (ref 78.0–100.0)
MONO ABS: 0.3 10*3/uL (ref 0.1–1.0)
MPV: 11 fL (ref 8.6–12.4)
Monocytes Relative: 10 % (ref 3–12)
Neutro Abs: 1.6 10*3/uL — ABNORMAL LOW (ref 1.7–7.7)
Neutrophils Relative %: 48 % (ref 43–77)
Platelets: 227 10*3/uL (ref 150–400)
RBC: 4.62 MIL/uL (ref 3.87–5.11)
RDW: 14.9 % (ref 11.5–15.5)
WBC: 3.4 10*3/uL — AB (ref 4.0–10.5)

## 2016-02-15 LAB — COMPLETE METABOLIC PANEL WITH GFR
ALBUMIN: 3.9 g/dL (ref 3.6–5.1)
ALK PHOS: 52 U/L (ref 33–130)
ALT: 24 U/L (ref 6–29)
AST: 19 U/L (ref 10–35)
BILIRUBIN TOTAL: 0.4 mg/dL (ref 0.2–1.2)
BUN: 13 mg/dL (ref 7–25)
CALCIUM: 8.7 mg/dL (ref 8.6–10.4)
CO2: 29 mmol/L (ref 20–31)
CREATININE: 0.93 mg/dL (ref 0.50–1.05)
Chloride: 107 mmol/L (ref 98–110)
GFR, EST AFRICAN AMERICAN: 82 mL/min (ref >=60)
GFR, Est Non African American: 71 mL/min (ref >=60)
Glucose, Bld: 75 mg/dL (ref 65–99)
Potassium: 4.2 mmol/L (ref 3.5–5.3)
Sodium: 139 mmol/L (ref 135–146)
Total Protein: 6.5 g/dL (ref 6.1–8.1)

## 2016-02-15 LAB — POCT URINALYSIS DIP (DEVICE)
Bilirubin Urine: NEGATIVE
GLUCOSE, UA: NEGATIVE mg/dL
Ketones, ur: NEGATIVE mg/dL
LEUKOCYTES UA: NEGATIVE
NITRITE: NEGATIVE
PROTEIN: NEGATIVE mg/dL
Specific Gravity, Urine: 1.02 (ref 1.005–1.030)
UROBILINOGEN UA: 0.2 mg/dL (ref 0.0–1.0)
pH: 5.5 (ref 5.0–8.0)

## 2016-02-15 LAB — LIPID PANEL
CHOLESTEROL: 163 mg/dL (ref 125–200)
HDL: 95 mg/dL (ref >=46)
LDL Cholesterol: 56 mg/dL (ref <130)
TRIGLYCERIDES: 59 mg/dL (ref <150)
Total CHOL/HDL Ratio: 1.7 Ratio (ref <=5.0)
VLDL: 12 mg/dL (ref <30)

## 2016-02-15 NOTE — Progress Notes (Signed)
Subjective:    Patient ID: [name], female    DOB: 05-Oct-1961, 55 y.o.   MRN: [id number]  HPI  Patient presents for an annual physical examination and to establish care. She states that she has never had a primary provider.  She maintains that she has not had a physical in many years. She does not exercise or eat a balanced diet. She smoke 0.5 pack of cigarettes per day and has been for greater than 30 years. She has attempted to quit in the past without success. She has not had a pap smear in greater than 5 years. She is not up to date with vaccinations. She also states that she has never had a colonoscopy.   She reports a history of bipolar disorder and schizophrenia. She is followed by [facility]. She is taking prescribed medications consistently. She denies visual or auditory hallucinations. She also denies suicidal or homicidal intent.   Past Medical History  Diagnosis Date  . Depression   . Bipolar 1 disorder (HCC)   . Drug abuse   . Schizophrenia [facility])    Past Surgical History  Procedure Laterality Date  . No past surgeries    No Known Allergies   There is no immunization history on file for this patient.  Social History   Social History  . Marital Status: Single    Spouse Name: N/A  . Number of Children: N/A  . Years of Education: N/A   Occupational History  . Not on file.   Social History Main Topics  . Smoking status: Current Every Day Smoker -- 0.25 packs/day for 10 years    Types: Cigarettes  . Smokeless tobacco: Never Used  . Alcohol Use: 1.8 oz/week    3 Cans of beer per week     Comment: once a week a 40 oz.   . Drug Use: Yes    Special: Marijuana     Comment: occasionally  . Sexual Activity: Yes    Birth Control/ Protection: Condom   Other Topics Concern  . Not on file   Social History Narrative   There is no immunization history on file for this patient.   Current outpatient prescriptions:  .  acetaminophen (TYLENOL)  500 MG tablet, Take 1,000 mg by mouth every 6 (six) hours as needed for moderate pain., Disp: , Rfl:  .  Multiple Vitamin (MULTIVITAMIN WITH MINERALS) TABS tablet, Take 1 tablet by mouth daily., Disp: 30 tablet, Rfl: 0 .  QUEtiapine (SEROQUEL) 400 MG tablet, Take 1 tablet (400 mg total) by mouth at bedtime., Disp: 30 tablet, Rfl: 0 .  QUEtiapine (SEROQUEL) 50 MG tablet, Take 1 tablet (50 mg total) by mouth daily., Disp: 30 tablet, Rfl: 0 .  hydrOXYzine (ATARAX/VISTARIL) 25 MG tablet, Take 1 tablet (25 mg total) by mouth every 6 (six) hours as needed for anxiety. (Patient not taking: Reported on 05/19/2015), Disp: 30 tablet, Rfl: 0 .  QUEtiapine (SEROQUEL) 300 MG tablet, Take 1 tablet (300 mg total) by mouth at bedtime. (Patient not taking: Reported on 02/15/2016), Disp: 30 tablet, Rfl: 0 Review of Systems  Constitutional: Negative.  Negative for fever and fatigue.  HENT: Negative.   Eyes: Negative.  Negative for photophobia and visual disturbance.  Respiratory: Negative.  Negative for shortness of breath and stridor.   Cardiovascular: Negative.   Gastrointestinal: Negative.   Endocrine: Negative.  Negative for polydipsia, polyphagia and polyuria.  Genitourinary: Negative.   Musculoskeletal: Negative.   Skin: Negative.   Allergic/Immunologic:  Negative.  Negative for immunocompromised state.  Neurological: Negative.  Negative for dizziness, speech difficulty and headaches.  Hematological: Negative.   Psychiatric/Behavioral: Negative.  Negative for suicidal ideas, hallucinations, sleep disturbance and agitation.      Objective:   Physical Exam  Constitutional: She is oriented to person, place, and time. She appears well-developed and well-nourished.  HENT:  Head: Normocephalic and atraumatic.  Right Ear: External ear normal.  Left Ear: External ear normal.  Nose: Nose normal.  Mouth/Throat: Oropharynx is clear and moist. Dental caries present.  Eyes: Conjunctivae and EOM are normal.  Pupils are equal, round, and reactive to light.  Neck: Normal range of motion. Neck supple.  Abdominal: Soft. Bowel sounds are normal.  Musculoskeletal: Normal range of motion.  Neurological: She is alert and oriented to person, place, and time. She has normal reflexes.  Skin: Skin is warm and dry.  Psychiatric: She has a normal mood and affect. Her speech is normal and behavior is normal. Judgment and thought content normal. Thought content is not paranoid and not delusional. She expresses no homicidal and no suicidal ideation.     BP 123/55 mmHg  Pulse 66  Temp(Src) 97.7 F (36.5 C) (Oral)  Resp 14  Ht 5' (1.524 m)  Wt 127 lb (57.607 kg)  BMI 24.80 kg/m2 Assessment & Plan:  1. Annual physical exam Recommend a yearly eye examination Recommend yearly dental visit Recommend monthly self breast examination and will send for screening mammogram.  Also, recommend a screening colonoscopy.  - Lipid Panel - COMPLETE METABOLIC PANEL WITH GFR - CBC with Differential - Hemoglobin A1c - HIV antibody (with reflex) - RPR  2. Encounter for routine gynecological examination - Cytology - PAP [place]  3. Breast cancer screening - MM SCREENING BREAST TOMO BILATERAL; Future  4. Colon cancer screening - Ambulatory referral to Gastroenterology  5. Tobacco dependence Smoking cessation instruction/counseling given:  counseled patient on the dangers of tobacco use, advised patient to stop smoking, and reviewed strategies to maximize success    RTC: Will schedule annual physical exam in 1 year. Will review labs and follow up by telephone.    Massie Maroon, FNP

## 2016-02-16 ENCOUNTER — Telehealth: Payer: Self-pay

## 2016-02-16 ENCOUNTER — Encounter: Payer: Self-pay | Admitting: Gastroenterology

## 2016-02-16 LAB — HEMOGLOBIN A1C
Hgb A1c MFr Bld: 5.9 % — ABNORMAL HIGH (ref <5.7)
Mean Plasma Glucose: 123 mg/dL — ABNORMAL HIGH (ref <117)

## 2016-02-16 LAB — HIV ANTIBODY (ROUTINE TESTING W REFLEX): HIV 1&2 Ab, 4th Generation: NONREACTIVE

## 2016-02-16 LAB — RPR

## 2016-02-16 NOTE — Telephone Encounter (Signed)
Called and left message for patient to call back regarding labs. Thanks!  

## 2016-02-16 NOTE — Telephone Encounter (Signed)
-----   Message from Massie Maroon, [place] sent at 02/16/2016  8:07 AM EST ----- Regarding: lab results Please inform Ms. Micucci that hemoglobin A1C is 5.9. Recommend a lowfat, low carbohydrate diet divided over 5-6 small meals, increase water intake to 6-8 glasses, and 150 minutes per week of cardiovascular exercise. Schedule a follow up appointment in 6 months for prediabetes.   Thanks  ----- Message -----    From: Lab in [facility] Interface    Sent: 02/15/2016   9:40 PM      To: Massie Maroon, FNP

## 2016-02-17 NOTE — Patient Instructions (Signed)
Health Maintenance, Female Adopting a healthy lifestyle and getting preventive care can go a long way to promote health and wellness. Talk with your health care provider about what schedule of regular examinations is right for you. This is a good chance for you to check in with your provider about disease prevention and staying healthy. In between checkups, there are plenty of things you can do on your own. Experts have done a lot of research about which lifestyle changes and preventive measures are most likely to keep you healthy. Ask your health care provider for more information. WEIGHT AND DIET  Eat a healthy diet  Be sure to include plenty of vegetables, fruits, low-fat dairy products, and lean protein.  Do not eat a lot of foods high in solid fats, added sugars, or salt.  Get regular exercise. This is one of the most important things you can do for your health.  Most adults should exercise for at least 150 minutes each week. The exercise should increase your heart rate and make you sweat (moderate-intensity exercise).  Most adults should also do strengthening exercises at least twice a week. This is in addition to the moderate-intensity exercise.  Maintain a healthy weight  Body mass index (BMI) is a measurement that can be used to identify possible weight problems. It estimates body fat based on height and weight. Your health care provider can help determine your BMI and help you achieve or maintain a healthy weight.  For females 20 years of age and older:   A BMI below 18.5 is considered underweight.  A BMI of 18.5 to 24.9 is normal.  A BMI of 25 to 29.9 is considered overweight.  A BMI of 30 and above is considered obese.  Watch levels of cholesterol and blood lipids  You should start having your blood tested for lipids and cholesterol at 53 years of age, then have this test every 5 years.  You may need to have your cholesterol levels checked more often if:  Your lipid  or cholesterol levels are high.  You are older than 53 years of age.  You are at high risk for heart disease.  CANCER SCREENING   Lung Cancer  Lung cancer screening is recommended for adults 55-80 years old who are at high risk for lung cancer because of a history of smoking.  A yearly low-dose CT scan of the lungs is recommended for people who:  Currently smoke.  Have quit within the past 15 years.  Have at least a 30-pack-year history of smoking. A pack year is smoking an average of one pack of cigarettes a day for 1 year.  Yearly screening should continue until it has been 15 years since you quit.  Yearly screening should stop if you develop a health problem that would prevent you from having lung cancer treatment.  Breast Cancer  Practice breast self-awareness. This means understanding how your breasts normally appear and feel.  It also means doing regular breast self-exams. Let your health care provider know about any changes, no matter how small.  If you are in your 20s or 30s, you should have a clinical breast exam (CBE) by a health care provider every 1-3 years as part of a regular health exam.  If you are 40 or older, have a CBE every year. Also consider having a breast X-ray (mammogram) every year.  If you have a family history of breast cancer, talk to your health care provider about genetic screening.  If you   are at high risk for breast cancer, talk to your health care provider about having an MRI and a mammogram every year.  Breast cancer gene (BRCA) assessment is recommended for women who have family members with BRCA-related cancers. BRCA-related cancers include:  Breast.  Ovarian.  Tubal.  Peritoneal cancers.  Results of the assessment will determine the need for genetic counseling and BRCA1 and BRCA2 testing. Cervical Cancer Your health care provider may recommend that you be screened regularly for cancer of the pelvic organs (ovaries, uterus, and  vagina). This screening involves a pelvic examination, including checking for microscopic changes to the surface of your cervix (Pap test). You may be encouraged to have this screening done every 3 years, beginning at age 21.  For women ages 30-65, health care providers may recommend pelvic exams and Pap testing every 3 years, or they may recommend the Pap and pelvic exam, combined with testing for human papilloma virus (HPV), every 5 years. Some types of HPV increase your risk of cervical cancer. Testing for HPV may also be done on women of any age with unclear Pap test results.  Other health care providers may not recommend any screening for nonpregnant women who are considered low risk for pelvic cancer and who do not have symptoms. Ask your health care provider if a screening pelvic exam is right for you.  If you have had past treatment for cervical cancer or a condition that could lead to cancer, you need Pap tests and screening for cancer for at least 20 years after your treatment. If Pap tests have been discontinued, your risk factors (such as having a new sexual partner) need to be reassessed to determine if screening should resume. Some women have medical problems that increase the chance of getting cervical cancer. In these cases, your health care provider may recommend more frequent screening and Pap tests. Colorectal Cancer  This type of cancer can be detected and often prevented.  Routine colorectal cancer screening usually begins at 53 years of age and continues through 53 years of age.  Your health care provider may recommend screening at an earlier age if you have risk factors for colon cancer.  Your health care provider may also recommend using home test kits to check for hidden blood in the stool.  A small camera at the end of a tube can be used to examine your colon directly (sigmoidoscopy or colonoscopy). This is done to check for the earliest forms of colorectal  cancer.  Routine screening usually begins at age 50.  Direct examination of the colon should be repeated every 5-10 years through 53 years of age. However, you may need to be screened more often if early forms of precancerous polyps or small growths are found. Skin Cancer  Check your skin from head to toe regularly.  Tell your health care provider about any new moles or changes in moles, especially if there is a change in a mole's shape or color.  Also tell your health care provider if you have a mole that is larger than the size of a pencil eraser.  Always use sunscreen. Apply sunscreen liberally and repeatedly throughout the day.  Protect yourself by wearing long sleeves, pants, a wide-brimmed hat, and sunglasses whenever you are outside. HEART DISEASE, DIABETES, AND HIGH BLOOD PRESSURE   High blood pressure causes heart disease and increases the risk of stroke. High blood pressure is more likely to develop in:  People who have blood pressure in the high end   of the normal range (130-139/85-89 mm Hg).  People who are overweight or obese.  People who are African American.  If you are 38-23 years of age, have your blood pressure checked every 3-5 years. If you are 61 years of age or older, have your blood pressure checked every year. You should have your blood pressure measured twice--once when you are at a hospital or clinic, and once when you are not at a hospital or clinic. Record the average of the two measurements. To check your blood pressure when you are not at a hospital or clinic, you can use:  An automated blood pressure machine at a pharmacy.  A home blood pressure monitor.  If you are between 45 years and 39 years old, ask your health care provider if you should take aspirin to prevent strokes.  Have regular diabetes screenings. This involves taking a blood sample to check your fasting blood sugar level.  If you are at a normal weight and have a low risk for diabetes,  have this test once every three years after 53 years of age.  If you are overweight and have a high risk for diabetes, consider being tested at a younger age or more often. PREVENTING INFECTION  Hepatitis B  If you have a higher risk for hepatitis B, you should be screened for this virus. You are considered at high risk for hepatitis B if:  You were born in a country where hepatitis B is common. Ask your health care provider which countries are considered high risk.  Your parents were born in a high-risk country, and you have not been immunized against hepatitis B (hepatitis B vaccine).  You have HIV or AIDS.  You use needles to inject street drugs.  You live with someone who has hepatitis B.  You have had sex with someone who has hepatitis B.  You get hemodialysis treatment.  You take certain medicines for conditions, including cancer, organ transplantation, and autoimmune conditions. Hepatitis C  Blood testing is recommended for:  Everyone born from [birth year] through 1963.  Anyone with known risk factors for hepatitis C. Sexually transmitted infections (STIs)  You should be screened for sexually transmitted infections (STIs) including gonorrhea and chlamydia if:  You are sexually active and are younger than 53 years of age.  You are older than 53 years of age and your health care provider tells you that you are at risk for this type of infection.  Your sexual activity has changed since you were last screened and you are at an increased risk for chlamydia or gonorrhea. Ask your health care provider if you are at risk.  If you do not have HIV, but are at risk, it may be recommended that you take a prescription medicine daily to prevent HIV infection. This is called pre-exposure prophylaxis (PrEP). You are considered at risk if:  You are sexually active and do not regularly use condoms or know the HIV status of your partner(s).  You take drugs by injection.  You are sexually  active with a partner who has HIV. Talk with your health care provider about whether you are at high risk of being infected with HIV. If you choose to begin PrEP, you should first be tested for HIV. You should then be tested every 3 months for as long as you are taking PrEP.  PREGNANCY   If you are premenopausal and you may become pregnant, ask your health care provider about preconception counseling.  If you may  are considered at risk if:    You are sexually active and do not regularly use condoms or know the HIV status of your partner(s).    You take drugs by injection.    You are sexually  active with a partner who has HIV.  Talk with your health care provider about whether you are at high risk of being infected with HIV. If you choose to begin PrEP, you should first be tested for HIV. You should then be tested every 3 months for as long as you are taking PrEP.   PREGNANCY    If you are premenopausal and you may become pregnant, ask your health care provider about preconception counseling.   If you may become pregnant, take 400 to 800 micrograms (mcg) of folic acid every day.   If you want to prevent pregnancy, talk to your health care provider about birth control (contraception).  OSTEOPOROSIS AND MENOPAUSE    Osteoporosis is a disease in which the bones lose minerals and strength with aging. This can result in serious bone fractures. Your risk for osteoporosis can be identified using a bone density scan.   If you are 65 years of age or older, or if you are at risk for osteoporosis and fractures, ask your health care provider if you should be screened.   Ask your health care provider whether you should take a calcium or vitamin D supplement to lower your risk for osteoporosis.   Menopause may have certain physical symptoms and risks.   Hormone replacement therapy may reduce some of these symptoms and risks.  Talk to your health care provider about whether hormone replacement therapy is right for you.   HOME CARE INSTRUCTIONS    Schedule regular health, dental, and eye exams.   Stay current with your immunizations.    Do not use any tobacco products including cigarettes, chewing tobacco, or electronic cigarettes.   If you are pregnant, do not drink alcohol.   If you are breastfeeding, limit how much and how often you drink alcohol.   Limit alcohol intake to no more than 1 drink per day for nonpregnant women. One drink equals 12 ounces of beer, 5 ounces of wine, or 1 ounces of hard liquor.   Do not use street drugs.   Do not share needles.   Ask your health care provider for help if  you need support or information about quitting drugs.   Tell your health care provider if you often feel depressed.   Tell your health care provider if you have ever been abused or do not feel safe at home.     This information is not intended to replace advice given to you by your health care provider. Make sure you discuss any questions you have with your health care provider.     Document Released: 06/26/2011 Document Revised: 01/01/2015 Document Reviewed: 11/12/2013  [employer] Interactive Patient Education 2016 [employer]  Preventive Care for Adults, Female  A healthy lifestyle and preventive care can promote health and wellness. Preventive health guidelines for women include the following key practices.   A routine yearly physical is a good way to check with your health care provider about your health and preventive screening. It is a chance to share any concerns and updates on your health and to receive a thorough exam.   Visit your dentist for a routine exam and preventive care every 6 months. Brush your teeth twice a day and floss once a day. Good oral   hygiene prevents tooth decay and gum disease.   The frequency of eye exams is based on your age, health, family medical history, use of contact lenses, and other factors. Follow your health care provider's recommendations for frequency of eye exams.   Eat a healthy diet. Foods like vegetables, fruits, whole grains, low-fat dairy products, and lean protein foods contain the nutrients you need without too many calories. Decrease your intake of foods high in solid fats, added sugars, and salt. Eat the right amount of calories for you.Get information about a proper diet from your health care provider, if necessary.   Regular physical exercise is one of the most important things you can do for your health. Most adults should get at least 150 minutes of moderate-intensity exercise (any activity that increases your heart rate and causes you to sweat) each  week. In addition, most adults need muscle-strengthening exercises on 2 or more days a week.   Maintain a healthy weight. The body mass index (BMI) is a screening tool to identify possible weight problems. It provides an estimate of body fat based on height and weight. Your health care provider can find your BMI and can help you achieve or maintain a healthy weight.For adults 20 years and older:    A BMI below 18.5 is considered underweight.    A BMI of 18.5 to 24.9 is normal.    A BMI of 25 to 29.9 is considered overweight.    A BMI of 30 and above is considered obese.   Maintain normal blood lipids and cholesterol levels by exercising and minimizing your intake of saturated fat. Eat a balanced diet with plenty of fruit and vegetables. Blood tests for lipids and cholesterol should begin at age 20 and be repeated every 5 years. If your lipid or cholesterol levels are high, you are over 50, or you are at high risk for heart disease, you may need your cholesterol levels checked more frequently.Ongoing high lipid and cholesterol levels should be treated with medicines if diet and exercise are not working.   If you smoke, find out from your health care provider how to quit. If you do not use tobacco, do not start.   Lung cancer screening is recommended for adults aged 55-80 years who are at high risk for developing lung cancer because of a history of smoking. A yearly low-dose CT scan of the lungs is recommended for people who have at least a 30-pack-year history of smoking and are a current smoker or have quit within the past 15 years. A pack year of smoking is smoking an average of 1 pack of cigarettes a day for 1 year (for example: 1 pack a day for 30 years or 2 packs a day for 15 years). Yearly screening should continue until the smoker has stopped smoking for at least 15 years. Yearly screening should be stopped for people who develop a health problem that would prevent them from having lung cancer  treatment.   If you are pregnant, do not drink alcohol. If you are breastfeeding, be very cautious about drinking alcohol. If you are not pregnant and choose to drink alcohol, do not have more than 1 drink per day. One drink is considered to be 12 ounces (355 mL) of beer, 5 ounces (148 mL) of wine, or 1.5 ounces (44 mL) of liquor.   Avoid use of street drugs. Do not share needles with anyone. Ask for help if you need support or instructions about stopping the use   of drugs.   High blood pressure causes heart disease and increases the risk of stroke. Your blood pressure should be checked at least every 1 to 2 years. Ongoing high blood pressure should be treated with medicines if weight loss and exercise do not work.   If you are 55-79 years old, ask your health care provider if you should take aspirin to prevent strokes.   Diabetes screening is done by taking a blood sample to check your blood glucose level after you have not eaten for a certain period of time (fasting). If you are not overweight and you do not have risk factors for diabetes, you should be screened once every 3 years starting at age 45. If you are overweight or obese and you are 40-70 years of age, you should be screened for diabetes every year as part of your cardiovascular risk assessment.   Breast cancer screening is essential preventive care for women. You should practice "breast self-awareness." This means understanding the normal appearance and feel of your breasts and may include breast self-examination. Any changes detected, no matter how small, should be reported to a health care provider. Women in their 20s and 30s should have a clinical breast exam (CBE) by a health care provider as part of a regular health exam every 1 to 3 years. After age 40, women should have a CBE every year. Starting at age 40, women should consider having a mammogram (breast X-ray test) every year. Women who have a family history of breast cancer should talk to  their health care provider about genetic screening. Women at a high risk of breast cancer should talk to their health care providers about having an MRI and a mammogram every year.   Breast cancer gene (BRCA)-related cancer risk assessment is recommended for women who have family members with BRCA-related cancers. BRCA-related cancers include breast, ovarian, tubal, and peritoneal cancers. Having family members with these cancers may be associated with an increased risk for harmful changes (mutations) in the breast cancer genes BRCA1 and BRCA2. Results of the assessment will determine the need for genetic counseling and BRCA1 and BRCA2 testing.   Your health care provider may recommend that you be screened regularly for cancer of the pelvic organs (ovaries, uterus, and vagina). This screening involves a pelvic examination, including checking for microscopic changes to the surface of your cervix (Pap test). You may be encouraged to have this screening done every 3 years, beginning at age 21.    For women ages 30-65, health care providers may recommend pelvic exams and Pap testing every 3 years, or they may recommend the Pap and pelvic exam, combined with testing for human papilloma virus (HPV), every 5 years. Some types of HPV increase your risk of cervical cancer. Testing for HPV may also be done on women of any age with unclear Pap test results.    Other health care providers may not recommend any screening for nonpregnant women who are considered low risk for pelvic cancer and who do not have symptoms. Ask your health care provider if a screening pelvic exam is right for you.   If you have had past treatment for cervical cancer or a condition that could lead to cancer, you need Pap tests and screening for cancer for at least 20 years after your treatment. If Pap tests have been discontinued, your risk factors (such as having a new sexual partner) need to be reassessed to determine if screening should resume.  Some women have medical   problems that increase the chance of getting cervical cancer. In these cases, your health care provider may recommend more frequent screening and Pap tests.   Colorectal cancer can be detected and often prevented. Most routine colorectal cancer screening begins at the age of 50 years and continues through age 75 years. However, your health care provider may recommend screening at an earlier age if you have risk factors for colon cancer. On a yearly basis, your health care provider may provide home test kits to check for hidden blood in the stool. Use of a small camera at the end of a tube, to directly examine the colon (sigmoidoscopy or colonoscopy), can detect the earliest forms of colorectal cancer. Talk to your health care provider about this at age 50, when routine screening begins. Direct exam of the colon should be repeated every 5-10 years through age 75 years, unless early forms of precancerous polyps or small growths are found.   People who are at an increased risk for hepatitis B should be screened for this virus. You are considered at high risk for hepatitis B if:    You were born in a country where hepatitis B occurs often. Talk with your health care provider about which countries are considered high risk.    Your parents were born in a high-risk country and you have not received a shot to protect against hepatitis B (hepatitis B vaccine).    You have HIV or AIDS.    You use needles to inject street drugs.    You live with, or have sex with, someone who has hepatitis B.    You get hemodialysis treatment.    You take certain medicines for conditions like cancer, organ transplantation, and autoimmune conditions.   Hepatitis C blood testing is recommended for all people born from 1945 through 1965 and any individual with known risks for hepatitis C.   Practice safe sex. Use condoms and avoid high-risk sexual practices to reduce the spread of sexually transmitted infections  (STIs). STIs include gonorrhea, chlamydia, syphilis, trichomonas, herpes, HPV, and human immunodeficiency virus (HIV). Herpes, HIV, and HPV are viral illnesses that have no cure. They can result in disability, cancer, and death.   You should be screened for sexually transmitted illnesses (STIs) including gonorrhea and chlamydia if:    You are sexually active and are younger than 24 years.    You are older than 24 years and your health care provider tells you that you are at risk for this type of infection.    Your sexual activity has changed since you were last screened and you are at an increased risk for chlamydia or gonorrhea. Ask your health care provider if you are at risk.   If you are at risk of being infected with HIV, it is recommended that you take a prescription medicine daily to prevent HIV infection. This is called preexposure prophylaxis (PrEP). You are considered at risk if:    You are sexually active and do not regularly use condoms or know the HIV status of your partner(s).    You take drugs by injection.    You are sexually active with a partner who has HIV.    Talk with your health care provider about whether you are at high risk of being infected with HIV. If you choose to begin PrEP, you should first be tested for HIV. You should then be tested every 3 months for as long as you are taking PrEP.   Osteoporosis is   a disease in which the bones lose minerals and strength with aging. This can result in serious bone fractures or breaks. The risk of osteoporosis can be identified using a bone density scan. Women ages 65 years and over and women at risk for fractures or osteoporosis should discuss screening with their health care providers. Ask your health care provider whether you should take a calcium supplement or vitamin D to reduce the rate of osteoporosis.   Menopause can be associated with physical symptoms and risks. Hormone replacement therapy is available to decrease symptoms and risks.  You should talk to your health care provider about whether hormone replacement therapy is right for you.   Use sunscreen. Apply sunscreen liberally and repeatedly throughout the day. You should seek shade when your shadow is shorter than you. Protect yourself by wearing long sleeves, pants, a wide-brimmed hat, and sunglasses year round, whenever you are outdoors.   Once a month, do a whole body skin exam, using a mirror to look at the skin on your back. Tell your health care provider of new moles, moles that have irregular borders, moles that are larger than a pencil eraser, or moles that have changed in shape or color.   Stay current with required vaccines (immunizations).    Influenza vaccine. All adults should be immunized every year.    Tetanus, diphtheria, and acellular pertussis (Td, Tdap) vaccine. Pregnant women should receive 1 dose of Tdap vaccine during each pregnancy. The dose should be obtained regardless of the length of time since the last dose. Immunization is preferred during the 27th-36th week of gestation. An adult who has not previously received Tdap or who does not know her vaccine status should receive 1 dose of Tdap. This initial dose should be followed by tetanus and diphtheria toxoids (Td) booster doses every 10 years. Adults with an unknown or incomplete history of completing a 3-dose immunization series with Td-containing vaccines should begin or complete a primary immunization series including a Tdap dose. Adults should receive a Td booster every 10 years.    Varicella vaccine. An adult without evidence of immunity to varicella should receive 2 doses or a second dose if she has previously received 1 dose. Pregnant females who do not have evidence of immunity should receive the first dose after pregnancy. This first dose should be obtained before leaving the health care facility. The second dose should be obtained 4-8 weeks after the first dose.    Human papillomavirus (HPV) vaccine.  Females aged 13-26 years who have not received the vaccine previously should obtain the 3-dose series. The vaccine is not recommended for use in pregnant females. However, pregnancy testing is not needed before receiving a dose. If a female is found to be pregnant after receiving a dose, no treatment is needed. In that case, the remaining doses should be delayed until after the pregnancy. Immunization is recommended for any person with an immunocompromised condition through the age of 26 years if she did not get any or all doses earlier. During the 3-dose series, the second dose should be obtained 4-8 weeks after the first dose. The third dose should be obtained 24 weeks after the first dose and 16 weeks after the second dose.    Zoster vaccine. One dose is recommended for adults aged 60 years or older unless certain conditions are present.    Measles, mumps, and rubella (MMR) vaccine. Adults born before 1957 generally are considered immune to measles and mumps. Adults born in 1957 or   later should have 1 or more doses of MMR vaccine unless there is a contraindication to the vaccine or there is laboratory evidence of immunity to each of the three diseases. A routine second dose of MMR vaccine should be obtained at least 28 days after the first dose for students attending postsecondary schools, health care workers, or international travelers. People who received inactivated measles vaccine or an unknown type of measles vaccine during 1963-1967 should receive 2 doses of MMR vaccine. People who received inactivated mumps vaccine or an unknown type of mumps vaccine before 1979 and are at high risk for mumps infection should consider immunization with 2 doses of MMR vaccine. For females of childbearing age, rubella immunity should be determined. If there is no evidence of immunity, females who are not pregnant should be vaccinated. If there is no evidence of immunity, females who are pregnant should delay immunization  until after pregnancy. Unvaccinated health care workers born before 1957 who lack laboratory evidence of measles, mumps, or rubella immunity or laboratory confirmation of disease should consider measles and mumps immunization with 2 doses of MMR vaccine or rubella immunization with 1 dose of MMR vaccine.    Pneumococcal 13-valent conjugate (PCV13) vaccine. When indicated, a person who is uncertain of his immunization history and has no record of immunization should receive the PCV13 vaccine. All adults 65 years of age and older should receive this vaccine. An adult aged 19 years or older who has certain medical conditions and has not been previously immunized should receive 1 dose of PCV13 vaccine. This PCV13 should be followed with a dose of pneumococcal polysaccharide (PPSV23) vaccine. Adults who are at high risk for pneumococcal disease should obtain the PPSV23 vaccine at least 8 weeks after the dose of PCV13 vaccine. Adults older than 53 years of age who have normal immune system function should obtain the PPSV23 vaccine dose at least 1 year after the dose of PCV13 vaccine.    Pneumococcal polysaccharide (PPSV23) vaccine. When PCV13 is also indicated, PCV13 should be obtained first. All adults aged 65 years and older should be immunized. An adult younger than age 65 years who has certain medical conditions should be immunized. Any person who resides in a nursing home or long-term care facility should be immunized. An adult smoker should be immunized. People with an immunocompromised condition and certain other conditions should receive both PCV13 and PPSV23 vaccines. People with human immunodeficiency virus (HIV) infection should be immunized as soon as possible after diagnosis. Immunization during chemotherapy or radiation therapy should be avoided. Routine use of PPSV23 vaccine is not recommended for American Indians, [place] Natives, or people younger than 65 years unless there are medical conditions that  require PPSV23 vaccine. When indicated, people who have unknown immunization and have no record of immunization should receive PPSV23 vaccine. One-time revaccination 5 years after the first dose of PPSV23 is recommended for people aged 19-64 years who have chronic kidney failure, nephrotic syndrome, asplenia, or immunocompromised conditions. People who received 1-2 doses of PPSV23 before age 65 years should receive another dose of PPSV23 vaccine at age 65 years or later if at least 5 years have passed since the previous dose. Doses of PPSV23 are not needed for people immunized with PPSV23 at or after age 65 years.    Meningococcal vaccine. Adults with asplenia or persistent complement component deficiencies should receive 2 doses of quadrivalent meningococcal conjugate (MenACWY-D) vaccine. The doses should be obtained at least 2 months apart. Microbiologists working with certain   meningococcal bacteria, military recruits, people at risk during an outbreak, and people who travel to or live in countries with a high rate of meningitis should be immunized. A first-year college student up through age 21 years who is living in a residence hall should receive a dose if she did not receive a dose on or after her 16th birthday. Adults who have certain high-risk conditions should receive one or more doses of vaccine.    Hepatitis A vaccine. Adults who wish to be protected from this disease, have certain high-risk conditions, work with hepatitis A-infected animals, work in hepatitis A research labs, or travel to or work in countries with a high rate of hepatitis A should be immunized. Adults who were previously unvaccinated and who anticipate close contact with an international adoptee during the first 60 days after arrival in the [place] from a country with a high rate of hepatitis A should be immunized.    Hepatitis B vaccine. Adults who wish to be protected from this disease, have certain high-risk conditions, may be  exposed to blood or other infectious body fluids, are household contacts or sex partners of hepatitis B positive people, are clients or workers in certain care facilities, or travel to or work in countries with a high rate of hepatitis B should be immunized.    Haemophilus influenzae type b (Hib) vaccine. A previously unvaccinated person with asplenia or sickle cell disease or having a scheduled splenectomy should receive 1 dose of Hib vaccine. Regardless of previous immunization, a recipient of a hematopoietic stem cell transplant should receive a 3-dose series 6-12 months after her successful transplant. Hib vaccine is not recommended for adults with HIV infection.  Preventive Services / Frequency  Ages 19 to 39 years   Blood pressure check.** / Every 3-5 years.   Lipid and cholesterol check.** / Every 5 years beginning at age 20.   Clinical breast exam.** / Every 3 years for women in their 20s and 30s.   BRCA-related cancer risk assessment.** / For women who have family members with a BRCA-related cancer (breast, ovarian, tubal, or peritoneal cancers).   Pap test.** / Every 2 years from ages 21 through 29. Every 3 years starting at age 30 through age 65 or 70 with a history of 3 consecutive normal Pap tests.   HPV screening.** / Every 3 years from ages 30 through ages 65 to 70 with a history of 3 consecutive normal Pap tests.   Hepatitis C blood test.** / For any individual with known risks for hepatitis C.   Skin self-exam. / Monthly.   Influenza vaccine. / Every year.   Tetanus, diphtheria, and acellular pertussis (Tdap, Td) vaccine.** / Consult your health care provider. Pregnant women should receive 1 dose of Tdap vaccine during each pregnancy. 1 dose of Td every 10 years.   Varicella vaccine.** / Consult your health care provider. Pregnant females who do not have evidence of immunity should receive the first dose after pregnancy.   HPV vaccine. / 3 doses over 6 months, if 26 and younger. The  vaccine is not recommended for use in pregnant females. However, pregnancy testing is not needed before receiving a dose.   Measles, mumps, rubella (MMR) vaccine.** / You need at least 1 dose of MMR if you were born in 1957 or later. You may also need a 2nd dose. For females of childbearing age, rubella immunity should be determined. If there is no evidence of immunity, females who are not pregnant   should be vaccinated. If there is no evidence of immunity, females who are pregnant should delay immunization until after pregnancy.   Pneumococcal 13-valent conjugate (PCV13) vaccine.** / Consult your health care provider.   Pneumococcal polysaccharide (PPSV23) vaccine.** / 1 to 2 doses if you smoke cigarettes or if you have certain conditions.   Meningococcal vaccine.** / 1 dose if you are age 19 to 21 years and a first-year college student living in a residence hall, or have one of several medical conditions, you need to get vaccinated against meningococcal disease. You may also need additional booster doses.   Hepatitis A vaccine.** / Consult your health care provider.   Hepatitis B vaccine.** / Consult your health care provider.   Haemophilus influenzae type b (Hib) vaccine.** / Consult your health care provider.  Ages 40 to 64 years   Blood pressure check.** / Every year.   Lipid and cholesterol check.** / Every 5 years beginning at age 20 years.   Lung cancer screening. / Every year if you are aged 55-80 years and have a 30-pack-year history of smoking and currently smoke or have quit within the past 15 years. Yearly screening is stopped once you have quit smoking for at least 15 years or develop a health problem that would prevent you from having lung cancer treatment.   Clinical breast exam.** / Every year after age 40 years.   BRCA-related cancer risk assessment.** / For women who have family members with a BRCA-related cancer (breast, ovarian, tubal, or peritoneal cancers).   Mammogram.** / Every  year beginning at age 40 years and continuing for as long as you are in good health. Consult with your health care provider.   Pap test.** / Every 3 years starting at age 30 years through age 65 or 70 years with a history of 3 consecutive normal Pap tests.   HPV screening.** / Every 3 years from ages 30 years through ages 65 to 70 years with a history of 3 consecutive normal Pap tests.   Fecal occult blood test (FOBT) of stool. / Every year beginning at age 50 years and continuing until age 75 years. You may not need to do this test if you get a colonoscopy every 10 years.   Flexible sigmoidoscopy or colonoscopy.** / Every 5 years for a flexible sigmoidoscopy or every 10 years for a colonoscopy beginning at age 50 years and continuing until age 75 years.   Hepatitis C blood test.** / For all people born from 1945 through 1965 and any individual with known risks for hepatitis C.   Skin self-exam. / Monthly.   Influenza vaccine. / Every year.   Tetanus, diphtheria, and acellular pertussis (Tdap/Td) vaccine.** / Consult your health care provider. Pregnant women should receive 1 dose of Tdap vaccine during each pregnancy. 1 dose of Td every 10 years.   Varicella vaccine.** / Consult your health care provider. Pregnant females who do not have evidence of immunity should receive the first dose after pregnancy.   Zoster vaccine.** / 1 dose for adults aged 60 years or older.   Measles, mumps, rubella (MMR) vaccine.** / You need at least 1 dose of MMR if you were born in 1957 or later. You may also need a second dose. For females of childbearing age, rubella immunity should be determined. If there is no evidence of immunity, females who are not pregnant should be vaccinated. If there is no evidence of immunity, females who are pregnant should delay immunization until after pregnancy.     Pneumococcal 13-valent conjugate (PCV13) vaccine.** / Consult your health care provider.   Pneumococcal polysaccharide (PPSV23)  vaccine.** / 1 to 2 doses if you smoke cigarettes or if you have certain conditions.   Meningococcal vaccine.** / Consult your health care provider.   Hepatitis A vaccine.** / Consult your health care provider.   Hepatitis B vaccine.** / Consult your health care provider.   Haemophilus influenzae type b (Hib) vaccine.** / Consult your health care provider.  Ages 65 years and over   Blood pressure check.** / Every year.   Lipid and cholesterol check.** / Every 5 years beginning at age 20 years.   Lung cancer screening. / Every year if you are aged 55-80 years and have a 30-pack-year history of smoking and currently smoke or have quit within the past 15 years. Yearly screening is stopped once you have quit smoking for at least 15 years or develop a health problem that would prevent you from having lung cancer treatment.   Clinical breast exam.** / Every year after age 40 years.   BRCA-related cancer risk assessment.** / For women who have family members with a BRCA-related cancer (breast, ovarian, tubal, or peritoneal cancers).   Mammogram.** / Every year beginning at age 40 years and continuing for as long as you are in good health. Consult with your health care provider.   Pap test.** / Every 3 years starting at age 30 years through age 65 or 70 years with 3 consecutive normal Pap tests. Testing can be stopped between 65 and 70 years with 3 consecutive normal Pap tests and no abnormal Pap or HPV tests in the past 10 years.   HPV screening.** / Every 3 years from ages 30 years through ages 65 or 70 years with a history of 3 consecutive normal Pap tests. Testing can be stopped between 65 and 70 years with 3 consecutive normal Pap tests and no abnormal Pap or HPV tests in the past 10 years.   Fecal occult blood test (FOBT) of stool. / Every year beginning at age 50 years and continuing until age 75 years. You may not need to do this test if you get a colonoscopy every 10 years.   Flexible sigmoidoscopy  or colonoscopy.** / Every 5 years for a flexible sigmoidoscopy or every 10 years for a colonoscopy beginning at age 50 years and continuing until age 75 years.   Hepatitis C blood test.** / For all people born from 1945 through 1965 and any individual with known risks for hepatitis C.   Osteoporosis screening.** / A one-time screening for women ages 65 years and over and women at risk for fractures or osteoporosis.   Skin self-exam. / Monthly.   Influenza vaccine. / Every year.   Tetanus, diphtheria, and acellular pertussis (Tdap/Td) vaccine.** / 1 dose of Td every 10 years.   Varicella vaccine.** / Consult your health care provider.   Zoster vaccine.** / 1 dose for adults aged 60 years or older.   Pneumococcal 13-valent conjugate (PCV13) vaccine.** / Consult your health care provider.   Pneumococcal polysaccharide (PPSV23) vaccine.** / 1 dose for all adults aged 65 years and older.   Meningococcal vaccine.** / Consult your health care provider.   Hepatitis A vaccine.** / Consult your health care provider.   Hepatitis B vaccine.** / Consult your health care provider.   Haemophilus influenzae type b (Hib) vaccine.** / Consult your health care provider.  ** Family history and personal history of risk and conditions may change your

## 2016-02-17 NOTE — Telephone Encounter (Signed)
Have not been able to reach patient by phone. Will send letter. Thanks!

## 2016-02-18 LAB — CERVICOVAGINAL ANCILLARY ONLY: BACTERIAL VAGINITIS: NEGATIVE

## 2016-02-18 LAB — CYTOLOGY - PAP

## 2016-03-03 ENCOUNTER — Ambulatory Visit: Payer: Self-pay

## 2016-04-10 ENCOUNTER — Encounter: Payer: Self-pay | Admitting: Gastroenterology

## 2016-05-16 ENCOUNTER — Emergency Department (HOSPITAL_COMMUNITY)
Admission: EM | Admit: 2016-05-16 | Discharge: 2016-05-17 | Disposition: A | Discharge status: Home / Self Care | Payer: Medicare Other | Attending: Emergency Medicine | Admitting: Emergency Medicine

## 2016-05-16 ENCOUNTER — Encounter (HOSPITAL_COMMUNITY): Payer: Self-pay | Admitting: Emergency Medicine

## 2016-05-16 DIAGNOSIS — F319 Bipolar disorder, unspecified: Secondary | ICD-10-CM | POA: Diagnosis not present

## 2016-05-16 DIAGNOSIS — F17210 Nicotine dependence, cigarettes, uncomplicated: Secondary | ICD-10-CM | POA: Insufficient documentation

## 2016-05-16 DIAGNOSIS — Z046 Encounter for general psychiatric examination, requested by authority: Secondary | ICD-10-CM | POA: Insufficient documentation

## 2016-05-16 DIAGNOSIS — R443 Hallucinations, unspecified: Secondary | ICD-10-CM | POA: Diagnosis not present

## 2016-05-16 DIAGNOSIS — Z79899 Other long term (current) drug therapy: Secondary | ICD-10-CM | POA: Diagnosis not present

## 2016-05-16 DIAGNOSIS — R45851 Suicidal ideations: Secondary | ICD-10-CM | POA: Insufficient documentation

## 2016-05-16 LAB — ACETAMINOPHEN LEVEL: Acetaminophen (Tylenol), Serum: <10 ug/mL — ABNORMAL LOW (ref 10–30)

## 2016-05-16 LAB — CBC
HEMATOCRIT: 44.4 % (ref 36.0–46.0)
HEMOGLOBIN: 15.2 g/dL — AB (ref 12.0–15.0)
MCH: 29.9 pg (ref 26.0–34.0)
MCHC: 34.2 g/dL (ref 30.0–36.0)
MCV: 87.4 fL (ref 78.0–100.0)
Platelets: 252 10*3/uL (ref 150–400)
RBC: 5.08 MIL/uL (ref 3.87–5.11)
RDW: 14.6 % (ref 11.5–15.5)
WBC: 5.5 10*3/uL (ref 4.0–10.5)

## 2016-05-16 LAB — COMPREHENSIVE METABOLIC PANEL
ALBUMIN: 4.3 g/dL (ref 3.5–5.0)
ALT: 16 U/L (ref 14–54)
AST: 21 U/L (ref 15–41)
Alkaline Phosphatase: 60 U/L (ref 38–126)
Anion gap: 5 (ref 5–15)
BILIRUBIN TOTAL: 0.6 mg/dL (ref 0.3–1.2)
BUN: 15 mg/dL (ref 6–20)
CALCIUM: 9.8 mg/dL (ref 8.9–10.3)
CO2: 28 mmol/L (ref 22–32)
CREATININE: 1.03 mg/dL — AB (ref 0.44–1.00)
Chloride: 107 mmol/L (ref 101–111)
GFR calc non Af Amer: >60 mL/min (ref >60)
GLUCOSE: 103 mg/dL — AB (ref 65–99)
Potassium: 4.3 mmol/L (ref 3.5–5.1)
Sodium: 140 mmol/L (ref 135–145)
Total Protein: 7.3 g/dL (ref 6.5–8.1)

## 2016-05-16 LAB — ETHANOL: Alcohol, Ethyl (B): <5 mg/dL (ref <5)

## 2016-05-16 LAB — SALICYLATE LEVEL

## 2016-05-16 NOTE — ED Provider Notes (Signed)
CSN: [id number]     Arrival date & time 05/16/16  2023 History   First MD Initiated Contact with Patient 05/16/16 2119     Chief Complaint  Patient presents with  . Medical Clearance  . Suicidal     (Consider location/radiation/quality/duration/timing/severity/associated sxs/prior Treatment) HPI   Patient is a 55 year old female with past medical history of depression, bipolar and schizophrenia presents to the ED with suicidal ideation. Patient reports having a history of cocaine abuse and notes she last used a week ago. She states while using cocaine she stops taking her psych medication including Seroquel. Patient notes she has not been on Seroquel for the past week. She notes she last used cocaine yesterday. Patient endorses having suicide ideation with plan of overdosing on medications or overdosing on cocaine. Endorses visual and auditory hallucinations. She reports hearing voices telling her to hurt herself however patient denies going through with any of these command hallucinations. Patient endorses consuming alcohol intermittently, she notes she had one beer yesterday. Endorses smoking marijuana, denies any other drug use. Denies homicidal ideation. Patient reports that she has been more anxious and has not been sleeping, eating or drinking water for the past 4 days. Denies any pain or complaints at this time.   Past Medical History  Diagnosis Date  . Depression   . Bipolar 1 disorder (HCC)   . Drug abuse   . Schizophrenia [facility])    Past Surgical History  Procedure Laterality Date  . No past surgeries     Family History  Problem Relation Age of Onset  . Anesthesia problems Neg Hx   . Hypotension Neg Hx   . Malignant hyperthermia Neg Hx   . Pseudochol deficiency Neg Hx   . Mental illness Maternal Aunt   . Cancer Maternal Aunt     breast    Social History  Substance Use Topics  . Smoking status: Current Every Day Smoker -- 0.25 packs/day for 10 years    Types: Cigarettes   . Smokeless tobacco: Never Used  . Alcohol Use: 1.8 oz/week    3 Cans of beer per week     Comment: once a week a 40 oz.    OB History    No data available     Review of Systems  Psychiatric/Behavioral: Positive for suicidal ideas, hallucinations and sleep disturbance. The patient is nervous/anxious.   All other systems reviewed and are negative.     Allergies  Review of patient's allergies indicates no known allergies.  Home Medications   Prior to Admission medications   Medication Sig Start Date End Date Taking? Authorizing Provider  acetaminophen (TYLENOL) 500 MG tablet Take 1,000 mg by mouth every 6 (six) hours as needed for moderate pain.   Yes Historical Provider, MD  QUEtiapine (SEROQUEL) 400 MG tablet Take 1 tablet (400 mg total) by mouth at bedtime. 09/26/15  Yes Truman Hayward, FNP  QUEtiapine (SEROQUEL) 50 MG tablet Take 1 tablet (50 mg total) by mouth daily. 09/26/15  Yes Truman Hayward, FNP  Multiple Vitamin (MULTIVITAMIN WITH MINERALS) TABS tablet Take 1 tablet by mouth daily. Patient not taking: Reported on 05/16/2016 01/14/15   Adonis Brook, NP  QUEtiapine (SEROQUEL) 300 MG tablet Take 1 tablet (300 mg total) by mouth at bedtime. Patient not taking: Reported on 05/16/2016 05/19/15   Azalia Bilis, MD   BP 109/64 mmHg  Pulse 88  Temp(Src) 98.6 F (37 C) (Oral)  Resp 20  Ht 5' (1.524 m)  Wt [phone]  kg  BMI 23.44 kg/m2  SpO2 98% Physical Exam  Constitutional: She is oriented to person, place, and time. She appears well-developed and well-nourished.  HENT:  Head: Normocephalic and atraumatic.  Mouth/Throat: Oropharynx is clear and moist. No oropharyngeal exudate.  Eyes: Conjunctivae and EOM are normal. Pupils are equal, round, and reactive to light. Right eye exhibits no discharge. Left eye exhibits no discharge. No scleral icterus.  Neck: Normal range of motion. Neck supple.  Cardiovascular: Normal rate, regular rhythm, normal heart sounds and intact distal  pulses.   Pulmonary/Chest: Effort normal and breath sounds normal. No respiratory distress. She has no wheezes. She has no rales. She exhibits no tenderness.  Abdominal: Soft. Bowel sounds are normal. She exhibits no distension and no mass. There is no tenderness. There is no rebound and no guarding.  Musculoskeletal: Normal range of motion. She exhibits no edema.  Lymphadenopathy:    She has no cervical adenopathy.  Neurological: She is alert and oriented to person, place, and time.  Skin: Skin is warm and dry. She is not diaphoretic.  Psychiatric: Her speech is normal. Her affect is inappropriate. She is hyperactive and actively hallucinating. Cognition and memory are impaired. She expresses inappropriate judgment. She expresses suicidal ideation. She expresses no homicidal ideation. She expresses suicidal plans. She expresses no homicidal plans.  Nursing note and vitals reviewed.   ED Course  Procedures (including critical care time) Labs Review Labs Reviewed  COMPREHENSIVE METABOLIC PANEL - Abnormal; Notable for the following:    Glucose, Bld 103 (*)    Creatinine, Ser 1.03 (*)    All other components within normal limits  ACETAMINOPHEN LEVEL - Abnormal; Notable for the following:    Acetaminophen (Tylenol), Serum <10 (*)    All other components within normal limits  CBC - Abnormal; Notable for the following:    Hemoglobin 15.2 (*)    All other components within normal limits  ETHANOL  SALICYLATE LEVEL  URINE RAPID DRUG SCREEN, HOSP PERFORMED    Imaging Review No results found. I have personally reviewed and evaluated these images and lab results as part of my medical decision-making.   EKG Interpretation   Date/Time:  Wednesday May 17 2016 00:11:29 EDT Ventricular Rate:  72 PR Interval:  113 QRS Duration: 84 QT Interval:  418 QTC Calculation: 457 R Axis:   -40 Text Interpretation:  Sinus rhythm Borderline short PR interval Left axis  deviation Abnormal R-wave  progression, late transition Borderline T  abnormalities, anterior leads Baseline wander in lead(s) I II aVR Since  last tracing Nonspecific T wave abnormality Confirmed by Effie ShyWENTZ  MD,  ELLIOTT ([id number]) on 05/17/2016 12:44:36 AM      MDM   Final diagnoses:  None    Patient presents with suicide ideation, visual and auditory hallucinations, anxiety and insomnia. Patient reports history of depression and schizophrenia but denies taking her serum quell. She endorses cocaine use, last use yesterday. Denies chest pain. VSS. Physical exam revealed an appropriate affect, hyperactive behavior, active hallucinations, inappropriate judgment and suicide ideation with plan. Remaining exam unremarkable. EKG showed sinus rhythm with nonspecific T wave abnormality. Labs unremarkable. Patient medically cleared. Consulted TTS. Dispo pending.     Satira Sarkicole Elizabeth BoltonNadeau, [place] JerseyPA-C 05/17/16 0128  Mancel BaleElliott Wentz, MD 05/17/16 1228

## 2016-05-16 NOTE — ED Notes (Addendum)
Pt from home via a friend with complaints of SI and auditory hallucinations of whispers. Pt denies any HI. Pt states that she wants help detoxing from cocaine. Pt states she has been off her seroquel x 4 days because she ran out and is now having passive thoughts of hurting her self. Pt states she had a plan to drink etoh and consume cocaine until she died. Pt is laughing while stating her plan. Pt is cooperative at time of assessment. Pt denies any changes at home or changes in her support system. However, when asked later in assessment, pt denies both SI and HI

## 2016-05-17 ENCOUNTER — Inpatient Hospital Stay (HOSPITAL_COMMUNITY)
Admission: AD | Admit: 2016-05-17 | Discharge: 2016-05-21 | DRG: 885 | Disposition: A | Discharge status: Home / Self Care | Payer: Medicare Other | Source: Intra-hospital | Attending: Psychiatry | Admitting: Psychiatry

## 2016-05-17 ENCOUNTER — Encounter (HOSPITAL_COMMUNITY): Payer: Self-pay

## 2016-05-17 DIAGNOSIS — F1220 Cannabis dependence, uncomplicated: Secondary | ICD-10-CM | POA: Diagnosis not present

## 2016-05-17 DIAGNOSIS — F250 Schizoaffective disorder, bipolar type: Secondary | ICD-10-CM | POA: Diagnosis present

## 2016-05-17 DIAGNOSIS — F1210 Cannabis abuse, uncomplicated: Secondary | ICD-10-CM | POA: Diagnosis present

## 2016-05-17 DIAGNOSIS — F17210 Nicotine dependence, cigarettes, uncomplicated: Secondary | ICD-10-CM | POA: Diagnosis present

## 2016-05-17 DIAGNOSIS — F319 Bipolar disorder, unspecified: Secondary | ICD-10-CM

## 2016-05-17 DIAGNOSIS — R443 Hallucinations, unspecified: Secondary | ICD-10-CM | POA: Insufficient documentation

## 2016-05-17 DIAGNOSIS — R45851 Suicidal ideations: Secondary | ICD-10-CM | POA: Diagnosis present

## 2016-05-17 DIAGNOSIS — F3189 Other bipolar disorder: Secondary | ICD-10-CM | POA: Diagnosis present

## 2016-05-17 DIAGNOSIS — F1420 Cocaine dependence, uncomplicated: Secondary | ICD-10-CM | POA: Diagnosis present

## 2016-05-17 LAB — RAPID URINE DRUG SCREEN, HOSP PERFORMED
Amphetamines: NOT DETECTED
BARBITURATES: NOT DETECTED
Benzodiazepines: NOT DETECTED
Cocaine: POSITIVE — AB
Opiates: NOT DETECTED
Tetrahydrocannabinol: POSITIVE — AB

## 2016-05-17 MED ORDER — NICOTINE POLACRILEX 2 MG MT GUM: 2.0000 mg | CHEWING_GUM | OROMUCOSAL | Status: DC | PRN

## 2016-05-17 MED ORDER — TRAZODONE HCL 50 MG PO TABS
50.0000 mg | ORAL_TABLET | Freq: Every evening | ORAL | Status: DC | PRN
  Administered 2016-05-17 – 2016-05-18 (×2): 50 mg via ORAL
  Filled 2016-05-17 (×13): qty 1

## 2016-05-17 MED ORDER — ACETAMINOPHEN 325 MG PO TABS
650.0000 mg | ORAL_TABLET | Freq: Four times a day (QID) | ORAL | Status: DC | PRN
  Administered 2016-05-17 (×2): 650 mg via ORAL
  Filled 2016-05-17 (×2): qty 2

## 2016-05-17 MED ORDER — ALUM & MAG HYDROXIDE-SIMETH 200-200-20 MG/5ML PO SUSP: 30.0000 mL | ORAL | Status: DC | PRN

## 2016-05-17 MED ORDER — ONDANSETRON HCL 4 MG PO TABS
4.0000 mg | ORAL_TABLET | Freq: Three times a day (TID) | ORAL | Status: DC | PRN
Start: 2016-05-17 — End: 2016-05-17

## 2016-05-17 MED ORDER — QUETIAPINE FUMARATE 300 MG PO TABS: 400.0000 mg | ORAL_TABLET | Freq: Every day | ORAL | Status: DC

## 2016-05-17 MED ORDER — ADULT MULTIVITAMIN W/MINERALS CH
1.0000 | ORAL_TABLET | Freq: Every day | ORAL | Status: DC
  Administered 2016-05-17: 1 via ORAL
  Filled 2016-05-17: qty 1

## 2016-05-17 MED ORDER — QUETIAPINE FUMARATE 50 MG PO TABS
50.0000 mg | ORAL_TABLET | Freq: Every day | ORAL | Status: DC
Start: 2016-05-17 — End: 2016-05-17
  Administered 2016-05-17: 50 mg via ORAL
  Filled 2016-05-17: qty 1

## 2016-05-17 MED ORDER — ACETAMINOPHEN 325 MG PO TABS
650.0000 mg | ORAL_TABLET | Freq: Four times a day (QID) | ORAL | Status: DC | PRN
  Administered 2016-05-20: 650 mg via ORAL
  Filled 2016-05-17: qty 2

## 2016-05-17 MED ORDER — QUETIAPINE FUMARATE 200 MG PO TABS
200.0000 mg | ORAL_TABLET | Freq: Every day | ORAL | Status: DC
  Administered 2016-05-17: 200 mg via ORAL
  Filled 2016-05-17 (×3): qty 1

## 2016-05-17 MED ORDER — QUETIAPINE FUMARATE ER 200 MG PO TB24
200.0000 mg | ORAL_TABLET | Freq: Every day | ORAL | Status: DC
  Administered 2016-05-18: 200 mg via ORAL
  Filled 2016-05-17 (×3): qty 1

## 2016-05-17 MED ORDER — LORAZEPAM 1 MG PO TABS
1.0000 mg | ORAL_TABLET | Freq: Three times a day (TID) | ORAL | Status: DC | PRN
  Administered 2016-05-17: 1 mg via ORAL
  Filled 2016-05-17: qty 1

## 2016-05-17 MED ORDER — LAMOTRIGINE 25 MG PO TABS
50.0000 mg | ORAL_TABLET | Freq: Every evening | ORAL | Status: DC
  Administered 2016-05-17 – 2016-05-20 (×4): 50 mg via ORAL
  Filled 2016-05-17 (×7): qty 2

## 2016-05-17 MED ORDER — MAGNESIUM HYDROXIDE 400 MG/5ML PO SUSP: 30.0000 mL | Freq: Every day | ORAL | Status: DC | PRN

## 2016-05-17 NOTE — BH Assessment (Addendum)
Tele Assessment Note  Patient is a 55 year old female that reports SI with a plan to overdose on her psychiatric medication or on drugs.  Patient reports command voices telling her to harm herself.   Patient reports that she has not been on her medication in four days.  Patient reports that she relapsed four days ago and she has been abusing cocaine every day since she relapsed.  Patient reports that her last use was yesterday when she used one gram of cocaine.  Patient denies withdrawal symptoms.  Patient denies a history of seizures.  Patient denies inpatient substance abuse treatment.   Patient reports a history of inpatient psychiatric hospitalizations.  Patient reports that she was diagnosed with Schizophrenia and she receives services from [facility].    Patient reports increased depression and anxiety associated with an upcoming court date for shoplifting on May 25, 2016.  Patient reports that she lives alone and she is not able to contract for safety.  Patient denies HI.  Patient denies physical, sexual or emotional abuse.     Diagnosis: Schizophrenia and Cocaine Abuse   Past Medical History:  Past Medical History  Diagnosis Date  . Depression   . Bipolar 1 disorder (HCC)   . Drug abuse   . Schizophrenia [facility](HCC)     Past Surgical History  Procedure Laterality Date  . No past surgeries      Family History:  Family History  Problem Relation Age of Onset  . Anesthesia problems Neg Hx   . Hypotension Neg Hx   . Malignant hyperthermia Neg Hx   . Pseudochol deficiency Neg Hx   . Mental illness Maternal Aunt   . Cancer Maternal Aunt     breast     Social History:  reports that she has been smoking Cigarettes.  She has a 2.5 pack-year smoking history. She has never used smokeless tobacco. She reports that she drinks about 1.8 oz of alcohol per week. She reports that she uses illicit drugs (Marijuana and Cocaine).  Additional Social History:  Alcohol / Drug Use History of alcohol /  drug use?: Yes Longest period of sobriety (when/how long): 3 years Negative Consequences of Use: Financial, Personal relationships, Work / School Substance #1 Name of Substance 1: Cocaine  1 - Age of First Use: 55 yo  1 - Amount (size/oz): 1 gram 1 - Frequency: Daily 1 - Duration: 1 year 1 - Last Use / Amount: Today used 1 gram   CIWA: CIWA-Ar BP: 109/64 mmHg Pulse Rate: 88 COWS:    PATIENT STRENGTHS: (choose at least two) Average or above average intelligence Capable of independent living Communication skills Physical Health  Allergies: No Known Allergies  Home Medications:  (Not in a hospital admission)  OB/GYN Status:  No LMP recorded. Patient is not currently having periods (Reason: Perimenopausal).  General Assessment Data [facility]: [facility] TTS Assessment: In system Is this a Tele or Face-to-Face Assessment?: Tele Assessment Is this an Initial Assessment or a Re-assessment for this encounter?: Initial Assessment Marital status: Single Maiden name: NA Is patient pregnant?: No Pregnancy Status: No Living Arrangements: Alone Can pt return to current living arrangement?: Yes Admission Status: Voluntary Is patient capable of signing voluntary admission?: Yes Referral Source: Self/Family/Friend [facility]: [facility]     Crisis Care Plan Living Arrangements: Alone Legal Guardian:  (NA) Name of Psychiatrist: [facility]  Name of Therapist: [facility]  Education Status Is patient currently in school?: No Current Grade: NA Highest grade of school patient  has completed: NA Name of school: NA Contact person: NA  Risk to self with the past 6 months Suicidal Ideation: Yes-Currently Present Has patient been a risk to self within the past 6 months prior to admission? : Yes Suicidal Intent: Yes-Currently Present Has patient had any suicidal intent within the past 6 months prior to admission? : Yes Is patient at risk for suicide?: Yes Suicidal Plan?:  Yes-Currently Present Has patient had any suicidal plan within the past 6 months prior to admission? : Yes Specify Current Suicidal Plan: SI with a plan to overdose Access to Means: Yes Specify Access to Suicidal Means: Overdose on psych meds or drugs What has been your use of drugs/alcohol within the last 12 months?: Cocaine Previous Attempts/Gestures: Yes How many times?: 3 Other Self Harm Risks: None Reported Triggers for Past Attempts: None known Intentional Self Injurious Behavior: None Family Suicide History: Yes Recent stressful life event(s): Financial Problems, Legal Issues Persecutory voices/beliefs?: Yes Depression: Yes Depression Symptoms: Despondent, Insomnia, Tearfulness, Isolating, Fatigue, Guilt, Loss of interest in usual pleasures, Feeling worthless/self pity Substance abuse history and/or treatment for substance abuse?: Yes Suicide prevention information given to non-admitted patients: Yes  Risk to Others within the past 6 months Homicidal Ideation: No Does patient have any lifetime risk of violence toward others beyond the six months prior to admission? : No Thoughts of Harm to Others: No Current Homicidal Intent: No Current Homicidal Plan: No Access to Homicidal Means: No Identified Victim: None Reported History of harm to others?: No Assessment of Violence: None Noted Violent Behavior Description: Calm Does patient have access to weapons?: No Criminal Charges Pending?: Yes Describe Pending Criminal Charges: Shoplifting Does patient have a court date: Yes Court Date: 05/25/16 Is patient on probation?: No  Psychosis Hallucinations: Auditory Delusions: None noted  Mental Status Report Appearance/Hygiene: In scrubs Eye Contact: Fair Motor Activity: Freedom of movement Speech: Logical/coherent Level of Consciousness: Alert Mood: Depressed, Anxious, Suspicious Affect: Depressed, Sad Anxiety Level: Minimal Thought Processes: Coherent,  Relevant Judgement: Impaired Orientation: Person, Place, Time, Situation Obsessive Compulsive Thoughts/Behaviors: None  Cognitive Functioning Concentration: Decreased Memory: Recent Intact, Remote Intact IQ: Average Insight: Fair Impulse Control: Poor Appetite: Poor Weight Loss: 5 Weight Gain: 0 Sleep: Decreased Total Hours of Sleep: 3 Vegetative Symptoms: Decreased grooming  ADLScreening [facility] Assessment Services) Patient's cognitive ability adequate to safely complete daily activities?: Yes Patient able to express need for assistance with ADLs?: Yes Independently performs ADLs?: Yes (appropriate for developmental age)  Prior Inpatient Therapy Prior Inpatient Therapy: Yes Prior Therapy Dates: Patient reports several Prior Therapy Facilty/Provider(s): [facility] Reason for Treatment: SI and SA  Prior Outpatient Therapy Prior Outpatient Therapy: Yes Prior Therapy Dates: [place]  Prior Therapy Facilty/Provider(s): [facility] Reason for Treatment: Medication Management Does patient have an ACCT team?: No Does patient have Intensive In-House Services?  : No Does patient have [facility] services? : Yes Does patient have P4CC services?: No  ADL Screening (condition at time of admission) Patient's cognitive ability adequate to safely complete daily activities?: Yes Is the patient deaf or have difficulty hearing?: No Does the patient have difficulty seeing, even when wearing glasses/contacts?: No Does the patient have difficulty concentrating, remembering, or making decisions?: No Patient able to express need for assistance with ADLs?: Yes Does the patient have difficulty dressing or bathing?: No Independently performs ADLs?: Yes (appropriate for developmental age) Does the patient have difficulty walking or climbing stairs?: No Weakness of Legs: None Weakness of Arms/Hands: None  Home Assistive Devices/Equipment Home Assistive Devices/Equipment:  None    Abuse/Neglect Assessment  (Assessment to be complete while patient is alone) Physical Abuse: Denies Verbal Abuse: Denies Sexual Abuse: Denies Exploitation of patient/patient's resources: Denies Self-Neglect: Denies Values / Beliefs Cultural Requests During Hospitalization: None Spiritual Requests During Hospitalization: None Consults Spiritual Care Consult Needed: No Social Work Consult Needed: No Merchant navy officer (For Healthcare) Does patient have an advance directive?: No Would patient like information on creating an advanced directive?: No - patient declined information    Additional Information 1:1 In Past 12 Months?: No CIRT Risk: No Elopement Risk: No Does patient have medical clearance?: Yes     Disposition: Per Donell Sievert, NP - patient meets criteria for inpatient hospital.  Writer informed the [facility].   Disposition Initial Assessment Completed for this Encounter: Yes  Phillip Heal LaVerne 05/17/2016 1:21 AM

## 2016-05-17 NOTE — ED Notes (Signed)
[facility]. Pt signed for personal belongings and personal belongings given to [facility] for transfer. Pt signed e-signature. Pt denies SI/HI. Pt ambulatory off unit with [facility].

## 2016-05-17 NOTE — Progress Notes (Signed)
Adult Psychoeducational Group Note  Date:  05/17/2016 Time:  8:10 PM  Group Topic/Focus:  Wrap-Up Group:   The focus of this group is to help patients review their daily goal of treatment and discuss progress on daily workbooks.  Participation Level:  Did Not Attend  Pt did not attend wrap-up group. Pt was in bed during wrap-up group.    Cleotilde NeerJasmine S Jasaun Carn 05/17/2016, 8:19 PM

## 2016-05-17 NOTE — Plan of Care (Signed)
Problem: Education: Goal: Knowledge of [facility] Education information/materials will improve Outcome: Progressing Education and initial orientation to unit including rules completed.
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
## 2016-05-17 NOTE — ED Notes (Signed)
Report given to RN in SAPPU. Pt to go to bed 39
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
## 2016-05-17 NOTE — BH Assessment (Signed)
BHH Assessment Progress Note  Per Carolanne GrumblingGerald Taylor, MD, this pt requires psychiatric hospitalization at this time.  Lillia AbedLindsay, RN, [facility].  Pt has signed Voluntary Admission and Consent for Treatment.  She reports that she receives outpatient [facility], but refuses to sign Consent to Release Information for them.  Signed forms have been fax[facility].  Pt's nurse, Morrie Sheldonshley, has been notified, and agrees to send original paperwork along with pt via [facility], and to call report to [phone].  Doylene Canninghomas Joniqua Sidle, MA Triage Specialist [phone]

## 2016-05-17 NOTE — ED Notes (Signed)
Pt behavior cooperative this shift. Denies SI/HI. Endorsing AH. Pt compliant with medication regimen. Reports pending legal charges d/t shoplifting with upcoming court date. Special checks q 15 mins in place for safety. Video monitoring in place. Will continue to monitor.

## 2016-05-17 NOTE — BH Assessment (Signed)
Per [name], PA - patient meets criteria for inpatient hospitalization.   Writer informed the AC (Tori).  

## 2016-05-17 NOTE — Tx Team (Signed)
Initial Interdisciplinary Treatment Plan   PATIENT STRESSORS: Financial difficulties Legal issue Substance abuse   PATIENT STRENGTHS: Active sense of humor Average or above average intelligence   PROBLEM LIST: Problem List/Patient Goals Date to be addressed Date deferred Reason deferred Estimated date of resolution  Drug abuse 05/17/2016      Auditory Hallucinations 05/17/2016     Suicidal ideation 05/17/2016     "Work on my court case" 05/17/2016     "Talking to my lawyer" 05/17/2016                              DISCHARGE CRITERIA:  Ability to meet basic life and health needs Adequate post-discharge living arrangements Improved stabilization in mood, thinking, and/or behavior Medical problems require only outpatient monitoring Motivation to continue treatment in a less acute level of care Need for constant or close observation no longer present Reduction of life-threatening or endangering symptoms to within safe limits Safe-care adequate arrangements made Verbal commitment to aftercare and medication compliance  PRELIMINARY DISCHARGE PLAN: Outpatient therapy  PATIENT/FAMIILY INVOLVEMENT: This treatment plan has been presented to and reviewed with the patient, Hollice GongKimberly Spira.  The patient and family have been given the opportunity to ask questions and make suggestions.  Lindajo RoyalDaniel P Karder Goodin 05/17/2016, 4:01 PM

## 2016-05-17 NOTE — ED Notes (Signed)
Patient denies SI,HI and VH at this time. [facility] states "I hear whispers". Plan of care discussed and patient voices no complaints or concerns at this time. Patient given Malaw[place]key sandwich and soda. Encouragement and support provided and safety maintain.  Q 15 min safety checks in place.

## 2016-05-17 NOTE — Progress Notes (Signed)
DAR: Pt presents with anxious mood and blunt affect on arrival to unit. Meal tray given and tolerated well. Pt slept. OOB for dinner, went to the cafeteria accompanied by unit staff; returned without issues. Pt asleep at present without issues. Q 15 minutes checks maintained for safety.

## 2016-05-17 NOTE — Consult Note (Signed)
[facility] Consult   Reason for Consult:  Auditory hallucination, Anxiety, Suicide idaetion Referring Physician:  EDP Patient Identification: [name] MRN:  [id number] Principal Diagnosis: Affective psychosis, bipolar ([place]) Diagnosis:   Patient Active Problem List   Diagnosis Date Noted  . Affective psychosis, bipolar ([place]) [F31.9]     Priority: High  . Tobacco dependence [F17.200] 02/15/2016  . Annual physical exam [Z00.00] 02/15/2016  . Encounter for routine gynecological examination [Z01.419] 02/15/2016  . Breast cancer screening [Z12.39] 02/15/2016  . Colon cancer screening [Z12.11] 02/15/2016  . Chlamydia infection [A74.9] 09/24/2015  . Cannabis use disorder, moderate, dependence ([place]) [F12.20] 09/22/2015  . Cocaine use disorder, moderate, dependence ([place]) [F14.20] 09/22/2015  . Bipolar disorder, current episode manic without psychotic features, severe ([employer]) [F31.13] 09/22/2015  . Suicidal ideation [R45.851]   . Homicidal ideation [R45.850]   . Hyperglycemia [R73.9] 09/04/2012  . UTI (lower urinary tract infection) [N39.0] 09/04/2012  . Hallucination [R44.3] 08/31/2012  . Psychosis [F29] 11/16/2011    Class: Acute  . Polysubstance abuse [F19.10] 11/14/2011    Total Time spent with patient: 45 minutes  Subjective:   [name] is a 55 y.o. female patient admitted with Auditory hallucination, Anxiety  HPI:   AA female, 55 years old was evaluated for auditory hallucination with the voices telling her to kill herself.  Patient is endorsing suicide with plans to use Cocaine and wine.  She also states she want her Seroquel renewed because she has not been taking her Seroquel.   Patient is worried about an upcoming court date for shop lifting.  Patient admits to numerous  Shop lifting and stealing.   She was last hospitalized at [facility] last September for treatment of Bipolar disorder  Patient reports poor sleep and appetite.   She has been accepted for  admission and we will be seeking placement at any facility with available bed.  Past Psychiatric History:  Bipolar disorder, Affective Psychosis, Cocaine use disorder, severe    Risk to Self: Suicidal Ideation: Yes-Currently Present Suicidal Intent: Yes-Currently Present Is patient at risk for suicide?: Yes Suicidal Plan?: Yes-Currently Present Specify Current Suicidal Plan: SI with a plan to overdose Access to Means: Yes Specify Access to Suicidal Means: Overdose on psych meds or drugs What has been your use of drugs/alcohol within the last 12 months?: Cocaine How many times?: 3 Other Self Harm Risks: None Reported Triggers for Past Attempts: None known Intentional Self Injurious Behavior: None Risk to Others: Homicidal Ideation: No Thoughts of Harm to Others: No Current Homicidal Intent: No Current Homicidal Plan: No Access to Homicidal Means: No Identified Victim: None Reported History of harm to others?: No Assessment of Violence: None Noted Violent Behavior Description: Calm Does patient have access to weapons?: No Criminal Charges Pending?: Yes Describe Pending Criminal Charges: Shoplifting Does patient have a court date: Yes Court Date: 05/25/16 Prior Inpatient Therapy: Prior Inpatient Therapy: Yes Prior Therapy Dates: Patient reports several Prior Therapy Facilty/Provider(s): [facility] Reason for Treatment: SI and SA Prior Outpatient Therapy: Prior Outpatient Therapy: Yes Prior Therapy Dates: [place]  Prior Therapy Facilty/Provider(s): [facility] Reason for Treatment: Medication Management Does patient have an ACCT team?: No Does patient have Intensive In-House Services?  : No Does patient have [facility] services? : Yes Does patient have P4[facility] services?: No  Past Medical History:  Past Medical History  Diagnosis Date  . Depression   . Bipolar 1 disorder ([place])   . Drug abuse   . Schizophrenia ([place])     Past  Surgical History  Procedure Laterality Date  . No past  surgeries     Family History:  Family History  Problem Relation Age of Onset  . Anesthesia problems Neg Hx   . Hypotension Neg Hx   . Malignant hyperthermia Neg Hx   . Pseudochol deficiency Neg Hx   . Mental illness Maternal Aunt   . Cancer Maternal Aunt     breast    Family Psychiatric  History:  Denies  Social History:  History  Alcohol Use  . 1.8 oz/week  . 3 Cans of beer per week    Comment: once a week a 40 oz.      History  Drug Use  . Yes  . Special: Marijuana, Cocaine    Comment: occasionally    Social History   Social History  . Marital Status: Single    Spouse Name: N/A  . Number of Children: N/A  . Years of Education: N/A   Social History Main Topics  . Smoking status: Current Every Day Smoker -- 0.25 packs/day for 10 years    Types: Cigarettes  . Smokeless tobacco: Never Used  . Alcohol Use: 1.8 oz/week    3 Cans of beer per week     Comment: once a week a 40 oz.   . Drug Use: Yes    Special: Marijuana, Cocaine     Comment: occasionally  . Sexual Activity: Yes    Birth Control/ Protection: Condom   Other Topics Concern  . None   Social History Narrative   Additional Social History:    Allergies:  No Known Allergies  Labs:  Results for orders placed or performed during the hospital encounter of 05/16/16 (from the past 48 hour(s))  Comprehensive metabolic panel     Status: Abnormal   Collection Time: 05/16/16  9:34 PM  Result Value Ref Range   Sodium 140 135 - 145 mmol/L   Potassium 4.3 3.5 - 5.1 mmol/L   Chloride 107 101 - 111 mmol/L   CO2 28 22 - 32 mmol/L   Glucose, Bld 103 (H) 65 - 99 mg/dL   BUN 15 6 - 20 mg/dL   Creatinine, Ser 1.03 (H) 0.44 - 1.00 mg/dL   Calcium 9.8 8.9 - 10.3 mg/dL   Total Protein 7.3 6.5 - 8.1 g/dL   Albumin 4.3 3.5 - 5.0 g/dL   AST 21 15 - 41 U/L   ALT 16 14 - 54 U/L   Alkaline Phosphatase 60 38 - 126 U/L   Total Bilirubin 0.6 0.3 - 1.2 mg/dL   GFR calc non Af Amer >60 >60 mL/min   GFR calc Af Amer >60  >60 mL/min    Comment: (NOTE) The eGFR has been calculated using the CKD EPI equation. This calculation has not been validated in all clinical situations. eGFR's persistently <60 mL/min signify possible Chronic Kidney Disease.    Anion gap 5 5 - 15  Ethanol     Status: None   Collection Time: 05/16/16  9:34 PM  Result Value Ref Range   Alcohol, Ethyl (B) <5 <5 mg/dL    Comment:        LOWEST DETECTABLE LIMIT FOR SERUM ALCOHOL IS 5 mg/dL FOR MEDICAL PURPOSES ONLY   Salicylate level     Status: None   Collection Time: 05/16/16  9:34 PM  Result Value Ref Range   Salicylate Lvl <0.2 2.8 - 30.0 mg/dL  Acetaminophen level     Status: Abnormal   Collection Time:  05/16/16  9:34 PM  Result Value Ref Range   Acetaminophen (Tylenol), Serum <10 (L) 10 - 30 ug/mL    Comment:        THERAPEUTIC CONCENTRATIONS VARY SIGNIFICANTLY. A RANGE OF 10-30 ug/mL MAY BE AN EFFECTIVE CONCENTRATION FOR MANY PATIENTS. HOWEVER, SOME ARE BEST TREATED AT CONCENTRATIONS OUTSIDE THIS RANGE. ACETAMINOPHEN CONCENTRATIONS >150 ug/mL AT 4 HOURS AFTER INGESTION AND >50 ug/mL AT 12 HOURS AFTER INGESTION ARE OFTEN ASSOCIATED WITH TOXIC REACTIONS.   cbc     Status: Abnormal   Collection Time: 05/16/16  9:34 PM  Result Value Ref Range   WBC 5.5 4.0 - 10.5 K/uL   RBC 5.08 3.87 - 5.11 MIL/uL   Hemoglobin 15.2 (H) 12.0 - 15.0 g/dL   HCT 44.4 36.0 - 46.0 %   MCV 87.4 78.0 - 100.0 fL   MCH 29.9 26.0 - 34.0 pg   MCHC 34.2 30.0 - 36.0 g/dL   RDW 14.6 11.5 - 15.5 %   Platelets 252 150 - 400 K/uL  Rapid urine drug screen (hospital performed)     Status: Abnormal   Collection Time: 05/17/16  9:10 AM  Result Value Ref Range   Opiates NONE DETECTED NONE DETECTED   Cocaine POSITIVE (A) NONE DETECTED   Benzodiazepines NONE DETECTED NONE DETECTED   Amphetamines NONE DETECTED NONE DETECTED   Tetrahydrocannabinol POSITIVE (A) NONE DETECTED   Barbiturates NONE DETECTED NONE DETECTED    Comment:        DRUG SCREEN  FOR MEDICAL PURPOSES ONLY.  IF CONFIRMATION IS NEEDED FOR ANY PURPOSE, NOTIFY LAB WITHIN 5 DAYS.        LOWEST DETECTABLE LIMITS FOR URINE DRUG SCREEN Drug Class       Cutoff (ng/mL) Amphetamine      1000 Barbiturate      200 Benzodiazepine   715 Tricyclics       953 Opiates          300 Cocaine          300 THC              50     Current Facility-Administered Medications  Medication Dose Route Frequency Provider Last Rate Last Dose  . acetaminophen (TYLENOL) tablet 650 mg  650 mg Oral Q6H PRN Nona Dell, PA-C   650 mg at 05/17/16 9672  . LORazepam (ATIVAN) tablet 1 mg  1 mg Oral Q8H PRN Nona Dell, PA-C   1 mg at 05/17/16 8979  . ondansetron (ZOFRAN) tablet 4 mg  4 mg Oral Q8H PRN Nona Dell, [place]       Current Outpatient Prescriptions  Medication Sig Dispense Refill  . acetaminophen (TYLENOL) 500 MG tablet Take 1,000 mg by mouth every 6 (six) hours as needed for moderate pain.    Marland Kitchen QUEtiapine (SEROQUEL) 400 MG tablet Take 1 tablet (400 mg total) by mouth at bedtime. 30 tablet 0  . QUEtiapine (SEROQUEL) 50 MG tablet Take 1 tablet (50 mg total) by mouth daily. 30 tablet 0  . Multiple Vitamin (MULTIVITAMIN WITH MINERALS) TABS tablet Take 1 tablet by mouth daily. (Patient not taking: Reported on 05/16/2016) 30 tablet 0  . QUEtiapine (SEROQUEL) 300 MG tablet Take 1 tablet (300 mg total) by mouth at bedtime. (Patient not taking: Reported on 05/16/2016) 30 tablet 0    Musculoskeletal: Strength & Muscle Tone: within normal limits Gait & Station: normal Patient leans: N/A  Psychiatric Specialty Exam: Physical Exam  Review of Systems  Constitutional:  Negative.   HENT: Negative.   Eyes: Negative.   Respiratory: Negative.   Cardiovascular: Negative.   Gastrointestinal: Negative.   Genitourinary: Negative.   Musculoskeletal: Negative.   Skin: Negative.   Neurological: Negative.   Endo/Heme/Allergies: Negative.     Blood pressure 104/63,  pulse 63, temperature 98.1 F (36.7 C), temperature source Oral, resp. rate 17, height 5' (1.524 m), weight 54.432 kg (120 lb), SpO2 99 %.Body mass index is 23.44 kg/(m^2).  General Appearance: Casual  Eye Contact:  Good  Speech:  Clear and Coherent and Normal Rate  Volume:  Normal  Mood:  Anxious and Depressed  Affect:  Congruent and Depressed  Thought Process:  Coherent and Goal Directed  Orientation:  Full (Time, Place, and Person)  Thought Content:  Hallucinations: Auditory Command:  killself  Suicidal Thoughts:  No  Homicidal Thoughts:  No  Memory:  Immediate;   Good Recent;   Good Remote;   Good  Judgement:  Poor  Insight:  Shallow  Psychomotor Activity:  Normal  Concentration:  Concentration: Good  Recall:  [place] of Knowledge:  Fair  Language:  Good  Akathisia:  No  Handed:  Right  AIMS (if indicated):     Assets:  Desire for Improvement Housing  ADL's:  Intact  Cognition:  WNL  Sleep:        Treatment Plan Summary: Daily contact with patient to assess and evaluate symptoms and progress in treatment and Medication management  Disposition:  Accepted for admission and we will be seeking placement at any facility with available bed.  We have resumed her home medications.   Delfin Gant, NP   PMHNP-BC 05/17/2016 12:04 PM

## 2016-05-17 NOTE — ED Notes (Signed)
Patient reports increase anxiety at this time. Respirations equal and unlabored, skin warm and dry. NAD noted. Will medicated patient per MAR.

## 2016-05-18 ENCOUNTER — Encounter (HOSPITAL_COMMUNITY): Payer: Self-pay | Admitting: Psychiatry

## 2016-05-18 DIAGNOSIS — F1220 Cannabis dependence, uncomplicated: Secondary | ICD-10-CM

## 2016-05-18 DIAGNOSIS — F250 Schizoaffective disorder, bipolar type: Principal | ICD-10-CM | POA: Diagnosis present

## 2016-05-18 DIAGNOSIS — F1420 Cocaine dependence, uncomplicated: Secondary | ICD-10-CM

## 2016-05-18 DIAGNOSIS — R45851 Suicidal ideations: Secondary | ICD-10-CM

## 2016-05-18 LAB — RAPID HIV SCREEN (HIV 1/2 AB+AG)
HIV 1/2 Antibodies: NONREACTIVE
HIV-1 P24 ANTIGEN - HIV24: NONREACTIVE

## 2016-05-18 LAB — PREGNANCY, URINE: PREG TEST UR: NEGATIVE

## 2016-05-18 MED ORDER — OLANZAPINE 10 MG IM SOLR: 10.0000 mg | Freq: Three times a day (TID) | INTRAMUSCULAR | Status: DC | PRN

## 2016-05-18 MED ORDER — OLANZAPINE 10 MG PO TBDP: 10.0000 mg | ORAL_TABLET | Freq: Three times a day (TID) | ORAL | Status: DC | PRN

## 2016-05-18 MED ORDER — QUETIAPINE FUMARATE 200 MG PO TABS
200.0000 mg | ORAL_TABLET | ORAL | Status: DC
  Filled 2016-05-18 (×2): qty 1

## 2016-05-18 MED ORDER — QUETIAPINE FUMARATE 50 MG PO TABS
50.0000 mg | ORAL_TABLET | Freq: Every day | ORAL | Status: DC
  Administered 2016-05-19 – 2016-05-21 (×3): 50 mg via ORAL
  Filled 2016-05-18 (×6): qty 1

## 2016-05-18 MED ORDER — QUETIAPINE FUMARATE 400 MG PO TABS
400.0000 mg | ORAL_TABLET | Freq: Every day | ORAL | Status: DC
  Administered 2016-05-18 – 2016-05-20 (×3): 400 mg via ORAL
  Filled 2016-05-18 (×5): qty 1

## 2016-05-18 NOTE — Progress Notes (Signed)
Data Patient spent most of the day in bed asleep.  Complains of depression, denies other complaints. Affect blunted, appropriate mood "depressed."  Denies HI, discloses passive SI.  Reports auditory hallucinations have decreased since admission. Says she would like to get back on her multivitamin "I feel so much better on it at home."  Patient initially refused Lamictal at 1800.  Action Education given on Lamictal- nurse explained it was a mood stabilizer that would help long term with mood swings, and to report rash if there is one.  Recommended patient follow up with MD tomorrow regarding multivitamin.  Remained on 15 minute checks.  Response Remains safe on unit.  Patient initially refused Lamictal but then agreed to take it after educated- said "Oh I need that." Agrees to mention multivitamin to MD tomorrow.

## 2016-05-18 NOTE — Tx Team (Signed)
Interdisciplinary Treatment Plan Update (Adult)  Date:  05/18/2016   Time Reviewed:  8:27 AM   Progress in Treatment: Attending groups:No Participating in groups:  No Taking medication as prescribed:  Yes. Tolerating medication:  Yes. Family/Significant other contact made:  No Patient understands diagnosis:  Yes  As evidenced by seeking help with SI, AH Discussing patient identified problems/goals with staff:  Yes, see initial care plan. Medical problems stabilized or resolved:  Yes. Denies suicidal/homicidal ideation: Yes. Issues/concerns per patient self-inventory:  No. Other:  New problem(s) identified:  Discharge Plan or Barriers: see below  Reason for Continuation of Hospitalization: Depression Hallucinations Medication stabilization  Comments:  Pt from home via a friend with complaints of SI and auditory hallucinations of whispers. Pt denies any HI. Pt states that she wants help detoxing from cocaine. Pt states she has been off her seroquel x 4 days because she ran out and is now having passive thoughts of hurting her self. Pt states she had a plan to drink etoh and consume cocaine until she died. Pt is laughing while stating her plan.  Will start Seroquel 50 mg po daily and 400 mg po qhs for psychosis/mood sx. Will continue Lamictal 50 mg po daily for mood sx. Will make available PRN medications as per agitation protocol.  Estimated length of stay: 4-5 days  New goal(s):  Review of initial/current patient goals per problem list:   Review of initial/current patient goals per problem list:  1. Goal(s): Patient will participate in aftercare plan   Met: Yes   Target date: 3-5 days post admission date   As evidenced by: Patient will participate within aftercare plan AEB aftercare provider and housing plan at discharge being identified. 05/18/16:  Return home, follow up outpt at [facility]   2. Goal (s): Patient will exhibit decreased depressive symptoms and suicidal  ideations.   Met: No   Target date: 3-5 days post admission date   As evidenced by: Patient will utilize self rating of depression at 3 or below and demonstrate decreased signs of depression or be deemed stable for discharge by MD. 05/18/16:  Rates depression a 10 today    4. Goal(s): Patient will demonstrate decreased signs of withdrawal due to substance abuse   Met: No   Target date: 3-5 days post admission date   As evidenced by: Patient will produce a CIWA/COWS score of 0, have stable vitals signs, and no symptoms of withdrawal 05/18/16:  Pt in bed, isolating in a post cocaine funk     5. Goal(s): Patient will demonstrate decreased signs of psychosis  * Met: No  * Target date: 3-5 days post admission date  * As evidenced by: Patient will demonstrate decreased frequency of AVH or return to baseline function 05/18/16:  Pt c/o AH          Attendees: Patient:  05/18/2016 8:27 AM   Family:   05/18/2016 8:27 AM   Physician:  Ursula Alert, MD 05/18/2016 8:27 AM   Nursing:   Edd Arbour, RN 05/18/2016 8:27 AM   CSW:    Roque Lias, LCSW   05/18/2016 8:27 AM   Other:  05/18/2016 8:27 AM   Other:   05/18/2016 8:27 AM   Other:  Lars Pinks, Nurse CM 05/18/2016 8:27 AM   Other:   05/18/2016 8:27 AM   Other:  Norberto Sorenson, [place]  05/18/2016 8:27 AM   Other:  05/18/2016 8:27 AM   Other:  05/18/2016 8:27 AM   Other:  05/18/2016 8:27  AM   Other:  05/18/2016 8:27 AM   Other:  05/18/2016 8:27 AM   Other:   05/18/2016 8:27 AM    Scribe for Treatment Team:   Trish Mage, 05/18/2016 8:27 AM

## 2016-05-18 NOTE — [facility] Group Notes (Signed)
[facility] Group Notes:  (Counselor/Nursing/MHT/Case Management/Adjunct)  05/18/2016 1:15PM  Type of Therapy:  Group Therapy  Participation Level:  Active  Participation Quality:  Appropriate  Affect:  Flat  Cognitive:  Oriented  Insight:  Improving  Engagement in Group:  Limited  Engagement in Therapy:  Limited  Modes of Intervention:  Discussion, Exploration and Socialization  Summary of Progress/Problems: The topic for group was balance in life.  Pt participated in the discussion about when their life was in balance and out of balance and how this feels.  Pt discussed ways to get back in balance and short term goals they can work on to get where they want to be.  [name].  Chose to not attend.  Daryel Geraldorth, Darrien Laakso B 05/18/2016 2:19 PM
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
## 2016-05-18 NOTE — Progress Notes (Signed)
D- Patient has been in her room for the majority of the shift patient reported hearing voices, but states that they have decreased since she has been here.  Patient has not attended groups this shift. Patient denies SI, HI and VH. Patient did not engage Clinical research associatewriter in 1:1 staff talks this shift.   A- assess patient for safety, offer medications as prescribed, engage patient in 1:1 staff talks.   R-  Patient able to contract for safety.

## 2016-05-18 NOTE — [facility] Suicide Risk Assessment (Signed)
[facility]   Nursing information obtained from:    Demographic factors:    Current Mental Status:    Loss Factors:    Historical Factors:    Risk Reduction Factors:     Total Time spent with patient: 30 minutes Principal Problem: Schizoaffective disorder, bipolar type ([facility]) Diagnosis:   Patient Active Problem List   Diagnosis Date Noted  . Schizoaffective disorder, bipolar type ([facility]) [F25.0] 05/18/2016  . Annual physical exam [Z00.00] 02/15/2016  . Encounter for routine gynecological examination [Z01.419] 02/15/2016  . Breast cancer screening [Z12.39] 02/15/2016  . Affective psychosis, bipolar ([facility]) [F31.9]   . Cannabis use disorder, moderate, dependence ([facility]) [F12.20] 09/22/2015  . Cocaine use disorder, moderate, dependence ([facility]) [F14.20] 09/22/2015  . UTI (lower urinary tract infection) [N39.0] 09/04/2012   Subjective Data: Please see H&P.   Continued Clinical Symptoms:  Alcohol Use Disorder Identification Test Final Score (AUDIT): 10 The "Alcohol Use Disorders Identification Test", Guidelines for Use in Primary Care, Second Edition.  World [employer]Health [employer] [facility](WHO). Score between 0-7:  no or low risk or alcohol related problems. Score between 8-15:  moderate risk of alcohol related problems. Score between 16-19:  high risk of alcohol related problems. Score 20 or above:  warrants further diagnostic evaluation for alcohol dependence and treatment.   CLINICAL FACTORS:   Alcohol/Substance Abuse/Dependencies Unstable or Poor Therapeutic Relationship Previous Psychiatric Diagnoses and Treatments   Musculoskeletal: Strength & Muscle Tone: within normal limits Gait & Station: normal Patient leans: N/A  Psychiatric Specialty Exam: Physical Exam  Review of Systems  Psychiatric/Behavioral: Positive for depression, suicidal ideas and substance abuse. The patient is nervous/anxious and has insomnia.   All other systems reviewed and are negative.    Blood pressure 122/65, pulse 74, temperature 98.9 F (37.2 C), temperature source Oral, resp. rate 20, height 4' 11.25" (1.505 m), weight 51.823 kg (114 lb 4 oz).Body mass index is 22.88 kg/(m^2).                  Please see H&P.                                         COGNITIVE FEATURES THAT CONTRIBUTE TO RISK:  Closed-mindedness, Polarized thinking and Thought constriction (tunnel vision)    SUICIDE RISK:   Moderate:  Frequent suicidal ideation with limited intensity, and duration, some specificity in terms of plans, no associated intent, good self-control, limited dysphoria/symptomatology, some risk factors present, and identifiable protective factors, including available and accessible social support.  PLAN OF CARE: Please see H&P.   I certify that inpatient services furnished can reasonably be expected to improve the patient's condition.   Rue Tinnel, MD 05/18/2016, 10:47 AM

## 2016-05-18 NOTE — Progress Notes (Signed)
Pt has been in bed asleep most of the shift.  Initially, pt did not have evening meds to give, so writer was just going to let her sleep.  Meds were ordered for hs, so about 2230, writer went to pt's room and informed her that she had night meds.  Pt awoke, but mostly kept her eyes closed.  She did Archivistinform writer that she usually takes 400 mg of Seroquel at night and had not taken Lamictal at home.  Pt did take all of the medications offered to her and was encouraged to discuss her medication concerns with the MD in the morning.  She denied having SI/HI/AVH, but was still sleepy at the time of our conversation.  Support and encouragement offered.  Discharge plans are in process.  Safety maintained with q15 minute checks.

## 2016-05-18 NOTE — Progress Notes (Signed)
Pt did not attend karaoke group this evening.  

## 2016-05-19 LAB — GC/CHLAMYDIA PROBE AMP (~~LOC~~) NOT AT ARMC
CHLAMYDIA, DNA PROBE: NEGATIVE
NEISSERIA GONORRHEA: NEGATIVE

## 2016-05-19 LAB — HEPATITIS PANEL, ACUTE
HEP B S AG: NEGATIVE
Hep A IgM: NEGATIVE
Hep B C IgM: NEGATIVE

## 2016-05-19 LAB — RPR: RPR: NONREACTIVE

## 2016-05-19 MED ORDER — ENSURE ENLIVE PO LIQD
237.0000 mL | Freq: Two times a day (BID) | ORAL | Status: DC
  Administered 2016-05-19 – 2016-05-21 (×4): 237 mL via ORAL

## 2016-05-19 MED ORDER — ADULT MULTIVITAMIN W/MINERALS CH
1.0000 | ORAL_TABLET | Freq: Every day | ORAL | Status: DC
  Administered 2016-05-19 – 2016-05-20 (×2): 1 via ORAL
  Filled 2016-05-19 (×3): qty 1

## 2016-05-19 NOTE — [facility] Group Notes (Addendum)
[facility] LCSW Group Therapy 05/19/2016  1:15 pm  Type of Therapy: Group Therapy Participation Level: Minimal  Participation Quality: Limited  Affect: Lethargic, depressed, flat  Cognitive: Alert and Oriented  Insight: Developing/Improving and Engaged  Engagement in Therapy: Developing/Improving and Engaged  Modes of Intervention: Clarification, Confrontation, Discussion, Education, Exploration,  Limit-setting, Orientation, Problem-solving, Rapport Building, Dance movement psychotherapisteality Testing, Socialization and Support  Summary of Progress/Problems: Pt participated minimally in conversation related to hope despite CSW encouragement. Patient left group early.  Samuella BruinKristin Yuepheng Schaller, LCSW Clinical Social Worker [facility] [phone]

## 2016-05-19 NOTE — Progress Notes (Signed)
D-  Patient has been in her room for the majority of the shift.  Patient has not attended any groups, but was compliant with her medications.  Patient denies SI, HI and VH, but reports AH.  Patient said that the hallucinations have been decreasing and that she is feeling safer since she has been in the hospital.   A- Assess patient for safety, offer medications as prescribed, engage patient in 1:1 staff talks.   R-  Patient able to contract for safety, continue to monitor.

## 2016-05-19 NOTE — Tx Team (Signed)
Interdisciplinary Treatment Plan Update (Adult)  Date:  05/19/2016   Time Reviewed:  4:04 PM   Progress in Treatment: Attending groups:No Participating in groups:  No Taking medication as prescribed:  Yes. Tolerating medication:  Yes. Family/Significant other contact made:  Pt declined to consent to family contact. SPE completed with patient.  Patient understands diagnosis:  Yes  As evidenced by seeking help with SI, AH Discussing patient identified problems/goals with staff:  Yes, see initial care plan. Medical problems stabilized or resolved:  Yes. Denies suicidal/homicidal ideation: Yes. Issues/concerns per patient self-inventory:  No. Other:  Discharge Plan or Barriers: Return home to apt; follow-up at [facility]. Mental Health Association, AA/NA info provided to pt prior to discharge.   Reason for Continuation of Hospitalization: Medication management   Comments:  Pt from home via a friend with complaints of SI and auditory hallucinations of whispers. Pt denies any HI. Pt states that she wants help detoxing from cocaine. Pt states she has been off her seroquel x 4 days because she ran out and is now having passive thoughts of hurting her self. Pt states she had a plan to drink etoh and consume cocaine until she died. Pt is laughing while stating her plan.  Will start Seroquel 50 mg po daily and 400 mg po qhs for psychosis/mood sx. Will continue Lamictal 50 mg po daily for mood sx. Will make available PRN medications as per agitation protocol.  Estimated length of stay: per Dr. Shea Evans, Discharge scheduled for Sunday 05/21/16.   Review of initial/current patient goals per problem list:   Review of initial/current patient goals per problem list:  1. Goal(s): Patient will participate in aftercare plan   Met: Yes   Target date: 3-5 days post admission date   As evidenced by: Patient will participate within aftercare plan AEB aftercare provider and housing plan at discharge being  identified. 05/18/16:  Return home, [facility]   2. Goal (s): Patient will exhibit decreased depressive symptoms and suicidal ideations.   Met: Yes   Target date: 3-5 days post admission date   As evidenced by: Patient will utilize self rating of depression at 3 or below and demonstrate decreased signs of depression or be deemed stable for discharge by MD. 05/18/16:  Rates depression a 10 today 5/26: Rates depression as "improving" today. Pt appears to be nearing baseline. Per MD, pt is stable for discharge on Sunday.     3. Goal(s): Patient will demonstrate decreased signs of withdrawal due to substance abuse   Met: Yes   Target date: 3-5 days post admission date   As evidenced by: Patient will produce a CIWA/COWS score of 0, have stable vitals signs, and no symptoms of withdrawal 05/18/16:  Pt in bed, isolating in a post cocaine funk 5/26: Pt reports minimal withdrawals with stable vitals. Per MD, pt is medically stable for d/c.    4. Goal(s): Patient will demonstrate decreased signs of psychosis  * Met: Yes  * Target date: 3-5 days post admission date  * As evidenced by: Patient will demonstrate decreased frequency of AVH or return to baseline function 05/18/16:  Pt c/o AH 05/19/16:Patient denies SI, HI and VH, but reports AH. Patient said that the hallucinations have been decreasing and that she is feeling safer since she has been in the hospital. She appears to be nearing her baseline and per MD is stable for d/c on Sunday.   Attendees: Patient:  05/19/2016 4:04 PM   Family:  05/19/2016 4:04 PM   Physician:  Ursula Alert, MD 05/19/2016 4:04 PM   Nursing:   Danae Chen RN 05/19/2016 4:04 PM   CSW:    Rolette, LCSW  05/19/2016 4:04 PM   Other: Peri Maris, Barrie Lyme Drinkard LCSW 05/19/2016 4:04 PM   Other:   05/19/2016 4:04 PM   Other:  Lars Pinks, Nurse CM 05/19/2016 4:04 PM   Other:   05/19/2016 4:04 PM   Other:  Norberto Sorenson,  [place]  05/19/2016 4:04 PM   Other:  05/19/2016 4:04 PM   Other:  05/19/2016 4:04 PM   Other:  05/19/2016 4:04 PM   Other:  05/19/2016 4:04 PM   Other:  05/19/2016 4:04 PM   Other:   05/19/2016 4:04 PM    Scribe for Treatment Team:   Trish Mage, 05/19/2016 4:04 PM

## 2016-05-19 NOTE — [facility] Group Notes (Signed)
[facility]  05/19/2016 8:45 AM  Pt did not attend, declined invitation.   Chad CordialLauren Carter, LCSWA 05/19/2016 9:47 AM

## 2016-05-19 NOTE — Plan of Care (Signed)
Problem: Coping: Goal: Ability to cope will improve Outcome: Progressing Pt stated she was feeling better this evening

## 2016-05-19 NOTE — Progress Notes (Signed)
DAR NOTE: Patient presents with anxious affect and depressed mood.  Denies pain, auditory and visual hallucinations.  Rates depression at 7, hopelessness at 0, and anxiety at 5.  Maintained on routine safety checks.  Medications given as prescribed.  Support and encouragement offered as needed.  Attended group and participated.  States goal for today is "stop using."  Patient remained in her room most of this shift.  Minimal interaction with staff and peers.

## 2016-05-19 NOTE — Progress Notes (Signed)
[facility] Group Notes:  (Nursing/MHT/Case Management/Adjunct)  Date:  05/19/2016  Time:  8:29 PM  Type of Therapy:  Psychoeducational Skills  Participation Level:  Active  Participation Quality:  Appropriate  Affect:  Blunted  Cognitive:  Appropriate  Insight:  Good  Engagement in Group:  Engaged  Modes of Intervention:  Education  Summary of Progress/Problems: The patient shared with the group that she had a "pretty good" day overall. She explained further that she feels very drowsy due to her medication. As for the theme of the day, her coping skill will be to avoid "persons,places, and things" (stay away from bad people). The patient also states that she rested quite a bit today.   Hazle CocaGOODMAN, Alya Smaltz S 05/19/2016, 8:29 PM

## 2016-05-19 NOTE — Progress Notes (Signed)
  [facility] :  Will you be returning to the same living situation after discharge:  Yes,  returning home to her apt At discharge, do you have transportation home?: Yes,  per Dr. Elna BreslowEappen, tenative discharge scheduled for Sunday 05/21/16. Pt may require bus pass at d/c. Do you have the ability to pay for your medications: Yes,  Managed medicare  Release of information consent forms completed and submitted to medical records by CSW.  Patient to Follow up at: Follow-up Information    Follow up with [facility].   Why:  Walk in between 8am-9am Monday through Friday for hospital follow-up/medication management/assessment for couseling services. Thank you.    Contact information:   [address]  [place], [place] [address] Phone: [phone] Fax: [phone]      Next level of care provider [facility] Link:no  Safety Planning and Suicide Prevention discussed: Yes,  SPE completed with pt as she declined to consent to family contact. SPI Pamphlet, Mobile Crisis information provided to pt and she was encouraged to share infromation with support network.   Have you used any form of tobacco in the last 30 days? (Cigarettes, Smokeless Tobacco, Cigars, and/or Pipes): Yes  Has patient been referred to the Quitline?: Patient refused referral  Patient has been referred for addiction treatment: Yes  Smart, Rafferty Postlewait LCSW 05/19/2016, 4:03 PM

## 2016-05-19 NOTE — Progress Notes (Signed)
[facility]  05/19/2016 1:14 PM Hollice GongKimberly Weedon  MRN:  [id number] Subjective:  Patient states " all I want is my ensure.'  Objective:Patient is a 55 year old AA female who is single , on SSD , [place] in [place] , who has a hx of schizoaffective do ,who reported SI with a plan to overdose on her psychiatric medication or on drugs and also was having command AH to harm self.  Patient seen and chart reviewed.Discussed patient with treatment team.  Pt today seen in bed , avoids eye contact, is isolative , withdrawn. Pt continues to endorse SI , as well as AH as depression. Pt however reports she does not want any medication changes today since she knows the medications need time to get in to her system. Seroquel has always worked well for her. Per staff - pt does not attend groups , continues to need encouragement and support.      Principal Problem: Schizoaffective disorder, bipolar type (HCC) Diagnosis:   Patient Active Problem List   Diagnosis Date Noted  . Schizoaffective disorder, bipolar type (HCC) [F25.0] 05/18/2016  . Annual physical exam [Z00.00] 02/15/2016  . Encounter for routine gynecological examination [Z01.419] 02/15/2016  . Breast cancer screening [Z12.39] 02/15/2016  . Affective psychosis, bipolar (HCC) [F31.9]   . Cannabis use disorder, moderate, dependence (HCC) [F12.20] 09/22/2015  . Cocaine use disorder, moderate, dependence (HCC) [F14.20] 09/22/2015  . UTI (lower urinary tract infection) [N39.0] 09/04/2012   Total Time spent with patient: 25 minutes  Past Psychiatric History: Please see H&P.   Past Medical History:  Past Medical History  Diagnosis Date  . Depression   . Bipolar 1 disorder (HCC)   . Drug abuse   . Schizophrenia [facility](HCC)     Past Surgical History  Procedure Laterality Date  . No past surgeries     Family History:  Family History  Problem Relation Age of Onset  . Anesthesia problems Neg Hx   . Hypotension Neg Hx   . Malignant  hyperthermia Neg Hx   . Pseudochol deficiency Neg Hx   . Mental illness Maternal Aunt   . Cancer Maternal Aunt     breast   . Mental illness Paternal Aunt    Family Psychiatric  History: Please see H&P.  Social History:  History  Alcohol Use  . 1.8 oz/week  . 3 Cans of beer per week    Comment: once a week a 40 oz.      History  Drug Use  . Yes  . Special: Marijuana, Cocaine    Comment: occasionally    Social History   Social History  . Marital Status: Single    Spouse Name: N/A  . Number of Children: N/A  . Years of Education: N/A   Social History Main Topics  . Smoking status: Current Every Day Smoker -- 0.25 packs/day for 10 years    Types: Cigarettes, Cigars  . Smokeless tobacco: Never Used  . Alcohol Use: 1.8 oz/week    3 Cans of beer per week     Comment: once a week a 40 oz.   . Drug Use: Yes    Special: Marijuana, Cocaine     Comment: occasionally  . Sexual Activity: Yes    Birth Control/ Protection: Condom   Other Topics Concern  . None   Social History Narrative   Additional Social History:  Sleep: Fair  Appetite:  Poor  Current Medications: Current Facility-Administered Medications  Medication Dose Route Frequency Provider Last Rate Last Dose  . acetaminophen (TYLENOL) tablet 650 mg  650 mg Oral Q6H PRN Kerry Hough, PA-C      . alum & mag hydroxide-simeth (MAALOX/MYLANTA) 200-200-20 MG/5ML suspension 30 mL  30 mL Oral Q4H PRN Kerry Hough, PA-C      . feeding supplement (ENSURE ENLIVE) (ENSURE ENLIVE) liquid 237 mL  237 mL Oral BID BM Daltyn Degroat, MD      . lamoTRIgine (LAMICTAL) tablet 50 mg  50 mg Oral QPM Kerry Hough, PA-C   50 mg at 05/18/16 1834  . magnesium hydroxide (MILK OF MAGNESIA) suspension 30 mL  30 mL Oral Daily PRN Kerry Hough, PA-C      . multivitamin with minerals tablet 1 tablet  1 tablet Oral Q supper Rowin Bayron, MD      . nicotine polacrilex (NICORETTE) gum 2 mg  2 mg  Oral PRN Jomarie Longs, MD      . OLANZapine zydis (ZYPREXA) disintegrating tablet 10 mg  10 mg Oral TID PRN Jomarie Longs, MD       Or  . OLANZapine (ZYPREXA) injection 10 mg  10 mg Intramuscular TID PRN Jomarie Longs, MD      . QUEtiapine (SEROQUEL) tablet 400 mg  400 mg Oral QHS Jomarie Longs, MD   400 mg at 05/18/16 2124  . QUEtiapine (SEROQUEL) tablet 50 mg  50 mg Oral Daily Jomarie Longs, MD   50 mg at 05/19/16 0840  . traZODone (DESYREL) tablet 50 mg  50 mg Oral QHS,MR X 1 Kerry Hough, PA-C   50 mg at 05/18/16 2124    Lab Results:  Results for orders placed or performed during the hospital encounter of 05/17/16 (from the past 48 hour(s))  Pregnancy, urine     Status: None   Collection Time: 05/18/16  2:17 PM  Result Value Ref Range   Preg Test, Ur NEGATIVE NEGATIVE    Comment:        THE SENSITIVITY OF THIS METHODOLOGY IS >20 mIU/mL. Performed at [facility]   Rapid HIV screen (HIV 1/2 Ab+Ag)     Status: None   Collection Time: 05/18/16  6:06 PM  Result Value Ref Range   HIV-1 P24 Antigen - HIV24 NON REACTIVE NON REACTIVE   HIV 1/2 Antibodies NON REACTIVE NON REACTIVE   Interpretation (HIV Ag Ab)      A non reactive test result means that HIV 1 or HIV 2 antibodies and HIV 1 p24 antigen were not detected in the specimen.    Comment: Performed at [facility]  RPR     Status: None   Collection Time: 05/18/16  6:06 PM  Result Value Ref Range   RPR Ser Ql Non Reactive Non Reactive    Comment: (NOTE) Performed At: [facility] [address] [place], [place] [id number] Mila Homer MD [id number] Performed at [facility]   Hepatitis panel, acute     Status: None   Collection Time: 05/18/16  6:06 PM  Result Value Ref Range   Hepatitis B Surface Ag Negative Negative   HCV Ab <0.1 0.0 - 0.9 s/co ratio    Comment: (NOTE)  Negative:     < 0.8                              Indeterminate: 0.8 - 0.9                                  Positive:     > 0.9 The CDC recommends that a positive HCV antibody result be followed up with a HCV Nucleic Acid Amplification test (161096). Performed At: [facility] [address] [place], [place] [id number] Mila Homer MD BJ:[id number]    Hep A IgM Negative Negative   Hep B C IgM Negative Negative    Comment: Performed at [facility]    Blood Alcohol level:  Lab Results  Component Value Date   [facility] <5 05/16/2016   ETH 19* 09/21/2015    Physical Findings: AIMS: Facial and Oral Movements Muscles of Facial Expression: None, normal Lips and Perioral Area: None, normal Jaw: None, normal Tongue: None, normal,Extremity Movements Upper (arms, wrists, hands, fingers): None, normal Lower (legs, knees, ankles, toes): None, normal, Trunk Movements Neck, shoulders, hips: None, normal, Overall Severity Severity of abnormal movements (highest score from questions above): None, normal Incapacitation due to abnormal movements: None, normal Patient's awareness of abnormal movements (rate only patient's report): No Awareness, Dental Status Current problems with teeth and/or dentures?: No Does patient usually wear dentures?: No  CIWA:    COWS:     Musculoskeletal: Strength & Muscle Tone: within normal limits Gait & Station: normal Patient leans: N/A  Psychiatric Specialty Exam: Physical Exam  Review of Systems  Psychiatric/Behavioral: Positive for depression, hallucinations and substance abuse.  All other systems reviewed and are negative.   Blood pressure 119/80, pulse 81, temperature 97.9 F (36.6 C), temperature source Oral, resp. rate 18, height 4' 11.25" (1.505 m), weight 51.823 kg (114 lb 4 oz).Body mass index is 22.88 kg/(m^2).  General Appearance: Disheveled  Eye Contact:  Poor  Speech:  Slow  Volume:  Decreased  Mood:  Anxious and Depressed  Affect:  Constricted  Thought  Process:  Goal Directed and Descriptions of Associations: Intact  Orientation:  Full (Time, Place, and Person)  Thought Content:  Hallucinations: Auditory and Rumination  Suicidal Thoughts:  Yes.  without intent/plan improving  Homicidal Thoughts:  No  Memory:  Immediate;   Fair Recent;   Fair Remote;   Fair  Judgement:  Impaired  Insight:  Shallow  Psychomotor Activity:  Decreased  Concentration:  Concentration: Poor and Attention Span: Fair  Recall:  [employer] of Knowledge:  Fair  Language:  Fair  Akathisia:  No  Handed:  Right  AIMS (if indicated):     Assets:  Communication Skills Desire for Improvement  ADL's:  Intact  Cognition:  WNL  Sleep:  Number of Hours: 6.75     Treatment Plan Summary:Patient is a 55 year old AA female who is single , on SSD , who lives in [place] , who has a hx of schizoaffective do ,who reported SI with a plan to overdose on her psychiatric medication or on drugs and also was having command AH to harm self.Pt continues to be withdrawn , will continue treatment.   Daily contact with patient to assess and evaluate symptoms and progress in treatment and Medication management  Will continue Seroquel 50 mg po daily and 400 mg po qhs  for psychosis/mood sx.Pt does not want any medications changes today. Will continue Lamictal 50 mg po daily for mood sx. Will make available PRN medications as per agitation protocol.  Will continue to monitor vitals ,medication compliance and treatment side effects while patient is here.  Will monitor for medical issues as well as call consult as needed.  Reviewed labs cbc - wnl, cmp - creatinine slightly elevated , TSH ( 09/23/15) - WNL , Lipid panel, PL ( 09/23/15) - wnl , Hba1c- 02/15/16- 5.9 , pregnancy test- negative  , hiv- NR , gc/chl- Pending rpr- NR ,hepatitis- NR. CSW will continue working on disposition. Pt to be referred to a substance abuse program if she agrees. Patient to participate in therapeutic milieu .     Hawke Villalpando, MD 05/19/2016, 1:14 PM

## 2016-05-19 NOTE — Progress Notes (Signed)
D: Pt denies SI/HI/VH. +ve AH "a little" Pt is pleasant and cooperative. Pt  Stated she felt like she was doing better this evening. Pt observed sitting in the dayroom watching TV.  A: Pt was offered support and encouragement. Pt was given scheduled medications. Pt was encourage to attend groups. Q 15 minute checks were done for safety.    R:Pt attends groups and interacts well with peers and staff. Pt is taking medication. Pt has no complaints.Pt receptive to treatment and safety maintained on unit.

## 2016-05-19 NOTE — [facility] Suicide Risk Assessment (Signed)
[facility] INPATIENT:  Family/Significant Other Suicide Prevention Education  Suicide Prevention Education:  Patient Refusal for Family/Significant Other Suicide Prevention Education: The patient [name] has refused to provide written consent for family/significant other to be provided Family/Significant Other Suicide Prevention Education during admission and/or prior to discharge.  Physician notified.  SPE completed with pt, as pt refused to consent to family contact. SPI pamphlet provided to pt and pt was encouraged to share information with support network, ask questions, and talk about any concerns relating to SPE. Pt denies access to guns/firearms and verbalized understanding of information provided. Mobile Crisis information also provided to pt.   Smart, Jennings Stirling LCSW 05/19/2016, 4:02 PM

## 2016-05-20 MED ORDER — ADULT MULTIVITAMIN W/MINERALS CH
1.0000 | ORAL_TABLET | Freq: Every day | ORAL | Status: DC
  Administered 2016-05-21: 1 via ORAL
  Filled 2016-05-20 (×3): qty 1

## 2016-05-20 NOTE — [facility] Group Notes (Signed)
[facility] Group Notes:  (Nursing/MHT/Case Management/Adjunct)  Date:  05/20/2016  Time:  12:59 PM  Type of Therapy:  Nurse Education  Participation Level:  Did Not Attend   [name] 05/20/2016, 12:59 PM

## 2016-05-20 NOTE — Progress Notes (Signed)
   D: Pt informed the writer that she's still "hearing a little bit of voices". But states she wants to be "out by Monday", because she has "some business" to take care of. Stated she's ready to "deal with her problems on the outside".  Pt has no other questions or concerns.   A:  Support and encouragement was offered. 15 min checks continued for safety.  R: Pt remains safe.

## 2016-05-20 NOTE — [facility] Group Notes (Addendum)
[facility] Group Notes:  (Clinical Social Work)  05/20/2016  11:15-12:00PM  Summary of Progress/Problems:   Today's process group involved patients discussing their feelings related to being hospitalized. The patient expressed his/her primary feeling about being hospitalized is "OK" and said she is trying to get [facility].  Because many group members were highly anxious, most of group time was spent discussing suggestions for anxiety reduction, then trying each one.  This was very interactive.  The patient shared that she looks at magazines a great deal to try to calm her racing thoughts.  Type of Therapy:  Group Therapy - Process  Participation Level:  Minimal  Participation Quality:  Attentive  Affect:  Blunted  Cognitive:  Alert  Insight:  Improving  Engagement in Therapy:  Improving  Modes of Intervention:  Exploration, Discussion  Ambrose MantleMareida Grossman-Orr, LCSW 05/20/2016, 1:09 PM

## 2016-05-20 NOTE — Progress Notes (Signed)
[facility] Group Notes:  (Nursing/MHT/Case Management/Adjunct)  Date:  05/20/2016  Time:  9:13 PM  Type of Therapy:  Psychoeducational Skills  Participation Level:  Active  Participation Quality:  Appropriate  Affect:  Anxious  Cognitive:  Appropriate  Insight:  Improving  Engagement in Group:  Engaged  Modes of Intervention:  Education  Summary of Progress/Problems: The patient verbalized that she had a good day overall and that she felt more awake today. As for the theme of the day, her relapse prevention will include seeking 1 on 1 counseling.   Hazle CocaGOODMAN, Jonathan Kirkendoll S 05/20/2016, 9:13 PM

## 2016-05-20 NOTE — Progress Notes (Signed)
[facility] MD Progress Note  05/20/2016 1:25 PM Hollice GongKimberly Tromp  MRN:  [id number] Subjective:  Patient reports " I need to be discharged so I can go to mental health court."  Objective:Renai Murphey is awake, alert and oriented X4.Seen resting in bedroom. patient  Denies suicidal or homicidal ideation. Denies auditory or visual hallucination and does not appear to be responding to internal stimuli. Patient reports interacting well with staff and others. Patient reports she is medication compliant without mediation side effects. Report attending group session. States her depression 5/10. Reports she is ready to be discharged so that she can attend mental health court. Patient reports pending charges and need to get the charges dismissed. Reports good appetite and is resting well. Support, encouragement and reassurance was provided.   Principal Problem: Schizoaffective disorder, bipolar type (HCC) Diagnosis:   Patient Active Problem List   Diagnosis Date Noted  . Schizoaffective disorder, bipolar type (HCC) [F25.0] 05/18/2016  . Annual physical exam [Z00.00] 02/15/2016  . Encounter for routine gynecological examination [Z01.419] 02/15/2016  . Breast cancer screening [Z12.39] 02/15/2016  . Affective psychosis, bipolar (HCC) [F31.9]   . Cannabis use disorder, moderate, dependence (HCC) [F12.20] 09/22/2015  . Cocaine use disorder, moderate, dependence (HCC) [F14.20] 09/22/2015  . UTI (lower urinary tract infection) [N39.0] 09/04/2012   Total Time spent with patient: 25 minutes  Past Psychiatric History: Please see H&P.   Past Medical History:  Past Medical History  Diagnosis Date  . Depression   . Bipolar 1 disorder (HCC)   . Drug abuse   . Schizophrenia [facility](HCC)     Past Surgical History  Procedure Laterality Date  . No past surgeries     Family History:  Family History  Problem Relation Age of Onset  . Anesthesia problems Neg Hx   . Hypotension Neg Hx   . Malignant hyperthermia Neg  Hx   . Pseudochol deficiency Neg Hx   . Mental illness Maternal Aunt   . Cancer Maternal Aunt     breast   . Mental illness Paternal Aunt    Family Psychiatric  History: Please see H&P.  Social History:  History  Alcohol Use  . 1.8 oz/week  . 3 Cans of beer per week    Comment: once a week a 40 oz.      History  Drug Use  . Yes  . Special: Marijuana, Cocaine    Comment: occasionally    Social History   Social History  . Marital Status: Single    Spouse Name: N/A  . Number of Children: N/A  . Years of Education: N/A   Social History Main Topics  . Smoking status: Current Every Day Smoker -- 0.25 packs/day for 10 years    Types: Cigarettes, Cigars  . Smokeless tobacco: Never Used  . Alcohol Use: 1.8 oz/week    3 Cans of beer per week     Comment: once a week a 40 oz.   . Drug Use: Yes    Special: Marijuana, Cocaine     Comment: occasionally  . Sexual Activity: Yes    Birth Control/ Protection: Condom   Other Topics Concern  . None   Social History Narrative   Additional Social History:                         Sleep: Fair  Appetite:  Fair  Current Medications: Current Facility-Administered Medications  Medication Dose Route Frequency Provider Last Rate Last Dose  .  acetaminophen (TYLENOL) tablet 650 mg  650 mg Oral Q6H PRN Kerry Hough, PA-C      . alum & mag hydroxide-simeth (MAALOX/MYLANTA) 200-200-20 MG/5ML suspension 30 mL  30 mL Oral Q4H PRN Kerry Hough, PA-C      . feeding supplement (ENSURE ENLIVE) (ENSURE ENLIVE) liquid 237 mL  237 mL Oral BID BM Saramma Eappen, MD   237 mL at 05/19/16 1420  . lamoTRIgine (LAMICTAL) tablet 50 mg  50 mg Oral QPM Kerry Hough, PA-C   50 mg at 05/19/16 1717  . magnesium hydroxide (MILK OF MAGNESIA) suspension 30 mL  30 mL Oral Daily PRN Kerry Hough, PA-C      . [START ON 05/21/2016] multivitamin with minerals tablet 1 tablet  1 tablet Oral Q supper Saramma Eappen, MD      . nicotine polacrilex  (NICORETTE) gum 2 mg  2 mg Oral PRN Jomarie Longs, MD      . OLANZapine zydis (ZYPREXA) disintegrating tablet 10 mg  10 mg Oral TID PRN Jomarie Longs, MD       Or  . OLANZapine (ZYPREXA) injection 10 mg  10 mg Intramuscular TID PRN Jomarie Longs, MD      . QUEtiapine (SEROQUEL) tablet 400 mg  400 mg Oral QHS Jomarie Longs, MD   400 mg at 05/19/16 2128  . QUEtiapine (SEROQUEL) tablet 50 mg  50 mg Oral Daily Jomarie Longs, MD   50 mg at 05/20/16 0840  . traZODone (DESYREL) tablet 50 mg  50 mg Oral QHS,MR X 1 Kerry Hough, PA-C   50 mg at 05/18/16 2124    Lab Results:  Results for orders placed or performed during the hospital encounter of 05/17/16 (from the past 48 hour(s))  Pregnancy, urine     Status: None   Collection Time: 05/18/16  2:17 PM  Result Value Ref Range   Preg Test, Ur NEGATIVE NEGATIVE    Comment:        THE SENSITIVITY OF THIS METHODOLOGY IS >20 mIU/mL. Performed at [facility]   Rapid HIV screen (HIV 1/2 Ab+Ag)     Status: None   Collection Time: 05/18/16  6:06 PM  Result Value Ref Range   HIV-1 P24 Antigen - HIV24 NON REACTIVE NON REACTIVE   HIV 1/2 Antibodies NON REACTIVE NON REACTIVE   Interpretation (HIV Ag Ab)      A non reactive test result means that HIV 1 or HIV 2 antibodies and HIV 1 p24 antigen were not detected in the specimen.    Comment: Performed at [facility]  RPR     Status: None   Collection Time: 05/18/16  6:06 PM  Result Value Ref Range   RPR Ser Ql Non Reactive Non Reactive    Comment: (NOTE) Performed At: [facility] [address] [place], [place] [id number] Mila Homer MD WU:[id number] Performed at [facility]   Hepatitis panel, acute     Status: None   Collection Time: 05/18/16  6:06 PM  Result Value Ref Range   Hepatitis B Surface Ag Negative Negative   HCV Ab <0.1 0.0 - 0.9 s/co ratio    Comment: (NOTE)                                  Negative:     <  0.8  Indeterminate: 0.8 - 0.9                                  Positive:     > 0.9 The CDC recommends that a positive HCV antibody result be followed up with a HCV Nucleic Acid Amplification test (409811). Performed At: [facility] [address] [place], [place] [id number] Mila Homer MD OZ:[id number]    Hep A IgM Negative Negative   Hep B C IgM Negative Negative    Comment: Performed at [facility]    Blood Alcohol level:  Lab Results  Component Value Date   [facility] <5 05/16/2016   ETH 19* 09/21/2015    Physical Findings: AIMS: Facial and Oral Movements Muscles of Facial Expression: None, normal Lips and Perioral Area: None, normal Jaw: None, normal Tongue: None, normal,Extremity Movements Upper (arms, wrists, hands, fingers): None, normal Lower (legs, knees, ankles, toes): None, normal, Trunk Movements Neck, shoulders, hips: None, normal, Overall Severity Severity of abnormal movements (highest score from questions above): None, normal Incapacitation due to abnormal movements: None, normal Patient's awareness of abnormal movements (rate only patient's report): No Awareness, Dental Status Current problems with teeth and/or dentures?: No Does patient usually wear dentures?: No  CIWA:    COWS:     Musculoskeletal: Strength & Muscle Tone: within normal limits Gait & Station: normal Patient leans: N/A  Psychiatric Specialty Exam: Physical Exam  Nursing note and vitals reviewed. Constitutional: She is oriented to person, place, and time. She appears well-developed.  HENT:  Head: Normocephalic.  Neck: Neck supple.  Cardiovascular: Normal rate.   Musculoskeletal: Normal range of motion.  Neurological: She is oriented to person, place, and time.  Skin: Skin is warm and dry.  Psychiatric: She has a normal mood and affect.    Review of Systems  Psychiatric/Behavioral: Positive for depression, hallucinations and  substance abuse.  All other systems reviewed and are negative.   Blood pressure 119/80, pulse 81, temperature 97.9 F (36.6 C), temperature source Oral, resp. rate 18, height 4' 11.25" (1.505 m), weight 51.823 kg (114 lb 4 oz).Body mass index is 22.88 kg/(m^2).  General Appearance: Casual  Eye Contact:  Fair  Speech:  Clear and Coherent  Volume:  Normal  Mood:  Anxious and Depressed  Affect:  Congruent  Thought Process:  Goal Directed and Descriptions of Associations: Intact  Orientation:  Full (Time, Place, and Person)  Thought Content:  Hallucinations: Auditory and Rumination patient reports that the voices have quite.   Suicidal Thoughts:  No, denies during this assessment.  Homicidal Thoughts:  No  Memory:  Immediate;   Fair Recent;   Fair Remote;   Fair  Judgement:  Impaired  Insight:  Fair  Psychomotor Activity:  Normal  Concentration:  Attention Span: Fair  Recall:  [employer] of Knowledge:  Fair  Language:  Fair  Akathisia:  No  Handed:  Right  AIMS (if indicated):     Assets:  Communication Skills Desire for Improvement  ADL's:  Intact  Cognition:  WNL  Sleep:  Number of Hours: 6.75    I agree with current treatment plan on 05/20/2016, Patient seen face-to-face for psychiatric evaluation follow-up, chart reviewed. Reviewed the information documented and agree with the treatment plan.   Treatment Plan Summary: Daily contact with patient to assess and evaluate symptoms and progress in treatment and Medication management Will continue Seroquel 50 mg po  daily and 400 mg po qhs for psychosis/mood sx.Pt does not want any medications changes today. Will continue Lamictal 50 mg po daily for mood sx. Will make available PRN medications as per agitation protocol. Will continue to monitor vitals ,medication compliance and treatment side effects while patient is here.  Will monitor for medical issues as well as call consult as needed.  Reviewed labs cbc - wnl, cmp -  creatinine slightly elevated , TSH ( 09/23/15) - WNL , Lipid panel, PL ( 09/23/15) - wnl , Hba1c- 02/15/16- 5.9 , pregnancy test- negative  , hiv- NR , gc/chl- Pending rpr- NR ,hepatitis- NR. CSW will continue working on disposition. Pt to be referred to a substance abuse program if she agrees. Patient to participate in therapeutic milieu .    Oneta Rack, NP 05/20/2016, 1:25 PM Agree with NP Progress Note as above

## 2016-05-21 MED ORDER — QUETIAPINE FUMARATE 400 MG PO TABS: 400.0000 mg | ORAL_TABLET | Freq: Every day | ORAL | Status: DC

## 2016-05-21 MED ORDER — ADULT MULTIVITAMIN W/MINERALS CH: 1.0000 | ORAL_TABLET | Freq: Every day | ORAL | Status: DC

## 2016-05-21 MED ORDER — TRAZODONE HCL 50 MG PO TABS: 50.0000 mg | ORAL_TABLET | Freq: Every evening | ORAL | Status: DC | PRN

## 2016-05-21 MED ORDER — LAMOTRIGINE 25 MG PO TABS: 50.0000 mg | ORAL_TABLET | Freq: Every evening | ORAL | Status: DC

## 2016-05-21 MED ORDER — NICOTINE POLACRILEX 2 MG MT GUM: 2.0000 mg | CHEWING_GUM | OROMUCOSAL | Status: DC | PRN

## 2016-05-21 MED ORDER — QUETIAPINE FUMARATE 50 MG PO TABS: 50.0000 mg | ORAL_TABLET | Freq: Every day | ORAL | Status: DC

## 2016-05-21 NOTE — Discharge Summary (Signed)
Physician Discharge Summary Note  Patient:  [name] is an 55 y.o., female MRN:  [id number] DOB:  Apr 08, 1961 Patient phone:  [phone] (home)  Patient address:   [address] [place] [id number],  Total Time spent with patient: 30 minutes  Date of Admission:  05/17/2016 Date of Discharge: 05/21/2016 Reason for Admission:Per  ED Admission Note-AA female, 55 years old was evaluated for auditory hallucination with the voices telling her to kill herself. Patient is endorsing suicide with plans to use Cocaine and wine. She also states she want her Seroquel renewed because she has not been taking her Seroquel. Patient is worried about an upcoming court date for shop lifting. Patient admits to numerous Shop lifting and stealing. [facility] treatment of Bipolar disorder Patient reports poor sleep and appetite. She has been accepted for admission and we will be seeking placement at any facility with available bed.  Principal Problem: Schizoaffective disorder, bipolar type [facility](HCC) Discharge Diagnoses: Patient Active Problem List   Diagnosis Date Noted  . Schizoaffective disorder, bipolar type (HCC) [F25.0] 05/18/2016  . Annual physical exam [Z00.00] 02/15/2016  . Encounter for routine gynecological examination [Z01.419] 02/15/2016  . Breast cancer screening [Z12.39] 02/15/2016  . Affective psychosis, bipolar (HCC) [F31.9]   . Cannabis use disorder, moderate, dependence (HCC) [F12.20] 09/22/2015  . Cocaine use disorder, moderate, dependence (HCC) [F14.20] 09/22/2015  . UTI (lower urinary tract infection) [N39.0] 09/04/2012    Past Psychiatric History:  Past Medical History:  Past Medical History  Diagnosis Date  . Depression   . Bipolar 1 disorder (HCC)   . Drug abuse   . Schizophrenia [facility](HCC)     Past Surgical History  Procedure Laterality Date  . No past surgeries     Family History:  Family History  Problem  Relation Age of Onset  . Anesthesia problems Neg Hx   . Hypotension Neg Hx   . Malignant hyperthermia Neg Hx   . Pseudochol deficiency Neg Hx   . Mental illness Maternal Aunt   . Cancer Maternal Aunt     breast   . Mental illness Paternal Aunt    Family Psychiatric  History: See Above Social History:  History  Alcohol Use  . 1.8 oz/week  . 3 Cans of beer per week    Comment: once a week a 40 oz.      History  Drug Use  . Yes  . Special: Marijuana, Cocaine    Comment: occasionally    Social History   Social History  . Marital Status: Single    Spouse Name: N/A  . Number of Children: N/A  . Years of Education: N/A   Social History Main Topics  . Smoking status: Current Every Day Smoker -- 0.25 packs/day for 10 years    Types: Cigarettes, Cigars  . Smokeless tobacco: Never Used  . Alcohol Use: 1.8 oz/week    3 Cans of beer per week     Comment: once a week a 40 oz.   . Drug Use: Yes    Special: Marijuana, Cocaine     Comment: occasionally  . Sexual Activity: Yes    Birth Control/ Protection: Condom   Other Topics Concern  . None   Social History Narrative    Hospital Course: Stephanie GongKimberly Harkless was admitted for Schizoaffective disorder, bipolar type [facility](HCC) and crisis management.  Pt was treated discharged with the medications listed below under Medication List.  Medical problems were identified  and treated as needed.  Home medications were restarted as appropriate.  Improvement was monitored by observation and Stephanie Gong 's daily report of symptom reduction.  Emotional and mental status was monitored by daily self-inventory reports completed by Stephanie Gong and clinical staff.         [name] was evaluated by the treatment team for stability and plans for continued recovery upon discharge. [name] 's motivation was an integral factor for scheduling further treatment. Employment, transportation, bed availability, health status, family  support, and any pending legal issues were also considered during hospital stay. Pt was offered further treatment options upon discharge including but not limited to Residential, Intensive Outpatient, and Outpatient treatment.  [name] will follow up with the services as listed below under Follow Up Information.     Upon completion of this admission the patient was both mentally and medically stable for discharge denying suicidal/homicidal ideation, auditory/visual/tactile hallucinations, delusional thoughts and paranoia.    Stephanie Gong responded well to treatment with Lamictal,seroquel and trazodonewithout adverse effects. Pt demonstrated improvement without reported or observed adverse effects to the point of stability appropriate for outpatient management. Pertinent labs include:CMP, CBC, for which outpatient follow-up is necessary for lab recheck as mentioned below. Reviewed CBC, CMP, BAL, and UDS+ Cocaine; all unremarkable aside from noted exceptions.   Physical Findings: AIMS: Facial and Oral Movements Muscles of Facial Expression: None, normal Lips and Perioral Area: None, normal Jaw: None, normal Tongue: None, normal,Extremity Movements Upper (arms, wrists, hands, fingers): None, normal Lower (legs, knees, ankles, toes): None, normal, Trunk Movements Neck, shoulders, hips: None, normal, Overall Severity Severity of abnormal movements (highest score from questions above): None, normal Incapacitation due to abnormal movements: None, normal Patient's awareness of abnormal movements (rate only patient's report): No Awareness, Dental Status Current problems with teeth and/or dentures?: No Does patient usually wear dentures?: No  CIWA:    COWS:     Musculoskeletal: Strength & Muscle Tone: within normal limits Gait & Station: normal Patient leans: N/A  Psychiatric Specialty Exam: SEE SRA BY MD Physical Exam  Nursing note and vitals reviewed. Constitutional: She is  oriented to person, place, and time. She appears well-developed.  HENT:  Head: Normocephalic.  Cardiovascular: Normal rate.   Neurological: She is oriented to person, place, and time.  Skin: Skin is warm and dry.  Psychiatric: She has a normal mood and affect. Her behavior is normal. Thought content normal.    Review of Systems  Psychiatric/Behavioral: Negative for suicidal ideas and hallucinations. Depression: stable. Substance abuse: on admission.  All other systems reviewed and are negative.   Blood pressure 108/60, pulse 72, temperature 97.9 F (36.6 C), temperature source Oral, resp. rate 20, height 4' 11.25" (1.505 m), weight 51.823 kg (114 lb 4 oz).Body mass index is 22.88 kg/(m^2).    Have you used any form of tobacco in the last 30 days? (Cigarettes, Smokeless Tobacco, Cigars, and/or Pipes): Yes  Has this patient used any form of tobacco in the last 30 days? (Cigarettes, Smokeless Tobacco, Cigars, and/or Pipes) Yes, Yes, A prescription for an FDA-approved tobacco cessation medication was offered at discharge and the patient refused  Blood Alcohol level:  Lab Results  Component Value Date   [facility] <5 05/16/2016   ETH 19* 09/21/2015    Metabolic Disorder Labs:  Lab Results  Component Value Date   HGBA1C 5.9* 02/15/2016   MPG 123* 02/15/2016   MPG 123 09/23/2015   Lab Results  Component Value Date  PROLACTIN 14.3 09/23/2015   PROLACTIN 14.1 10/03/2013   Lab Results  Component Value Date   CHOL 163 02/15/2016   TRIG 59 02/15/2016   HDL 95 02/15/2016   CHOLHDL 1.7 02/15/2016   VLDL 12 02/15/2016   LDLCALC 56 02/15/2016   LDLCALC 75 09/23/2015    See Psychiatric Specialty Exam and Suicide Risk Assessment completed by Attending Physician prior to discharge.  Discharge destination:  Home  Is patient on multiple antipsychotic therapies at discharge:  No   Has Patient had three or more failed trials of antipsychotic monotherapy by history:  No  Recommended Plan  for Multiple Antipsychotic Therapies: NA  Discharge Instructions    Activity as tolerated - No restrictions    Complete by:  As directed      Diet general    Complete by:  As directed             Medication List    STOP taking these medications        acetaminophen 500 MG tablet  Commonly known as:  TYLENOL      TAKE these medications      Indication   lamoTRIgine 25 MG tablet  Commonly known as:  LAMICTAL  Take 2 tablets (50 mg total) by mouth every evening.   Indication:  mood stablilzation     multivitamin with minerals Tabs tablet  Take 1 tablet by mouth daily with supper.   Indication:  mtv     nicotine polacrilex 2 MG gum  Commonly known as:  NICORETTE  Take 1 each (2 mg total) by mouth as needed for smoking cessation.   Indication:  Nicotine Addiction     QUEtiapine 400 MG tablet  Commonly known as:  SEROQUEL  Take 1 tablet (400 mg total) by mouth at bedtime.   Indication:  Manic Phase of Manic-Depression, Mood control     QUEtiapine 50 MG tablet  Commonly known as:  SEROQUEL  Take 1 tablet (50 mg total) by mouth daily.   Indication:  mood stablilzation     traZODone 50 MG tablet  Commonly known as:  DESYREL  Take 1 tablet (50 mg total) by mouth at bedtime and may repeat dose one time if needed.   Indication:  Trouble Sleeping           Follow-up Information    Follow up with [facility].   Why:  Walk in between 8am-9am Monday through Friday for hospital follow-up/medication management/assessment for couseling services. Thank you.    Contact information:   [address] [place] [address] Phone: [phone] Fax: [phone]      Follow-up recommendations:  Activity:  as tolerated Diet:  heart healthy  Comments:  Take all medications as prescribed. Keep all follow-up appointments as scheduled.  Do not consume alcohol or use illegal drugs while on prescription medications. Report any adverse effects from your medications to your  primary care provider promptly.  In the event of recurrent symptoms or worsening symptoms, call 911, a crisis hotline, or go to the nearest emergency department for evaluation.   Signed: Oneta Rack, NP 05/21/2016, 8:31 AM   Patient seen, Suicide Assessment Completed.  Disposition Plan Reviewed

## 2016-05-21 NOTE — B[facility] Suicide Risk Assessment (Signed)
[facility]   Principal Problem: Schizoaffective disorder, bipolar type [facility](HCC) Discharge Diagnoses:  Patient Active Problem List   Diagnosis Date Noted  . Schizoaffective disorder, bipolar type (HCC) [F25.0] 05/18/2016  . Annual physical exam [Z00.00] 02/15/2016  . Encounter for routine gynecological examination [Z01.419] 02/15/2016  . Breast cancer screening [Z12.39] 02/15/2016  . Affective psychosis, bipolar (HCC) [F31.9]   . Cannabis use disorder, moderate, dependence (HCC) [F12.20] 09/22/2015  . Cocaine use disorder, moderate, dependence (HCC) [F14.20] 09/22/2015  . UTI (lower urinary tract infection) [N39.0] 09/04/2012    Total Time spent with patient: 30 minutes  Musculoskeletal: Strength & Muscle Tone: within normal limits Gait & Station: normal Patient leans: N/A  Psychiatric Specialty Exam: ROS denies headaches, denies chest pain, no shortness of breath,  No nausea, no vomiting ,no fever, no chills, no rash  Blood pressure 108/60, pulse 72, temperature 97.9 F (36.6 C), temperature source Oral, resp. rate 20, height 4' 11.25" (1.505 m), weight 114 lb 4 oz (51.823 kg).Body mass index is 22.88 kg/(m^2).  General Appearance: improved grooming   Eye Contact::  Good  Speech:  Normal Rate409  Volume:  Normal  Mood:  states mood is "OK", denies depression   Affect:  Appropriate  Thought Process:  Linear  Orientation:  Full (Time, Place, and Person)  Thought Content:  denies hallucinations, no delusions, not internally preoccupied  Suicidal Thoughts:  No- denies any suicidal or self injurious ideations   Homicidal Thoughts:  Nodenies any homicidal ideations   Memory:  recent and remote grossly intact   Judgement:  Other:  improving   Insight:  improving   Psychomotor Activity:  Normal  Concentration:  Good  Recall:  Good  Fund of Knowledge:Good  Language: Good  Akathisia:  Negative  Handed:  Right  AIMS (if indicated):     Assets:  Desire  for Improvement Resilience  Sleep:  Number of Hours: 5  Cognition: WNL  ADL's:  Intact   Mental Status Per Nursing Assessment::   On Admission:     Demographic Factors:  55 year old single female, lives alone , on disability   Loss Factors: Legal issues, relapsed about two weeks ago after several months of sobriety   Historical Factors: History of Schizoaffective Disorder, history of Cocaine and Cannabis abuse .   Risk Reduction Factors:   Positive coping skills or problem solving skills  Continued Clinical Symptoms:  At this time patient is alert and attentive, well related, pleasant, mood is described as "OK", denies depression, affect is appropriate, reactive, calm, no thought disorder noted at this time, no suicidal or homicidal ideations, no hallucinations, no delusions, future oriented, plans to return home, and plans to attend upcoming court date. States she is motivated in maintaining sobriety   Cognitive Features That Contribute To Risk:  No gross cognitive deficits noted upon discharge. Is alert , attentive, and oriented x 3    Suicide Risk:  Mild:  Suicidal ideation of limited frequency, intensity, duration, and specificity.  There are no identifiable plans, no associated intent, mild dysphoria and related symptoms, good self-control (both objective and subjective assessment), few other risk factors, and identifiable protective factors, including available and accessible social support.  Follow-up Information    Follow up with [facility].   Why:  Walk in between 8am-9am Monday through Friday for hospital follow-up/medication management/assessment for couseling services. Thank you.    Contact information:   [address]  [place], [place] [address] Phone: [phone] Fax: [phone]  Plan Of Care/Follow-up recommendations:  Activity:  as tolerated  Diet:  Regular  Tests:  NA Other:  see below  Patient is requesting discharge and at this time there are no  grounds for involuntary commitment  Patient is leaving unit in good spirits States she is motivated and focused on maintaining sobriety Follow up as above  Nehemiah Massed, MD 05/21/2016, 10:13 AM

## 2016-05-21 NOTE — Progress Notes (Signed)
D: Pt denies SI/HI/VH. Pt +ve AH- little bit  Pt is pleasant and cooperative. Pt stated she was ready to go.   A: Pt was offered support and encouragement. Pt was given scheduled medications. Pt was encourage to attend groups. Q 15 minute checks were done for safety.  R:Pt attends groups and interacts well with peers and staff. Pt is taking medication. Pt has no complaints.Pt receptive to treatment and safety maintained on unit.

## 2016-05-21 NOTE — Progress Notes (Signed)
D) Pt is being discharged to home. Mood and affect are appropriate. Pt. Denies SI and HI, delusions and hallucinations. Pt rates her depression at a 0, hopelessness at an 8 and her anxiety at a 0.  A) Given support, reassurance and praise. Provided with a 1:1. Encouragement given. All belongings returned to Pt. All medications explained and samples given to Pt. Pt also given a bus pass. All follow up plans explained to Pt. Pt given a bus pass to get home R) Denies SI and HI.

## 2016-05-21 NOTE — BHH Group Notes (Signed)
BHH Group Notes: (Clinical Social Work)   05/21/2016      Type of Therapy:  Group Therapy   Participation Level:  Did Not Attend despite MHT prompting   Lorissa Kishbaugh Grossman-Orr, LCSW 05/21/2016, 11:41 AM     

## 2016-06-29 ENCOUNTER — Encounter (HOSPITAL_COMMUNITY): Payer: Self-pay | Admitting: Nurse Practitioner

## 2016-06-29 ENCOUNTER — Emergency Department (HOSPITAL_COMMUNITY)
Admission: EM | Admit: 2016-06-29 | Discharge: 2016-06-29 | Disposition: A | Discharge status: Home / Self Care | Payer: Medicare Other | Attending: Emergency Medicine | Admitting: Emergency Medicine

## 2016-06-29 ENCOUNTER — Emergency Department (HOSPITAL_COMMUNITY): Payer: Medicare Other

## 2016-06-29 DIAGNOSIS — S00211A Abrasion of right eyelid and periocular area, initial encounter: Secondary | ICD-10-CM | POA: Diagnosis not present

## 2016-06-29 DIAGNOSIS — F17210 Nicotine dependence, cigarettes, uncomplicated: Secondary | ICD-10-CM | POA: Insufficient documentation

## 2016-06-29 DIAGNOSIS — S8012XA Contusion of left lower leg, initial encounter: Secondary | ICD-10-CM | POA: Insufficient documentation

## 2016-06-29 DIAGNOSIS — Y929 Unspecified place or not applicable: Secondary | ICD-10-CM | POA: Insufficient documentation

## 2016-06-29 DIAGNOSIS — W109XXA Fall (on) (from) unspecified stairs and steps, initial encounter: Secondary | ICD-10-CM | POA: Insufficient documentation

## 2016-06-29 DIAGNOSIS — Y939 Activity, unspecified: Secondary | ICD-10-CM | POA: Insufficient documentation

## 2016-06-29 DIAGNOSIS — R51 Headache: Secondary | ICD-10-CM | POA: Diagnosis not present

## 2016-06-29 DIAGNOSIS — W19XXXA Unspecified fall, initial encounter: Secondary | ICD-10-CM

## 2016-06-29 DIAGNOSIS — S79911A Unspecified injury of right hip, initial encounter: Secondary | ICD-10-CM

## 2016-06-29 DIAGNOSIS — M25461 Effusion, right knee: Secondary | ICD-10-CM | POA: Diagnosis not present

## 2016-06-29 DIAGNOSIS — Y999 Unspecified external cause status: Secondary | ICD-10-CM | POA: Diagnosis not present

## 2016-06-29 DIAGNOSIS — M542 Cervicalgia: Secondary | ICD-10-CM | POA: Diagnosis not present

## 2016-06-29 DIAGNOSIS — S7011XA Contusion of right thigh, initial encounter: Secondary | ICD-10-CM | POA: Insufficient documentation

## 2016-06-29 DIAGNOSIS — S79921A Unspecified injury of right thigh, initial encounter: Secondary | ICD-10-CM | POA: Diagnosis present

## 2016-06-29 LAB — COMPREHENSIVE METABOLIC PANEL
ALBUMIN: 3.5 g/dL (ref 3.5–5.0)
ALK PHOS: 60 U/L (ref 38–126)
ALT: 20 U/L (ref 14–54)
ANION GAP: 5 (ref 5–15)
AST: 39 U/L (ref 15–41)
BILIRUBIN TOTAL: 1.1 mg/dL (ref 0.3–1.2)
BUN: 13 mg/dL (ref 6–20)
CO2: 24 mmol/L (ref 22–32)
Calcium: 9.4 mg/dL (ref 8.9–10.3)
Chloride: 110 mmol/L (ref 101–111)
Creatinine, Ser: 0.76 mg/dL (ref 0.44–1.00)
GFR calc Af Amer: >60 mL/min (ref >60)
GFR calc non Af Amer: >60 mL/min (ref >60)
GLUCOSE: 96 mg/dL (ref 65–99)
POTASSIUM: 5.2 mmol/L — AB (ref 3.5–5.1)
Sodium: 139 mmol/L (ref 135–145)
TOTAL PROTEIN: 6.5 g/dL (ref 6.5–8.1)

## 2016-06-29 LAB — PROTIME-INR
INR: 0.94 (ref 0.00–1.49)
PROTHROMBIN TIME: 12.8 s (ref 11.6–15.2)

## 2016-06-29 LAB — CBC WITH DIFFERENTIAL/PLATELET
Basophils Absolute: 0 10*3/uL (ref 0.0–0.1)
Basophils Relative: 1 %
Eosinophils Absolute: 0.3 10*3/uL (ref 0.0–0.7)
Eosinophils Relative: 5 %
HEMATOCRIT: 39.8 % (ref 36.0–46.0)
HEMOGLOBIN: 13.1 g/dL (ref 12.0–15.0)
LYMPHS ABS: 2.4 10*3/uL (ref 0.7–4.0)
LYMPHS PCT: 39 %
MCH: 29.4 pg (ref 26.0–34.0)
MCHC: 32.9 g/dL (ref 30.0–36.0)
MCV: 89.2 fL (ref 78.0–100.0)
MONO ABS: 0.5 10*3/uL (ref 0.1–1.0)
MONOS PCT: 9 %
NEUTROS ABS: 2.9 10*3/uL (ref 1.7–7.7)
NEUTROS PCT: 46 %
Platelets: 279 10*3/uL (ref 150–400)
RBC: 4.46 MIL/uL (ref 3.87–5.11)
RDW: 14.7 % (ref 11.5–15.5)
WBC: 6.2 10*3/uL (ref 4.0–10.5)

## 2016-06-29 MED ORDER — HYDROCODONE-ACETAMINOPHEN 5-325 MG PO TABS
2.0000 | ORAL_TABLET | Freq: Once | ORAL | Status: AC
  Administered 2016-06-29: 2 via ORAL
  Filled 2016-06-29: qty 2

## 2016-06-29 MED ORDER — SODIUM CHLORIDE 0.9 % IV BOLUS (SEPSIS)
1000.0000 mL | Freq: Once | INTRAVENOUS | Status: AC
  Administered 2016-06-29: 1000 mL via INTRAVENOUS

## 2016-06-29 MED ORDER — CYCLOBENZAPRINE HCL 10 MG PO TABS: 5.0000 mg | ORAL_TABLET | Freq: Once | ORAL | Status: DC

## 2016-06-29 MED ORDER — HYDROCODONE-ACETAMINOPHEN 5-325 MG PO TABS: 1.0000 | ORAL_TABLET | Freq: Four times a day (QID) | ORAL | Status: DC | PRN

## 2016-06-29 MED ORDER — IBUPROFEN 200 MG PO TABS: 400.0000 mg | ORAL_TABLET | Freq: Once | ORAL | Status: DC

## 2016-06-29 NOTE — Discharge Instructions (Signed)
Take motrin for pain.   Use crutches for comfort.   Take vicodin for severe pain. Do NOT drive with it.   See your doctor.   Return to ER if you have severe pain, unable to walk, worse leg swelling, severe headaches.

## 2016-06-29 NOTE — Progress Notes (Signed)
Orthopedic Tech Progress Note Patient Details:  [name] [birth year] [id number]  Ortho Devices Type of Ortho Device: Crutches Ortho Device/Splint Interventions: Ordered, Adjustment   [name], Rande Dario Craig 06/29/2016, 10:44 PM

## 2016-06-29 NOTE — ED Notes (Signed)
She c/o 4 day history neck, lip, leg pain after falling down stairs. Today she began to have a headache and dizziness so decided to seek medical evaluation. She denies any LOC. She is alert, breathing easily.

## 2016-06-29 NOTE — ED Provider Notes (Signed)
CSN: [id number]     Arrival date & time 06/29/16  1547 History   First MD Initiated Contact with Patient 06/29/16 1831     Chief Complaint  Patient presents with  . Fall     (Consider location/radiation/quality/duration/timing/severity/associated sxs/prior Treatment) The history is provided by the patient.  [name] is a 55 y.o. female hx of depression, bipolar, schizophrenia, Who presenting with fall. Patient was trying to sneak out of her friend's house 4 days ago and then lost her glasses and tripped and fell and hit her head and right leg. States that she has progressive swelling of the right thigh and has trouble bearing weight on the right leg. She began having headaches today and some dizziness. Also has some neck pain as well. Denies being on any blood thinners or having any bleeding disorders.   Past Medical History  Diagnosis Date  . Depression   . Bipolar 1 disorder (HCC)   . Drug abuse   . Schizophrenia [facility](HCC)    Past Surgical History  Procedure Laterality Date  . No past surgeries     Family History  Problem Relation Age of Onset  . Anesthesia problems Neg Hx   . Hypotension Neg Hx   . Malignant hyperthermia Neg Hx   . Pseudochol deficiency Neg Hx   . Mental illness Maternal Aunt   . Cancer Maternal Aunt     breast   . Mental illness Paternal Aunt    Social History  Substance Use Topics  . Smoking status: Current Every Day Smoker -- 0.25 packs/day for 10 years    Types: Cigarettes, Cigars  . Smokeless tobacco: Never Used  . Alcohol Use: 1.8 oz/week    3 Cans of beer per week     Comment: once a week a 40 oz.    OB History    No data available     Review of Systems  Musculoskeletal:       R knee and hip and leg pain.   Neurological: Positive for dizziness and headaches.  All other systems reviewed and are negative.     Allergies  Review of patient's allergies indicates no known allergies.  Home Medications   Prior to Admission  medications   Medication Sig Start Date End Date Taking? Authorizing Provider  acetaminophen (TYLENOL) 500 MG tablet Take 500-1,000 mg by mouth every 6 (six) hours as needed for mild pain, moderate pain or headache.   Yes Historical Provider, MD  lamoTRIgine (LAMICTAL) 25 MG tablet Take 2 tablets (50 mg total) by mouth every evening. 05/21/16  Yes Oneta Rackanika N Lewis, NP  Multiple Vitamin (MULTIVITAMIN WITH MINERALS) TABS tablet Take 1 tablet by mouth daily with supper. 05/21/16  Yes Oneta Rackanika N Lewis, NP  QUEtiapine (SEROQUEL) 400 MG tablet Take 1 tablet (400 mg total) by mouth at bedtime. 05/21/16  Yes Oneta Rackanika N Lewis, NP  QUEtiapine (SEROQUEL) 50 MG tablet Take 1 tablet (50 mg total) by mouth daily. Patient taking differently: Take 50 mg by mouth every morning.  05/21/16  Yes Oneta Rackanika N Lewis, NP  nicotine polacrilex (NICORETTE) 2 MG gum Take 1 each (2 mg total) by mouth as needed for smoking cessation. Patient not taking: Reported on 06/29/2016 05/21/16   Oneta Rackanika N Lewis, NP  traZODone (DESYREL) 50 MG tablet Take 1 tablet (50 mg total) by mouth at bedtime and may repeat dose one time if needed. Patient not taking: Reported on 06/29/2016 05/21/16   Oneta Rackanika N Lewis, NP   BP 126/67  mmHg  Pulse 57  Temp(Src) 97.9 F (36.6 C) (Oral)  Resp 18  SpO2 96% Physical Exam  Constitutional: She is oriented to person, place, and time.  Uncomfortable   HENT:  Head: Normocephalic.  Abrasion above R eyelid, no obvious laceration, small hematoma around it   Eyes: Conjunctivae are normal. Pupils are equal, round, and reactive to light.  Extra ocular movements intact   Neck: Normal range of motion. Neck supple.  R paracervical tenderness   Cardiovascular: Normal rate, regular rhythm and normal heart sounds.   Pulmonary/Chest: Effort normal and breath sounds normal. No respiratory distress. She has no wheezes. She has no rales.  Abdominal: Soft. Bowel sounds are normal. She exhibits no distension. There is no tenderness.  There is no rebound.  Musculoskeletal:  R thigh hematoma lateral aspect. Nl ROM R hip. R knee effusion and small hematoma but nl ROM. Small bruising L calf but no bony tenderness   Neurological: She is alert and oriented to person, place, and time.  Skin: Skin is warm and dry.  Psychiatric: She has a normal mood and affect. Her behavior is normal. Judgment and thought content normal.  Nursing note and vitals reviewed.   ED Course  Procedures (including critical care time) Labs Review Labs Reviewed  COMPREHENSIVE METABOLIC PANEL - Abnormal; Notable for the following:    Potassium 5.2 (*)    All other components within normal limits  CBC WITH DIFFERENTIAL/PLATELET  PROTIME-INR    Imaging Review Dg Chest 1 View  06/29/2016  CLINICAL DATA:  Larey Seat down stairs 4 days ago.  Chest pain. EXAM: CHEST 1 VIEW COMPARISON:  09/24/2006 FINDINGS: The heart size and mediastinal contours are within normal limits. Both lungs are clear. The visualized skeletal structures are unremarkable. IMPRESSION: No active disease. Electronically Signed   By: Paulina Fusi M.D.   On: 06/29/2016 20:56   Dg Cervical Spine Complete  06/29/2016  CLINICAL DATA:  Larey Seat down steps 4 days ago.  Neck pain. EXAM: CERVICAL SPINE - COMPLETE 4+ VIEW COMPARISON:  None. FINDINGS: Alignment is normal. No visible fracture. No soft tissue swelling. Mid cervical spondylosis. IMPRESSION: No acute or traumatic finding.  Ordinary mid cervical spondylosis. Electronically Signed   By: Paulina Fusi M.D.   On: 06/29/2016 20:57   Dg Pelvis 1-2 Views  06/29/2016  CLINICAL DATA:  Larey Seat down stairs 4 days ago.  Pain. EXAM: PELVIS - 1-2 VIEW COMPARISON:  None. FINDINGS: There is no evidence of pelvic fracture or diastasis. No pelvic bone lesions are seen. IMPRESSION: Negative. Electronically Signed   By: Paulina Fusi M.D.   On: 06/29/2016 20:55   Ct Head Wo Contrast  06/29/2016  CLINICAL DATA:  Four-day history of neck lip and leg pain after falling down  stairs, today began having headaches and dizziness, no loss of consciousness, has RIGHT supraorbital abrasion, head injury post fall, history smoking EXAM: CT HEAD WITHOUT CONTRAST TECHNIQUE: Contiguous axial images were obtained from the base of the skull through the vertex without intravenous contrast. COMPARISON:  09/14/2006 FINDINGS: Normal ventricular morphology. No midline shift or mass effect. Normal appearance of brain parenchyma. No intracranial hemorrhage, mass lesion or evidence acute infarction. No extra-axial fluid collections. Subtotal opacification of sphenoid sinus. Remaining visualized paranasal sinuses and mastoid air cells clear. No acute osseous findings. IMPRESSION: No acute intracranial abnormalities. Sphenoid sinus disease. Electronically Signed   By: Ulyses Southward M.D.   On: 06/29/2016 19:59   Dg Knee Complete 4 Views Right  06/29/2016  CLINICAL DATA:  Larey SeatFell down stairs 4 days ago.  Pain. EXAM: RIGHT KNEE - COMPLETE 4+ VIEW COMPARISON:  None. FINDINGS: Small knee joint effusion. No fracture dislocation. No degenerative change. IMPRESSION: Small joint effusion.  Otherwise negative. Electronically Signed   By: Paulina FusiMark  Shogry M.D.   On: 06/29/2016 20:55   Dg Femur, Min 2 Views Right  06/29/2016  CLINICAL DATA:  Larey SeatFell down stairs 4 days ago.  Pain. EXAM: RIGHT FEMUR 2 VIEWS COMPARISON:  None. FINDINGS: There is no evidence of fracture or other focal bone lesions. Soft tissues are unremarkable. IMPRESSION: Negative. Electronically Signed   By: Paulina FusiMark  Shogry M.D.   On: 06/29/2016 20:56   I have personally reviewed and evaluated these images and lab results as part of my medical decision-making.   EKG Interpretation None      MDM   Final diagnoses:  Fall    [name] is a 55 y.o. female here with thigh hematoma, headache, neck pain after fall. She has a large thigh hematoma. No hx of bleeding disorders and not on blood thinners. Will check labs and coags today. Will get xrays, CT  head.   10:04 PM Xrays showed no fracture, small R knee effusion. Has been ambulating prior to arrival. Pain controlled, now eating a sandwich. Hg and platelets and coags nl. Likely just thigh contusion. Will dc home with crutches for comfort.    Richardean Canalavid H Yao, MD 06/29/16 2206

## 2016-09-03 ENCOUNTER — Encounter (HOSPITAL_COMMUNITY): Payer: Self-pay | Admitting: Emergency Medicine

## 2016-09-03 ENCOUNTER — Emergency Department (HOSPITAL_COMMUNITY)
Admission: EM | Admit: 2016-09-03 | Discharge: 2016-09-05 | Disposition: A | Discharge status: Home / Self Care | Payer: Medicare Other | Attending: Emergency Medicine | Admitting: Emergency Medicine

## 2016-09-03 DIAGNOSIS — Z79899 Other long term (current) drug therapy: Secondary | ICD-10-CM | POA: Insufficient documentation

## 2016-09-03 DIAGNOSIS — F1499 Cocaine use, unspecified with unspecified cocaine-induced disorder: Secondary | ICD-10-CM | POA: Diagnosis not present

## 2016-09-03 DIAGNOSIS — Z853 Personal history of malignant neoplasm of breast: Secondary | ICD-10-CM | POA: Insufficient documentation

## 2016-09-03 DIAGNOSIS — F17210 Nicotine dependence, cigarettes, uncomplicated: Secondary | ICD-10-CM | POA: Insufficient documentation

## 2016-09-03 DIAGNOSIS — R45851 Suicidal ideations: Secondary | ICD-10-CM

## 2016-09-03 DIAGNOSIS — Z79891 Long term (current) use of opiate analgesic: Secondary | ICD-10-CM | POA: Insufficient documentation

## 2016-09-03 DIAGNOSIS — F323 Major depressive disorder, single episode, severe with psychotic features: Secondary | ICD-10-CM | POA: Diagnosis not present

## 2016-09-03 DIAGNOSIS — F250 Schizoaffective disorder, bipolar type: Secondary | ICD-10-CM | POA: Diagnosis not present

## 2016-09-03 DIAGNOSIS — F1420 Cocaine dependence, uncomplicated: Secondary | ICD-10-CM

## 2016-09-03 LAB — RAPID URINE DRUG SCREEN, HOSP PERFORMED
AMPHETAMINES: NOT DETECTED
BARBITURATES: NOT DETECTED
Benzodiazepines: NOT DETECTED
Cocaine: NOT DETECTED
Opiates: NOT DETECTED
TETRAHYDROCANNABINOL: NOT DETECTED

## 2016-09-03 LAB — COMPREHENSIVE METABOLIC PANEL
ALK PHOS: 66 U/L (ref 38–126)
ALT: 22 U/L (ref 14–54)
ANION GAP: 8 (ref 5–15)
AST: 29 U/L (ref 15–41)
Albumin: 4.3 g/dL (ref 3.5–5.0)
BILIRUBIN TOTAL: 0.4 mg/dL (ref 0.3–1.2)
BUN: 28 mg/dL — ABNORMAL HIGH (ref 6–20)
CALCIUM: 9.5 mg/dL (ref 8.9–10.3)
CO2: 25 mmol/L (ref 22–32)
Chloride: 106 mmol/L (ref 101–111)
Creatinine, Ser: 1.03 mg/dL — ABNORMAL HIGH (ref 0.44–1.00)
GFR calc non Af Amer: >60 mL/min (ref >60)
Glucose, Bld: 111 mg/dL — ABNORMAL HIGH (ref 65–99)
POTASSIUM: 4.3 mmol/L (ref 3.5–5.1)
Sodium: 139 mmol/L (ref 135–145)
TOTAL PROTEIN: 7.3 g/dL (ref 6.5–8.1)

## 2016-09-03 LAB — CBC
HCT: 41.1 % (ref 36.0–46.0)
HEMOGLOBIN: 13.8 g/dL (ref 12.0–15.0)
MCH: 29.2 pg (ref 26.0–34.0)
MCHC: 33.6 g/dL (ref 30.0–36.0)
MCV: 86.9 fL (ref 78.0–100.0)
PLATELETS: 246 10*3/uL (ref 150–400)
RBC: 4.73 MIL/uL (ref 3.87–5.11)
RDW: 15.1 % (ref 11.5–15.5)
WBC: 6.1 10*3/uL (ref 4.0–10.5)

## 2016-09-03 LAB — ETHANOL

## 2016-09-03 LAB — ACETAMINOPHEN LEVEL

## 2016-09-03 LAB — SALICYLATE LEVEL: Salicylate Lvl: <4 mg/dL (ref 2.8–30.0)

## 2016-09-03 MED ORDER — NICOTINE 21 MG/24HR TD PT24: 21.0000 mg | MEDICATED_PATCH | Freq: Every day | TRANSDERMAL | Status: DC | PRN

## 2016-09-03 MED ORDER — ZOLPIDEM TARTRATE 5 MG PO TABS
5.0000 mg | ORAL_TABLET | Freq: Every evening | ORAL | Status: DC | PRN
  Administered 2016-09-04: 5 mg via ORAL
  Filled 2016-09-03: qty 1

## 2016-09-03 MED ORDER — ALUM & MAG HYDROXIDE-SIMETH 200-200-20 MG/5ML PO SUSP: 30.0000 mL | ORAL | Status: DC | PRN

## 2016-09-03 MED ORDER — IBUPROFEN 200 MG PO TABS: 600.0000 mg | ORAL_TABLET | Freq: Three times a day (TID) | ORAL | Status: DC | PRN

## 2016-09-03 MED ORDER — ACETAMINOPHEN 325 MG PO TABS
650.0000 mg | ORAL_TABLET | ORAL | Status: DC | PRN
  Administered 2016-09-04 (×2): 650 mg via ORAL
  Filled 2016-09-03 (×2): qty 2

## 2016-09-03 MED ORDER — ONDANSETRON HCL 4 MG PO TABS: 4.0000 mg | ORAL_TABLET | Freq: Three times a day (TID) | ORAL | Status: DC | PRN

## 2016-09-03 NOTE — BH Assessment (Signed)
Tele Assessment Note   [name] is an 55 y.o. female presenting to [facility] reporting suicidal ideation but denies having a plan or intent. Pt reported that she is depressed due to not handling a particular situation well. Pt did not disclose any information regarding the situation. PT reported that she has attempted suicide multiple times in the past and shared that she has had several psychiatric hospitalizations. Pt reported that she is currently receiving mental health treatment through [facility] and reported that she is compliant with her medication. Pt is also reporting auditory hallucinations and stated "I can hear my daughter's voice but no one is there". Pt shared that she has experienced hallucinations in the past but not when she was med compliant. Pt is reporting multiple depressive symptoms and shared that her sleep has decreased and her appetite is poor. Pt reported that she uses cocaine, smokes marijuana and drinks alcohol; however her UDS and BAL in undetectable at this time. Pt did not report any physical, sexual or emotional abuse.   Diagnosis: Major Depressive Disorder, Recurrent episode with psychotic features.  Past Medical History:  Past Medical History:  Diagnosis Date  . Bipolar 1 disorder (HCC)   . Depression   . Drug abuse   . Schizophrenia [facility])     Past Surgical History:  Procedure Laterality Date  . NO PAST SURGERIES      Family History:  Family History  Problem Relation Age of Onset  . Mental illness Maternal Aunt   . Cancer Maternal Aunt     breast   . Mental illness Paternal Aunt   . Anesthesia problems Neg Hx   . Hypotension Neg Hx   . Malignant hyperthermia Neg Hx   . Pseudochol deficiency Neg Hx     Social History:  reports that she has been smoking Cigarettes and Cigars.  She has a 2.50 pack-year smoking history. She has never used smokeless tobacco. She reports that she drinks about 1.8 oz of alcohol per week . She reports that she uses drugs,  including Marijuana and Cocaine.  Additional Social History:  Alcohol / Drug Use History of alcohol / drug use?: Yes Substance #1 Name of Substance 1: Alcohol  1 - Age of First Use: 18 1 - Amount (size/oz): 40oz 1 - Frequency: weekly  1 - Duration: ongoin  1 - Last Use / Amount: pt unable to recall  Substance #2 Name of Substance 2: Cocaine  2 - Age of First Use: 42 2 - Amount (size/oz): $50 2 - Frequency: weekly  2 - Duration: ongoing  2 - Last Use / Amount: "3 weeks ago"  Substance #3 Name of Substance 3: THC  3 - Age of First Use: 17 3 - Amount (size/oz): unknown  3 - Frequency: monthly  3 - Duration: ongoing  3 - Last Use / Amount: "1 month ago"   CIWA: CIWA-Ar BP: 126/84 Pulse Rate: 82 COWS:    PATIENT STRENGTHS: (choose at least two) Average or above average intelligence Motivation for treatment/growth  Allergies: No Known Allergies  Home Medications:  (Not in a hospital admission)  OB/GYN Status:  No LMP recorded (exact date). Patient is not currently having periods (Reason: Perimenopausal).  General Assessment Data [facility] of [facility]: [facility] TTS Assessment: In system Is this a Tele or Face-to-Face Assessment?: Face-to-Face Is this an Initial Assessment or a Re-assessment for this encounter?: Initial Assessment Marital status: Single Is patient pregnant?: No Pregnancy Status: No Living Arrangements: Alone Can pt return to  current living arrangement?: Yes Admission Status: Voluntary Is patient capable of signing voluntary admission?: Yes Referral Source: Self/Family/Friend Insurance type: [employer]      Crisis Care Plan Living Arrangements: Alone [facility]  Name of Therapist: [facility]   Education Status Is patient currently in school?: No  Risk to self with the past 6 months Suicidal Ideation: Yes-Currently Present Has patient been a risk to self within the past 6 months prior to admission? : No Suicidal Intent:  No Has patient had any suicidal intent within the past 6 months prior to admission? : No Is patient at risk for suicide?: Yes Suicidal Plan?: No Has patient had any suicidal plan within the past 6 months prior to admission? : No Access to Means: No What has been your use of drugs/alcohol within the last 12 months?: Alcohol, cocaine and THC use reported.  Previous Attempts/Gestures: Yes How many times?:  (multiple ) Other Self Harm Risks: Denies  Triggers for Past Attempts: Unpredictable Intentional Self Injurious Behavior: None Family Suicide History: No Recent stressful life event(s): Legal Issues Persecutory voices/beliefs?: No Depression: Yes Depression Symptoms: Despondent, Tearfulness, Insomnia, Isolating, Guilt, Fatigue, Loss of interest in usual pleasures, Feeling worthless/self pity, Feeling angry/irritable Substance abuse history and/or treatment for substance abuse?: Yes Suicide prevention information given to non-admitted patients: Not applicable  Risk to Others within the past 6 months Homicidal Ideation: No Does patient have any lifetime risk of violence toward others beyond the six months prior to admission? : No Thoughts of Harm to Others: No Current Homicidal Intent: No Current Homicidal Plan: No Access to Homicidal Means: No Identified Victim: N/A History of harm to others?: No Assessment of Violence: None Noted Violent Behavior Description: No violent behaviors observed. Pt is calm and cooperative at this time.  Does patient have access to weapons?: No Criminal Charges Pending?: No Does patient have a court date: No Is patient on probation?: Yes  Psychosis Hallucinations: Auditory Delusions: None noted  Mental Status Report Appearance/Hygiene: In scrubs Eye Contact: Fair Motor Activity: Freedom of movement Speech: Logical/coherent Level of Consciousness: Quiet/awake Mood: Depressed Affect: Blunted Anxiety Level: Minimal Thought Processes: Coherent,  Relevant Judgement: Partial Orientation: Person, Place, Time, Situation, Appropriate for developmental age Obsessive Compulsive Thoughts/Behaviors: None  Cognitive Functioning Concentration: Fair Memory: Recent Intact, Remote Intact IQ: Average Insight: Fair Impulse Control: Fair Appetite: Fair Weight Loss: 0 Weight Gain: 0 Sleep: Decreased Total Hours of Sleep: 4 Vegetative Symptoms: Staying in bed  ADLScreening [facility] Assessment Services) Patient's cognitive ability adequate to safely complete daily activities?: Yes Patient able to express need for assistance with ADLs?: Yes Independently performs ADLs?: Yes (appropriate for developmental age)  Prior Inpatient Therapy Prior Inpatient Therapy: Yes Prior Therapy Dates: 1/14, 10/14, 12/12, 9/13, 3/14, 7/15, 1/16, 9/16, 5/17 [facility]  Reason for Treatment: Depression, substance abuse treatment  Prior Outpatient Therapy Prior Outpatient Therapy: Yes Prior Therapy Dates: Current  Prior Therapy Facilty/Provider(s): [facility]  Reason for Treatment: Medication management  Does patient have an ACCT team?: No Does patient have Intensive In-House Services?  : No Does patient have [facility] services? : No Does patient have P4CC services?: No  ADL Screening (condition at time of admission) Patient's cognitive ability adequate to safely complete daily activities?: Yes Patient able to express need for assistance with ADLs?: Yes Independently performs ADLs?: Yes (appropriate for developmental age)       Abuse/Neglect Assessment (Assessment to be complete while patient is alone) Physical Abuse: Denies Verbal Abuse: Denies  Sexual Abuse: Denies Exploitation of patient/patient's resources: Denies Self-Neglect: Denies     Merchant navy officer (For Healthcare) Does patient have an advance directive?: No Would patient like information on creating an advanced directive?: No - patient declined  information Nutrition Screen- [facility] Adult/[facility] Patient's home diet:  (pt. given sandwich x 2 and soda.)  Additional Information 1:1 In Past 12 Months?: No CIRT Risk: No Elopement Risk: No Does patient have medical clearance?: Yes     Disposition:  Disposition Initial Assessment Completed for this Encounter: Yes Disposition of Patient: Other dispositions Other disposition(s): Other (Comment) (AM Psych eval )  Lennox Leikam S 09/03/2016 11:14 PM

## 2016-09-03 NOTE — ED Notes (Signed)
Bed: WTR7 Expected date:  Expected time:  Means of arrival:  Comments: Lab 

## 2016-09-03 NOTE — ED Notes (Signed)
Bed: WTR5 Expected date:  Expected time:  Means of arrival:  Comments: 

## 2016-09-03 NOTE — ED Notes (Signed)
Pt. In burgundy scrubs. Pt. And belongings wanded by security. Pt. Has 1 black purse, 1 black bag, 2 belongings bags. Pt. Has 1 pr. Black shoes(w/heels), 1 black tablet, 1 blue hat, 1 blue jean pant, 1 beige shirt, 1 beige flower jacket, 1 pink bra. Pt. Belongings locked up at the nurses station in triage.

## 2016-09-03 NOTE — ED Triage Notes (Signed)
Patient complaining of depression and having thoughts of wanting to kill herself. Patient states that she does not have a plan. Patient stated that she has been trying really hard to get off of drugs and she went to see her probation officer and she said she that she tested positive. She stated that it set her back and made her really depressed. Patient states she has been depressed since Thursday.

## 2016-09-03 NOTE — ED Provider Notes (Signed)
[facility]-EMERGENCY DEPT Provider Note   CSN: [id number] Arrival date & time: 09/03/16  2046     History   Chief Complaint Chief Complaint  Patient presents with  . Suicidal    HPI [name] is a 55 y.o. female.  She is here for evaluation of suicidal ideation, which she states is caused by resumption of cocaine use. She is also feeling "depressed" because she relapsed. She denies fever, chills, nausea, vomiting, cough, chest pain, weakness or dizziness. She has some legal trouble because she tested positive for cocaine. She is currently on probation. There are no other known modifying factors.  HPI  Past Medical History:  Diagnosis Date  . Bipolar 1 disorder (HCC)   . Depression   . Drug abuse   . Schizophrenia [facility])     Patient Active Problem List   Diagnosis Date Noted  . Schizoaffective disorder, bipolar type (HCC) 05/18/2016  . Annual physical exam 02/15/2016  . Encounter for routine gynecological examination 02/15/2016  . Breast cancer screening 02/15/2016  . Affective psychosis, bipolar (HCC)   . Cannabis use disorder, moderate, dependence (HCC) 09/22/2015  . Cocaine use disorder, moderate, dependence (HCC) 09/22/2015  . UTI (lower urinary tract infection) 09/04/2012    Past Surgical History:  Procedure Laterality Date  . NO PAST SURGERIES      OB History    No data available       Home Medications    Prior to Admission medications   Medication Sig Start Date End Date Taking? Authorizing Provider  acetaminophen (TYLENOL) 500 MG tablet Take 1,000 mg by mouth every 6 (six) hours as needed for mild pain, moderate pain or headache.    Yes Historical Provider, MD  lamoTRIgine (LAMICTAL) 25 MG tablet Take 2 tablets (50 mg total) by mouth every evening. 05/21/16  Yes Oneta Rack, NP  Multiple Vitamin (MULTIVITAMIN WITH MINERALS) TABS tablet Take 1 tablet by mouth daily.   Yes Historical Provider, MD  QUEtiapine (SEROQUEL) 400 MG tablet Take 1 tablet  (400 mg total) by mouth at bedtime. 05/21/16  Yes Oneta Rack, NP  QUEtiapine (SEROQUEL) 50 MG tablet Take 50 mg by mouth every morning.   Yes Historical Provider, MD    Family History Family History  Problem Relation Age of Onset  . Mental illness Maternal Aunt   . Cancer Maternal Aunt     breast   . Mental illness Paternal Aunt   . Anesthesia problems Neg Hx   . Hypotension Neg Hx   . Malignant hyperthermia Neg Hx   . Pseudochol deficiency Neg Hx     Social History Social History  Substance Use Topics  . Smoking status: Current Every Day Smoker    Packs/day: 0.25    Years: 10.00    Types: Cigarettes, Cigars  . Smokeless tobacco: Never Used  . Alcohol use 1.8 oz/week    3 Cans of beer per week     Comment: once a week a 40 oz.      Allergies   Review of patient's allergies indicates no known allergies.   Review of Systems Review of Systems  All other systems reviewed and are negative.    Physical Exam Updated Vital Signs BP 126/84 (BP Location: Right Arm)   Pulse 82   Temp 97.9 F (36.6 C) (Oral)   Resp 16   Ht 5' (1.524 m)   Wt 125 lb (56.7 kg)   LMP  (Exact Date)   SpO2 100%   BMI  24.41 kg/m   Physical Exam  Constitutional: She is oriented to person, place, and time. She appears well-developed and well-nourished.  HENT:  Head: Normocephalic and atraumatic.  Eyes: Conjunctivae and EOM are normal. Pupils are equal, round, and reactive to light.  Neck: Normal range of motion and phonation normal. Neck supple.  Cardiovascular: Normal rate.   Pulmonary/Chest: Effort normal.  Musculoskeletal: Normal range of motion.  Neurological: She is alert and oriented to person, place, and time. She exhibits normal muscle tone.  Skin: Skin is warm and dry.  Psychiatric: She has a normal mood and affect. Her behavior is normal. Judgment and thought content normal.  Nursing note and vitals reviewed.    ED Treatments / Results  Labs (all labs ordered are  listed, but only abnormal results are displayed) Labs Reviewed  COMPREHENSIVE METABOLIC PANEL - Abnormal; Notable for the following:       Result Value   Glucose, Bld 111 (*)    BUN 28 (*)    Creatinine, Ser 1.03 (*)    All other components within normal limits  ACETAMINOPHEN LEVEL - Abnormal; Notable for the following:    Acetaminophen (Tylenol), Serum <10 (*)    All other components within normal limits  ETHANOL  CBC  URINE RAPID DRUG SCREEN, HOSP PERFORMED  SALICYLATE LEVEL    EKG  EKG Interpretation None       Radiology No results found.  Procedures Procedures (including critical care time)  Medications Ordered in ED Medications  acetaminophen (TYLENOL) tablet 650 mg (not administered)  ibuprofen (ADVIL,MOTRIN) tablet 600 mg (not administered)  zolpidem (AMBIEN) tablet 5 mg (not administered)  nicotine (NICODERM CQ - dosed in mg/24 hours) patch 21 mg (not administered)  ondansetron (ZOFRAN) tablet 4 mg (not administered)  alum & mag hydroxide-simeth (MAALOX/MYLANTA) 200-200-20 MG/5ML suspension 30 mL (not administered)     Initial Impression / Assessment and Plan / ED Course  I have reviewed the triage vital signs and the nursing notes.  Pertinent labs & imaging results that were available during my care of the patient were reviewed by me and considered in my medical decision making (see chart for details).  Clinical Course  Comment By Time  At this time, she is medically cleared for treatment by psychiatry Mancel Bale, MD 09/10 2215    Medications  acetaminophen (TYLENOL) tablet 650 mg (not administered)  ibuprofen (ADVIL,MOTRIN) tablet 600 mg (not administered)  zolpidem (AMBIEN) tablet 5 mg (not administered)  nicotine (NICODERM CQ - dosed in mg/24 hours) patch 21 mg (not administered)  ondansetron (ZOFRAN) tablet 4 mg (not administered)  alum & mag hydroxide-simeth (MAALOX/MYLANTA) 200-200-20 MG/5ML suspension 30 mL (not administered)    Patient  Vitals for the past 24 hrs:  BP Temp Temp src Pulse Resp SpO2 Height Weight  09/03/16 2134 - - - - - - 5' (1.524 m) 125 lb (56.7 kg)  09/03/16 2059 126/84 97.9 F (36.6 C) Oral 82 16 100 % - -    TTS consult     Final Clinical Impressions(s) / ED Diagnoses   Final diagnoses:  Depression  Suicidal ideation    Depression with suicidal ideation. She will require evaluation by psychiatry, prior to disposition.   Nursing Notes Reviewed/ Care Coordinated, and agree without changes. Applicable Imaging Reviewed.  Interpretation of Laboratory Data incorporated into ED treatment  Plan- as per TTS in conjunction with oncoming provider team  New Prescriptions New Prescriptions   No medications on file     [facility]  Effie ShyWentz, MD 09/03/16 2345

## 2016-09-03 NOTE — BH Assessment (Signed)
Assessment completed. Consulted Dr. Elsie SaasJonnalagadda who recommended that pt be evaluated by the psychiatrist in the morning. Informed Dr. Effie ShyWentz of the recommendation.

## 2016-09-04 DIAGNOSIS — R45851 Suicidal ideations: Secondary | ICD-10-CM | POA: Diagnosis not present

## 2016-09-04 DIAGNOSIS — F250 Schizoaffective disorder, bipolar type: Secondary | ICD-10-CM | POA: Diagnosis not present

## 2016-09-04 MED ORDER — QUETIAPINE FUMARATE 300 MG PO TABS
400.0000 mg | ORAL_TABLET | Freq: Every day | ORAL | Status: DC
  Administered 2016-09-04: 400 mg via ORAL
  Filled 2016-09-04: qty 1

## 2016-09-04 MED ORDER — QUETIAPINE FUMARATE 50 MG PO TABS
50.0000 mg | ORAL_TABLET | Freq: Every day | ORAL | Status: DC
  Administered 2016-09-04: 50 mg via ORAL
  Filled 2016-09-04 (×2): qty 1

## 2016-09-04 MED ORDER — LAMOTRIGINE 25 MG PO TABS
50.0000 mg | ORAL_TABLET | Freq: Every evening | ORAL | Status: DC
  Filled 2016-09-04: qty 2

## 2016-09-04 NOTE — ED Notes (Signed)
Pt states that she only takes seroquel and refuses to take her lamictal.

## 2016-09-04 NOTE — Consult Note (Signed)
[name]   Reason for [name]:  Depression with suicidal ideations Referring Physician:  EDP Patient Identification: [name] MRN:  [id number] Principal Diagnosis: Schizoaffective disorder, bipolar type [facility]) Diagnosis:   Patient Active Problem List   Diagnosis Date Noted  . Schizoaffective disorder, bipolar type ([place]) [F25.0] 05/18/2016    Priority: High  . Cocaine use disorder, moderate, dependence ([place]) [F14.20] 09/22/2015    Priority: High  . Annual physical exam [Z00.00] 02/15/2016  . Encounter for routine gynecological examination [Z01.419] 02/15/2016  . Breast cancer screening [Z12.39] 02/15/2016  . Affective psychosis, bipolar ([place]) [F31.9]   . Cannabis use disorder, moderate, dependence ([place]) [F12.20] 09/22/2015  . UTI (lower urinary tract infection) [N39.0] 09/04/2012    Total Time spent with patient: 45 minutes  Subjective:   [name] is a 55 y.o. female patient admitted [place].  HPI:  55 yo female who went to see her parole officer and was positive for cocaine.  She got upset and became suicidal.  Beverly Sessions supposedly wanted to commit her on Thursday but she had things to do first.  Last night, she came to the ED.  No hallucinations, homicidal ideations, and withdrawal symptoms.  Past Psychiatric History: schizoaffective disorder, substance abuse  Risk to Self: Suicidal Ideation: Yes-Currently Present Suicidal Intent: No Is patient at risk for suicide?: Yes Suicidal Plan?: No Access to Means: No What has been your use of drugs/alcohol within the last 12 months?: Alcohol, cocaine and THC use reported.  How many times?:  (multiple ) Other Self Harm Risks: Denies  Triggers for Past Attempts: Unpredictable Intentional Self Injurious Behavior: None Risk to Others: Homicidal Ideation: No Thoughts of Harm to Others: No Current Homicidal Intent: No Current Homicidal Plan: No Access to Homicidal Means: No Identified Victim:  N/A History of harm to others?: No Assessment of Violence: None Noted Violent Behavior Description: No violent behaviors observed. Pt is calm and cooperative at this time.  Does patient have access to weapons?: No Criminal Charges Pending?: No Does patient have a court date: No Prior Inpatient Therapy: Prior Inpatient Therapy: Yes Prior Therapy Dates: 1/14, 10/14, 12/12, 9/13, 3/14, 7/15, 1/16, 9/16, 5/17 Prior Therapy Facilty/Provider(s): [facility]  Reason for Treatment: Depression, substance abuse treatment Prior Outpatient Therapy: Prior Outpatient Therapy: Yes Prior Therapy Dates: Current  Prior Therapy Facilty/Provider(s): [facility]  Reason for Treatment: Medication management  Does patient have an ACCT team?: No Does patient have Intensive In-House Services?  : No Does patient have [facility] services? : No Does patient have P4CC services?: No  Past Medical History:  Past Medical History:  Diagnosis Date  . Bipolar 1 disorder ([place])   . Depression   . Drug abuse   . Schizophrenia [facility])     Past Surgical History:  Procedure Laterality Date  . NO PAST SURGERIES     Family History:  Family History  Problem Relation Age of Onset  . Mental illness Maternal Aunt   . Cancer Maternal Aunt     breast   . Mental illness Paternal Aunt   . Anesthesia problems Neg Hx   . Hypotension Neg Hx   . Malignant hyperthermia Neg Hx   . Pseudochol deficiency Neg Hx    Family Psychiatric  History: none Social History:  History  Alcohol Use  . 1.8 oz/week  . 3 Cans of beer per week    Comment: once a week a 40 oz.      History  Drug Use  . Types: Marijuana,  Cocaine    Comment: occasionally    Social History   Social History  . Marital status: Single    Spouse name: N/A  . Number of children: N/A  . Years of education: N/A   Social History Main Topics  . Smoking status: Current Every Day Smoker    Packs/day: 0.25    Years: 10.00    Types: Cigarettes, Cigars  . Smokeless  tobacco: Never Used  . Alcohol use 1.8 oz/week    3 Cans of beer per week     Comment: once a week a 40 oz.   . Drug use:     Types: Marijuana, Cocaine     Comment: occasionally  . Sexual activity: Yes    Birth control/ protection: Condom   Other Topics Concern  . None   Social History Narrative  . None   Additional Social History:    Allergies:  No Known Allergies  Labs:  Results for orders placed or performed during the hospital encounter of 09/03/16 (from the past 48 hour(s))  Rapid urine drug screen (hospital performed)     Status: None   Collection Time: 09/03/16  9:14 PM  Result Value Ref Range   Opiates NONE DETECTED NONE DETECTED   Cocaine NONE DETECTED NONE DETECTED   Benzodiazepines NONE DETECTED NONE DETECTED   Amphetamines NONE DETECTED NONE DETECTED   Tetrahydrocannabinol NONE DETECTED NONE DETECTED   Barbiturates NONE DETECTED NONE DETECTED    Comment:        DRUG SCREEN FOR MEDICAL PURPOSES ONLY.  IF CONFIRMATION IS NEEDED FOR ANY PURPOSE, NOTIFY LAB WITHIN 5 DAYS.        LOWEST DETECTABLE LIMITS FOR URINE DRUG SCREEN Drug Class       Cutoff (ng/mL) Amphetamine      1000 Barbiturate      200 Benzodiazepine   035 Tricyclics       465 Opiates          300 Cocaine          300 THC              50   Comprehensive metabolic panel     Status: Abnormal   Collection Time: 09/03/16  9:21 PM  Result Value Ref Range   Sodium 139 135 - 145 mmol/L   Potassium 4.3 3.5 - 5.1 mmol/L   Chloride 106 101 - 111 mmol/L   CO2 25 22 - 32 mmol/L   Glucose, Bld 111 (H) 65 - 99 mg/dL   BUN 28 (H) 6 - 20 mg/dL   Creatinine, Ser 1.03 (H) 0.44 - 1.00 mg/dL   Calcium 9.5 8.9 - 10.3 mg/dL   Total Protein 7.3 6.5 - 8.1 g/dL   Albumin 4.3 3.5 - 5.0 g/dL   AST 29 15 - 41 U/L   ALT 22 14 - 54 U/L   Alkaline Phosphatase 66 38 - 126 U/L   Total Bilirubin 0.4 0.3 - 1.2 mg/dL   GFR calc non Af Amer >60 >60 mL/min   GFR calc Af Amer >60 >60 mL/min    Comment: (NOTE) The  eGFR has been calculated using the CKD EPI equation. This calculation has not been validated in all clinical situations. eGFR's persistently <60 mL/min signify possible Chronic Kidney Disease.    Anion gap 8 5 - 15  Ethanol     Status: None   Collection Time: 09/03/16  9:21 PM  Result Value Ref Range   Alcohol, Ethyl (B) <5 <5  mg/dL    Comment:        LOWEST DETECTABLE LIMIT FOR SERUM ALCOHOL IS 5 mg/dL FOR MEDICAL PURPOSES ONLY   cbc     Status: None   Collection Time: 09/03/16  9:21 PM  Result Value Ref Range   WBC 6.1 4.0 - 10.5 K/uL   RBC 4.73 3.87 - 5.11 MIL/uL   Hemoglobin 13.8 12.0 - 15.0 g/dL   HCT 41.1 36.0 - 46.0 %   MCV 86.9 78.0 - 100.0 fL   MCH 29.2 26.0 - 34.0 pg   MCHC 33.6 30.0 - 36.0 g/dL   RDW 15.1 11.5 - 15.5 %   Platelets 246 440 - 102 K/uL  Salicylate level     Status: None   Collection Time: 09/03/16  9:21 PM  Result Value Ref Range   Salicylate Lvl <7.2 2.8 - 30.0 mg/dL  Acetaminophen level     Status: Abnormal   Collection Time: 09/03/16  9:21 PM  Result Value Ref Range   Acetaminophen (Tylenol), Serum <10 (L) 10 - 30 ug/mL    Comment:        THERAPEUTIC CONCENTRATIONS VARY SIGNIFICANTLY. A RANGE OF 10-30 ug/mL MAY BE AN EFFECTIVE CONCENTRATION FOR MANY PATIENTS. HOWEVER, SOME ARE BEST TREATED AT CONCENTRATIONS OUTSIDE THIS RANGE. ACETAMINOPHEN CONCENTRATIONS >150 ug/mL AT 4 HOURS AFTER INGESTION AND >50 ug/mL AT 12 HOURS AFTER INGESTION ARE OFTEN ASSOCIATED WITH TOXIC REACTIONS.     Current Facility-Administered Medications  Medication Dose Route Frequency Provider Last Rate Last Dose  . acetaminophen (TYLENOL) tablet 650 mg  650 mg Oral Q4H PRN Daleen Bo, MD   650 mg at 09/04/16 0646  . alum & mag hydroxide-simeth (MAALOX/MYLANTA) 200-200-20 MG/5ML suspension 30 mL  30 mL Oral PRN Daleen Bo, MD      . ibuprofen (ADVIL,MOTRIN) tablet 600 mg  600 mg Oral Q8H PRN Daleen Bo, MD      . nicotine (NICODERM CQ - dosed in mg/24  hours) patch 21 mg  21 mg Transdermal Daily PRN Daleen Bo, MD      . ondansetron [facility]) tablet 4 mg  4 mg Oral Q8H PRN Daleen Bo, MD      . zolpidem (AMBIEN) tablet 5 mg  5 mg Oral QHS PRN Daleen Bo, MD   5 mg at 09/04/16 0138   Current Outpatient Prescriptions  Medication Sig Dispense Refill  . acetaminophen (TYLENOL) 500 MG tablet Take 1,000 mg by mouth every 6 (six) hours as needed for mild pain, moderate pain or headache.     . lamoTRIgine (LAMICTAL) 25 MG tablet Take 2 tablets (50 mg total) by mouth every evening. 30 tablet 0  . Multiple Vitamin (MULTIVITAMIN WITH MINERALS) TABS tablet Take 1 tablet by mouth daily.    . QUEtiapine (SEROQUEL) 400 MG tablet Take 1 tablet (400 mg total) by mouth at bedtime. 30 tablet 0  . QUEtiapine (SEROQUEL) 50 MG tablet Take 50 mg by mouth every morning.      Musculoskeletal: Strength & Muscle Tone: within normal limits Gait & Station: normal Patient leans: N/A  Psychiatric Specialty Exam: Physical Exam  Constitutional: She is oriented to person, place, and time. She appears well-developed and well-nourished.  HENT:  Head: Normocephalic.  Neck: Normal range of motion.  Respiratory: Effort normal.  Musculoskeletal: Normal range of motion.  Neurological: She is alert and oriented to person, place, and time.  Skin: Skin is warm and dry.  Psychiatric: Her speech is normal and behavior is normal. Judgment normal.  Cognition and memory are normal. She exhibits a depressed mood. She expresses suicidal ideation.    Review of Systems  Constitutional: Negative.   HENT: Negative.   Eyes: Negative.   Respiratory: Negative.   Cardiovascular: Negative.   Gastrointestinal: Negative.   Genitourinary: Negative.   Musculoskeletal: Negative.   Skin: Negative.   Neurological: Negative.   Endo/Heme/Allergies: Negative.   Psychiatric/Behavioral: Positive for depression and substance abuse.    Blood pressure 125/66, pulse 78, temperature 98.1  F (36.7 C), temperature source Oral, resp. rate 18, height 5' (1.524 m), weight 56.7 kg (125 lb), SpO2 100 %.Body mass index is 24.41 kg/m.  General Appearance: Casual  Eye Contact:  Fair  Speech:  Normal Rate  Volume:  Normal  Mood:  Depressed  Affect:  Congruent  Thought Process:  Coherent and Descriptions of Associations: Intact  Orientation:  Full (Time, Place, and Person)  Thought Content:  Rumination  Suicidal Thoughts:  Yes.  without intent/plan  Homicidal Thoughts:  No  Memory:  Immediate;   Fair Recent;   Fair Remote;   Fair  Judgement:  Fair  Insight:  Fair  Psychomotor Activity:  Decreased  Concentration:  Concentration: Fair and Attention Span: Fair  Recall:  [employer] of Knowledge:  Fair  Language:  Good  Akathisia:  No  Handed:  Right  AIMS (if indicated):     Assets:  Leisure Time Physical Health Resilience  ADL's:  Intact  Cognition:  WNL  Sleep:        Treatment Plan Summary: Daily contact with patient to assess and evaluate symptoms and progress in treatment, Medication management and Plan schizoaffective disorder, bipolar type:  -Crisis stabilization -Medication management:  Restarted Lamictal 50 mg daily for mood stabilization and Seroquel 400 mg at bedtime and 50 mg in the am for mood stabilization and sleep. -Individual and substance abuse counseling  Disposition: Supportive therapy provided about ongoing stressors.  Waylan Boga, NP 09/04/2016 1:14 PM  Patient seen face-to-face for psychiatric evaluation, chart reviewed and case discussed with the physician extender and developed treatment plan. Reviewed the information documented and agree with the treatment plan. Corena Pilgrim, MD

## 2016-09-04 NOTE — ED Notes (Signed)
Pt sleeping since beginning of shift. Respiration regular and unlabored. Bedtime medication administered as prescribed.

## 2016-09-04 NOTE — Progress Notes (Signed)
09/04/16  1406:  LRT went to pt room to introduce self and offer activities.  Pt expressed she was not interested in activities at this time.   Caroll RancherMarjette Jermarion Poffenberger, LRT/CTRS

## 2016-09-04 NOTE — Progress Notes (Signed)
Pt confirms with  ED Cm that [facility]  EPIC Updated

## 2016-09-04 NOTE — ED Notes (Signed)
Pain charting done by error.  Incorrect patient

## 2016-09-04 NOTE — ED Notes (Signed)
Pt crying in room on approach. Pt reports feeling hungry and has not eaten anything since 7 pm. Pt offered a sandwich refused stating "I do not want a cold sandwich". Pt accepted saltine cracker and cheese. Medication administered as prescribed. Pt resting in bed.

## 2016-09-04 NOTE — BH Assessment (Signed)
[facility]  09/04/06: Patient will be recommended to Observation Unit this date.

## 2016-09-05 ENCOUNTER — Ambulatory Visit (HOSPITAL_COMMUNITY): Admission: RE | Admit: 2016-09-05 | Payer: Medicare Other | Source: Home / Self Care | Admitting: Psychiatry

## 2016-09-05 NOTE — ED Notes (Signed)
Discharge instructions were reviewed with patient.  Substance abuse resources were given and reviewed.  Belongings were returned and bus pass was given.  Pt is in no distress.

## 2016-09-05 NOTE — Consult Note (Signed)
[facility] Consult   Reason for Consult:  Depression with suicidal ideations Referring Physician:  EDP Patient Identification: [name] MRN:  [id number] Principal Diagnosis: Schizoaffective disorder, bipolar type [facility]) Diagnosis:   Patient Active Problem List   Diagnosis Date Noted  . Schizoaffective disorder, bipolar type ([place]) [F25.0] 05/18/2016    Priority: High  . Cocaine use disorder, moderate, dependence ([place]) [F14.20] 09/22/2015    Priority: High  . Annual physical exam [Z00.00] 02/15/2016  . Encounter for routine gynecological examination [Z01.419] 02/15/2016  . Breast cancer screening [Z12.39] 02/15/2016  . Affective psychosis, bipolar ([place]) [F31.9]   . Cannabis use disorder, moderate, dependence ([place]) [F12.20] 09/22/2015  . UTI (lower urinary tract infection) [N39.0] 09/04/2012    Total Time spent with patient: 30 minutes  Subjective:   [name] is a 55 y.o. female patient admitted [place].  HPI:  55 yo female who went to see her parole officer and was positive for cocaine.  She got upset and became suicidal.  Beverly Sessions supposedly wanted to commit her on Thursday but she had things to do first.  Last night, she came to the ED.  No hallucinations, homicidal ideations, and withdrawal symptoms.  Today, she denies suicidal/homicidal ideations, hallucinations, and withdrawal symptoms.  Resources for substance abuse provided and encouraged to follow-up with her outpatient provider, [facility].  Past Psychiatric History: schizoaffective disorder, substance abuse  Risk to Self: Suicidal Ideation: Yes-Currently Present Suicidal Intent: No Is patient at risk for suicide?: Yes Suicidal Plan?: No Access to Means: No What has been your use of drugs/alcohol within the last 12 months?: Alcohol, cocaine and THC use reported.  How many times?:  (multiple ) Other Self Harm Risks: Denies  Triggers for Past Attempts: Unpredictable Intentional Self  Injurious Behavior: None Risk to Others: Homicidal Ideation: No Thoughts of Harm to Others: No Current Homicidal Intent: No Current Homicidal Plan: No Access to Homicidal Means: No Identified Victim: N/A History of harm to others?: No Assessment of Violence: None Noted Violent Behavior Description: No violent behaviors observed. Pt is calm and cooperative at this time.  Does patient have access to weapons?: No Criminal Charges Pending?: No Does patient have a court date: No Prior Inpatient Therapy: Prior Inpatient Therapy: Yes Prior Therapy Dates: 1/14, 10/14, 12/12, 9/13, 3/14, 7/15, 1/16, 9/16, 5/17 Prior Therapy Facilty/Provider(s): [facility]  Reason for Treatment: Depression, substance abuse treatment Prior Outpatient Therapy: Prior Outpatient Therapy: Yes Prior Therapy Dates: Current  Prior Therapy Facilty/Provider(s): [facility]  Reason for Treatment: Medication management  Does patient have an ACCT team?: No Does patient have Intensive In-House Services?  : No Does patient have [facility] services? : No Does patient have P4CC services?: No  Past Medical History:  Past Medical History:  Diagnosis Date  . Bipolar 1 disorder ([place])   . Depression   . Drug abuse   . Schizophrenia [facility])     Past Surgical History:  Procedure Laterality Date  . NO PAST SURGERIES     Family History:  Family History  Problem Relation Age of Onset  . Mental illness Maternal Aunt   . Cancer Maternal Aunt     breast   . Mental illness Paternal Aunt   . Anesthesia problems Neg Hx   . Hypotension Neg Hx   . Malignant hyperthermia Neg Hx   . Pseudochol deficiency Neg Hx    Family Psychiatric  History: none Social History:  History  Alcohol Use  . 1.8 oz/week  . 3 Cans of beer  per week    Comment: once a week a 40 oz.      History  Drug Use  . Types: Marijuana, Cocaine    Comment: occasionally    Social History   Social History  . Marital status: Single    Spouse name: N/A  . Number  of children: N/A  . Years of education: N/A   Social History Main Topics  . Smoking status: Current Every Day Smoker    Packs/day: 0.25    Years: 10.00    Types: Cigarettes, Cigars  . Smokeless tobacco: Never Used  . Alcohol use 1.8 oz/week    3 Cans of beer per week     Comment: once a week a 40 oz.   . Drug use:     Types: Marijuana, Cocaine     Comment: occasionally  . Sexual activity: Yes    Birth control/ protection: Condom   Other Topics Concern  . None   Social History Narrative  . None   Additional Social History:    Allergies:  No Known Allergies  Labs:  Results for orders placed or performed during the hospital encounter of 09/03/16 (from the past 48 hour(s))  Rapid urine drug screen (hospital performed)     Status: None   Collection Time: 09/03/16  9:14 PM  Result Value Ref Range   Opiates NONE DETECTED NONE DETECTED   Cocaine NONE DETECTED NONE DETECTED   Benzodiazepines NONE DETECTED NONE DETECTED   Amphetamines NONE DETECTED NONE DETECTED   Tetrahydrocannabinol NONE DETECTED NONE DETECTED   Barbiturates NONE DETECTED NONE DETECTED    Comment:        DRUG SCREEN FOR MEDICAL PURPOSES ONLY.  IF CONFIRMATION IS NEEDED FOR ANY PURPOSE, NOTIFY LAB WITHIN 5 DAYS.        LOWEST DETECTABLE LIMITS FOR URINE DRUG SCREEN Drug Class       Cutoff (ng/mL) Amphetamine      1000 Barbiturate      200 Benzodiazepine   465 Tricyclics       681 Opiates          300 Cocaine          300 THC              50   Comprehensive metabolic panel     Status: Abnormal   Collection Time: 09/03/16  9:21 PM  Result Value Ref Range   Sodium 139 135 - 145 mmol/L   Potassium 4.3 3.5 - 5.1 mmol/L   Chloride 106 101 - 111 mmol/L   CO2 25 22 - 32 mmol/L   Glucose, Bld 111 (H) 65 - 99 mg/dL   BUN 28 (H) 6 - 20 mg/dL   Creatinine, Ser 1.03 (H) 0.44 - 1.00 mg/dL   Calcium 9.5 8.9 - 10.3 mg/dL   Total Protein 7.3 6.5 - 8.1 g/dL   Albumin 4.3 3.5 - 5.0 g/dL   AST 29 15 - 41 U/L    ALT 22 14 - 54 U/L   Alkaline Phosphatase 66 38 - 126 U/L   Total Bilirubin 0.4 0.3 - 1.2 mg/dL   GFR calc non Af Amer >60 >60 mL/min   GFR calc Af Amer >60 >60 mL/min    Comment: (NOTE) The eGFR has been calculated using the CKD EPI equation. This calculation has not been validated in all clinical situations. eGFR's persistently <60 mL/min signify possible Chronic Kidney Disease.    Anion gap 8 5 - 15  Ethanol  Status: None   Collection Time: 09/03/16  9:21 PM  Result Value Ref Range   Alcohol, Ethyl (B) <5 <5 mg/dL    Comment:        LOWEST DETECTABLE LIMIT FOR SERUM ALCOHOL IS 5 mg/dL FOR MEDICAL PURPOSES ONLY   cbc     Status: None   Collection Time: 09/03/16  9:21 PM  Result Value Ref Range   WBC 6.1 4.0 - 10.5 K/uL   RBC 4.73 3.87 - 5.11 MIL/uL   Hemoglobin 13.8 12.0 - 15.0 g/dL   HCT 41.1 36.0 - 46.0 %   MCV 86.9 78.0 - 100.0 fL   MCH 29.2 26.0 - 34.0 pg   MCHC 33.6 30.0 - 36.0 g/dL   RDW 15.1 11.5 - 15.5 %   Platelets 246 696 - 789 K/uL  Salicylate level     Status: None   Collection Time: 09/03/16  9:21 PM  Result Value Ref Range   Salicylate Lvl <3.8 2.8 - 30.0 mg/dL  Acetaminophen level     Status: Abnormal   Collection Time: 09/03/16  9:21 PM  Result Value Ref Range   Acetaminophen (Tylenol), Serum <10 (L) 10 - 30 ug/mL    Comment:        THERAPEUTIC CONCENTRATIONS VARY SIGNIFICANTLY. A RANGE OF 10-30 ug/mL MAY BE AN EFFECTIVE CONCENTRATION FOR MANY PATIENTS. HOWEVER, SOME ARE BEST TREATED AT CONCENTRATIONS OUTSIDE THIS RANGE. ACETAMINOPHEN CONCENTRATIONS >150 ug/mL AT 4 HOURS AFTER INGESTION AND >50 ug/mL AT 12 HOURS AFTER INGESTION ARE OFTEN ASSOCIATED WITH TOXIC REACTIONS.     Current Facility-Administered Medications  Medication Dose Route Frequency Provider Last Rate Last Dose  . acetaminophen (TYLENOL) tablet 650 mg  650 mg Oral Q4H PRN Daleen Bo, MD   650 mg at 09/04/16 0646  . alum & mag hydroxide-simeth (MAALOX/MYLANTA)  200-200-20 MG/5ML suspension 30 mL  30 mL Oral PRN Daleen Bo, MD      . ibuprofen (ADVIL,MOTRIN) tablet 600 mg  600 mg Oral Q8H PRN Daleen Bo, MD      . lamoTRIgine (LAMICTAL) tablet 50 mg  50 mg Oral QPM Patrecia Pour, NP      . nicotine (NICODERM CQ - dosed in mg/24 hours) patch 21 mg  21 mg Transdermal Daily PRN Daleen Bo, MD      . ondansetron [facility]) tablet 4 mg  4 mg Oral Q8H PRN Daleen Bo, MD      . QUEtiapine (SEROQUEL) tablet 400 mg  400 mg Oral QHS Patrecia Pour, NP   400 mg at 09/04/16 2200  . QUEtiapine (SEROQUEL) tablet 50 mg  50 mg Oral Daily Patrecia Pour, NP   50 mg at 09/04/16 1357   Current Outpatient Prescriptions  Medication Sig Dispense Refill  . acetaminophen (TYLENOL) 500 MG tablet Take 1,000 mg by mouth every 6 (six) hours as needed for mild pain, moderate pain or headache.     . lamoTRIgine (LAMICTAL) 25 MG tablet Take 2 tablets (50 mg total) by mouth every evening. 30 tablet 0  . Multiple Vitamin (MULTIVITAMIN WITH MINERALS) TABS tablet Take 1 tablet by mouth daily.    . QUEtiapine (SEROQUEL) 400 MG tablet Take 1 tablet (400 mg total) by mouth at bedtime. 30 tablet 0  . QUEtiapine (SEROQUEL) 50 MG tablet Take 50 mg by mouth every morning.      Musculoskeletal: Strength & Muscle Tone: within normal limits Gait & Station: normal Patient leans: N/A  Psychiatric Specialty Exam: Physical Exam  Constitutional:  She is oriented to person, place, and time. She appears well-developed and well-nourished.  HENT:  Head: Normocephalic.  Neck: Normal range of motion.  Respiratory: Effort normal.  Musculoskeletal: Normal range of motion.  Neurological: She is alert and oriented to person, place, and time.  Skin: Skin is warm and dry.  Psychiatric: Her speech is normal and behavior is normal. Judgment and thought content normal. Cognition and memory are normal. She exhibits a depressed mood.    Review of Systems  Constitutional: Negative.   HENT:  Negative.   Eyes: Negative.   Respiratory: Negative.   Cardiovascular: Negative.   Gastrointestinal: Negative.   Genitourinary: Negative.   Musculoskeletal: Negative.   Skin: Negative.   Neurological: Negative.   Endo/Heme/Allergies: Negative.   Psychiatric/Behavioral: Positive for depression.    Blood pressure (!) 107/49, pulse 69, temperature 97.7 F (36.5 C), temperature source Oral, resp. rate 16, height 5' (1.524 m), weight 56.7 kg (125 lb), SpO2 100 %.Body mass index is 24.41 kg/m.  General Appearance: Casual  Eye Contact:  Good  Speech:  Normal Rate  Volume:  Normal  Mood:  Depressed, mild  Affect:  Congruent  Thought Process:  Coherent and Descriptions of Associations: Intact  Orientation:  Full (Time, Place, and Person)  Thought Content:  Clear and coherent  Suicidal Thoughts:  No  Homicidal Thoughts:  No  Memory:  Immediate, good; recent, good; remote, good  Judgement:  Fair  Insight:  Fair  Psychomotor Activity:  Normal  Concentration:   Concentration and attention span:  Good  Recall:  Good  Fund of Knowledge:  Good  Language:  Good  Akathisia:  No  Handed:  Right  AIMS (if indicated):     Assets:  Leisure Time Physical Health Resilience  ADL's:  Intact  Cognition:  WNL  Sleep:        Treatment Plan Summary: Daily contact with patient to assess and evaluate symptoms and progress in treatment, Medication management and Plan schizoaffective disorder, bipolar type:  -Crisis stabilization -Medication management:  Continue Lamictal 50 mg daily for mood stabilization and Seroquel 400 mg at bedtime and 50 mg in the am for mood stabilization and sleep. -Individual and substance abuse counseling -Substance abuse resources  Disposition: discharge home  Waylan Boga, NP 09/05/2016 9:31 AM  Patient seen face-to-face for psychiatric evaluation, chart reviewed and case discussed with the physician extender and developed treatment plan. Reviewed the information  documented and agree with the treatment plan. Corena Pilgrim, MD

## 2016-09-05 NOTE — ED Notes (Signed)
Pt discharged ambulatory.  All belongings were returned to pt.

## 2016-09-05 NOTE — [facility] Suicide Risk Assessment (Signed)
Suicide Risk Assessment  Discharge Assessment   [facility]   Principal Problem: Schizoaffective disorder, bipolar type [facility](HCC) Discharge Diagnoses:  Patient Active Problem List   Diagnosis Date Noted  . Schizoaffective disorder, bipolar type ([facility]) [F25.0] 05/18/2016    Priority: High  . Cocaine use disorder, moderate, dependence ([facility]) [F14.20] 09/22/2015    Priority: High  . Annual physical exam [Z00.00] 02/15/2016  . Encounter for routine gynecological examination [Z01.419] 02/15/2016  . Breast cancer screening [Z12.39] 02/15/2016  . Affective psychosis, bipolar ([facility]) [F31.9]   . Cannabis use disorder, moderate, dependence ([facility]) [F12.20] 09/22/2015  . UTI (lower urinary tract infection) [N39.0] 09/04/2012    Total Time spent with patient: 30 minutes  Musculoskeletal: Strength & Muscle Tone: within normal limits Gait & Station: normal Patient leans: N/A  Psychiatric Specialty Exam: Physical Exam  Constitutional: She is oriented to person, place, and time. She appears well-developed and well-nourished.  HENT:  Head: Normocephalic.  Neck: Normal range of motion.  Respiratory: Effort normal.  Musculoskeletal: Normal range of motion.  Neurological: She is alert and oriented to person, place, and time.  Skin: Skin is warm and dry.  Psychiatric: Her speech is normal and behavior is normal. Judgment and thought content normal. Cognition and memory are normal. She exhibits a depressed mood.    Review of Systems  Constitutional: Negative.   HENT: Negative.   Eyes: Negative.   Respiratory: Negative.   Cardiovascular: Negative.   Gastrointestinal: Negative.   Genitourinary: Negative.   Musculoskeletal: Negative.   Skin: Negative.   Neurological: Negative.   Endo/Heme/Allergies: Negative.   Psychiatric/Behavioral: Positive for depression.    Blood pressure (!) 107/49, pulse 69, temperature 97.7 F (36.5 C), temperature source Oral, resp. rate 16,  height 5' (1.524 m), weight 56.7 kg (125 lb), SpO2 100 %.Body mass index is 24.41 kg/m.  General Appearance: Casual  Eye Contact:  Good  Speech:  Normal Rate  Volume:  Normal  Mood:  Depressed, mild  Affect:  Congruent  Thought Process:  Coherent and Descriptions of Associations: Intact  Orientation:  Full (Time, Place, and Person)  Thought Content:  Clear and coherent  Suicidal Thoughts:  No  Homicidal Thoughts:  No  Memory:  Immediate, good; recent, good; remote, good  Judgement:  Fair  Insight:  Fair  Psychomotor Activity:  Normal  Concentration:   Concentration and attention span:  Good  Recall:  Good  Fund of Knowledge:  Good  Language:  Good  Akathisia:  No  Handed:  Right  AIMS (if indicated):     Assets:  Leisure Time Physical Health Resilience  ADL's:  Intact  Cognition:  WNL  Sleep:      Mental Status Per Nursing Assessment::   On Admission:   suicidal ideations  Demographic Factors:  NA  Loss Factors: Legal issues  Historical Factors: NA  Risk Reduction Factors:   Sense of responsibility to family, Living with another person, especially a relative, Positive social support and Positive therapeutic relationship  Continued Clinical Symptoms:  Depression, mild  Cognitive Features That Contribute To Risk:  None    Suicide Risk:  Minimal: No identifiable suicidal ideation.  Patients presenting with no risk factors but with morbid ruminations; may be classified as minimal risk based on the severity of the depressive symptoms    Plan Of Care/Follow-up recommendations:  Activity:  as tolerated Diet:  heart healthy diet  Onnika Siebel, NP 09/05/2016, 9:36 AM

## 2017-02-14 ENCOUNTER — Ambulatory Visit: Payer: Self-pay | Admitting: Family Medicine

## 2017-03-01 ENCOUNTER — Encounter: Payer: Self-pay | Admitting: Family Medicine

## 2017-03-01 ENCOUNTER — Ambulatory Visit (INDEPENDENT_AMBULATORY_CARE_PROVIDER_SITE_OTHER): Payer: Medicare Other | Admitting: Family Medicine

## 2017-03-01 VITALS — BP 128/60 | HR 80 | Temp 97.8°F | Resp 16 | Ht 60.0 in | Wt 122.0 lb

## 2017-03-01 DIAGNOSIS — B9689 Other specified bacterial agents as the cause of diseases classified elsewhere: Secondary | ICD-10-CM | POA: Insufficient documentation

## 2017-03-01 DIAGNOSIS — Z202 Contact with and (suspected) exposure to infections with a predominantly sexual mode of transmission: Secondary | ICD-10-CM | POA: Diagnosis not present

## 2017-03-01 DIAGNOSIS — N898 Other specified noninflammatory disorders of vagina: Secondary | ICD-10-CM

## 2017-03-01 DIAGNOSIS — N760 Acute vaginitis: Secondary | ICD-10-CM

## 2017-03-01 LAB — POCT WET + KOH PREP
Trich by wet prep: ABSENT
YEAST BY KOH: ABSENT
YEAST BY WET PREP: ABSENT

## 2017-03-01 LAB — POCT URINALYSIS DIP (DEVICE)
BILIRUBIN URINE: NEGATIVE
GLUCOSE, UA: NEGATIVE mg/dL
KETONES UR: NEGATIVE mg/dL
Leukocytes, UA: NEGATIVE
Nitrite: NEGATIVE
Protein, ur: NEGATIVE mg/dL
Urobilinogen, UA: 0.2 mg/dL (ref 0.0–1.0)
pH: 5.5 (ref 5.0–8.0)

## 2017-03-01 MED ORDER — METRONIDAZOLE 500 MG PO TABS: 500.0000 mg | ORAL_TABLET | Freq: Two times a day (BID) | ORAL | 0 refills | Status: DC

## 2017-03-01 NOTE — Progress Notes (Signed)
Subjective:    Patient ID: [name], female    DOB: May 05, 1961, 56 y.o.   MRN: [id number]  Vaginal Itching  The patient's primary symptoms include a genital odor. The patient's pertinent negatives include no genital itching, genital lesions, genital rash, missed menses, pelvic pain, vaginal bleeding or vaginal discharge. This is a recurrent problem. [name] is not pregnant. The symptoms are aggravated by eating. [name] has tried douching and warm baths for the symptoms. [name] uses nothing for contraception. Her past medical history is significant for an STD. There is no history of an abdominal surgery, a Cesarean section, an ectopic pregnancy, endometriosis, a gynecological surgery, herpes simplex, menorrhagia, metrorrhagia, miscarriage, ovarian cysts, perineal abscess, PID, a terminated pregnancy or vaginosis.   Past Medical History:  Diagnosis Date  . Bipolar 1 disorder ([facility])   . Depression   . Drug abuse   . Schizophrenia (HCC)    There is no immunization history on file for this patient.  Social History   Social History  . Marital status: Single    Spouse name: N/A  . Number of children: N/A  . Years of education: N/A   Occupational History  . Not on file.   Social History Main Topics  . Smoking status: Current Every Day Smoker    Packs/day: 0.25    Years: 10.00    Types: Cigarettes, Cigars  . Smokeless tobacco: Never Used  . Alcohol use 1.8 oz/week    3 Cans of beer per week     Comment: once a week a 40 oz.   . Drug use: No     Comment: occasionally  . Sexual activity: Yes    Birth control/ protection: Condom   Other Topics Concern  . Not on file   Social History Narrative  . No narrative on file    Review of Systems  Eyes: Negative.   Respiratory: Negative.   Cardiovascular: Negative.   Gastrointestinal: Negative.   Endocrine: Negative.   Genitourinary: Negative.  Negative for menorrhagia, missed menses, pelvic pain and vaginal discharge.   Musculoskeletal: Negative.   Allergic/Immunologic: Negative.   Neurological: Negative.   Hematological: Negative.        Objective:   Physical Exam  Constitutional: [name] is oriented to person, place, and time. [name] appears well-developed and well-nourished.  HENT:  Head: Normocephalic and atraumatic.  Right Ear: External ear normal.  Left Ear: External ear normal.  Mouth/Throat: Oropharynx is clear and moist.  Neck: Normal range of motion. Neck supple.  Cardiovascular: Normal rate, regular rhythm, normal heart sounds and intact distal pulses.   Pulmonary/Chest: Effort normal.  Abdominal: Soft. Bowel sounds are normal.  Neurological: [name] is alert and oriented to person, place, and time.      BP 128/60 (BP Location: Right Arm, Patient Position: Sitting, Cuff Size: Normal)   Pulse 80   Temp 97.8 F (36.6 C) (Oral)   Resp 16   Ht 5' (1.524 m)   Wt 122 lb (55.3 kg)   SpO2 97%   BMI 23.83 kg/m  Assessment & Plan:  1. Vaginal discharge  - POCT Wet + KOH Prep  2. Vaginal odor  - POCT Wet + KOH Prep  3. Possible exposure to STD Patient counseled on using barrier protection with sexual intercourse.  - HIV antibody (with reflex) - RPR - GC/Chlamydia Probe Amp  4. Bacterial vaginosis Counseled patient on the importance of refraining from drinking alcoholic beverages while taking metronidazole.  - metroNIDAZOLE (FLAGYL) 500 MG tablet;  Take 1 tablet (500 mg total) by mouth 2 (two) times daily.  Dispense: 14 tablet; Refill: 0  Preventative maintenance:  Patient refused vaccinations Previously sent order for mammogram Previously sent referral for colonoscopy   RTC: As needed   Massie MaroonHollis,Metztli Sachdev M, FNP   Massie MaroonHollis,Madeline Bebout M, FNP

## 2017-03-01 NOTE — Patient Instructions (Signed)

## 2017-03-02 LAB — GC/CHLAMYDIA PROBE AMP
CT Probe RNA: NOT DETECTED
GC Probe RNA: NOT DETECTED

## 2017-03-02 LAB — HIV ANTIBODY (ROUTINE TESTING W REFLEX): HIV 1&2 Ab, 4th Generation: NONREACTIVE

## 2017-03-02 LAB — RPR

## 2017-03-05 ENCOUNTER — Ambulatory Visit (HOSPITAL_COMMUNITY)
Admission: RE | Admit: 2017-03-05 | Discharge: 2017-03-05 | Disposition: A | Discharge status: Home / Self Care | Payer: Medicare Other | Attending: Psychiatry | Admitting: Psychiatry

## 2017-03-05 DIAGNOSIS — R440 Auditory hallucinations: Secondary | ICD-10-CM | POA: Insufficient documentation

## 2017-03-05 DIAGNOSIS — G4700 Insomnia, unspecified: Secondary | ICD-10-CM | POA: Diagnosis not present

## 2017-03-05 NOTE — H&P (Signed)
Behavioral Health Medical Screening Exam  [name] is an 56 y.o. female.  Her c/o is sleeplessness and hearing voices.  Non -specific voices.  Reports that she missed her appt as she "lost track of time."  [facility]  "Normally, I come in here, stay for 3 days, get my prescriptions and they discharge me."  Patient is not verbalizing any suicidal ideations or homicidal thoughts.  No paranoia.  Total Time spent with patient: 20 minutes  Psychiatric Specialty Exam: Physical Exam  ROS  There were no vitals taken for this visit.There is no height or weight on file to calculate BMI.  General Appearance: Casual  Eye Contact:  Good  Speech:  Normal Rate  Volume:  Normal  Mood:  Anxious  Affect:  Congruent  Thought Process:  Coherent  Orientation:  Full (Time, Place, and Person)  Thought Content:  Logical  Suicidal Thoughts:  No  Homicidal Thoughts:  No  Memory:  Immediate;   Fair Recent;   Fair Remote;   Fair  Judgement:  Intact  Insight:  Fair  Psychomotor Activity:  Normal  Concentration: Concentration: Fair and Attention Span: Fair  Recall:  [employer]Fair  Fund of Knowledge:Good  Language: Good  Akathisia:  Negative  Handed:  Right  AIMS (if indicated):     Assets:  Desire for Improvement  Sleep:       Musculoskeletal: Strength & Muscle Tone: within normal limits Gait & Station: normal Patient leans: N/A  98.3  69 18 98/51 100% O2 sat room air  Recommendations:  Based on my evaluation the patient does not appear to have an emergency medical condition.  Velna HatchetSheila May Aadan Chenier, NP 03/05/2017, 9:29 AM

## 2017-03-05 NOTE — BH Assessment (Addendum)
Assessment Note  [name] is a 56 y.o. female who presents voluntarily to [facility] as a walk in due to AVH. Pt takes Seroquel, prescribed thru [facility]. Pt indicates that she ran out of her medication and has been off her meds for about a week. Pt unsure how she ran out of her meds, saying "I just lost track of everything". Pt thinks she may have missed an appt. Pt has not contacted [facility]. Pt reports that yesterday, she has started to see "stuff and people in my food" and hear voices talking to her telling her she's worthless and other bad things. No command voices. Pt denies SI and HI, indicating that she usually starts having those symptoms after starting with AVH so she came in for help before those thoughts started. Pt reports recent cocaine and alcohol use (2 days ago) and decreased sleep (2 hours/night for past 2-3 days).    Diagnosis: Bipolar I  Past Medical History:  Past Medical History:  Diagnosis Date  . Bipolar 1 disorder (HCC)   . Depression   . Drug abuse   . Schizophrenia [facility])     Past Surgical History:  Procedure Laterality Date  . NO PAST SURGERIES      Family History:  Family History  Problem Relation Age of Onset  . Mental illness Maternal Aunt   . Cancer Maternal Aunt     breast   . Mental illness Paternal Aunt   . Anesthesia problems Neg Hx   . Hypotension Neg Hx   . Malignant hyperthermia Neg Hx   . Pseudochol deficiency Neg Hx     Social History:  reports that she has been smoking Cigarettes and Cigars.  She has a 2.50 pack-year smoking history. She has never used smokeless tobacco. She reports that she drinks about 1.8 oz of alcohol per week . She reports that she does not use drugs.  Additional Social History:  Alcohol / Drug Use Pain Medications: pt denies Prescriptions: pt prescribed Seroquel but says she ran out @ a week ago Over the Counter: pt denies History of alcohol / drug use?: Yes Longest period of sobriety (when/how long): 3  years Substance #1 Name of Substance 1: Cocaine 1 - Amount (size/oz): varies 1 - Frequency: once a week 1 - Duration: ongoing 1 - Last Use / Amount: 2 days ago (03/03/2017)  CIWA:   COWS:    Allergies: No Known Allergies  Home Medications:  (Not in a hospital admission)  OB/GYN Status:  No LMP recorded. Patient is not currently having periods (Reason: Perimenopausal).  General Assessment Data Location of Assessment: [facility] Assessment Services TTS Assessment: In system Is this a Tele or Face-to-Face Assessment?: Face-to-Face Is this an Initial Assessment or a Re-assessment for this encounter?: Initial Assessment Marital status: Single Is patient pregnant?: No Pregnancy Status: No Living Arrangements: Alone Can pt return to current living arrangement?: Yes Admission Status: Voluntary Is patient capable of signing voluntary admission?: Yes Referral Source: Self/Family/Friend Insurance type: [facility] Medicare     Crisis Care Plan Living Arrangements: Alone Name of Psychiatrist: Transport planner Name of Therapist: Transport planner  Education Status Is patient currently in school?: No  Risk to self with the past 6 months Suicidal Ideation: No Has patient been a risk to self within the past 6 months prior to admission? : No Suicidal Intent: No Has patient had any suicidal intent within the past 6 months prior to admission? : No Is patient at risk for suicide?: No Suicidal Plan?: No Has  patient had any suicidal plan within the past 6 months prior to admission? : No Access to Means: No What has been your use of drugs/alcohol within the last 12 months?: see above Previous Attempts/Gestures: Yes How many times?: 5 Triggers for Past Attempts: Unknown, Unpredictable Intentional Self Injurious Behavior: None Family Suicide History: Unknown Recent stressful life event(s): Other (Comment) (off medication) Persecutory voices/beliefs?: Yes Depression: No Depression Symptoms: Insomnia Substance abuse  history and/or treatment for substance abuse?: Yes Suicide prevention information given to non-admitted patients: Not applicable  Risk to Others within the past 6 months Homicidal Ideation: No Does patient have any lifetime risk of violence toward others beyond the six months prior to admission? : No Thoughts of Harm to Others: No Current Homicidal Intent: No Current Homicidal Plan: No Access to Homicidal Means: No History of harm to others?: No Assessment of Violence: None Noted Does patient have access to weapons?: No Criminal Charges Pending?: No Does patient have a court date: No Is patient on probation?: No  Psychosis Hallucinations: Auditory, Visual Delusions: None noted  Mental Status Report Appearance/Hygiene: Disheveled Eye Contact: Good Motor Activity: Unremarkable Speech: Logical/coherent Level of Consciousness: Drowsy Mood: Pleasant Affect: Appropriate to circumstance Anxiety Level: Minimal Thought Processes: Coherent, Relevant Judgement: Partial Orientation: Appropriate for developmental age, Situation, Place, Time, Person Obsessive Compulsive Thoughts/Behaviors: None  Cognitive Functioning Concentration: Normal Memory: Unable to Assess IQ: Average Insight: see judgement above Impulse Control: Fair Appetite: Good Sleep: Decreased Total Hours of Sleep: 2 Vegetative Symptoms: None  ADLScreening [facility] Assessment Services) Patient's cognitive ability adequate to safely complete daily activities?: Yes Patient able to express need for assistance with ADLs?: Yes Independently performs ADLs?: Yes (appropriate for developmental age)  Prior Inpatient Therapy Prior Inpatient Therapy: Yes Prior Therapy Dates: multiple Prior Therapy Facilty/Provider(s): [facility] Reason for Treatment: bipolar  Prior Outpatient Therapy Prior Outpatient Therapy: No Does patient have an ACCT team?: No Does patient have Intensive In-House Services?  : No Does patient have [facility]  services? : Yes Does patient have P4CC services?: No  ADL Screening (condition at time of admission) Patient's cognitive ability adequate to safely complete daily activities?: Yes Is the patient deaf or have difficulty hearing?: No Does the patient have difficulty seeing, even when wearing glasses/contacts?: No Does the patient have difficulty concentrating, remembering, or making decisions?: No Patient able to express need for assistance with ADLs?: Yes Does the patient have difficulty dressing or bathing?: No Independently performs ADLs?: Yes (appropriate for developmental age) Does the patient have difficulty walking or climbing stairs?: No Weakness of Legs: None Weakness of Arms/Hands: None  Home Assistive Devices/Equipment Home Assistive Devices/Equipment: None  Therapy Consults (therapy consults require a physician order) PT Evaluation Needed: No OT Evalulation Needed: No SLP Evaluation Needed: No Abuse/Neglect Assessment (Assessment to be complete while patient is alone) Physical Abuse: Denies Verbal Abuse: Denies Sexual Abuse: Denies Exploitation of patient/patient's resources: Denies Self-Neglect: Denies Values / Beliefs Cultural Requests During Hospitalization: None Spiritual Requests During Hospitalization: None Consults Spiritual Care Consult Needed: No Social Work Consult Needed: No Merchant navy officer (For Healthcare) Does Patient Have a Medical Advance Directive?: No Would patient like information on creating a medical advance directive?: No - Patient declined    Additional Information 1:1 In Past 12 Months?: No CIRT Risk: No Elopement Risk: No Does patient have medical clearance?: No     Disposition:  Disposition Initial Assessment Completed for this Encounter: Yes (consulted with May Agustin, NP) Disposition of Patient: Other dispositions Other disposition(s): To current provider (  [facility])  On Site Evaluation by:   Reviewed with Physician:     Laddie AquasSamantha M Eisen Robenson 03/05/2017 9:42 AM

## 2017-03-08 DIAGNOSIS — F3113 Bipolar disorder, current episode manic without psychotic features, severe: Secondary | ICD-10-CM | POA: Diagnosis not present

## 2018-04-01 ENCOUNTER — Encounter (HOSPITAL_COMMUNITY): Payer: Self-pay

## 2018-04-01 ENCOUNTER — Ambulatory Visit (HOSPITAL_COMMUNITY)
Admission: RE | Admit: 2018-04-01 | Discharge: 2018-04-01 | Disposition: A | Discharge status: Home / Self Care | Payer: Medicare Other | Attending: Psychiatry | Admitting: Psychiatry

## 2018-04-01 ENCOUNTER — Emergency Department (HOSPITAL_COMMUNITY)
Admission: EM | Admit: 2018-04-01 | Discharge: 2018-04-02 | Disposition: A | Discharge status: Home / Self Care | Payer: Medicare Other | Attending: Emergency Medicine | Admitting: Emergency Medicine

## 2018-04-01 ENCOUNTER — Other Ambulatory Visit: Payer: Self-pay

## 2018-04-01 ENCOUNTER — Emergency Department (HOSPITAL_COMMUNITY): Payer: Medicare Other

## 2018-04-01 DIAGNOSIS — F1220 Cannabis dependence, uncomplicated: Secondary | ICD-10-CM | POA: Insufficient documentation

## 2018-04-01 DIAGNOSIS — F259 Schizoaffective disorder, unspecified: Secondary | ICD-10-CM | POA: Insufficient documentation

## 2018-04-01 DIAGNOSIS — R0789 Other chest pain: Secondary | ICD-10-CM | POA: Diagnosis not present

## 2018-04-01 DIAGNOSIS — F1420 Cocaine dependence, uncomplicated: Secondary | ICD-10-CM | POA: Insufficient documentation

## 2018-04-01 DIAGNOSIS — F209 Schizophrenia, unspecified: Secondary | ICD-10-CM | POA: Diagnosis not present

## 2018-04-01 DIAGNOSIS — F17210 Nicotine dependence, cigarettes, uncomplicated: Secondary | ICD-10-CM | POA: Insufficient documentation

## 2018-04-01 DIAGNOSIS — Z79899 Other long term (current) drug therapy: Secondary | ICD-10-CM | POA: Insufficient documentation

## 2018-04-01 DIAGNOSIS — F319 Bipolar disorder, unspecified: Secondary | ICD-10-CM | POA: Insufficient documentation

## 2018-04-01 DIAGNOSIS — R079 Chest pain, unspecified: Secondary | ICD-10-CM

## 2018-04-01 LAB — CBC
HCT: 46.6 % — ABNORMAL HIGH (ref 36.0–46.0)
HEMOGLOBIN: 15.3 g/dL — AB (ref 12.0–15.0)
MCH: 29.4 pg (ref 26.0–34.0)
MCHC: 32.8 g/dL (ref 30.0–36.0)
MCV: 89.4 fL (ref 78.0–100.0)
Platelets: 242 10*3/uL (ref 150–400)
RBC: 5.21 MIL/uL — ABNORMAL HIGH (ref 3.87–5.11)
RDW: 14.5 % (ref 11.5–15.5)
WBC: 4.7 10*3/uL (ref 4.0–10.5)

## 2018-04-01 LAB — BASIC METABOLIC PANEL
ANION GAP: 11 (ref 5–15)
BUN: 16 mg/dL (ref 6–20)
CALCIUM: 9.1 mg/dL (ref 8.9–10.3)
CO2: 23 mmol/L (ref 22–32)
Chloride: 103 mmol/L (ref 101–111)
Creatinine, Ser: 1.06 mg/dL — ABNORMAL HIGH (ref 0.44–1.00)
GFR calc Af Amer: >60 mL/min (ref >60)
GFR, EST NON AFRICAN AMERICAN: 58 mL/min — AB (ref >60)
GLUCOSE: 115 mg/dL — AB (ref 65–99)
Potassium: 3.7 mmol/L (ref 3.5–5.1)
SODIUM: 137 mmol/L (ref 135–145)

## 2018-04-01 LAB — I-STAT TROPONIN, ED: TROPONIN I, POC: 0 ng/mL (ref 0.00–0.08)

## 2018-04-01 NOTE — H&P (Signed)
[facility]. [facility] Observed patient in the lobby [facility] Her blood pressure was noted to be elevated at 157/138. Patient reported "a pulsating pain in my chest. I used cocaine yesterday." [facility] due to acute [facility] pain.

## 2018-04-01 NOTE — ED Notes (Signed)
This EMT attempted repeat I-stat troponin collection from pt. Pt refused. RN notified

## 2018-04-01 NOTE — ED Triage Notes (Signed)
[facility]. Pt reports CP started after using cocaine and ETOH. Reports tightness in central chest with numbness in fingers and lips. Pt reports pain is central and non radiating.

## 2018-04-02 ENCOUNTER — Inpatient Hospital Stay (HOSPITAL_COMMUNITY)
Admission: AD | Admit: 2018-04-02 | Discharge: 2018-04-08 | DRG: 885 | Disposition: A | Discharge status: Home / Self Care | Payer: Medicare Other | Source: Intra-hospital | Attending: Psychiatry | Admitting: Psychiatry

## 2018-04-02 DIAGNOSIS — Z818 Family history of other mental and behavioral disorders: Secondary | ICD-10-CM | POA: Diagnosis not present

## 2018-04-02 DIAGNOSIS — F1420 Cocaine dependence, uncomplicated: Secondary | ICD-10-CM | POA: Diagnosis present

## 2018-04-02 DIAGNOSIS — F250 Schizoaffective disorder, bipolar type: Secondary | ICD-10-CM | POA: Diagnosis present

## 2018-04-02 DIAGNOSIS — G4700 Insomnia, unspecified: Secondary | ICD-10-CM | POA: Diagnosis present

## 2018-04-02 DIAGNOSIS — F17210 Nicotine dependence, cigarettes, uncomplicated: Secondary | ICD-10-CM | POA: Diagnosis present

## 2018-04-02 DIAGNOSIS — Z7289 Other problems related to lifestyle: Secondary | ICD-10-CM | POA: Diagnosis not present

## 2018-04-02 DIAGNOSIS — R451 Restlessness and agitation: Secondary | ICD-10-CM | POA: Diagnosis not present

## 2018-04-02 DIAGNOSIS — R634 Abnormal weight loss: Secondary | ICD-10-CM | POA: Diagnosis present

## 2018-04-02 DIAGNOSIS — Z6820 Body mass index (BMI) 20.0-20.9, adult: Secondary | ICD-10-CM | POA: Diagnosis not present

## 2018-04-02 DIAGNOSIS — R45851 Suicidal ideations: Secondary | ICD-10-CM | POA: Diagnosis present

## 2018-04-02 DIAGNOSIS — F1099 Alcohol use, unspecified with unspecified alcohol-induced disorder: Secondary | ICD-10-CM | POA: Diagnosis not present

## 2018-04-02 DIAGNOSIS — R450 Nervousness: Secondary | ICD-10-CM | POA: Diagnosis not present

## 2018-04-02 DIAGNOSIS — F419 Anxiety disorder, unspecified: Secondary | ICD-10-CM | POA: Diagnosis present

## 2018-04-02 DIAGNOSIS — N760 Acute vaginitis: Secondary | ICD-10-CM | POA: Diagnosis present

## 2018-04-02 DIAGNOSIS — F1490 Cocaine use, unspecified, uncomplicated: Secondary | ICD-10-CM | POA: Diagnosis not present

## 2018-04-02 DIAGNOSIS — Z736 Limitation of activities due to disability: Secondary | ICD-10-CM | POA: Diagnosis not present

## 2018-04-02 DIAGNOSIS — F1290 Cannabis use, unspecified, uncomplicated: Secondary | ICD-10-CM | POA: Diagnosis not present

## 2018-04-02 DIAGNOSIS — F17290 Nicotine dependence, other tobacco product, uncomplicated: Secondary | ICD-10-CM | POA: Diagnosis present

## 2018-04-02 DIAGNOSIS — Z7251 High risk heterosexual behavior: Secondary | ICD-10-CM | POA: Diagnosis not present

## 2018-04-02 LAB — SALICYLATE LEVEL: Salicylate Lvl: <7 mg/dL (ref 2.8–30.0)

## 2018-04-02 LAB — RAPID URINE DRUG SCREEN, HOSP PERFORMED
AMPHETAMINES: NOT DETECTED
Barbiturates: NOT DETECTED
Benzodiazepines: NOT DETECTED
Cocaine: POSITIVE — AB
OPIATES: NOT DETECTED
TETRAHYDROCANNABINOL: POSITIVE — AB

## 2018-04-02 LAB — I-STAT TROPONIN, ED: TROPONIN I, POC: 0 ng/mL (ref 0.00–0.08)

## 2018-04-02 LAB — ETHANOL

## 2018-04-02 LAB — ACETAMINOPHEN LEVEL: Acetaminophen (Tylenol), Serum: <10 ug/mL — ABNORMAL LOW (ref 10–30)

## 2018-04-02 MED ORDER — ENSURE ENLIVE PO LIQD
237.0000 mL | Freq: Two times a day (BID) | ORAL | Status: DC
  Filled 2018-04-02: qty 237

## 2018-04-02 MED ORDER — MAGNESIUM HYDROXIDE 400 MG/5ML PO SUSP: 30.0000 mL | Freq: Every day | ORAL | Status: DC | PRN

## 2018-04-02 MED ORDER — LAMOTRIGINE 25 MG PO TABS: 50.0000 mg | ORAL_TABLET | Freq: Every evening | ORAL | Status: DC

## 2018-04-02 MED ORDER — QUETIAPINE FUMARATE 200 MG PO TABS
200.0000 mg | ORAL_TABLET | Freq: Every evening | ORAL | Status: DC | PRN
  Administered 2018-04-02: 200 mg via ORAL
  Filled 2018-04-02 (×4): qty 1

## 2018-04-02 MED ORDER — ACETAMINOPHEN 325 MG PO TABS
650.0000 mg | ORAL_TABLET | Freq: Four times a day (QID) | ORAL | Status: DC | PRN
  Administered 2018-04-02 – 2018-04-07 (×4): 650 mg via ORAL
  Filled 2018-04-02 (×4): qty 2

## 2018-04-02 MED ORDER — LAMOTRIGINE 25 MG PO TABS
50.0000 mg | ORAL_TABLET | Freq: Every evening | ORAL | Status: DC
  Filled 2018-04-02 (×2): qty 2

## 2018-04-02 MED ORDER — ALUM & MAG HYDROXIDE-SIMETH 200-200-20 MG/5ML PO SUSP: 30.0000 mL | ORAL | Status: DC | PRN

## 2018-04-02 MED ORDER — QUETIAPINE FUMARATE 400 MG PO TABS
400.0000 mg | ORAL_TABLET | Freq: Every day | ORAL | Status: DC
  Filled 2018-04-02: qty 1

## 2018-04-02 MED ORDER — ENSURE ENLIVE PO LIQD
237.0000 mL | Freq: Two times a day (BID) | ORAL | Status: DC
  Administered 2018-04-03 – 2018-04-08 (×6): 237 mL via ORAL

## 2018-04-02 NOTE — ED Triage Notes (Signed)
PT reports she wants to moved to another hospital because she is tired of being in the hall. Pt reports she has a morning TV show she watches every morning and that keeps her balanced

## 2018-04-02 NOTE — Progress Notes (Signed)
[facility] Stephanie SievertSpencer Simon, PA is the accepting provider.  Dr. Altamese Carolinaainville is the attending provider.  Call report to [phone]  [email or web address] notified.   Pt is Voluntary.  Pt may be transported by [facility]  Pt [facility]  [facility]  Carney BernJean T. Kaylyn LimSutter, MSW, LCSWA Disposition Clinical Social Work [phone] (cell) [phone] (office)

## 2018-04-02 NOTE — Progress Notes (Signed)
Pt has been off her Seroquel for 2 weeks, so PA- Spencer gave pt 200 mg with repeat  If necessary. Pt stated 400 would be too much for her now .

## 2018-04-02 NOTE — ED Notes (Signed)
Belongings inventoried; wanded by security

## 2018-04-02 NOTE — ED Notes (Signed)
Pt signed consent for transfer, [facility] and consent for treatment (paper).  Belongings including valuables given to [facility], pt signed inventory sheet.

## 2018-04-02 NOTE — ED Notes (Signed)
Snack given to patient.  

## 2018-04-02 NOTE — BH Assessment (Addendum)
Tele Assessment Note   Patient Name: [name] MRN: [id number] Referring Physician: Antony Madura, PA Location of Patient: [facility] Location of Provider: Behavioral Health TTS Department  [name] is an 57 y.o. female who came to [facility] as a Walk-In today complaining of chest pain. Pt was seen by NP and sent for evaluation by EMS to [facility]. Pt sts she had been "on a 5 day binge of crack cocaine." Pt sts she was trying to kill herself by overdosing. Pt sts she has been experiencing worsening depression for the last few weeks. Pt could not identify a precipitating event. Pt sts she had been sober from all substances for about 3 years and then relapsed. Pt sts she has been lonely with none of her family nearby. Pt sts she has 2 adult children who she does not see often. Pt has a hx of Schizoaffective D/O, Bipolar type; MDD, polysubstance abuse and suicide attempts. Pt denies HI, SHI and AVH. Pt sts she has not been taking her prescribed medications "in a long while." Pt sts she was going to [facility] for her medications but she she just stopped going and then ran out. Pt sts she currently has no OP providers. Pt has been psychiatrically hospitalized multiple times from 2014 to 2017 at [facility]. Pt sts she has not been admitted since May 2017 and was "doing better" until a few weeks ago.   Pt sts she lives with her BF and some other friends. Pt sts she has never been married and has 2 adults children, a son and a daughter. Pt sts she completed school with some college credits. Pt sts she is not employed and receives disability income. Pt denies any hx of violence, aggression and issues with LE. Pt denies access to guns. Pt sts she has not slept in 5 days and before that was only sleeping about 4 hours per night. Pt sts she has not eaten anything in 5 days and before that was eating regularly. Pt sts she has lost about 10 pounds in the last 2 weeks. Pt denies any hx of abuse. Pt's symptoms of depression including  sadness, fatigue, excessive guilt, decreased self esteem, tearfulness / crying spells, self isolation, lack of motivation for activities and pleasure, irritability, negative outlook, difficulty thinking & concentrating, feeling helpless and hopeless, sleep and eating disturbances.  Pt sts she does not have a hx of anxiety issues.and denies symptoms. Pt sts she binged on crack cocaine for 5 days, drank alcohol and used cannabis over the last 5 days. Pt sts she also smokes 1/4 pack of cigarettes per day. Pt's UDS was not available at the time of assessment.   Pt was dressed in scrubs and sitting on her hospital bed. Pt appeared drowsy and periodically yawned throughout. Pt was semi-alert, cooperative and polite. Pt kept good eye contact, spoke in a clear tone and at a normal pace. Pt moved in a normal manner when moving. Pt's thought process was coherent and relevant and judgement was impaired.  No indication of delusional thinking or response to internal stimuli. Pt's mood was stated as depressed but not anxious and her blunted affect was congruent.  Pt was oriented x 4, to person, place, time and situation.    Diagnosis: F25.0 Schizoaffective D/O, Bipolar type; MDD; Cocaine Use D/O, Severe; Alcohol Use D/O, Moderate; Cannabis Use D/O, Moderate  Past Medical History:  Past Medical History:  Diagnosis Date  . Bipolar 1 disorder (HCC)   . Depression   . Drug  abuse (HCC)   . Schizophrenia [facility])     Past Surgical History:  Procedure Laterality Date  . NO PAST SURGERIES      Family History:  Family History  Problem Relation Age of Onset  . Mental illness Maternal Aunt   . Cancer Maternal Aunt        breast   . Mental illness Paternal Aunt   . Anesthesia problems Neg Hx   . Hypotension Neg Hx   . Malignant hyperthermia Neg Hx   . Pseudochol deficiency Neg Hx     Social History:  reports that she has been smoking cigarettes and cigars.  She has a 2.50 pack-year smoking history. She has  never used smokeless tobacco. She reports that she drinks about 1.8 oz of alcohol per week. She reports that she does not use drugs.  Additional Social History:  Alcohol / Drug Use Prescriptions: SEE MAR History of alcohol / drug use?: Yes Longest period of sobriety (when/how long): 3 YRS Substance #1 Name of Substance 1: CRACK COCAINE 1 - Age of First Use: 32 1 - Amount (size/oz): VARIES 1 - Frequency: JUST OFF A 5 DAY BINGE 1 - Duration: ONGOING 1 - Last Use / Amount: YESTERDAY Substance #2 Name of Substance 2: ALCOHOL 2 - Age of First Use: 20 2 - Amount (size/oz): 3 CANS OF BEER 2 - Frequency: WEEKLY 2 - Duration: ONGOING 2 - Last Use / Amount: YESTERDAY Substance #3 Name of Substance 3: CANNABIS 3 - Age of First Use: 18 3 - Amount (size/oz): VARIES 3 - Frequency: 1 X MONTH 3 - Duration: ONGOING 3 - Last Use / Amount: YESTERDAY Substance #4 Name of Substance 4: NICOTINE/CIGARETTES 4 - Age of First Use: 32 4 - Amount (size/oz): 1/4 PACK 4 - Frequency: DAY 4 - Duration: ONGOING 4 - Last Use / Amount: YESTERDAY  CIWA: CIWA-Ar BP: (!) 124/54 Pulse Rate: 68 COWS:    Allergies: No Known Allergies  Home Medications:  (Not in a hospital admission)  OB/GYN Status:  No LMP recorded. (Menstrual status: Perimenopausal).  General Assessment Data Location of Assessment: [facility] ED TTS Assessment: In system Is this a Tele or Face-to-Face Assessment?: Tele Assessment Is this an Initial Assessment or a Re-assessment for this encounter?: Initial Assessment Marital status: Single Maiden name: Flammia Is patient pregnant?: Unknown Pregnancy Status: Unknown Living Arrangements: Spouse/significant other, Non-relatives/Friends Can pt return to current living arrangement?: Yes Admission Status: Voluntary Is patient capable of signing voluntary admission?: Yes Referral Source: Self/Family/Friend Insurance type: [facility] MEDICARE     Crisis Care Plan Living Arrangements:  Spouse/significant other, Non-relatives/Friends Name of Psychiatrist: [facility] Name of Therapist: NONE  Education Status Is patient currently in school?: No Is the patient employed, unemployed or receiving disability?: Receiving disability income  Risk to self with the past 6 months Suicidal Ideation: Yes-Currently Present Has patient been a risk to self within the past 6 months prior to admission? : No Suicidal Intent: Yes-Currently Present Has patient had any suicidal intent within the past 6 months prior to admission? : No Is patient at risk for suicide?: Yes Suicidal Plan?: Yes-Currently Present Has patient had any suicidal plan within the past 6 months prior to admission? : No Specify Current Suicidal Plan: STS TRIED TO OD ON COCAINE YESTERDAY Access to Means: Yes Specify Access to Suicidal Means: BUYS FROM FRIENDS What has been your use of drugs/alcohol within the last 12 months?: DAILY USE Previous Attempts/Gestures: Yes How many times?: 5 Other Self Harm Risks:  NONE REPORTED Triggers for Past Attempts: Other (Comment)(STS SHE HAS NO FAMILY IN [place]; LONLINESS) Intentional Self Injurious Behavior: None Family Suicide History: Unknown Recent stressful life event(s): (NONE REPORTED) Persecutory voices/beliefs?: No Depression: Yes Depression Symptoms: Despondent, Insomnia, Tearfulness, Isolating, Fatigue, Guilt, Loss of interest in usual pleasures, Feeling worthless/self pity, Feeling angry/irritable Substance abuse history and/or treatment for substance abuse?: Yes Suicide prevention information given to non-admitted patients: Not applicable  Risk to Others within the past 6 months Homicidal Ideation: No Does patient have any lifetime risk of violence toward others beyond the six months prior to admission? : No Thoughts of Harm to Others: No Current Homicidal Intent: No Current Homicidal Plan: No Access to Homicidal Means: No Identified Victim: NONE History of harm to  others?: No Assessment of Violence: None Noted Violent Behavior Description: NA Does patient have access to weapons?: No Criminal Charges Pending?: No Does patient have a court date: No Is patient on probation?: No  Psychosis Hallucinations: None noted Delusions: None noted  Mental Status Report Appearance/Hygiene: Disheveled Eye Contact: Good Motor Activity: Freedom of movement, Restlessness Speech: Logical/coherent Level of Consciousness: Alert, Drowsy Mood: Depressed, Pleasant Affect: Depressed, Blunted Anxiety Level: Minimal Thought Processes: Coherent, Relevant Judgement: Impaired Orientation: Person, Place, Time, Situation Obsessive Compulsive Thoughts/Behaviors: None  Cognitive Functioning Concentration: Decreased Memory: Recent Intact, Remote Intact Is patient IDD: No Is patient DD?: No Insight: Poor Impulse Control: Poor Appetite: Fair Have you had any weight changes? : Loss Amount of the weight change? (lbs): 10 lbs(2 WEEKS) Sleep: Decreased Total Hours of Sleep: (NONE FOR LAST 5 DAYS (BINGE)) Vegetative Symptoms: None  ADLScreening [facility]) Patient's cognitive ability adequate to safely complete daily activities?: Yes Patient able to express need for assistance with ADLs?: Yes Independently performs ADLs?: Yes (appropriate for developmental age)  Prior Inpatient Therapy Prior Inpatient Therapy: Yes Prior Therapy Dates: 2014-2017 [facility] Reason for Treatment: SCHIZOAFFECTIVE D/O  Prior Outpatient Therapy Prior Outpatient Therapy: No Does patient have an ACCT team?: No Does patient have Intensive In-House Services?  : No Does patient have [facility] services? : Yes(HAS NOT BEEN "IN A WHILE") Does patient have P4CC services?: No  ADL Screening (condition at time of admission) Patient's cognitive ability adequate to safely complete daily activities?: Yes Patient able to express need for assistance with  ADLs?: Yes Independently performs ADLs?: Yes (appropriate for developmental age)       Abuse/Neglect Assessment (Assessment to be complete while patient is alone) Physical Abuse: Denies Verbal Abuse: Denies Sexual Abuse: Denies Exploitation of patient/patient's resources: Denies Self-Neglect: Denies     Merchant navy officerAdvance Directives (For Healthcare) Does Patient Have a Medical Advance Directive?: No Would patient like information on creating a medical advance directive?: No - Patient declined          Disposition:  Disposition Initial Assessment Completed for this Encounter: Yes Patient referred to: Other (Comment)  This service was provided via telemedicine using a 2-way, interactive audio and video technology.  Names of all persons participating in this telemedicine service and their role in this encounter. Name: Beryle FlockMary Kylie Simmonds, MS, [facility], [facility] Role: Triage Specialist  Name: Hollice GongKimberly Kun Role: Patient  Name:  Role:   Name:  Role:    Consulted Donell SievertSpencer Simon PA. Recommend Inpatient admission and treatment.  Per Clint Bolderori Beck, AC, no appropriate beds currently available. Will seek outside placement.   Spoke with Antony MaduraKelly Humes, PA and advised of recommendation.   Beryle FlockMary Charleston Vierling, MS, CRC, [facility] Triage Specialist [facility] Carolanne GrumblingFarmer,Drea Jurewicz T  04/02/2018 3:40 AM

## 2018-04-02 NOTE — ED Triage Notes (Signed)
Lunch ordered 

## 2018-04-02 NOTE — ED Triage Notes (Signed)
Pt agitated because she is in a hall bed. Pt reports her insurance does not pay for her to be in the hall. Pt offered a shower but Pt refused.

## 2018-04-02 NOTE — ED Provider Notes (Signed)
[facility] EMERGENCY DEPARTMENT Provider Note   CSN: [id number] Arrival date & time: 04/01/18  1658     History   Chief Complaint Chief Complaint  Patient presents with  . Chest Pain    HPI [name] is a 57 y.o. female.   57 year old female with a history of depression, bipolar 1 disorder, schizophrenia, drug abuse presents to the emergency department from behavioral health for complaints of chest pain.  She reports intermittent central chest pain described as a tightness which began yesterday and has been intermittent.  Chest pain associated with paresthesias in her bilateral fingers and SOB.  No N/V.  Chest pain began in the setting of a 5-day crack cocaine binge.  She reports last using crack cocaine 2 days ago.  She has not eaten in 5 days and reports worsening depression secondary to medication noncompliance.  She has also been drinking alcohol.  She reports receiving 2 tablets of nitroglycerin in route to the ED from behavioral health.  This resolved her chest pain and she denies any recurrence since receiving this medicine.  She is wanting to discuss her worsening depression with a psychiatrist.     Past Medical History:  Diagnosis Date  . Bipolar 1 disorder (HCC)   . Depression   . Drug abuse (HCC)   . Schizophrenia [facility])     Patient Active Problem List   Diagnosis Date Noted  . Bacterial vaginosis 03/01/2017  . Schizoaffective disorder, bipolar type (HCC) 05/18/2016  . Annual physical exam 02/15/2016  . Encounter for routine gynecological examination 02/15/2016  . Breast cancer screening 02/15/2016  . Affective psychosis, bipolar (HCC)   . Cannabis use disorder, moderate, dependence (HCC) 09/22/2015  . Cocaine use disorder, moderate, dependence (HCC) 09/22/2015  . UTI (lower urinary tract infection) 09/04/2012    Past Surgical History:  Procedure Laterality Date  . NO PAST SURGERIES       OB History   None      Home  Medications    Prior to Admission medications   Medication Sig Start Date End Date Taking? Authorizing Provider  Multiple Vitamin (MULTIVITAMIN WITH MINERALS) TABS tablet Take 1 tablet by mouth daily.   Yes [provider]  QUEtiapine (SEROQUEL) 400 MG tablet Take 1 tablet (400 mg total) by mouth at bedtime. 05/21/16  Yes Oneta Rack, NP  lamoTRIgine (LAMICTAL) 25 MG tablet Take 2 tablets (50 mg total) by mouth every evening. Patient not taking: Reported on 03/01/2017 05/21/16   Oneta Rack, NP  metroNIDAZOLE (FLAGYL) 500 MG tablet Take 1 tablet (500 mg total) by mouth 2 (two) times daily. Patient not taking: Reported on 04/02/2018 03/01/17   Massie Maroon, FNP    Family History Family History  Problem Relation Age of Onset  . Mental illness Maternal Aunt   . Cancer Maternal Aunt        breast   . Mental illness Paternal Aunt   . Anesthesia problems Neg Hx   . Hypotension Neg Hx   . Malignant hyperthermia Neg Hx   . Pseudochol deficiency Neg Hx     Social History Social History   Tobacco Use  . Smoking status: Current Every Day Smoker    Packs/day: 0.25    Years: 10.00    Pack years: 2.50    Types: Cigarettes, Cigars  . Smokeless tobacco: Never Used  Substance Use Topics  . Alcohol use: Yes    Alcohol/week: 1.8 oz    Types: 3 Cans  of beer per week    Comment: once a week a 40 oz.   . Drug use: No    Types: Marijuana, Cocaine    Comment: occasionally     Allergies   Patient has no known allergies.   Review of Systems Review of Systems Ten systems reviewed and are negative for acute change, except as noted in the HPI.    Physical Exam Updated Vital Signs BP (!) 124/54   Pulse 68   Temp 98.5 F (36.9 C) (Oral)   Resp 20   Ht 5' (1.524 m)   Wt 47.6 kg (105 lb)   SpO2 97%   BMI 20.51 kg/m   Physical Exam  Constitutional: She is oriented to person, place, and time. She appears well-developed and well-nourished. No distress.  Nontoxic  appearing and in no distress  HENT:  Head: Normocephalic and atraumatic.  Eyes: Conjunctivae and EOM are normal. No scleral icterus.  Neck: Normal range of motion.  Cardiovascular: Normal rate, regular rhythm and intact distal pulses.  Pulmonary/Chest: Effort normal. No stridor. No respiratory distress. She has no wheezes. She has no rales.  Lungs clear to auscultation bilaterally  Musculoskeletal: Normal range of motion.  Neurological: She is alert and oriented to person, place, and time. She exhibits normal muscle tone. Coordination normal.  Skin: Skin is warm and dry. No rash noted. She is not diaphoretic. No erythema. No pallor.  Psychiatric: She has a normal mood and affect. Her speech is rapid and/or pressured. She is hyperactive (mild). She expresses no homicidal ideation. She expresses no homicidal plans.  Nursing note and vitals reviewed.    ED Treatments / Results  Labs (all labs ordered are listed, but only abnormal results are displayed) Labs Reviewed  BASIC METABOLIC PANEL - Abnormal; Notable for the following components:      Result Value   Glucose, Bld 115 (*)    Creatinine, Ser 1.06 (*)    GFR calc non Af Amer 58 (*)    All other components within normal limits  CBC - Abnormal; Notable for the following components:   RBC 5.21 (*)    Hemoglobin 15.3 (*)    HCT 46.6 (*)    All other components within normal limits  ACETAMINOPHEN LEVEL - Abnormal; Notable for the following components:   Acetaminophen (Tylenol), Serum <10 (*)    All other components within normal limits  SALICYLATE LEVEL  ETHANOL  RAPID URINE DRUG SCREEN, HOSP PERFORMED  I-STAT TROPONIN, ED  I-STAT TROPONIN, ED    EKG ED ECG REPORT   Date: 04/02/2018  Rate: 86  Rhythm: normal sinus rhythm  QRS Axis: left  Intervals: PR shortened  ST/T Wave abnormalities: nonspecific ST changes  Conduction Disutrbances:none  Narrative Interpretation: NSR with short PR and prolonged QT; NSST changes, LAD   Old EKG Reviewed: none available  I have personally reviewed the EKG tracing and agree with the computerized printout as noted.   Radiology Dg Chest 2 View  Result Date: 04/01/2018 CLINICAL DATA:  Chest pain since yesterday EXAM: CHEST - 2 VIEW COMPARISON:  06/29/2016 FINDINGS: The heart size and mediastinal contours are within normal limits. Both lungs are clear. The visualized skeletal structures are unremarkable. IMPRESSION: No active cardiopulmonary disease. Electronically Signed   By: Tollie Eth M.D.   On: 04/01/2018 17:49    Procedures Procedures (including critical care time)  Medications Ordered in ED Medications - No data to display   Initial Impression / Assessment and Plan / ED  Course  I have reviewed the triage vital signs and the nursing notes.  Pertinent labs & imaging results that were available during my care of the patient were reviewed by me and considered in my medical decision making (see chart for details).     [age] year old female presents to the emergency department for evaluation of chest pain in the setting of cocaine use.  She has been on a cocaine binge over the past 5 days.  Her pain resolved following 2 nitroglycerin tablets and has not recurred.  She has had 2- troponin tests in the emergency department.  EKG without signs of acute ischemia.  Remainder of workup has also been reassuring.  She has been medically cleared and requested evaluation by psychiatry.  TTS recommends inpatient placement.  No beds currently available.  Disposition to be determined by oncoming ED provider.   Final Clinical Impressions(s) / ED Diagnoses   Final diagnoses:  Schizoaffective disorder, unspecified type [facility](HCC)  Chest pain, unspecified type    ED Discharge Orders    None       Antony MaduraHumes, Nemiah Bubar, PA-C 04/02/18 0546    Palumbo, April, MD 04/02/18 [phone]

## 2018-04-02 NOTE — ED Triage Notes (Signed)
PT reports she needs a shower  But when offered a shower Pt refuses. Pt reports she does not want to come back out in hall bed after a shower.

## 2018-04-02 NOTE — ED Notes (Signed)
TTS complete patient return to hallway bed

## 2018-04-02 NOTE — ED Triage Notes (Signed)
PT  [facility]  At [facility]40  Am and was transported via EMS  [facility] Cleared . Pt SI with plan to OD on crack.

## 2018-04-02 NOTE — ED Notes (Signed)
[facility] called 

## 2018-04-02 NOTE — ED Notes (Signed)
Pt ate 100% of supper tray.

## 2018-04-02 NOTE — Tx Team (Signed)
Initial Treatment Plan 04/02/2018 9:58 PM [name] [id number]    PATIENT STRESSORS: Medication change or noncompliance Substance abuse   PATIENT STRENGTHS: Capable of independent living Communication skills General fund of knowledge Motivation for treatment/growth Physical Health   PATIENT IDENTIFIED PROBLEMS: Depression  Suicidal ideation  Substance abuse  "get back to not using again"  "Gain some weight back"  "Get back on meds"           DISCHARGE CRITERIA:  Improved stabilization in mood, thinking, and/or behavior Need for constant or close observation no longer present Verbal commitment to aftercare and medication compliance  PRELIMINARY DISCHARGE PLAN: Outpatient therapy medication management  PATIENT/FAMILY INVOLVEMENT: This treatment plan has been presented to and reviewed with the patient, [name].  The patient and family have been given the opportunity to ask questions and make suggestions.  Levin BaconHeather V Miosotis Wetsel, RN 04/02/2018, 9:58 PM

## 2018-04-03 ENCOUNTER — Other Ambulatory Visit: Payer: Self-pay

## 2018-04-03 ENCOUNTER — Encounter (HOSPITAL_COMMUNITY): Payer: Self-pay

## 2018-04-03 DIAGNOSIS — G4700 Insomnia, unspecified: Secondary | ICD-10-CM

## 2018-04-03 DIAGNOSIS — F1420 Cocaine dependence, uncomplicated: Secondary | ICD-10-CM

## 2018-04-03 DIAGNOSIS — Z818 Family history of other mental and behavioral disorders: Secondary | ICD-10-CM

## 2018-04-03 DIAGNOSIS — Z736 Limitation of activities due to disability: Secondary | ICD-10-CM

## 2018-04-03 DIAGNOSIS — R450 Nervousness: Secondary | ICD-10-CM

## 2018-04-03 DIAGNOSIS — F250 Schizoaffective disorder, bipolar type: Principal | ICD-10-CM

## 2018-04-03 DIAGNOSIS — F419 Anxiety disorder, unspecified: Secondary | ICD-10-CM

## 2018-04-03 DIAGNOSIS — F17210 Nicotine dependence, cigarettes, uncomplicated: Secondary | ICD-10-CM

## 2018-04-03 MED ORDER — QUETIAPINE FUMARATE 50 MG PO TABS
50.0000 mg | ORAL_TABLET | Freq: Every day | ORAL | Status: DC
  Administered 2018-04-03: 250 mg via ORAL
  Administered 2018-04-04: 50 mg via ORAL
  Filled 2018-04-03 (×3): qty 1

## 2018-04-03 MED ORDER — ZIPRASIDONE MESYLATE 20 MG IM SOLR: 20.0000 mg | INTRAMUSCULAR | Status: DC | PRN

## 2018-04-03 MED ORDER — NICOTINE POLACRILEX 2 MG MT GUM
2.0000 mg | CHEWING_GUM | OROMUCOSAL | Status: DC | PRN
  Administered 2018-04-04: 2 mg via ORAL
  Filled 2018-04-03: qty 1

## 2018-04-03 MED ORDER — TRAZODONE HCL 50 MG PO TABS: 50.0000 mg | ORAL_TABLET | Freq: Every evening | ORAL | Status: DC | PRN

## 2018-04-03 MED ORDER — LORAZEPAM 1 MG PO TABS: 1.0000 mg | ORAL_TABLET | ORAL | Status: DC | PRN

## 2018-04-03 MED ORDER — OLANZAPINE 10 MG PO TBDP: 10.0000 mg | ORAL_TABLET | Freq: Three times a day (TID) | ORAL | Status: DC | PRN

## 2018-04-03 MED ORDER — QUETIAPINE FUMARATE 200 MG PO TABS
200.0000 mg | ORAL_TABLET | Freq: Every day | ORAL | Status: DC
  Administered 2018-04-04: 200 mg via ORAL
  Filled 2018-04-03 (×3): qty 1

## 2018-04-03 MED ORDER — HYDROXYZINE HCL 50 MG PO TABS: 50.0000 mg | ORAL_TABLET | Freq: Four times a day (QID) | ORAL | Status: DC | PRN

## 2018-04-03 NOTE — Progress Notes (Signed)
Pt's first full day on the unit.  Pt sts today she can "be off the hook" and will start working on getting better tomorrow.  Pt is happy, smiling and makes good eye contact.  Pt denies SI, HI and AVH and contracts for safety.  Pt denies pain or discomfort and is medication compliant.  Pt attends group and participates. Pt returns to room after 22:00 medications and is in bed sleeping. Pt remains safe on unit with q 15 min checks.
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
## 2018-04-03 NOTE — Progress Notes (Signed)
NUTRITION ASSESSMENT  Pt identified as at risk on the Malnutrition Screen Tool  INTERVENTION: 1. Supplements: continue Ensure Enlive po BID, each supplement provides 350 kcal and 20 grams of protein  NUTRITION DIAGNOSIS: Unintentional weight loss related to sub-optimal intake as evidenced by pt report.   Goal: Pt to meet >/= 90% of their estimated nutrition needs.  Monitor:  PO intake  Assessment:  Pt admitted with substance abuse and SI. Pt was "bingeing on crack cocaine" and ETOH PTA. Pt reports losing 10 lb in the past 2 weeks. [facility] pt was eating 100% of her dinner meals.    Height: Ht Readings from Last 1 Encounters:  04/02/18 5' (1.524 m)    Weight: Wt Readings from Last 1 Encounters:  04/02/18 105 lb (47.6 kg)    Weight Hx: Wt Readings from Last 10 Encounters:  04/02/18 105 lb (47.6 kg)  04/01/18 105 lb (47.6 kg)  03/01/17 122 lb (55.3 kg)  09/03/16 125 lb (56.7 kg)  05/17/16 114 lb 4 oz (51.8 kg)  05/16/16 120 lb (54.4 kg)  02/15/16 127 lb (57.6 kg)  09/21/15 124 lb (56.2 kg)  09/21/15 110 lb (49.9 kg)  04/07/15 123 lb (55.8 kg)    BMI:  Body mass index is 20.51 kg/m. Pt meets criteria for normal based on current BMI.  Estimated Nutritional Needs: Kcal: 25-30 kcal/kg Protein: > 1 gram protein/kg Fluid: 1 ml/kcal  Diet Order: Diet regular Room service appropriate? Yes; Fluid consistency: Thin Pt is also offered choice of unit snacks mid-morning and mid-afternoon.  Pt is eating as desired.   Lab results and medications reviewed.   Tilda FrancoLindsey Cherlyn Syring, MS, RD, LDN [facility] Clinical Dietitian Pager: [phone] After Hours Pager: [phone]

## 2018-04-03 NOTE — H&P (Addendum)
Psychiatric Admission Assessment Adult  Patient Identification: [name] MRN:  [id number]  Date of Evaluation:  04/03/2018  Chief Complaint: 5 days of crack binge triggering worsening depression & suicidal ideations.   Principal Diagnosis: Schizoaffective disorder, bipolar type ([place])  Diagnosis:   Patient Active Problem List   Diagnosis Date Noted  . Schizoaffective disorder, bipolar type ([place]) [F25.0] 05/18/2016    Priority: High  . Cocaine use disorder, moderate, dependence ([place]) [F14.20] 09/22/2015    Priority: Medium  . Bacterial vaginosis [N76.0, B96.89] 03/01/2017  . Annual physical exam [Z00.00] 02/15/2016  . Encounter for routine gynecological examination [Z01.419] 02/15/2016  . Breast cancer screening [Z12.31] 02/15/2016  . Affective psychosis, bipolar ([facility]) [F31.9]   . Cannabis use disorder, moderate, dependence ([place]) [F12.20] 09/22/2015  . UTI (lower urinary tract infection) [N39.0] 09/04/2012   History of Present Illness: This is one of several admission assessment from this hospital alone for [name], a 57 year old AA female with hx of Cocaine use disorder, chronic & chronic mental illness. She is well known on this unit from previous hospitalizations for mood stabilization treatments. She receives her maintenance mental health care on an outpatient basis at [facility]. She is currently admitted to the [facility] with complaints of 5 days of crack cocaine binge triggering worsening depression & suicidal ideations with plans to overdose on the crack. She is seeking help in resuming her mental health medications.  During this assessment, Debar reports, "I'm, sorry that I started using crack again. I have been sober for a while & doing really well. On the 3rd day of this month, I received my disability check. Then, I thought about using, I did, only that I went on a 5 day binge at my friend's house. Then, I looked around & saw that the people that I was using the drugs with  were very young. They were young enough to be my grand kids. Then, I said to myself, was this what it has become for me? What was I doing? Then, I thought, I have got to get myself together. I have not been on my psych medicines in over a week. Then, I started feeling depressed & suicidal. I wanted to overdose on the cocaine & get it over with, but, I came here instead. I need to get back on my medicines (Seroquel 400 at bedtime & 50 mg in the daytime). I don't need any substance abuse treatment because I know better".  Associated Signs/Symptoms: Depression Symptoms:  depressed mood, feelings of worthlessness/guilt,  (Hypo) Manic Symptoms:  Impulsivity,  Anxiety Symptoms:  Excessive Worry, Psychotic Symptoms:  Denies  PTSD Symptoms: NA  Total Time spent with patient: 1 hour  Past Psychiatric History: Cocaine use disorder, Schizoaffective disorder.  Is the patient at risk to self? No.  Has the patient been a risk to self in the past 6 months? Yes.    Has the patient been a risk to self within the distant past? No.  Is the patient a risk to others? No.  Has the patient been a risk to others in the past 6 months? No.  Has the patient been a risk to others within the distant past? No.   Prior Inpatient Therapy: Yes, numerous  Prior Outpatient Therapy: [facility].  Alcohol Screening: 1. How often do you have a drink containing alcohol?: 2 to 4 times a month 2. How many drinks containing alcohol do you have on a typical day when you are drinking?: 1 or 2 3. How often  do you have six or more drinks on one occasion?: Never AUDIT-C Score: 2 9. Have you or someone else been injured as a result of your drinking?: No 10. Has a relative or friend or a doctor or another health worker been concerned about your drinking or suggested you cut down?: No Alcohol Use Disorder Identification Test Final Score (AUDIT): 2 Intervention/Follow-up: AUDIT Score <7 follow-up not indicated Substance Abuse History  in the last 12 months:  Yes.    Consequences of Substance Abuse: Medical Consequences:  Liver damage, Possible death by overdose Legal Consequences:  Arrests, jail time, Loss of driving privilege. Family Consequences:  Family discord, divorce and or separation.  Previous Psychotropic Medications: Yes   Psychological Evaluations: No   Past Medical History:  Past Medical History:  Diagnosis Date  . Bipolar 1 disorder ([place])   . Depression   . Drug abuse ([place])   . Schizophrenia [facility])     Past Surgical History:  Procedure Laterality Date  . NO PAST SURGERIES     Family History:  Family History  Problem Relation Age of Onset  . Mental illness Maternal Aunt   . Cancer Maternal Aunt        breast   . Mental illness Paternal Aunt   . Anesthesia problems Neg Hx   . Hypotension Neg Hx   . Malignant hyperthermia Neg Hx   . Pseudochol deficiency Neg Hx    Family Psychiatric  History: Bipolar disorder: Paternal aunt.  Tobacco Screening: Have you used any form of tobacco in the last 30 days? (Cigarettes, Smokeless Tobacco, Cigars, and/or Pipes): Yes Tobacco use, Select all that apply: 5 or more cigarettes per day Are you interested in Tobacco Cessation Medications?: Yes, will notify MD for an order Counseled patient on smoking cessation including recognizing danger situations, developing coping skills and basic information about quitting provided: Refused/Declined practical counseling  Social History:  Social History   Substance and Sexual Activity  Alcohol Use Yes  . Alcohol/week: 1.8 oz  . Types: 3 Cans of beer per week   Comment: once a week a 40 oz.      Social History   Substance and Sexual Activity  Drug Use No  . Types: Marijuana, Cocaine   Comment: occasionally    Additional Social History:  Allergies:  No Known Allergies  Lab Results:  Results for orders placed or performed during the hospital encounter of 04/01/18 (from the past 48 hour(s))  Basic metabolic  panel     Status: Abnormal   Collection Time: 04/01/18  5:28 PM  Result Value Ref Range   Sodium 137 135 - 145 mmol/L   Potassium 3.7 3.5 - 5.1 mmol/L   Chloride 103 101 - 111 mmol/L   CO2 23 22 - 32 mmol/L   Glucose, Bld 115 (H) 65 - 99 mg/dL   BUN 16 6 - 20 mg/dL   Creatinine, Ser 1.06 (H) 0.44 - 1.00 mg/dL   Calcium 9.1 8.9 - 10.3 mg/dL   GFR calc non Af Amer 58 (L) >60 mL/min   GFR calc Af Amer >60 >60 mL/min    Comment: (NOTE) The eGFR has been calculated using the CKD EPI equation. This calculation has not been validated in all clinical situations. eGFR's persistently <60 mL/min signify possible Chronic Kidney Disease.    Anion gap 11 5 - 15    Comment: Performed at [facility] [address] [place], [place] [address]  CBC     Status:  Abnormal   Collection Time: 04/01/18  5:28 PM  Result Value Ref Range   WBC 4.7 4.0 - 10.5 K/uL   RBC 5.21 (H) 3.87 - 5.11 MIL/uL   Hemoglobin 15.3 (H) 12.0 - 15.0 g/dL   HCT 46.6 (H) 36.0 - 46.0 %   MCV 89.4 78.0 - 100.0 fL   MCH 29.4 26.0 - 34.0 pg   MCHC 32.8 30.0 - 36.0 g/dL   RDW 14.5 11.5 - 15.5 %   Platelets 242 150 - 400 K/uL    Comment: Performed at [facility] [address], [place], [place] [address]  I-stat troponin, ED     Status: None   Collection Time: 04/01/18  5:31 PM  Result Value Ref Range   Troponin i, poc 0.00 0.00 - 0.08 ng/mL   Comment 3            Comment: Due to the release kinetics of cTnI, a negative result within the first hours of the onset of symptoms does not rule out myocardial infarction with certainty. If myocardial infarction is still suspected, repeat the test at appropriate intervals.   Acetaminophen level     Status: Abnormal   Collection Time: 04/02/18 12:40 AM  Result Value Ref Range   Acetaminophen (Tylenol), Serum <10 (L) 10 - 30 ug/mL    Comment:        THERAPEUTIC CONCENTRATIONS VARY SIGNIFICANTLY. A RANGE OF 10-30 ug/mL MAY BE AN EFFECTIVE CONCENTRATION FOR MANY  PATIENTS. HOWEVER, SOME ARE BEST TREATED AT CONCENTRATIONS OUTSIDE THIS RANGE. ACETAMINOPHEN CONCENTRATIONS >150 ug/mL AT 4 HOURS AFTER INGESTION AND >50 ug/mL AT 12 HOURS AFTER INGESTION ARE OFTEN ASSOCIATED WITH TOXIC REACTIONS. Performed at [facility], [place] [address] [place], [place] [address]   Salicylate level     Status: None   Collection Time: 04/02/18 12:40 AM  Result Value Ref Range   Salicylate Lvl <7.9 2.8 - 30.0 mg/dL    Comment: Performed at [facility] [address], [place], [place] [address]  Ethanol     Status: None   Collection Time: 04/02/18 12:40 AM  Result Value Ref Range   Alcohol, Ethyl (B) <10 <10 mg/dL    Comment:        LOWEST DETECTABLE LIMIT FOR SERUM ALCOHOL IS 10 mg/dL FOR MEDICAL PURPOSES ONLY Performed at [facility], [place] [address] [place], [place] [address]   I-stat troponin, ED     Status: None   Collection Time: 04/02/18 12:44 AM  Result Value Ref Range   Troponin i, poc 0.00 0.00 - 0.08 ng/mL   Comment 3            Comment: Due to the release kinetics of cTnI, a negative result within the first hours of the onset of symptoms does not rule out myocardial infarction with certainty. If myocardial infarction is still suspected, repeat the test at appropriate intervals.   Rapid urine drug screen (hospital performed)     Status: Abnormal   Collection Time: 04/02/18 11:37 AM  Result Value Ref Range   Opiates NONE DETECTED NONE DETECTED   Cocaine POSITIVE (A) NONE DETECTED   Benzodiazepines NONE DETECTED NONE DETECTED   Amphetamines NONE DETECTED NONE DETECTED   Tetrahydrocannabinol POSITIVE (A) NONE DETECTED   Barbiturates NONE DETECTED NONE DETECTED    Comment: (NOTE) DRUG SCREEN FOR MEDICAL PURPOSES ONLY.  IF CONFIRMATION IS NEEDED FOR ANY PURPOSE, NOTIFY LAB WITHIN 5 DAYS. LOWEST DETECTABLE LIMITS FOR URINE  DRUG SCREEN Drug Class                     Cutoff (ng/mL) Amphetamine and metabolites     1000 Barbiturate and metabolites    200 Benzodiazepine                 563 Tricyclics and metabolites     300 Opiates and metabolites        300 Cocaine and metabolites        300 THC                            50 Performed at [facility], [place] [address], [place], [place] [address]    Blood Alcohol level:  Lab Results  Component Value Date   ETH <10 04/02/2018   ETH <5 26/37/8588   Metabolic Disorder Labs:  Lab Results  Component Value Date   HGBA1C 5.9 (H) 02/15/2016   MPG 123 (H) 02/15/2016   MPG 123 09/23/2015   Lab Results  Component Value Date   PROLACTIN 14.3 09/23/2015   PROLACTIN 14.1 10/03/2013   Lab Results  Component Value Date   CHOL 163 02/15/2016   TRIG 59 02/15/2016   HDL 95 02/15/2016   CHOLHDL 1.7 02/15/2016   VLDL 12 02/15/2016   LDLCALC 56 02/15/2016   LDLCALC 75 09/23/2015   Current Medications: Current Facility-Administered Medications  Medication Dose Route Frequency Provider Last Rate Last Dose  . acetaminophen (TYLENOL) tablet 650 mg  650 mg Oral Q6H PRN Rankin, Shuvon B, NP   650 mg at 04/02/18 2222  . alum & mag hydroxide-simeth (MAALOX/MYLANTA) 200-200-20 MG/5ML suspension 30 mL  30 mL Oral Q4H PRN Rankin, Shuvon B, NP      . feeding supplement (ENSURE ENLIVE) (ENSURE ENLIVE) liquid 237 mL  237 mL Oral BID BM Rankin, Shuvon B, NP      . lamoTRIgine (LAMICTAL) tablet 50 mg  50 mg Oral QPM Rankin, Shuvon B, NP      . magnesium hydroxide (MILK OF MAGNESIA) suspension 30 mL  30 mL Oral Daily PRN Rankin, Shuvon B, NP      . nicotine polacrilex (NICORETTE) gum 2 mg  2 mg Oral PRN Pennelope Bracken, MD      . QUEtiapine (SEROQUEL) tablet 200 mg  200 mg Oral QHS Nwoko, Agnes I, NP      . QUEtiapine (SEROQUEL) tablet 50 mg  50 mg Oral QHS Nwoko, Agnes I, NP       PTA Medications: Medications Prior to Admission  Medication Sig Dispense Refill Last Dose  . ENSURE PLUS (ENSURE PLUS) LIQD Take 237 mLs by mouth 2 (two) times daily  between meals.   Past Week at Unknown time  . lamoTRIgine (LAMICTAL) 25 MG tablet Take 2 tablets (50 mg total) by mouth every evening. (Patient not taking: Reported on 03/01/2017) 30 tablet 0 Not Taking at Unknown time  . metroNIDAZOLE (FLAGYL) 500 MG tablet Take 1 tablet (500 mg total) by mouth 2 (two) times daily. (Patient not taking: Reported on 04/02/2018) 14 tablet 0 Completed Course at Unknown time  . Multiple Vitamin (MULTIVITAMIN WITH MINERALS) TABS tablet Take 1 tablet by mouth daily.   Past Week at Unknown time  . QUEtiapine (SEROQUEL) 400 MG tablet Take 1 tablet (400 mg total) by mouth at bedtime. 30 tablet 0 Past Week at Unknown time   Musculoskeletal: Strength & Muscle Tone:  within normal limits Gait & Station: normal Patient leans: N/A  Psychiatric Specialty Exam: Physical Exam  Constitutional: She appears well-developed.  HENT:  Head: Normocephalic.  Eyes: Pupils are equal, round, and reactive to light.  Neck: Normal range of motion.  Cardiovascular:  Elevated systolic blood pressure (983/38)  Respiratory: Effort normal.  GI: Soft.  Genitourinary:  Genitourinary Comments: Deferred  Musculoskeletal: Normal range of motion.  Neurological: She is alert.  Skin: Skin is warm.    Review of Systems  Constitutional: Negative.   HENT: Negative.   Eyes: Negative.   Respiratory: Negative.   Cardiovascular: Negative for chest pain, orthopnea, claudication and leg swelling.  Gastrointestinal: Negative for abdominal pain, diarrhea, nausea and vomiting.  Genitourinary: Negative.   Musculoskeletal: Negative.   Skin: Negative.   Neurological: Negative.   Endo/Heme/Allergies: Negative.   Psychiatric/Behavioral: Positive for depression and substance abuse (UDS (+) for Cocaine & THC.). Negative for memory loss. The patient is nervous/anxious and has insomnia.     Blood pressure (!) 114/55, pulse 64, temperature 97.8 F (36.6 C), temperature source Oral, resp. rate 18, height 5'  (1.524 m), weight 47.6 kg (105 lb).Body mass index is 20.51 kg/m.  General Appearance: Casual and Fairly Groomed  Eye Contact:  Good  Speech:  Clear and Coherent and Normal Rate  Volume:  Normal  Mood:  Presents happy facial expressions while endorsing depression.  Affect:  Appropriate  Thought Process:  Coherent, Linear and Descriptions of Associations: Intact  Orientation:  Full (Time, Place, and Person)  Thought Content:  Logical  Suicidal Thoughts:  Admits passive thoughts, denies any plans or intent  Homicidal Thoughts:  Denies  Memory:  Immediate;   Good Recent;   Good Remote;   Good  Judgement:  Good  Insight:  Fair  Psychomotor Activity:  Normal  Concentration:  Concentration: Good and Attention Span: Good  Recall:  Good  Fund of Knowledge:  Good  Language:  Good  Akathisia:  Negative  Handed:  Right  AIMS (if indicated):     Assets:  Communication Skills Desire for Improvement Physical Health  ADL's:  Intact  Cognition:  WNL  Sleep:  Number of Hours: 6.5   Treatment Plan Summary: Daily contact with patient to assess and evaluate symptoms and progress in treatment: See Md's SRA & Treatment plan.  Observation Level/Precautions:  15 minute checks  Laboratory:  Per ED, UDS (+) for Cocaine & THC.  Psychotherapy: group session   Medications: See MAR   Consultations: As needed    Discharge Concerns: Safety, Mood control  Estimated LOS: 5-7 days  Other:  Admit to the 500-hall    Physician Treatment Plan for Primary Diagnosis: Schizoaffective disorder, bipolar type ([place])  Long Term Goal(s): Improvement in symptoms so as ready for discharge  Short Term Goals: Ability to identify changes in lifestyle to reduce recurrence of condition will improve and Ability to demonstrate self-control will improve  Physician Treatment Plan for Secondary Diagnosis: Principal Problem:   Schizoaffective disorder, bipolar type ([place]) Active Problems:   Cocaine use disorder, moderate,  dependence ([place])  Long Term Goal(s): Improvement in symptoms so as ready for discharge  Short Term Goals: Ability to identify and develop effective coping behaviors will improve, Compliance with prescribed medications will improve and Ability to identify triggers associated with substance abuse/mental health issues will improve  I certify that inpatient services furnished can reasonably be expected to improve the patient's condition.    Stephanie Spar, NP, PMHNP, FNP-BC 4/10/201912:54 PM  I have reviewed NP's Note, assessement, diagnosis and plan, and agree. I have also met with patient and completed suicide risk assessment.  [name] is a 52 y/o M with history of schizoaffective disorder bipolar type who was admitted voluntarily from [facility] after she presented with worsening mood symptoms of depression, SI with plan to overdose, and worsening use of crack cocaine.  Upon initial presentation pt shares, "I went on a binge. I relapsed and I was depressed. I was going to kill myself with alcohol and cocaine. I want something different. I want to get back on my meds and I want to go to [facility]." Pt shares that she stopped her previous outpatient medications about 1 week prior to presenting to the ED, and she began to use cocaine and alcohol daily after a previous period of sobriety for about 3 years (though this conflicts with previous admission in 2017 to [facility] with similar complaints). Pt denies any specific stressors in her life. She endorses depressed mood, guilty feelings, poor motivation, anhedonia, decreased appetite, poor concentration, and suicidal thoughts with plan to overdose. She denies HI/AH/VH. She denies symptoms of bipolar mania, OCD, and PTSD. She is vague about her amount of alcohol use over the past week, but she notes using crack cocaine daily for at least the past 5 days. She denies other recent illicit substance use.  Discussed with patient about treatment options. She would  like to be restarted on previous medication of seroquel, and she notes good efficacy with dose of 43m at bedtime and 565mduring the day. Discussed with patient that we will start with 20031mt bedtime to reduce risk of over-sedation, and she was in agreement. She is not interested in referral to substance use treatment at this time, and she plans to [facility].   PLAN OF CARE:   -Admit to inpatient level of care  -Schizoaffective disorder, Bipolar Type             - Start seroquel 83m19m qAM + 200mg70mqhs.   -Anxiety             - Start atarax 83mg 43m6h prn anxiety  -Insomnia              -Start trazodone 83mg p90ms prn insomnia  -Agitation             - Start agitation protocol with zydis/ativan/geodon  -Encourage participation in groups and therapeutic milieu  -Disposition planning will be ongoing   ChristoMaris Berger

## 2018-04-03 NOTE — [facility] Suicide Risk Assessment (Signed)
[name] Admission Suicide Risk Assessment   Nursing information obtained from:  Patient Demographic factors:  NA Current Mental Status:  Self-harm thoughts Loss Factors:  NA Historical Factors:  Family history of suicide, Family history of mental illness or substance abuse, Prior suicide attempts Risk Reduction Factors:  NA  Total Time spent with patient: 1 hour Principal Problem: Schizoaffective disorder, bipolar type (HCC) Diagnosis:   Patient Active Problem List   Diagnosis Date Noted  . Bacterial vaginosis [N76.0, B96.89] 03/01/2017  . Schizoaffective disorder, bipolar type (HCC) [F25.0] 05/18/2016  . Annual physical exam [Z00.00] 02/15/2016  . Encounter for routine gynecological examination [Z01.419] 02/15/2016  . Breast cancer screening [Z12.31] 02/15/2016  . Affective psychosis, bipolar (HCC) [F31.9]   . Cannabis use disorder, moderate, dependence (HCC) [F12.20] 09/22/2015  . Cocaine use disorder, moderate, dependence (HCC) [F14.20] 09/22/2015  . UTI (lower urinary tract infection) [N39.0] 09/04/2012   Subjective Data:   [name] is a 57 y/o M with history of schizoaffective disorder bipolar type who was admitted voluntarily from [facility] after she presented with worsening mood symptoms of depression, SI with plan to overdose, and worsening use of crack cocaine.  Upon initial presentation pt shares, "I went on a binge. I relapsed and I was depressed. I was going to kill myself with alcohol and cocaine. I want something different. I want to get back on my meds and [facility] Pt shares that she stopped her previous outpatient medications about 1 week prior to presenting to the ED, and she began to use cocaine and alcohol daily after a previous period of sobriety for about 3 years (though this conflicts with [facility]). Pt denies any specific stressors in her life. She endorses depressed mood, guilty feelings, poor  motivation, anhedonia, decreased appetite, poor concentration, and suicidal thoughts with plan to overdose. She denies HI/AH/VH. She denies symptoms of bipolar mania, OCD, and PTSD. She is vague about her amount of alcohol use over the past week, but she notes using crack cocaine daily for at least the past 5 days. She denies other recent illicit substance use.  Discussed with patient about treatment options. She would like to be restarted on previous medication of seroquel, and she notes good efficacy with dose of 400mg  at bedtime and 50mg  during the day. Discussed with patient that we will start with 200mg  at bedtime to reduce risk of over-sedation, and she was in agreement. She is not interested in referral to substance use treatment at this time, and she [facility].  Continued Clinical Symptoms:  Alcohol Use Disorder Identification Test Final Score (AUDIT): 2 The "Alcohol Use Disorders Identification Test", Guidelines for Use in Primary Care, Second Edition.  World Science writerHealth Organization [place](WHO). Score between 0-7:  no or low risk or alcohol related problems. Score between 8-15:  moderate risk of alcohol related problems. Score between 16-19:  high risk of alcohol related problems. Score 20 or above:  warrants further diagnostic evaluation for alcohol dependence and treatment.   CLINICAL FACTORS:   Severe Anxiety and/or Agitation Bipolar Disorder:   Depressive phase Alcohol/Substance Abuse/Dependencies Schizophrenia:   Paranoid or undifferentiated type More than one psychiatric diagnosis Unstable or Poor Therapeutic Relationship Previous Psychiatric Diagnoses and Treatments   Musculoskeletal: Strength & Muscle Tone: within normal limits Gait & Station: normal Patient leans: N/A  Psychiatric Specialty Exam: Physical Exam  Nursing note and vitals reviewed.   Review  of Systems  Constitutional: Negative for chills and fever.  Respiratory:  Negative for cough and shortness of breath.   Cardiovascular: Negative for chest pain.  Gastrointestinal: Negative for abdominal pain, heartburn, nausea and vomiting.  Psychiatric/Behavioral: Positive for depression, substance abuse and suicidal ideas. Negative for hallucinations. The patient has insomnia. The patient is not nervous/anxious.     Blood pressure (!) 114/55, pulse 64, temperature 97.8 F (36.6 C), temperature source Oral, resp. rate 18, height 5' (1.524 m), weight 47.6 kg (105 lb).Body mass index is 20.51 kg/m.  General Appearance: Casual and Disheveled  Eye Contact:  Good  Speech:  Clear and Coherent and Normal Rate  Volume:  Normal  Mood:  Euthymic  Affect:  Appropriate, Blunt and Congruent  Thought Process:  Coherent and Goal Directed  Orientation:  Full (Time, Place, and Person)  Thought Content:  Logical  Suicidal Thoughts:  No  Homicidal Thoughts:  No  Memory:  Immediate;   Fair Recent;   Fair Remote;   Fair  Judgement:  Poor  Insight:  Lacking  Psychomotor Activity:  Normal  Concentration:  Concentration: Fair  Recall:  [employer] of Knowledge:  Fair  Language:  Fair  Akathisia:  No  Handed:    AIMS (if indicated):     Assets:  Communication Skills Desire for Improvement Resilience  ADL's:  Intact  Cognition:  WNL  Sleep:  Number of Hours: 6.5      COGNITIVE FEATURES THAT CONTRIBUTE TO RISK:  None    SUICIDE RISK:   Mild:  Suicidal ideation of limited frequency, intensity, duration, and specificity.  There are no identifiable plans, no associated intent, mild dysphoria and related symptoms, good self-control (both objective and subjective assessment), few other risk factors, and identifiable protective factors, including available and accessible social support.  PLAN OF CARE:   -Admit to inpatient level of care  -Schizoaffective disorder, Bipolar Type   - Start seroquel 50mg  po qAM + 200mg  po qhs.   -Anxiety   - Start atarax 50mg  po q6h prn  anxiety  -Insomnia   -Start trazodone 50mg  po qhs prn insomnia  -Agitation   - Start agitation protocol with zydis/ativan/geodon  -Encourage participation in groups and therapeutic milieu  -Disposition planning will be ongoing  I certify that inpatient services furnished can reasonably be expected to improve the patient's condition.   Micheal Likens, MD 04/03/2018, 12:46 PM

## 2018-04-03 NOTE — Progress Notes (Signed)
Recreation Therapy Notes  Date: 4.10.19 Time: 10:00 a.m.  Location: [address] Dayroom    Group Topic: Goal Setting, Positivity    Goal Area(s) Addresses:  Goal 1.1: Patients will identify different skills they use to create a positive life style  Patient will identify at least one goal for a positive lifestyle   Patient will identify the importance of a positive lifestyle   Patient will participate in Recreation Therapy group tx.     Intervention: Arts and Crafts    Activity: For the first part of the activity, patients were asked to try to balance a plastic egg on a flat surface. Patients realized that the egg was not be able to stand on its own without any support. Recreation Therapy Intern then passed out a cup of salt to the patients and ask them to try the task again. Having the patient resort to using salt illustrates that many things are possible but may require some outside-the-box thinking to achieve, just like with goals. After processing with patients, they had the opportunity to decorate their egg using the paint provided.    Education: Goal Setting, Positivity    Education Outcome: Acknowledges Education   Clinical Observations/Feedback: Patient did not attend   [name], Recreation Therapy Intern           [name] 04/03/2018 11:31 AM

## 2018-04-03 NOTE — Progress Notes (Signed)
[name] is a 57 year old female being admitted voluntarily to [facility] from [facility]. She came to the ED for chest pain after binge smoking crack in attempt to kill herself.  She reported increasing depression over the past few weeks.  She reported being off her psych medications as a trigger to this event.  [facility], she was pleasant and cooperative.  She continues to report passive SI and she will contract for safety on the unit. She denies HI or A/V hallucinations.  Oriented her to the unit.  Admission paperwork completed and signed.  Belongings searched and secured in locker # 15, no contraband found.  Skin assessment completed and no skin issues noted.  Q 15 minute checks initiated for safety.  We will continue to monitor the progress towards her goals.

## 2018-04-03 NOTE — [facility] Suicide Risk Assessment (Signed)
[facility] INPATIENT:  Family/Significant Other Suicide Prevention Education  Suicide Prevention Education:  Patient Refusal for Family/Significant Other Suicide Prevention Education: The patient [name] has refused to provide written consent for family/significant other to be provided Family/Significant Other Suicide Prevention Education during admission and/or prior to discharge.  Physician notified.  Metro Kungngel M Laikyn Gewirtz 04/03/2018, 10:48 AM

## 2018-04-03 NOTE — Tx Team (Signed)
Interdisciplinary Treatment and Diagnostic Plan Update  04/03/2018 Time of Session: 10:28 AM  Stephanie GongKimberly [name] MRN: [id number]  Principal Diagnosis: Schizoaffective disorder, bipolar type (HCC)  Secondary Diagnoses: Active Problems:   Schizoaffective disorder, bipolar type (HCC)   Current Medications:  Current Facility-Administered Medications  Medication Dose Route Frequency Provider Last Rate Last Dose  . acetaminophen (TYLENOL) tablet 650 mg  650 mg Oral Q6H PRN Rankin, Shuvon B, NP   650 mg at 04/02/18 2222  . alum & mag hydroxide-simeth (MAALOX/MYLANTA) 200-200-20 MG/5ML suspension 30 mL  30 mL Oral Q4H PRN Rankin, Shuvon B, NP      . feeding supplement (ENSURE ENLIVE) (ENSURE ENLIVE) liquid 237 mL  237 mL Oral BID BM Rankin, Shuvon B, NP      . lamoTRIgine (LAMICTAL) tablet 50 mg  50 mg Oral QPM Rankin, Shuvon B, NP      . magnesium hydroxide (MILK OF MAGNESIA) suspension 30 mL  30 mL Oral Daily PRN Rankin, Shuvon B, NP      . nicotine polacrilex (NICORETTE) gum 2 mg  2 mg Oral PRN Micheal Likensainville, Christopher T, MD      . QUEtiapine (SEROQUEL) tablet 200 mg  200 mg Oral QHS,MR X 1 Simon, Spencer E, PA-C   200 mg at 04/02/18 2223    PTA Medications: Medications Prior to Admission  Medication Sig Dispense Refill Last Dose  . ENSURE PLUS (ENSURE PLUS) LIQD Take 237 mLs by mouth 2 (two) times daily between meals.   Past Week at Unknown time  . lamoTRIgine (LAMICTAL) 25 MG tablet Take 2 tablets (50 mg total) by mouth every evening. (Patient not taking: Reported on 03/01/2017) 30 tablet 0 Not Taking at Unknown time  . metroNIDAZOLE (FLAGYL) 500 MG tablet Take 1 tablet (500 mg total) by mouth 2 (two) times daily. (Patient not taking: Reported on 04/02/2018) 14 tablet 0 Completed Course at Unknown time  . Multiple Vitamin (MULTIVITAMIN WITH MINERALS) TABS tablet Take 1 tablet by mouth daily.   Past Week at Unknown time  . QUEtiapine (SEROQUEL) 400 MG tablet Take 1 tablet (400 mg total) by mouth at  bedtime. 30 tablet 0 Past Week at Unknown time    Treatment Modalities: Medication Management, Group therapy, Case management,  1 to 1 session with clinician, Psychoeducation, Recreational therapy.  Patient Stressors: Medication change or noncompliance Substance abuse  Patient Strengths: Capable of independent living Wellsite geologistCommunication skills General fund of knowledge Motivation for treatment/growth Physical Health   Physician Treatment Plan for Primary Diagnosis: Schizoaffective disorder, bipolar type (HCC) Long Term Goal(s): Improvement in symptoms so as ready for discharge  Short Term Goals: Ability to identify changes in lifestyle to reduce recurrence of condition will improve Ability to demonstrate self-control will improve Ability to identify and develop effective coping behaviors will improve Compliance with prescribed medications will improve Ability to identify triggers associated with substance abuse/mental health issues will improve  Medication Management: Evaluate patient's response, side effects, and tolerance of medication regimen.  Therapeutic Interventions: 1 to 1 sessions, Unit Group sessions and Medication administration.  Evaluation of Outcomes: Progressing  Physician Treatment Plan for Secondary Diagnosis: Active Problems:   Schizoaffective disorder, bipolar type (HCC)  Long Term Goal(s): Improvement in symptoms so as ready for discharge  Short Term Goals: Ability to identify changes in lifestyle to reduce recurrence of condition will improve Ability to demonstrate self-control will improve Ability to identify and develop effective coping behaviors will improve Compliance with prescribed medications will improve Ability to identify triggers associated  with substance abuse/mental health issues will improve  Medication Management: Evaluate patient's response, side effects, and tolerance of medication regimen.  Therapeutic Interventions: 1 to 1 sessions, Unit  Group sessions and Medication administration.  Evaluation of Outcomes: Progressing   RN Treatment Plan for Primary Diagnosis: Schizoaffective disorder, bipolar type (HCC) Long Term Goal(s): Knowledge of disease and therapeutic regimen to maintain health will improve  Short Term Goals: Ability to remain free from injury will improve, Ability to disclose and discuss suicidal ideas, Ability to identify and develop effective coping behaviors will improve and Compliance with prescribed medications will improve  Medication Management: RN will administer medications as ordered by provider, will assess and evaluate patient's response and provide education to patient for prescribed medication. RN will report any adverse and/or side effects to prescribing provider.  Therapeutic Interventions: 1 on 1 counseling sessions, Psychoeducation, Medication administration, Evaluate responses to treatment, Monitor vital signs and CBGs as ordered, Perform/monitor CIWA, COWS, AIMS and Fall Risk screenings as ordered, Perform wound care treatments as ordered.  Evaluation of Outcomes: Progressing   LCSW Treatment Plan for Primary Diagnosis: Schizoaffective disorder, bipolar type (HCC) Long Term Goal(s): Safe transition to appropriate next level of care at discharge, Engage patient in therapeutic group addressing interpersonal concerns.  Short Term Goals: Engage patient in aftercare planning with referrals and resources, Facilitate acceptance of mental health diagnosis and concerns, Facilitate patient progression through stages of change regarding substance use diagnoses and concerns, Identify triggers associated with mental health/substance abuse issues and Increase skills for wellness and recovery  Therapeutic Interventions: Assess for all discharge needs, 1 to 1 time with Social worker, Explore available resources and support systems, Assess for adequacy in community support network, Educate family and significant  other(s) on suicide prevention, Complete Psychosocial Assessment, Interpersonal group therapy.  Evaluation of Outcomes: Progressing   Progress in Treatment: Attending groups: Yes Participating in groups: Yes Taking medication as prescribed: Yes Toleration of medication: Yes, no side effects reported at this time Family/Significant other contact made: No  Patient understands diagnosis: Yes AEB asking for help with medications  Discussing patient identified problems/goals with staff: Yes Medical problems stabilized or resolved: Yes Denies suicidal/homicidal ideation: Yes Issues/concerns per patient self-inventory: None Other: N/A  New problem(s) identified: None identified at this time.   New Short Term/Long Term Goal(s): "Get back on my medications, quit using again, and to go home".   Discharge Plan or Barriers:  Upon discharge pt will return to her home and follow up with [facility] in [place].  Reason for Continuation of Hospitalization:  Depression Medication stabilization Suicidal ideation   Estimated Length of Stay: 04/08/18  Attendees: Patient: [name]  04/03/2018  10:28 AM  Physician: Jolyne Loa, MD 04/03/2018  10:28 AM  Nursing: Estella Husk, RN 04/03/2018  10:28 AM  RN Care Manager: Onnie Boer, RN 04/03/2018  10:28 AM  Social Worker: Richelle Ito, LCSW; Melba Coon, Social Work Intern 04/03/2018  10:28 AM  Recreational Therapist: Caroll Rancher, LRT 04/03/2018  10:28 AM  Other: Tomasita Morrow, P4CC 04/03/2018  10:28 AM  Other:  04/03/2018  10:28 AM  Other: 04/03/2018  10:28 AM    Scribe for Treatment Team: Aram Beecham, Student-Social Work 04/03/2018 10:28 AM

## 2018-04-03 NOTE — [facility] Group Notes (Signed)
LCSW Group Therapy Note   04/03/2018 1:15pm   Type of Therapy and Topic:  Group Therapy:  Trust and Honesty  Participation Level:  Did Not Attend  Description of Group:    In this group patients will be asked to explore the value of being honest.  Patients will be guided to discuss their thoughts, feelings, and behaviors related to honesty and trusting in others. Patients will process together how trust and honesty relate to forming relationships with peers, family members, and self. Each patient will be challenged to identify and express feelings of being vulnerable. Patients will discuss reasons why people are dishonest and identify alternative outcomes if one was truthful (to self or others). This group will be process-oriented, with patients participating in exploration of their own experiences, giving and receiving support, and processing challenge from other group members.   Therapeutic Goals: 1. Patient will identify why honesty is important to relationships and how honesty overall affects relationships.  2. Patient will identify a situation where they lied or were lied too and the  feelings, thought process, and behaviors surrounding the situation 3. Patient will identify the meaning of being vulnerable, how that feels, and how that correlates to being honest with self and others. 4. Patient will identify situations where they could have told the truth, but instead lied and explain reasons of dishonesty.   Summary of Patient Progress    Therapeutic Modalities:   Cognitive Behavioral Therapy Solution Focused Therapy Motivational Interviewing Brief Therapy  Ida RogueRodney B Xyon Lukasik, LCSW 04/03/2018 12:28 PM

## 2018-04-04 MED ORDER — ADULT MULTIVITAMIN W/MINERALS CH
1.0000 | ORAL_TABLET | Freq: Every day | ORAL | Status: DC
  Administered 2018-04-05 – 2018-04-08 (×4): 1 via ORAL
  Filled 2018-04-04 (×6): qty 1

## 2018-04-04 NOTE — Progress Notes (Signed)
Recreation Therapy Notes  INPATIENT RECREATION THERAPY ASSESSMENT  Patient Details Name: [name] MRN: [id number] DOB: 1961/11/10 Today's Date: 04/04/2018       Information Obtained From: Patient  Able to Participate in Assessment/Interview: Yes  Patient Presentation: Responsive  Reason for Admission (Per Patient): Suicide Attempt, Substance Abuse  Patient Stressors: "Life"  Coping Skills:   Substance Abuse, TV  Leisure Interests (2+):  Individual - TV  Frequency of Recreation/Participation: Weekly  Awareness of Community Resources:  Yes  Community Resources:  Research scientist (physical sciences)Movie Theaters  Current Use: No  If no, Barriers?: Social  Expressed Interest in [employer] CorporationCommunity Resource Information: No  County of Residence:  [place]   Patient Main Form of Transportation: Therapist, musicublic Transportation  Patient Strengths:  Animal nutritionist"Baby siting, reading"   Patient Identified Areas of Improvement:  "Taking medications"  Patient Goal for Hospitalization:  "Taking medications"   Current SI (including self-harm):  No  Current HI:  No  Current AVH: No  Staff Intervention Plan: Group Attendance, Collaborate with Interdisciplinary Treatment Team  Consent to Intern Participation: Yes  Sheryle Hailarian Falynn Ailey, Recreation Therapy Intern   Sheryle HailDarian Haliegh Khurana 04/04/2018, 2:10 PM

## 2018-04-04 NOTE — Progress Notes (Signed)
Patient ID: [name], female   DOB: 30-Jul-1961, 57 y.o.   MRN: [id number]  D: Patient in room resting on approach. Pt reports she is doing well getting back on her medications. Pt reports she is tolerating medication well. Pt thought process is organized and behavior is appropriate. Pt denies SI/HI/AVH and pain. Cooperative with assessment. No acute distressed noted at this time.   A: Medications administered as prescribed. Support and encouragement provided to attend groups and engage in milieu. Pt encouraged to discuss feelings and come to staff with any question or concerns.   R: Patient remains safe and complaint with medications.

## 2018-04-04 NOTE — Progress Notes (Signed)
Recreation Therapy Notes  Date: 4.11.19 Time: 10:00 a.m.  Location: [address] Dayroom   Group Topic: Self-Awareness   Goal Area(s) Addresses:  Goal 1.1: To increase self-awareness   - Patient will identify the importance of self-awareness  - Patient will identify at least three things of how others view them  - Patient will identify at least three things of how they view themselves   Intervention: Craft   Activity: Patients were instructed to decorate the outside of their bag using the magazines provided to represent how other people see them and put things inside that represent how they really are on the inside. At the end of the session, patients shared their bags with one another.  Education: Chief Executive Officerelf-Awareness   Education Outcome:Acknowledges Education  Clinical Observations/Feedback: Patient did not attend   Sheryle HailDarian Alexandr Yaworski, Recreation Therapy Intern   Sheryle HailDarian Naseem Adler 04/04/2018 12:53 PM

## 2018-04-04 NOTE — Progress Notes (Signed)
Psychoeducational Group Note  Date:  04/04/2018 Time:  2044  Group Topic/Focus:  Wrap-Up Group:   The focus of this group is to help patients review their daily goal of treatment and discuss progress on daily workbooks.  Participation Level: Did Not Attend  Participation Quality:  Not Applicable  Affect:  Not Applicable  Cognitive:  Not Applicable  Insight:  Not Applicable  Engagement in Group: Not Applicable  Additional Comments:  The patient did not attend group this evening.   Hazle CocaGOODMAN, Damoni Erker S 04/04/2018, 8:44 PM

## 2018-04-04 NOTE — [facility] Group Notes (Signed)
[facility] LCSW Group Therapy 04/04/2018 1:15pm  Type of Therapy: Group Therapy- Feelings Around Discharge & Establishing a Supportive Framework  Participation Level:  Did Not Attend  Description of Group:   What is a supportive framework? What does it look like feel like and how do I discern it from and unhealthy non-supportive network? Learn how to cope when supports are not helpful and don't support you. Discuss what to do when your family/friends are not supportive.  Summary of Patient Progress   Therapeutic Modalities:   Cognitive Behavioral Therapy Person-Centered Therapy Motivational Interviewing   Carlynn Heraldngel M Sumire Halbleib, Student-Social Work 04/04/2018 2:39 PM

## 2018-04-04 NOTE — Progress Notes (Signed)
[facility] MD Progress Note  04/04/2018 3:20 PM [name]  MRN:  [id number]  Subjective: [name] reports, "My mood is getting better. I'm not feeling suicidal as I was prior to coming to the hospital. I'm still having the thoughts, but no plans. If I'm not here in the hospital getting help, I will cutting my wrist right. I still feel jittery & anxious, but, I'm getting there. I'm mad at myself for spending $700.00 on crack in just 5 days. I need some multivitamins. I also need an STD test done. While high, I slept with one of my friends. I don't know what he's got. We were both high, not in our right minds at the time. I have been up today, had my bath already, put on clean clothes. I will be attending the afternoon group meeting".  [name] is a 57 y/o M with history of schizoaffective disorder bipolar type who was admitted voluntarily from [facility] after she presented with worsening mood symptoms of depression, SI with plan to overdose, and worsening use of crack cocaine. Upon initial presentation pt shares, "I went on a binge. I relapsed and I was depressed. I was going to kill myself with alcohol and cocaine. I want something different. I want to get back on my meds and I want to go to [facility]." Pt shares that she stopped her previous outpatient medications about 1 week prior to presenting to the ED, and she began to use cocaine and alcohol daily after a previous period of sobriety for about 3 years.   Principal Problem: Schizoaffective disorder, bipolar type (HCC)  Diagnosis:   Patient Active Problem List   Diagnosis Date Noted  . Schizoaffective disorder, bipolar type (HCC) [F25.0] 05/18/2016    Priority: High  . Cocaine use disorder, moderate, dependence (HCC) [F14.20] 09/22/2015    Priority: Medium  . Bacterial vaginosis [N76.0, B96.89] 03/01/2017  . Annual physical exam [Z00.00] 02/15/2016  . Encounter for routine gynecological examination [Z01.419] 02/15/2016  . Breast cancer  screening [Z12.31] 02/15/2016  . Affective psychosis, bipolar (HCC) [F31.9]   . Cannabis use disorder, moderate, dependence (HCC) [F12.20] 09/22/2015  . UTI (lower urinary tract infection) [N39.0] 09/04/2012   Total Time spent with patient: 25 minutes  Past Psychiatric History: See H&P  Past Medical History:  Past Medical History:  Diagnosis Date  . Bipolar 1 disorder (HCC)   . Depression   . Drug abuse (HCC)   . Schizophrenia [facility])     Past Surgical History:  Procedure Laterality Date  . NO PAST SURGERIES     Family History:  Family History  Problem Relation Age of Onset  . Mental illness Maternal Aunt   . Cancer Maternal Aunt        breast   . Mental illness Paternal Aunt   . Anesthesia problems Neg Hx   . Hypotension Neg Hx   . Malignant hyperthermia Neg Hx   . Pseudochol deficiency Neg Hx    Family Psychiatric  History: See H&P  Social History:  Social History   Substance and Sexual Activity  Alcohol Use Yes  . Alcohol/week: 1.8 oz  . Types: 3 Cans of beer per week   Comment: once a week a 40 oz.      Social History   Substance and Sexual Activity  Drug Use No  . Types: Marijuana, Cocaine   Comment: occasionally    Social History   Socioeconomic History  . Marital status: Single    Spouse name: Not on  file  . Number of children: Not on file  . Years of education: Not on file  . Highest education level: Not on file  Occupational History  . Not on file  Social Needs  . Financial resource strain: Not on file  . Food insecurity:    Worry: Not on file    Inability: Not on file  . Transportation needs:    Medical: Not on file    Non-medical: Not on file  Tobacco Use  . Smoking status: Current Every Day Smoker    Packs/day: 0.25    Years: 10.00    Pack years: 2.50    Types: Cigarettes, Cigars  . Smokeless tobacco: Never Used  Substance and Sexual Activity  . Alcohol use: Yes    Alcohol/week: 1.8 oz    Types: 3 Cans of beer per week     Comment: once a week a 40 oz.   . Drug use: No    Types: Marijuana, Cocaine    Comment: occasionally  . Sexual activity: Yes    Birth control/protection: Condom  Lifestyle  . Physical activity:    Days per week: Not on file    Minutes per session: Not on file  . Stress: Not on file  Relationships  . Social connections:    Talks on phone: Not on file    Gets together: Not on file    Attends religious service: Not on file    Active member of club or organization: Not on file    Attends meetings of clubs or organizations: Not on file    Relationship status: Not on file  Other Topics Concern  . Not on file  Social History Narrative  . Not on file   Additional Social History:   Sleep: Good  Appetite:  Good  Current Medications: Current Facility-Administered Medications  Medication Dose Route Frequency Provider Last Rate Last Dose  . acetaminophen (TYLENOL) tablet 650 mg  650 mg Oral Q6H PRN Rankin, Shuvon B, NP   650 mg at 04/02/18 2222  . alum & mag hydroxide-simeth (MAALOX/MYLANTA) 200-200-20 MG/5ML suspension 30 mL  30 mL Oral Q4H PRN Rankin, Shuvon B, NP      . feeding supplement (ENSURE ENLIVE) (ENSURE ENLIVE) liquid 237 mL  237 mL Oral BID BM Rankin, Shuvon B, NP   237 mL at 04/04/18 0952  . hydrOXYzine (ATARAX/VISTARIL) tablet 50 mg  50 mg Oral Q6H PRN Micheal Likens, MD      . OLANZapine zydis (ZYPREXA) disintegrating tablet 10 mg  10 mg Oral Q8H PRN Micheal Likens, MD       And  . LORazepam (ATIVAN) tablet 1 mg  1 mg Oral PRN Micheal Likens, MD       And  . ziprasidone (GEODON) injection 20 mg  20 mg Intramuscular PRN Micheal Likens, MD      . magnesium hydroxide (MILK OF MAGNESIA) suspension 30 mL  30 mL Oral Daily PRN Rankin, Shuvon B, NP      . nicotine polacrilex (NICORETTE) gum 2 mg  2 mg Oral PRN Micheal Likens, MD      . QUEtiapine (SEROQUEL) tablet 200 mg  200 mg Oral QHS Nwoko, Agnes I, NP      . QUEtiapine  (SEROQUEL) tablet 50 mg  50 mg Oral QHS Nwoko, Nicole Kindred I, NP   250 mg at 04/03/18 2135  . traZODone (DESYREL) tablet 50 mg  50 mg Oral QHS PRN Micheal Likens, MD  Lab Results: No results found for this or any previous visit (from the past 48 hour(s)).  Blood Alcohol level:  Lab Results  Component Value Date   ETH <10 04/02/2018   ETH <5 09/03/2016    Metabolic Disorder Labs: Lab Results  Component Value Date   HGBA1C 5.9 (H) 02/15/2016   MPG 123 (H) 02/15/2016   MPG 123 09/23/2015   Lab Results  Component Value Date   PROLACTIN 14.3 09/23/2015   PROLACTIN 14.1 10/03/2013   Lab Results  Component Value Date   CHOL 163 02/15/2016   TRIG 59 02/15/2016   HDL 95 02/15/2016   CHOLHDL 1.7 02/15/2016   VLDL 12 02/15/2016   LDLCALC 56 02/15/2016   LDLCALC 75 09/23/2015    Physical Findings: AIMS: Facial and Oral Movements Muscles of Facial Expression: None, normal Lips and Perioral Area: None, normal Jaw: None, normal Tongue: None, normal,Extremity Movements Upper (arms, wrists, hands, fingers): None, normal Lower (legs, knees, ankles, toes): None, normal, Trunk Movements Neck, shoulders, hips: None, normal, Overall Severity Severity of abnormal movements (highest score from questions above): None, normal Incapacitation due to abnormal movements: None, normal Patient's awareness of abnormal movements (rate only patient's report): No Awareness, Dental Status Current problems with teeth and/or dentures?: No Does patient usually wear dentures?: No  CIWA:    COWS:     Musculoskeletal: Strength & Muscle Tone: within normal limits Gait & Station: normal Patient leans: N/A  Psychiatric Specialty Exam: Physical Exam  Nursing note and vitals reviewed.   Review of Systems  Psychiatric/Behavioral: Positive for depression ("Improving") and substance abuse (Hx. Cocaine/Alcoholism, chronic). Negative for hallucinations, memory loss and suicidal ideas. The patient  is not nervous/anxious and does not have insomnia.     Blood pressure (!) 114/55, pulse 64, temperature 97.8 F (36.6 C), temperature source Oral, resp. rate 18, height 5' (1.524 m), weight 47.6 kg (105 lb).Body mass index is 20.51 kg/m.  General Appearance: Casual and Disheveled  Eye Contact:  Good  Speech:  Clear and Coherent and Normal Rate  Volume:  Normal  Mood:  Euthymic  Affect:  Appropriate, Blunt and Congruent  Thought Process:  Coherent and Goal Directed  Orientation:  Full (Time, Place, and Person)  Thought Content:  Logical  Suicidal Thoughts:  No  Homicidal Thoughts:  No  Memory:  Immediate;   Fair Recent;   Fair Remote;   Fair  Judgement:  Poor  Insight:  Lacking  Psychomotor Activity:  Normal  Concentration:  Concentration: Fair  Recall:  [employer]  Fund of Knowledge:  Fair  Language:  Fair  Akathisia:  No  Handed:    AIMS (if indicated):     Assets:  Communication Skills Desire for Improvement Resilience  ADL's:  Intact  Cognition:  WNL  Sleep:  Number of Hours: 6.5     Treatment Plan Summary: Daily contact with patient to assess and evaluate symptoms and progress in treatment.  - Continue inpatient hospitalization.  - Will continue today 04/04/2018 plan as below except where it is noted.  Mood control. - Continue 200 mg po Q hs. - Continue Seroquel 50 mg po q daily (morning).  Anxiety. - Continue Vistaril 50 mg po Q 6 hours prn.  Agitation/psychosis. - Continue the agitation protocols as recommended using Olanzapine Zydis 10 mg po o Q 8 hours prn.  Insomnia. - Continue Trazodone 50 mg po prn Q hs.  - Patient to continue to attend & participate in the group sessions. - Discharge disposition  on going.  Armandina Stammer, NP, PMHNP, FNP-BC 04/04/2018, 3:20 PM

## 2018-04-04 NOTE — Progress Notes (Signed)
Patient denies SI, Hi and AVH.  Patient has been compliant with her medications and has tolerated them well.  Patient has attended groups and engaged in unit activities.  Patient has been focused on getting her medications straightened out before she leaves the hospital.   Assess patient for safety, offer medications as prescribed, engage patient in 1:1 staff talks.   Patient able to contract for safety.  Continue to monitor as planned.

## 2018-04-05 MED ORDER — QUETIAPINE FUMARATE 50 MG PO TABS
50.0000 mg | ORAL_TABLET | ORAL | Status: DC
  Administered 2018-04-06 – 2018-04-08 (×3): 50 mg via ORAL
  Filled 2018-04-05 (×4): qty 1

## 2018-04-05 MED ORDER — QUETIAPINE FUMARATE 300 MG PO TABS
300.0000 mg | ORAL_TABLET | Freq: Every day | ORAL | Status: DC
  Administered 2018-04-05 – 2018-04-06 (×2): 300 mg via ORAL
  Filled 2018-04-05 (×5): qty 1

## 2018-04-05 NOTE — Progress Notes (Signed)
D: Pt denies SI/HI/AVH. Pt is pleasant and cooperative. Pt visible in the dayroom , pt interacting with peers appropriately.   A: Pt was offered support and encouragement. Pt was given scheduled medications. Pt was encourage to attend groups. Q 15 minute checks were done for safety.   R:Pt attends groups and interacts well with peers and staff. Pt is taking medication. Pt has no complaints.Pt receptive to treatment and safety maintained on unit.

## 2018-04-05 NOTE — Plan of Care (Signed)
  Problem: Safety: Goal: Periods of time without injury will increase Outcome: Progressing   

## 2018-04-05 NOTE — B[facility] Group Notes (Signed)
LCSW Group Therapy Note   04/05/2018 1:15pm   Type of Therapy and Topic:  Group Therapy:  Positive Affirmations   Participation Level:  Active  Description of Group: This group addressed positive affirmation toward self and others. Patients went around the room and identified two positive things about themselves and two positive things about a peer in the room. Patients reflected on how it felt to share something positive with others, to identify positive things about themselves, and to hear positive things from others. Patients were encouraged to have a daily reflection of positive characteristics or circumstances.  Therapeutic Goals 1. Patient will verbalize two of their positive qualities 2. Patient will demonstrate empathy for others by stating two positive qualities about a peer in the group 3. Patient will verbalize their feelings when voicing positive self affirmations and when voicing positive affirmations of others 4. Patients will discuss the potential positive impact on their wellness/recovery of focusing on positive traits of self and others. Summary of Patient Progress:  [name] came to group and remained there the entire time.  She states that she has lost hope in the past but has been able to find it while being here in the hospital.  She also shared that she does not feel like she can trust people.  Her idea of hope is having friends who she can confide in and share things with.  She would like to get back on track and continue working with [employer].  The picture she chose was a bird because she wants to be able to be at peace and fly away like the bird does.    Therapeutic Modalities Cognitive Behavioral Therapy Motivational Interviewing  [name], Student-Social Work 04/05/2018 1:02 PM

## 2018-04-05 NOTE — Progress Notes (Signed)
[facility] MD Progress Note  04/05/2018 12:35 PM [name]  MRN:  [id number] Subjective:    [name] is a 57 y/o M with history of schizoaffective disorder bipolar type who was admitted voluntarily from [facility] after she presented with worsening mood symptoms of depression, AH, SI with plan to overdose, and worsening use of crack cocaine. She was restarted on previous medication regimen of seroquel. She has been reporting incremental improvement of her presenting symptoms.  Today upon evaluation, pt shares, "I'm doing better. I'm getting up and feeling better." She continues to endorse some depression which is improving and SI without plan or intent. She comments regarding SI, "It on and off, but everything is getting a little better." She denies HI. She endorses VH of "seeing people in the wall." She denies AH. She notes her sleep has improved but she still has difficulty remaining asleep at bedtime. She denies physical complaints. She feels that seroquel has been helpful for her, but she previously has best response with dose of at least 400mg  at bedtime. Pt agrees to increase evening dose of seroquel tonight. She has no further questions, comments, or concerns.  Principal Problem: Schizoaffective disorder, bipolar type (HCC) Diagnosis:   Patient Active Problem List   Diagnosis Date Noted  . Bacterial vaginosis [N76.0, B96.89] 03/01/2017  . Schizoaffective disorder, bipolar type (HCC) [F25.0] 05/18/2016  . Annual physical exam [Z00.00] 02/15/2016  . Encounter for routine gynecological examination [Z01.419] 02/15/2016  . Breast cancer screening [Z12.31] 02/15/2016  . Affective psychosis, bipolar (HCC) [F31.9]   . Cannabis use disorder, moderate, dependence (HCC) [F12.20] 09/22/2015  . Cocaine use disorder, moderate, dependence (HCC) [F14.20] 09/22/2015  . UTI (lower urinary tract infection) [N39.0] 09/04/2012   Total Time spent with patient: 30 minutes  Past Psychiatric History: see  H&P  Past Medical History:  Past Medical History:  Diagnosis Date  . Bipolar 1 disorder (HCC)   . Depression   . Drug abuse (HCC)   . Schizophrenia [facility])     Past Surgical History:  Procedure Laterality Date  . NO PAST SURGERIES     Family History:  Family History  Problem Relation Age of Onset  . Mental illness Maternal Aunt   . Cancer Maternal Aunt        breast   . Mental illness Paternal Aunt   . Anesthesia problems Neg Hx   . Hypotension Neg Hx   . Malignant hyperthermia Neg Hx   . Pseudochol deficiency Neg Hx    Family Psychiatric  History: see H&P Social History:  Social History   Substance and Sexual Activity  Alcohol Use Yes  . Alcohol/week: 1.8 oz  . Types: 3 Cans of beer per week   Comment: once a week a 40 oz.      Social History   Substance and Sexual Activity  Drug Use No  . Types: Marijuana, Cocaine   Comment: occasionally    Social History   Socioeconomic History  . Marital status: Single    Spouse name: Not on file  . Number of children: Not on file  . Years of education: Not on file  . Highest education level: Not on file  Occupational History  . Not on file  Social Needs  . Financial resource strain: Not on file  . Food insecurity:    Worry: Not on file    Inability: Not on file  . Transportation needs:    Medical: Not on file    Non-medical: Not on file  Tobacco Use  . Smoking status: Current Every Day Smoker    Packs/day: 0.25    Years: 10.00    Pack years: 2.50    Types: Cigarettes, Cigars  . Smokeless tobacco: Never Used  Substance and Sexual Activity  . Alcohol use: Yes    Alcohol/week: 1.8 oz    Types: 3 Cans of beer per week    Comment: once a week a 40 oz.   . Drug use: No    Types: Marijuana, Cocaine    Comment: occasionally  . Sexual activity: Yes    Birth control/protection: Condom  Lifestyle  . Physical activity:    Days per week: Not on file    Minutes per session: Not on file  . Stress: Not on file   Relationships  . Social connections:    Talks on phone: Not on file    Gets together: Not on file    Attends religious service: Not on file    Active member of club or organization: Not on file    Attends meetings of clubs or organizations: Not on file    Relationship status: Not on file  Other Topics Concern  . Not on file  Social History Narrative  . Not on file   Additional Social History:                         Sleep: Fair  Appetite:  Good  Current Medications: Current Facility-Administered Medications  Medication Dose Route Frequency Provider Last Rate Last Dose  . acetaminophen (TYLENOL) tablet 650 mg  650 mg Oral Q6H PRN Rankin, Shuvon B, NP   650 mg at 04/04/18 2118  . alum & mag hydroxide-simeth (MAALOX/MYLANTA) 200-200-20 MG/5ML suspension 30 mL  30 mL Oral Q4H PRN Rankin, Shuvon B, NP      . feeding supplement (ENSURE ENLIVE) (ENSURE ENLIVE) liquid 237 mL  237 mL Oral BID BM Rankin, Shuvon B, NP   237 mL at 04/04/18 0952  . hydrOXYzine (ATARAX/VISTARIL) tablet 50 mg  50 mg Oral Q6H PRN Micheal Likensain[place], Emmalynne Courtney T, MD      . OLANZapine zydis (ZYPREXA) disintegrating tablet 10 mg  10 mg Oral Q8H PRN Micheal Lik[place], Xzaviar Maloof T, MD       And  . LORazepam (ATIVAN) tablet 1 mg  1 mg Oral PRN Micheal Likens[place], Lamaj Metoyer T, MD       And  . ziprasidone (GEODON) injection 20 mg  20 mg Intramuscular PRN Micheal Likens[place], Cameran Ahmed T, MD      . magnesium hydroxide (MILK OF MAGNESIA) suspension 30 mL  30 mL Oral Daily PRN Rankin, Shuvon B, NP      . multivitamin with minerals tablet 1 tablet  1 tablet Oral Daily Armandina StammerNwoko, Agnes I, NP   1 tablet at 04/05/18 0724  . nicotine polacrilex (NICORETTE) gum 2 mg  2 mg Oral PRN Micheal Likens[place], Keymon Mcelroy T, MD   2 mg at 04/04/18 2118  . QUEtiapine (SEROQUEL) tablet 300 mg  300 mg Oral QHS Micheal Likens[place], Matai Carpenito T, MD      . Melene Muller[START ON 04/06/2018] QUEtiapine (SEROQUEL) tablet 50 mg  50 mg Oral BH-q7a Micheal Lik[place], Jadesola Poynter T, MD      . traZODone  (DESYREL) tablet 50 mg  50 mg Oral QHS PRN Micheal Likens[place], Aaylah Pokorny T, MD        Lab Results: No results found for this or any previous visit (from the past 48 hour(s)).  Blood Alcohol level:  Lab Results  Component Value  Date   ETH <10 04/02/2018   ETH <5 09/03/2016    Metabolic Disorder Labs: Lab Results  Component Value Date   HGBA1C 5.9 (H) 02/15/2016   MPG 123 (H) 02/15/2016   MPG 123 09/23/2015   Lab Results  Component Value Date   PROLACTIN 14.3 09/23/2015   PROLACTIN 14.1 10/03/2013   Lab Results  Component Value Date   CHOL 163 02/15/2016   TRIG 59 02/15/2016   HDL 95 02/15/2016   CHOLHDL 1.7 02/15/2016   VLDL 12 02/15/2016   LDLCALC 56 02/15/2016   LDLCALC 75 09/23/2015    Physical Findings: AIMS: Facial and Oral Movements Muscles of Facial Expression: None, normal Lips and Perioral Area: None, normal Jaw: None, normal Tongue: None, normal,Extremity Movements Upper (arms, wrists, hands, fingers): None, normal Lower (legs, knees, ankles, toes): None, normal, Trunk Movements Neck, shoulders, hips: None, normal, Overall Severity Severity of abnormal movements (highest score from questions above): None, normal Incapacitation due to abnormal movements: None, normal Patient's awareness of abnormal movements (rate only patient's report): No Awareness, Dental Status Current problems with teeth and/or dentures?: No Does patient usually wear dentures?: No  CIWA:    COWS:     Musculoskeletal: Strength & Muscle Tone: within normal limits Gait & Station: normal Patient leans: N/A  Psychiatric Specialty Exam: Physical Exam  Nursing note and vitals reviewed.   Review of Systems  Constitutional: Negative for chills and fever.  Respiratory: Negative for cough and shortness of breath.   Cardiovascular: Negative for chest pain.  Gastrointestinal: Negative for abdominal pain, heartburn, nausea and vomiting.  Psychiatric/Behavioral: Positive for depression,  hallucinations and suicidal ideas. The patient has insomnia. The patient is not nervous/anxious.     Blood pressure (!) 114/55, pulse 64, temperature 97.8 F (36.6 C), temperature source Oral, resp. rate 18, height 5' (1.524 m), weight 47.6 kg (105 lb).Body mass index is 20.51 kg/m.  General Appearance: Casual and Disheveled  Eye Contact:  Good  Speech:  Clear and Coherent and Normal Rate  Volume:  Normal  Mood:  Depressed  Affect:  Appropriate, Congruent and Flat  Thought Process:  Coherent and Goal Directed  Orientation:  Full (Time, Place, and Person)  Thought Content:  Hallucinations: Visual  Suicidal Thoughts:  Yes.  without intent/plan  Homicidal Thoughts:  No  Memory:  Immediate;   Fair Recent;   Fair Remote;   Fair  Judgement:  Fair  Insight:  Shallow  Psychomotor Activity:  Normal  Concentration:  Concentration: Fair  Recall:  [employer] of Knowledge:  Fair  Language:  Fair  Akathisia:  No  Handed:    AIMS (if indicated):     Assets:  Communication Skills Resilience Social Support  ADL's:  Intact  Cognition:  WNL  Sleep:  Number of Hours: 6   Treatment Plan Summary: Daily contact with patient to assess and evaluate symptoms and progress in treatment and Medication management   -Continue inpatient hospitalization  -Schizoaffective disorder, Bipolar Type             - Continue seroquel 50mg  po qAM   -Change seroquel 200mg  po qhs to seroquel 300mg  po qhs.   -Anxiety             - Continue atarax 50mg  po q6h prn anxiety  -Insomnia              -Continue trazodone 50mg  po qhs prn insomnia  -Agitation             -  Continue agitation protocol with zydis/ativan/geodon  -Encourage participation in groups and therapeutic milieu  -Disposition planning will be ongoing  Micheal Likens, MD 04/05/2018, 12:35 PM

## 2018-04-05 NOTE — Progress Notes (Addendum)
Recreation Therapy Notes  Date: 4.12.19 Time: 10 a.m. Location: [address] Dayroom   Group Topic: Stress Management, Forgiveness    Goal Area(s) Addresses:  Goal 1.1: To reduce stress  -Patient will identify how forgiveness can be beneficial  -Patient will identify the importance of stress management  -Patient will participate during Recreation Therapy group tx.    Behavioral Response: Engaged   Intervention: Stress Management   Activity: Meditation- Patients were in a peaceful environment with soft lighting enhancing patients mood. Patients listened to a forgiveness meditation on the calm app to help decrease stress levels   Education: Stress Management, Discharge Planning.    Education Outcome: Acknowledges edcuation/In group clarification offered/Needs additional education   Clinical Observations/Feedback:: Patient arrived to Recreation Therapy group tx. Late. Upon arrival patient participated appropriately during Recreation Therapy group treatment with a calm mood. Patient actively listened during closing discussion. Patient successfully Goal 1.1 (See Above)    Sheryle Hailarian Shemeca Lukasik, Recreation Therapy Intern   Sheryle HailDarian Chinara Hertzberg 04/05/2018 11:13 AM

## 2018-04-05 NOTE — Progress Notes (Signed)
Patient denies SI, Hi and AVH.  Patient has been compliant with her medications and has tolerated them well.  Patient has attended groups and engaged in unit activities.  Patient has been focused on getting her medications straightened out before she leaves the hospital.   Assess patient for safety, offer medications as prescribed, engage patient in 1:1 staff talks.   Patient able to contract for safety.  Continue to monitor as planned.  

## 2018-04-06 MED ORDER — LORAZEPAM 1 MG PO TABS: 1.0000 mg | ORAL_TABLET | ORAL | Status: DC | PRN

## 2018-04-06 MED ORDER — OLANZAPINE 10 MG PO TBDP: 10.0000 mg | ORAL_TABLET | Freq: Three times a day (TID) | ORAL | Status: DC | PRN

## 2018-04-06 MED ORDER — HYDROXYZINE HCL 50 MG PO TABS: 50.0000 mg | ORAL_TABLET | Freq: Four times a day (QID) | ORAL | Status: DC | PRN

## 2018-04-06 MED ORDER — ZIPRASIDONE MESYLATE 20 MG IM SOLR: 20.0000 mg | INTRAMUSCULAR | Status: DC | PRN

## 2018-04-06 NOTE — Progress Notes (Signed)
[facility]  04/06/2018 1:50 PM [name]  MRN:  [id number]  Subjective: Stephanie BradfordKimberly reports, "I'm better, I actually feel better today. I'm not hearing any voices today. I feel like today is another day to think right & do right. I cannot continue to live in the past. I have been attending group sessions".    [name] is a 57 y/o M with history of schizoaffective disorder bipolar type who was admitted voluntarily from [facility] after she presented with worsening mood symptoms of depression, AH, SI with plan to overdose, and worsening use of crack cocaine. She was restarted on previous medication regimen of seroquel. She has been reporting incremental improvement of her presenting symptoms.  Today upon evaluation, pt shares, "I'm doing better. I'm getting up and feeling better." She says her mood is improving and without SI, plan or intent. She denies HI. She denies AVH. She notes her sleep has improved quite a bit & she slept well last night. She denies any physical complaints. She feels that Seroquel has been helpful for her, but she previously has best response with dose of at least 400 mg at bedtime. Pt agreed with the attending psychiatrist to increase evening dose of Seroquel last night. She has no further questions, comments, or concerns. She agrees to continue current plan of care already in progress.   Principal Problem: Schizoaffective disorder, bipolar type (HCC)  Diagnosis:   Patient Active Problem List   Diagnosis Date Noted  . Schizoaffective disorder, bipolar type (HCC) [F25.0] 05/18/2016    Priority: High  . Cocaine use disorder, moderate, dependence (HCC) [F14.20] 09/22/2015    Priority: Medium  . Bacterial vaginosis [N76.0, B96.89] 03/01/2017  . Annual physical exam [Z00.00] 02/15/2016  . Encounter for routine gynecological examination [Z01.419] 02/15/2016  . Breast cancer screening [Z12.31] 02/15/2016  . Affective psychosis, bipolar (HCC) [F31.9]   .  Cannabis use disorder, moderate, dependence (HCC) [F12.20] 09/22/2015  . UTI (lower urinary tract infection) [N39.0] 09/04/2012   Total Time spent with patient: 15 minutes  Past Psychiatric History: See H&P  Past Medical History:  Past Medical History:  Diagnosis Date  . Bipolar 1 disorder (HCC)   . Depression   . Drug abuse (HCC)   . Schizophrenia [facility](HCC)     Past Surgical History:  Procedure Laterality Date  . NO PAST SURGERIES     Family History:  Family History  Problem Relation Age of Onset  . Mental illness Maternal Aunt   . Cancer Maternal Aunt        breast   . Mental illness Paternal Aunt   . Anesthesia problems Neg Hx   . Hypotension Neg Hx   . Malignant hyperthermia Neg Hx   . Pseudochol deficiency Neg Hx    Family Psychiatric  History: See H&P  Social History:  Social History   Substance and Sexual Activity  Alcohol Use Yes  . Alcohol/week: 1.8 oz  . Types: 3 Cans of beer per week   Comment: once a week a 40 oz.      Social History   Substance and Sexual Activity  Drug Use No  . Types: Marijuana, Cocaine   Comment: occasionally    Social History   Socioeconomic History  . Marital status: Single    Spouse name: Not on file  . Number of children: Not on file  . Years of education: Not on file  . Highest education level: Not on file  Occupational History  . Not on file  Social Needs  . Financial resource strain: Not on file  . Food insecurity:    Worry: Not on file    Inability: Not on file  . Transportation needs:    Medical: Not on file    Non-medical: Not on file  Tobacco Use  . Smoking status: Current Every Day Smoker    Packs/day: 0.25    Years: 10.00    Pack years: 2.50    Types: Cigarettes, Cigars  . Smokeless tobacco: Never Used  Substance and Sexual Activity  . Alcohol use: Yes    Alcohol/week: 1.8 oz    Types: 3 Cans of beer per week    Comment: once a week a 40 oz.   . Drug use: No    Types: Marijuana, Cocaine     Comment: occasionally  . Sexual activity: Yes    Birth control/protection: Condom  Lifestyle  . Physical activity:    Days per week: Not on file    Minutes per session: Not on file  . Stress: Not on file  Relationships  . Social connections:    Talks on phone: Not on file    Gets together: Not on file    Attends religious service: Not on file    Active member of club or organization: Not on file    Attends meetings of clubs or organizations: Not on file    Relationship status: Not on file  Other Topics Concern  . Not on file  Social History Narrative  . Not on file   Additional Social History:   Sleep: Good  Appetite:  Good  Current Medications: Current Facility-Administered Medications  Medication Dose Route Frequency Provider Last Rate Last Dose  . acetaminophen (TYLENOL) tablet 650 mg  650 mg Oral Q6H PRN Rankin, Shuvon B, NP   650 mg at 04/04/18 2118  . alum & mag hydroxide-simeth (MAALOX/MYLANTA) 200-200-20 MG/5ML suspension 30 mL  30 mL Oral Q4H PRN Rankin, Shuvon B, NP      . feeding supplement (ENSURE ENLIVE) (ENSURE ENLIVE) liquid 237 mL  237 mL Oral BID BM Rankin, Shuvon B, NP   237 mL at 04/06/18 0907  . hydrOXYzine (ATARAX/VISTARIL) tablet 50 mg  50 mg Oral Q6H PRN Micheal Likens, MD      . OLANZapine zydis (ZYPREXA) disintegrating tablet 10 mg  10 mg Oral Q8H PRN Micheal Likens, MD       And  . LORazepam (ATIVAN) tablet 1 mg  1 mg Oral PRN Micheal Likens, MD       And  . ziprasidone (GEODON) injection 20 mg  20 mg Intramuscular PRN Micheal Likens, MD      . magnesium hydroxide (MILK OF MAGNESIA) suspension 30 mL  30 mL Oral Daily PRN Rankin, Shuvon B, NP      . multivitamin with minerals tablet 1 tablet  1 tablet Oral Daily Armandina Stammer I, NP   1 tablet at 04/06/18 0907  . nicotine polacrilex (NICORETTE) gum 2 mg  2 mg Oral PRN Micheal Likens, MD   2 mg at 04/04/18 2118  . QUEtiapine (SEROQUEL) tablet 300 mg  300  mg Oral QHS Micheal Likens, MD   300 mg at 04/05/18 2149  . QUEtiapine (SEROQUEL) tablet 50 mg  50 mg Oral Veatrice Kells, MD   50 mg at 04/06/18 0907  . traZODone (DESYREL) tablet 50 mg  50 mg Oral QHS PRN Micheal Likens, MD  Lab Results: No results found for this or any previous visit (from the past 48 hour(s)).  Blood Alcohol level:  Lab Results  Component Value Date   ETH <10 04/02/2018   ETH <5 09/03/2016    Metabolic Disorder Labs: Lab Results  Component Value Date   HGBA1C 5.9 (H) 02/15/2016   MPG 123 (H) 02/15/2016   MPG 123 09/23/2015   Lab Results  Component Value Date   PROLACTIN 14.3 09/23/2015   PROLACTIN 14.1 10/03/2013   Lab Results  Component Value Date   CHOL 163 02/15/2016   TRIG 59 02/15/2016   HDL 95 02/15/2016   CHOLHDL 1.7 02/15/2016   VLDL 12 02/15/2016   LDLCALC 56 02/15/2016   LDLCALC 75 09/23/2015   Physical Findings: AIMS: Facial and Oral Movements Muscles of Facial Expression: None, normal Lips and Perioral Area: None, normal Jaw: None, normal Tongue: None, normal,Extremity Movements Upper (arms, wrists, hands, fingers): None, normal Lower (legs, knees, ankles, toes): None, normal, Trunk Movements Neck, shoulders, hips: None, normal, Overall Severity Severity of abnormal movements (highest score from questions above): None, normal Incapacitation due to abnormal movements: None, normal Patient's awareness of abnormal movements (rate only patient's report): No Awareness, Dental Status Current problems with teeth and/or dentures?: No Does patient usually wear dentures?: No  CIWA:    COWS:     Musculoskeletal: Strength & Muscle Tone: within normal limits Gait & Station: normal Patient leans: N/A  Psychiatric Specialty Exam: Physical Exam  Nursing note and vitals reviewed.   Review of Systems  Constitutional: Negative for chills and fever.  Respiratory: Negative for cough and shortness of  breath.   Cardiovascular: Negative for chest pain.  Gastrointestinal: Negative for abdominal pain, heartburn, nausea and vomiting.  Psychiatric/Behavioral: Positive for depression, hallucinations and suicidal ideas. The patient has insomnia. The patient is not nervous/anxious.     Blood pressure (!) 114/55, pulse 64, temperature 97.8 F (36.6 C), temperature source Oral, resp. rate 18, height 5' (1.524 m), weight 47.6 kg (105 lb).Body mass index is 20.51 kg/m.  General Appearance: Casual and Disheveled  Eye Contact:  Good  Speech:  Clear and Coherent and Normal Rate  Volume:  Normal  Mood:  Depressed  Affect:  Appropriate, Congruent and Flat  Thought Process:  Coherent and Goal Directed  Orientation:  Full (Time, Place, and Person)  Thought Content:  Hallucinations: Visual  Suicidal Thoughts:  Yes.  without intent/plan  Homicidal Thoughts:  No  Memory:  Immediate;   Fair Recent;   Fair Remote;   Fair  Judgement:  Fair  Insight:  Shallow  Psychomotor Activity:  Normal  Concentration:  Concentration: Fair  Recall:  [employer] of Knowledge:  Fair  Language:  Fair  Akathisia:  No  Handed:    AIMS (if indicated):     Assets:  Communication Skills Resilience Social Support  ADL's:  Intact  Cognition:  WNL  Sleep:  Number of Hours: 6.5   Treatment Plan Summary: Daily contact with patient to assess and evaluate symptoms and progress in treatment and Medication management   -Continue inpatient hospitalization.  -Will continue today 04/06/2018 plan as below except where it is noted.  -Schizoaffective disorder, Bipolar Type             - Continue seroquel 50mg  po qAM   - Continue Seroquel 300 mg po qhs.   -Anxiety             - Continue atarax 50mg  po q6h prn anxiety  -  Insomnia              -Continue trazodone 50mg  po qhs prn insomnia  -Agitation             - Continue agitation protocol with zydis/ativan/geodon  -Encourage participation in groups and therapeutic  milieu  -Disposition planning will be ongoing  Armandina Stammer, NP, PMHNP, FNP_BC 04/06/2018, 1:50 PMPatient ID: [name], female   DOB: [birth year], 57 y.o.   MRN: [id number]

## 2018-04-06 NOTE — [facility] Group Notes (Signed)
[facility] Group Notes:  (Nursing/MHT/Case Management/Adjunct)  Date:  04/06/2018  Time:  12:19 PM  Type of Therapy:  Psychoeducational Skills  Participation Level:  Did Not Attend  Participation Quality:  Did not attend  Affect:  Did not attend  Cognitive:  Did not attend  Insight: Did not attend   Engagement in Group:  Did not attend  Modes of Intervention:  Did not attend  Summary of Progress/Problems: Did not attend Orientation/ Goals group  Janne LabShaneka E Trinity Haun 04/06/2018, 12:19 PM

## 2018-04-06 NOTE — Progress Notes (Signed)
Patient ID: [name], female   DOB: 1961/05/22, 57 y.o.   MRN: [id number]    D: Pt has been bright and appropriate on the unit today. Pt did report that she was alittle tired and felt like she need to get some rest. Pt took all medication without any problems. Pt reported that her depression was a 7, her hopelessness was a 7, and her anxiety was a 7. Pt reported that her goal for today was to stay woke.  Pt reported being negative SI/HI, no AH/VH noted. A: 15 min checks continued for patient safety. R: Pt safety maintained.

## 2018-04-06 NOTE — [facility] Group Notes (Signed)
[facility] Group Notes:  (Nursing/MHT/Case Management/Adjunct)  Date:  04/06/2018  Time:  12:37 PM  Type of Therapy:  Psychoeducational Skills  Participation Level:  Did Not Attend  Participation Quality:  Did not attend  Affect:  Did not attend  Cognitive:  Did not attend  Insight: Did not attend   Engagement in Group:  Did not attend  Modes of Intervention:  Did not attend  Summary of Progress/Problems: Pt did not attend Psychoeducational skills group with topic Anger management  [name] 04/06/2018, 12:37 PM

## 2018-04-06 NOTE — Progress Notes (Signed)
D: Pt denies SI/HI/AVH. Pt is pleasant and cooperative. Pt stated she was feeling better and doing better. Pt endorsed Crack / SI dreams , but pt stated she realizes they are only dreams and she does not feel they are there as anything bad for her.   A: Pt was offered support and encouragement. Pt was given scheduled medications. Pt was encourage to attend groups. Q 15 minute checks were done for safety.   R:Pt attends groups and interacts well with peers and staff. Pt is taking medication. Pt has no complaints.Pt receptive to treatment and safety maintained on unit.

## 2018-04-06 NOTE — [facility] Group Notes (Signed)
  [facility] LCSW Group Therapy Note  Date/Time:  04/06/2018 11:15AM-12:00PM  Type of Therapy and Topic:  Group Therapy:  Feelings About Hospitalization  Participation Level:  Minimal   Description of Group This process group involved patients discussing their feelings related to being hospitalized, as well as the benefits they see to being in the hospital.  These feelings and benefits were itemized.  The group then brainstormed specific ways in which they could seek those same benefits when they discharge and return home.  Therapeutic Goals 1. Patient will identify and describe positive and negative feelings related to hospitalization 2. Patient will verbalize benefits of hospitalization to themselves personally 3. Patients will brainstorm together ways they can obtain similar benefits in the outpatient setting, identify barriers to wellness and possible solutions  Summary of Patient Progress:  The patient expressed her primary feelings about being hospitalized are "grateful" and "I don't mind" because she has been to the hospital previously and received help.  She stated she was sober this time for 1 year and relapsed for 5 days.  If at home, she would likely be watching TV and possible still be suicidal.  In fact, she stated her relapse was a suicide attempt.  [facility], and shared she has not adhered to her follow-up plans in the past.  She did state she feels such follow-up will be vital to her remaining sober and mentally stable.  She was not very interested in group, read a magazine and then eventually left group, did not return.  Therapeutic Modalities Cognitive Behavioral Therapy Motivational Interviewing    Ambrose MantleMareida Grossman-Orr, LCSW 04/06/2018, 12:17 PM

## 2018-04-06 NOTE — Plan of Care (Signed)
  Problem: Education: Goal: Emotional status will improve Outcome: Progressing   Problem: Education: Goal: Mental status will improve Outcome: Progressing   Problem: Activity: Goal: Sleeping patterns will improve Outcome: Progressing   Problem: Safety: Goal: Periods of time without injury will increase Outcome: Progressing   Problem: Self-Concept: Goal: Will verbalize positive feelings about self Outcome: Progressing

## 2018-04-07 DIAGNOSIS — R451 Restlessness and agitation: Secondary | ICD-10-CM

## 2018-04-07 DIAGNOSIS — F1099 Alcohol use, unspecified with unspecified alcohol-induced disorder: Secondary | ICD-10-CM

## 2018-04-07 DIAGNOSIS — F1290 Cannabis use, unspecified, uncomplicated: Secondary | ICD-10-CM

## 2018-04-07 DIAGNOSIS — F1490 Cocaine use, unspecified, uncomplicated: Secondary | ICD-10-CM

## 2018-04-07 DIAGNOSIS — Z7251 High risk heterosexual behavior: Secondary | ICD-10-CM

## 2018-04-07 MED ORDER — QUETIAPINE FUMARATE 400 MG PO TABS
400.0000 mg | ORAL_TABLET | Freq: Every day | ORAL | Status: DC
  Administered 2018-04-07: 400 mg via ORAL
  Filled 2018-04-07 (×2): qty 1

## 2018-04-07 MED ORDER — METRONIDAZOLE 500 MG PO TABS
500.0000 mg | ORAL_TABLET | Freq: Two times a day (BID) | ORAL | Status: DC
  Administered 2018-04-07: 500 mg via ORAL
  Filled 2018-04-07 (×3): qty 1

## 2018-04-07 MED ORDER — METRONIDAZOLE 500 MG PO TABS
500.0000 mg | ORAL_TABLET | Freq: Two times a day (BID) | ORAL | Status: DC
  Administered 2018-04-07 – 2018-04-08 (×2): 500 mg via ORAL
  Filled 2018-04-07 (×3): qty 1
  Filled 2018-04-07: qty 2
  Filled 2018-04-07: qty 1

## 2018-04-07 NOTE — Progress Notes (Signed)
EKG completed and put on patient's chart.  

## 2018-04-07 NOTE — Plan of Care (Signed)
Nurse discussed anxiety, depression, coping skills with patient. 

## 2018-04-07 NOTE — Progress Notes (Signed)
D:  Patient's self inventory sheet, patient has fair sleep, no sleep medication given.  Good appetite, low energy level, poor concentration.  Rated depression, hopeless and anxiety 7.  Withdrawals, agitation, irritability.  SI, contracts for safety.  Physical problems, headaches.  Denied physical pain, worst pain #4 in past 24 hours.  No pain medicine.  Goal is stay awake.  No discharge plans. A:  Medications administered per MD orders.  Emotional support and encouragement given patient. R:  Denied HI.  Denied A/V hallucinations.  SI off/on, contracts for safety.   Safety maintained with 15 minute checks. Patient had told nurse earlier today that she was not SI.  Then wrote on self inventory form that she does have SI thoughts.  Patient has been in bed most of the day.

## 2018-04-07 NOTE — Progress Notes (Signed)
[facility]  04/07/2018 11:18 AM [name]  MRN:  [id number]  Subjective: Patient seen resting in bed.  Patient is awake alert and oriented x3.  Reports" I want to discharge on Tuesday."  Reports she is no longer suicidal.  Reports suicidal thoughts and drug use is only in her dreams.  Patient reports she is feeling better however is unable to sleep.  Denies depression or depressive symptoms.  Reports she is has attended group session.  Reports his Seroquel was recently adjusted and states that she takes 400 mg at home.  Cala BradfordKimberly is requesting STI testing,She reports she had unprotected sex prior to admission.  Reports discharge with a fishy odor.  NP to initiate Flagyl see chart for STI orders.  Support and encouragement and reassurance was provided.  History: Stephanie GongKimberly [name] is a 57 y/o M with history of schizoaffective disorder bipolar type who [facility] after she presented with worsening mood symptoms of depression, SI with plan to overdose, and worsening use of crack cocaine. Upon initial presentation pt shares, "I went on a binge. I relapsed and I was depressed. I was going to kill myself with alcohol and cocaine. I want something different. I want to get back on my meds and [facility] Pt shares that she stopped her previous outpatient medications about 1 week prior to presenting to the ED, and she began to use cocaine and alcohol daily after a previous period of sobriety for about 3 years.    Principal Problem: Schizoaffective disorder, bipolar type (HCC) Diagnosis:   Patient Active Problem List   Diagnosis Date Noted  . Bacterial vaginosis [N76.0, B96.89] 03/01/2017  . Schizoaffective disorder, bipolar type (HCC) [F25.0] 05/18/2016  . Annual physical exam [Z00.00] 02/15/2016  . Encounter for routine gynecological examination [Z01.419] 02/15/2016  . Breast cancer screening [Z12.31] 02/15/2016  . Affective psychosis, bipolar (HCC)  [F31.9]   . Cannabis use disorder, moderate, dependence (HCC) [F12.20] 09/22/2015  . Cocaine use disorder, moderate, dependence (HCC) [F14.20] 09/22/2015  . UTI (lower urinary tract infection) [N39.0] 09/04/2012   Total Time spent with patient: 20 minutes  Past Psychiatric History:   Past Medical History:  Past Medical History:  Diagnosis Date  . Bipolar 1 disorder (HCC)   . Depression   . Drug abuse (HCC)   . Schizophrenia [facility](HCC)     Past Surgical History:  Procedure Laterality Date  . NO PAST SURGERIES     Family History:  Family History  Problem Relation Age of Onset  . Mental illness Maternal Aunt   . Cancer Maternal Aunt        breast   . Mental illness Paternal Aunt   . Anesthesia problems Neg Hx   . Hypotension Neg Hx   . Malignant hyperthermia Neg Hx   . Pseudochol deficiency Neg Hx    Family Psychiatric  History:  Social History:  Social History   Substance and Sexual Activity  Alcohol Use Yes  . Alcohol/week: 1.8 oz  . Types: 3 Cans of beer per week   Comment: once a week a 40 oz.      Social History   Substance and Sexual Activity  Drug Use No  . Types: Marijuana, Cocaine   Comment: occasionally    Social History   Socioeconomic History  . Marital status: Single    Spouse name: Not on file  . Number of children: Not on file  . Years of education: Not on  file  . Highest education level: Not on file  Occupational History  . Not on file  Social Needs  . Financial resource strain: Not on file  . Food insecurity:    Worry: Not on file    Inability: Not on file  . Transportation needs:    Medical: Not on file    Non-medical: Not on file  Tobacco Use  . Smoking status: Current Every Day Smoker    Packs/day: 0.25    Years: 10.00    Pack years: 2.50    Types: Cigarettes, Cigars  . Smokeless tobacco: Never Used  Substance and Sexual Activity  . Alcohol use: Yes    Alcohol/week: 1.8 oz    Types: 3 Cans of beer per week    Comment: once a  week a 40 oz.   . Drug use: No    Types: Marijuana, Cocaine    Comment: occasionally  . Sexual activity: Yes    Birth control/protection: Condom  Lifestyle  . Physical activity:    Days per week: Not on file    Minutes per session: Not on file  . Stress: Not on file  Relationships  . Social connections:    Talks on phone: Not on file    Gets together: Not on file    Attends religious service: Not on file    Active member of club or organization: Not on file    Attends meetings of clubs or organizations: Not on file    Relationship status: Not on file  Other Topics Concern  . Not on file  Social History Narrative  . Not on file   Additional Social History:                         Sleep: Poor  Appetite:  Fair  Current Medications: Current Facility-Administered Medications  Medication Dose Route Frequency Provider Last Rate Last Dose  . acetaminophen (TYLENOL) tablet 650 mg  650 mg Oral Q6H PRN Rankin, Shuvon B, NP   650 mg at 04/06/18 2118  . alum & mag hydroxide-simeth (MAALOX/MYLANTA) 200-200-20 MG/5ML suspension 30 mL  30 mL Oral Q4H PRN Rankin, Shuvon B, NP      . feeding supplement (ENSURE ENLIVE) (ENSURE ENLIVE) liquid 237 mL  237 mL Oral BID BM Rankin, Shuvon B, NP   237 mL at 04/07/18 0903  . hydrOXYzine (ATARAX/VISTARIL) tablet 50 mg  50 mg Oral Q6H PRN Micheal Likens, MD      . OLANZapine zydis (ZYPREXA) disintegrating tablet 10 mg  10 mg Oral Q8H PRN Micheal Likens, MD       And  . LORazepam (ATIVAN) tablet 1 mg  1 mg Oral PRN Micheal Likens, MD       And  . ziprasidone (GEODON) injection 20 mg  20 mg Intramuscular PRN Micheal Likens, MD      . magnesium hydroxide (MILK OF MAGNESIA) suspension 30 mL  30 mL Oral Daily PRN Rankin, Shuvon B, NP      . multivitamin with minerals tablet 1 tablet  1 tablet Oral Daily Armandina Stammer I, NP   1 tablet at 04/07/18 0901  . nicotine polacrilex (NICORETTE) gum 2 mg  2 mg Oral  PRN Micheal Likens, MD   2 mg at 04/04/18 2118  . QUEtiapine (SEROQUEL) tablet 300 mg  300 mg Oral QHS Micheal Likens, MD   300 mg at 04/06/18 2116  . QUEtiapine (SEROQUEL) tablet  50 mg  50 mg Oral Veatrice Kells, MD   50 mg at 04/07/18 0902  . traZODone (DESYREL) tablet 50 mg  50 mg Oral QHS PRN Micheal Likens, MD        Lab Results: No results found for this or any previous visit (from the past 48 hour(s)).  Blood Alcohol level:  Lab Results  Component Value Date   ETH <10 04/02/2018   ETH <5 09/03/2016    Metabolic Disorder Labs: Lab Results  Component Value Date   HGBA1C 5.9 (H) 02/15/2016   MPG 123 (H) 02/15/2016   MPG 123 09/23/2015   Lab Results  Component Value Date   PROLACTIN 14.3 09/23/2015   PROLACTIN 14.1 10/03/2013   Lab Results  Component Value Date   CHOL 163 02/15/2016   TRIG 59 02/15/2016   HDL 95 02/15/2016   CHOLHDL 1.7 02/15/2016   VLDL 12 02/15/2016   LDLCALC 56 02/15/2016   LDLCALC 75 09/23/2015    Physical Findings: AIMS: Facial and Oral Movements Muscles of Facial Expression: None, normal Lips and Perioral Area: None, normal Jaw: None, normal Tongue: None, normal,Extremity Movements Upper (arms, wrists, hands, fingers): None, normal Lower (legs, knees, ankles, toes): None, normal, Trunk Movements Neck, shoulders, hips: None, normal, Overall Severity Severity of abnormal movements (highest score from questions above): None, normal Incapacitation due to abnormal movements: None, normal Patient's awareness of abnormal movements (rate only patient's report): No Awareness, Dental Status Current problems with teeth and/or dentures?: No Does patient usually wear dentures?: No  CIWA:    COWS:     Musculoskeletal: Strength & Muscle Tone: within normal limits Gait & Station: normal Patient leans: N/A  Psychiatric Specialty Exam: Physical Exam  Vitals reviewed. Constitutional: She is oriented  to person, place, and time. She appears well-developed.  Cardiovascular: Normal rate.  Neurological: She is alert and oriented to person, place, and time.  Psychiatric: She has a normal mood and affect. Her behavior is normal.    Review of Systems  Psychiatric/Behavioral: Positive for substance abuse. Negative for depression and suicidal ideas. The patient is nervous/anxious.     Blood pressure (!) 114/55, pulse 64, temperature 97.8 F (36.6 C), temperature source Oral, resp. rate 18, height 5' (1.524 m), weight 47.6 kg (105 lb).Body mass index is 20.51 kg/m.  General Appearance: Fairly Groomed  Eye Contact:  Fair  Speech:  Clear and Coherent Sightly pressured   Volume:  Normal  Mood:  Anxious  Affect:  Congruent  Thought Process:  Coherent  Orientation:  Full (Time, Place, and Person)  Thought Content:  Hallucinations: Visual  Suicidal Thoughts:  No  Homicidal Thoughts:  No  Memory:  Immediate;   Fair Recent;   Fair Remote;   Fair  Judgement:  Fair  Insight:  Fair  Psychomotor Activity:  Normal  Concentration:  Concentration: Fair  Recall:  [employer] of Knowledge:  Fair  Language:  Fair  Akathisia:  No  Handed:  Right  AIMS (if indicated):     Assets:  Communication Skills Desire for Improvement Resilience Social Support  ADL's:  Intact  Cognition:  WNL  Sleep:  Number of Hours: 6.75     Treatment Plan Summary: Daily contact with patient to assess and evaluate symptoms and progress in treatment and Medication management    -Will continue treatment plan 04/07/2018 plan as below except where it is noted.  -Schizoaffective disorder, Bipolar Type - Continue Seroquel 50mg  po qAM              -  Increased  Seroquel 300 mg  To 400 mg po qhs.   -Anxiety - Continue atarax 50mg  po q6h prn anxiety  -Insomnia -discontinue trazodone 50mg  po qhs prn insomnia  -Agitation - Continue agitation protocol with  zydis/ativan/geodon  STI testing- see chart/ EKG  Start metronidazole 500 mg BID po for 7 days  -Encourage participation in groups and therapeutic milieu -Disposition planning will be ongoing    Oneta Rack, NP 04/07/2018, 11:18 AM

## 2018-04-07 NOTE — Progress Notes (Signed)
Nursing Progress Note  Data: Patient calm, cooperative and pleasant this morning. Patient complaint with scheduled medications.   Action: Patient educated about and provided medication per provider's orders. Patient safety maintained with q15 min safety checks. Low fall risk precautions in place. [facility] [address] [facility] provider [facility]. Report given to receiving RN.  Response: Patient and belongings escorted to [address]. Patient remains safe on the unit at this time.

## 2018-04-07 NOTE — BHH Group Notes (Signed)
[facility] Group Notes:  (Nursing/MHT/Case Management/Adjunct)  Date:  04/07/2018  Time:  1:15 pm  Type of Therapy:  Psychoeducational Skills  Participation Level:  Did Not Attend  Participation Quality:    Affect:    Cognitive:    Insight:    Engagement in Group:    Modes of Intervention:    Summary of Progress/Problems:  Earline MayotteKnight, Cathline Dowen Shephard 04/07/2018, 3:19 PM

## 2018-04-08 LAB — LIPID PANEL
CHOLESTEROL: 219 mg/dL — AB (ref 0–200)
HDL: 98 mg/dL (ref >40)
LDL Cholesterol: 86 mg/dL (ref 0–99)
TRIGLYCERIDES: 177 mg/dL — AB (ref <150)
Total CHOL/HDL Ratio: 2.2 RATIO
VLDL: 35 mg/dL (ref 0–40)

## 2018-04-08 LAB — GC/CHLAMYDIA PROBE AMP (~~LOC~~) NOT AT ARMC
Chlamydia: NEGATIVE
NEISSERIA GONORRHEA: NEGATIVE

## 2018-04-08 LAB — HEMOGLOBIN A1C
HEMOGLOBIN A1C: 5.7 % — AB (ref 4.8–5.6)
Mean Plasma Glucose: 116.89 mg/dL

## 2018-04-08 MED ORDER — NICOTINE POLACRILEX 2 MG MT GUM: 2.0000 mg | CHEWING_GUM | OROMUCOSAL | 0 refills | Status: DC | PRN

## 2018-04-08 MED ORDER — QUETIAPINE FUMARATE 400 MG PO TABS: 400.0000 mg | ORAL_TABLET | Freq: Every day | ORAL | 0 refills | Status: DC

## 2018-04-08 MED ORDER — HYDROXYZINE HCL 50 MG PO TABS: 50.0000 mg | ORAL_TABLET | Freq: Four times a day (QID) | ORAL | 0 refills | Status: DC | PRN

## 2018-04-08 MED ORDER — METRONIDAZOLE 500 MG PO TABS: 500.0000 mg | ORAL_TABLET | Freq: Two times a day (BID) | ORAL | 0 refills | Status: DC

## 2018-04-08 NOTE — [facility] Suicide Risk Assessment (Signed)
[facility]   Principal Problem: Schizoaffective disorder, bipolar type [facility](HCC) Discharge Diagnoses:  Patient Active Problem List   Diagnosis Date Noted  . Bacterial vaginosis [N76.0, B96.89] 03/01/2017  . Schizoaffective disorder, bipolar type (HCC) [F25.0] 05/18/2016  . Annual physical exam [Z00.00] 02/15/2016  . Encounter for routine gynecological examination [Z01.419] 02/15/2016  . Breast cancer screening [Z12.31] 02/15/2016  . Affective psychosis, bipolar (HCC) [F31.9]   . Cannabis use disorder, moderate, dependence (HCC) [F12.20] 09/22/2015  . Cocaine use disorder, moderate, dependence (HCC) [F14.20] 09/22/2015  . UTI (lower urinary tract infection) [N39.0] 09/04/2012    Total Time spent with patient: 30 minutes  Musculoskeletal: Strength & Muscle Tone: within normal limits Gait & Station: normal Patient leans: N/A  Psychiatric Specialty Exam: Review of Systems  Constitutional: Negative for chills and fever.  Respiratory: Negative for cough and shortness of breath.   Cardiovascular: Negative for chest pain.  Gastrointestinal: Negative for abdominal pain, heartburn, nausea and vomiting.  Psychiatric/Behavioral: Negative for depression, hallucinations and suicidal ideas. The patient is not nervous/anxious and does not have insomnia.     Blood pressure 123/73, pulse 72, temperature 97.8 F (36.6 C), temperature source Oral, resp. rate 16, height 5' (1.524 m), weight 47.6 kg (105 lb).Body mass index is 20.51 kg/m.  General Appearance: Casual and Fairly Groomed  Patent attorneyye Contact::  Good  Speech:  Clear and Coherent and Normal Rate  Volume:  Normal  Mood:  Euthymic  Affect:  Appropriate, Congruent and Full Range  Thought Process:  Coherent and Goal Directed  Orientation:  Full (Time, Place, and Person)  Thought Content:  Logical  Suicidal Thoughts:  No  Homicidal Thoughts:  No  Memory:  Immediate;   Fair Recent;   Fair Remote;   Fair  Judgement:   Fair  Insight:  Lacking  Psychomotor Activity:  Normal  Concentration:  Fair  Recall:  [employer]  Fund of Knowledge:Fair  Language: Fair  Akathisia:  No  Handed:    AIMS (if indicated):     Assets:  Communication Skills Desire for Improvement Resilience Social Support  Sleep:  Number of Hours: 6.5  Cognition: WNL  ADL's:  Intact   Mental Status Per Nursing Assessment::   On Admission:  Self-harm thoughts  Demographic Factors:  Low socioeconomic status and Unemployed  Loss Factors: Financial problems/change in socioeconomic status  Historical Factors: Impulsivity  Risk Reduction Factors:   Positive social support, Positive therapeutic relationship and Positive coping skills or problem solving skills  Continued Clinical Symptoms:  Severe Anxiety and/or Agitation Alcohol/Substance Abuse/Dependencies Schizophrenia:   Paranoid or undifferentiated type  Cognitive Features That Contribute To Risk:  None    Suicide Risk:  Minimal: No identifiable suicidal ideation.  Patients presenting with no risk factors but with morbid ruminations; may be classified as minimal risk based on the severity of the depressive symptoms  Follow-up Information    [facility] Follow up on 04/09/2018.   Why:  Tuesday at 8AM with Nicole Cellaorothy for your hospital follow up appointment.  Bring along ID, insurance card and hospital d/c paperwork Contact information: [address] [place] [address] [phone]         Subjective Data: [name] is a 57 y/o M with history of schizoaffective disorder bipolar type who [facility] after she presented with worsening mood symptoms of depression, AH, SI with plan to overdose, and worsening use of crack cocaine. She was restarted on previous medication regimen of seroquel, which has been  titrated up during her stay. She has been reporting incremental improvement of her presenting symptoms.  Today upon evaluation, pt shares, "I'm  feeling better today. I'm not having any more suicidal thoughts, but I had nightmares about them." Pt shares that her mood has improved and she is feeling improved in regards to her physical symptoms as well. She denies SI/HI/AH/VH. She is sleeping well with exception of having a nightmare. Her appetite is good. She is tolerating her medications well. She is in agreement to continue her current treatment regimen without changes. She declines referral to substance use treatment, and she shares that she plans to follow up at [facility]. She was able to engage in safety planning including plan to return to [facility] or contact emergency services if she feels unable to maintain her own safety or the safety of others. Pt had no further questions, comments, or concerns.   Plan Of Care/Follow-up recommendations:   -Discharge to outpatient level of care  -Schizoaffective disorder, Bipolar Type - Continue seroquel 50mg  po qAM             -Continue seroquel 400mg  po qhs   -UTI   - Continue Flagyl 500mg  po BID for 6 more days  -Anxiety - Continue atarax 50mg  po q6h prn anxiety  -Insomnia -Continue trazodone 50mg  po qhs prn insomnia  Activity:  as tolerated Diet:  normal Tests:  NA Other:  see above for DC plan  Micheal Likens, MD 04/08/2018, 9:31 AM

## 2018-04-08 NOTE — Progress Notes (Signed)
Discharge Note:  Patient discharged with 2 bus tickets.  Patient denied SI and HI.  Denied A/V hallucinations.  Suicide prevention information given and discussed with patient who stated she understood and had no questions.  Patient stated she received all her belongings, clothes, toiletries, misc items, prescriptions, etc.  Patient stated she appreciated all [facility].  All required discharge information given to patient at discharge.

## 2018-04-08 NOTE — Progress Notes (Signed)
Recreation Therapy Notes  Date: 4.15.19 Time: 9:30 a.m. Location: [address]   Group Topic: Stress Management   Goal Area(s) Addresses:  Goal 1.1: To reduce stress  -Patient will feel a reduction in stress level  -Patient will understand the importance of stress management  -Patient will participate during stress management group      Intervention: Stress Management  Activity: Guided Imagery: Patients were in a peaceful environment with soft lighting enhancing patients mood. Patients listened to a guided imagery script read by Recreation Therapy Intern.  Education: Stress Management, Discharge Planning.    Education Outcome: Acknowledges edcuation/In group clarification offered/Needs additional education   Clinical Observations/Feedback:: Patient did not attend     Sheryle HailDarian Jeannette Maddy, Recreation Therapy Intern   Sheryle HailDarian Irini Leet 04/08/2018 9:08 AM

## 2018-04-08 NOTE — Progress Notes (Signed)
  [facility] :  Will you be returning to the same living situation after discharge:  Yes,  patient is returning home alone At discharge, do you have transportation home?: Yes,  bus passes Do you have the ability to pay for your medications: Yes,  [employer] Medicare  Release of information consent forms completed and in the chart;  Patient's signature needed at discharge.  Patient to Follow up at: Follow-up Information    [facility] Follow up on 04/09/2018.   Why:  Tuesday at 8AM with Nicole Cellaorothy for your hospital follow up appointment.  Bring along ID, insurance card and hospital d/c paperwork Contact information: [address] [place] [address] [phone]           Next level of care provider has [facility] Link:yes  Safety Planning and Suicide Prevention discussed: Yes,  with the patient  Have you used any form of tobacco in the last 30 days? (Cigarettes, Smokeless Tobacco, Cigars, and/or Pipes): Yes  Has patient been referred to the Quitline?: Patient refused referral  Patient has been referred for addiction treatment: Pt. refused referral  Stephanie SarahJolan E Ewell [name], LCSWA 04/08/2018, 11:49 AM

## 2018-04-08 NOTE — Progress Notes (Signed)
  [facility]  Will you be returning to the same living situation after discharge:  Yes,  home At discharge, do you have transportation home?: Yes,  family Do you have the ability to pay for your medications: Yes,  insurance  Release of information consent forms completed and in the chart;  Patient's signature needed at discharge.  Patient to Follow up at: Follow-up Information    [facility] Follow up on 04/09/2018.   Why:  Tuesday at 8AM with Nicole Cellaorothy for your hospital follow up appointment.  Bring along ID, insurance card and hospital d/c paperwork Contact information: [address] [place] [address] [phone]           Next level of care provider has [facility] to [facility] Link:no  Safety Planning and Suicide Prevention discussed: Yes,  yes  Have you used any form of tobacco in the last 30 days? (Cigarettes, Smokeless Tobacco, Cigars, and/or Pipes): Yes  Has patient been referred to the Quitline?: Patient refused referral  Patient has been referred for addiction treatment: Pt. refused referral  Ida RogueRodney B Foxx Klarich, LCSW 04/08/2018, 8:57 AM

## 2018-04-08 NOTE — Tx Team (Signed)
Interdisciplinary Treatment and Diagnostic Plan Update  04/08/2018 Time of Session: 8:43 AM  [name] MRN: [id number]  Principal Diagnosis: Schizoaffective disorder, bipolar type (HCC)  Secondary Diagnoses: Principal Problem:   Schizoaffective disorder, bipolar type (HCC) Active Problems:   Cocaine use disorder, moderate, dependence (HCC)   Current Medications:  Current Facility-Administered Medications  Medication Dose Route Frequency Provider Last Rate Last Dose  . acetaminophen (TYLENOL) tablet 650 mg  650 mg Oral Q6H PRN Rankin, Shuvon B, NP   650 mg at 04/07/18 2120  . alum & mag hydroxide-simeth (MAALOX/MYLANTA) 200-200-20 MG/5ML suspension 30 mL  30 mL Oral Q4H PRN Rankin, Shuvon B, NP      . feeding supplement (ENSURE ENLIVE) (ENSURE ENLIVE) liquid 237 mL  237 mL Oral BID BM Rankin, Shuvon B, NP   237 mL at 04/07/18 1525  . hydrOXYzine (ATARAX/VISTARIL) tablet 50 mg  50 mg Oral Q6H PRN Micheal Likensainville, Christopher T, MD      . OLANZapine zydis (ZYPREXA) disintegrating tablet 10 mg  10 mg Oral Q8H PRN Micheal Likensainville, Christopher T, MD       And  . LORazepam (ATIVAN) tablet 1 mg  1 mg Oral PRN Micheal Likensainville, Christopher T, MD       And  . ziprasidone (GEODON) injection 20 mg  20 mg Intramuscular PRN Micheal Likensainville, Christopher T, MD      . magnesium hydroxide (MILK OF MAGNESIA) suspension 30 mL  30 mL Oral Daily PRN Rankin, Shuvon B, NP      . metroNIDAZOLE (FLAGYL) tablet 500 mg  500 mg Oral Q12H Oneta RackLewis, Tanika N, NP   500 mg at 04/08/18 0827  . multivitamin with minerals tablet 1 tablet  1 tablet Oral Daily Armandina StammerNwoko, Agnes I, NP   1 tablet at 04/08/18 [id number]  . nicotine polacrilex (NICORETTE) gum 2 mg  2 mg Oral PRN Micheal Likensainville, Christopher T, MD   2 mg at 04/04/18 2118  . QUEtiapine (SEROQUEL) tablet 400 mg  400 mg Oral QHS Oneta RackLewis, Tanika N, NP   400 mg at 04/07/18 2120  . QUEtiapine (SEROQUEL) tablet 50 mg  50 mg Oral Veatrice KellsBH-q7a [place], Christopher T, MD   50 mg at 04/08/18 0827    PTA  Medications: Medications Prior to Admission  Medication Sig Dispense Refill Last Dose  . ENSURE PLUS (ENSURE PLUS) LIQD Take 237 mLs by mouth 2 (two) times daily between meals.   Past Week at Unknown time  . lamoTRIgine (LAMICTAL) 25 MG tablet Take 2 tablets (50 mg total) by mouth every evening. (Patient not taking: Reported on 03/01/2017) 30 tablet 0 Not Taking at Unknown time  . metroNIDAZOLE (FLAGYL) 500 MG tablet Take 1 tablet (500 mg total) by mouth 2 (two) times daily. (Patient not taking: Reported on 04/02/2018) 14 tablet 0 Completed Course at Unknown time  . Multiple Vitamin (MULTIVITAMIN WITH MINERALS) TABS tablet Take 1 tablet by mouth daily.   Past Week at Unknown time  . QUEtiapine (SEROQUEL) 400 MG tablet Take 1 tablet (400 mg total) by mouth at bedtime. 30 tablet 0 Past Week at Unknown time    Treatment Modalities: Medication Management, Group therapy, Case management,  1 to 1 session with clinician, Psychoeducation, Recreational therapy.  Patient Stressors: Medication change or noncompliance Substance abuse  Patient Strengths: Capable of independent living Wellsite geologistCommunication skills General fund of knowledge Motivation for treatment/growth Physical Health   Physician Treatment Plan for Primary Diagnosis: Schizoaffective disorder, bipolar type (HCC) Long Term Goal(s): Improvement in symptoms so as ready for discharge  Short Term Goals: Ability to identify changes in lifestyle to reduce recurrence of condition will improve Ability to demonstrate self-control will improve Ability to identify and develop effective coping behaviors will improve Compliance with prescribed medications will improve Ability to identify triggers associated with substance abuse/mental health issues will improve  Medication Management: Evaluate patient's response, side effects, and tolerance of medication regimen.  Therapeutic Interventions: 1 to 1 sessions, Unit Group sessions and Medication  administration.  Evaluation of Outcomes: Adequate for Discharge  Physician Treatment Plan for Secondary Diagnosis: Principal Problem:   Schizoaffective disorder, bipolar type (HCC) Active Problems:   Cocaine use disorder, moderate, dependence (HCC)  Long Term Goal(s): Improvement in symptoms so as ready for discharge  Short Term Goals: Ability to identify changes in lifestyle to reduce recurrence of condition will improve Ability to demonstrate self-control will improve Ability to identify and develop effective coping behaviors will improve Compliance with prescribed medications will improve Ability to identify triggers associated with substance abuse/mental health issues will improve  Medication Management: Evaluate patient's response, side effects, and tolerance of medication regimen.  Therapeutic Interventions: 1 to 1 sessions, Unit Group sessions and Medication administration.  Evaluation of Outcomes: Adequate for Discharge   RN Treatment Plan for Primary Diagnosis: Schizoaffective disorder, bipolar type (HCC) Long Term Goal(s): Knowledge of disease and therapeutic regimen to maintain health will improve  Short Term Goals: Ability to remain free from injury will improve, Ability to disclose and discuss suicidal ideas, Ability to identify and develop effective coping behaviors will improve and Compliance with prescribed medications will improve  Medication Management: RN will administer medications as ordered by provider, will assess and evaluate patient's response and provide education to patient for prescribed medication. RN will report any adverse and/or side effects to prescribing provider.  Therapeutic Interventions: 1 on 1 counseling sessions, Psychoeducation, Medication administration, Evaluate responses to treatment, Monitor vital signs and CBGs as ordered, Perform/monitor CIWA, COWS, AIMS and Fall Risk screenings as ordered, Perform wound care treatments as  ordered.  Evaluation of Outcomes: Adequate for Discharge   LCSW Treatment Plan for Primary Diagnosis: Schizoaffective disorder, bipolar type (HCC) Long Term Goal(s): Safe transition to appropriate next level of care at discharge, Engage patient in therapeutic group addressing interpersonal concerns.  Short Term Goals: Engage patient in aftercare planning with referrals and resources, Facilitate acceptance of mental health diagnosis and concerns, Facilitate patient progression through stages of change regarding substance use diagnoses and concerns, Identify triggers associated with mental health/substance abuse issues and Increase skills for wellness and recovery  Therapeutic Interventions: Assess for all discharge needs, 1 to 1 time with Social worker, Explore available resources and support systems, Assess for adequacy in community support network, Educate family and significant other(s) on suicide prevention, Complete Psychosocial Assessment, Interpersonal group therapy.  Evaluation of Outcomes: Adequate for Discharge   Progress in Treatment: Attending groups: Yes Participating in groups: Yes Taking medication as prescribed: Yes Toleration of medication: Yes, no side effects reported at this time Family/Significant other contact made: No  Patient understands diagnosis: Yes AEB asking for help with medications  Discussing patient identified problems/goals with staff: Yes Medical problems stabilized or resolved: Yes Denies suicidal/homicidal ideation: Yes Issues/concerns per patient self-inventory: None Other: N/A  New problem(s) identified: None identified at this time.   New Short Term/Long Term Goal(s): "Get back on my medications, quit using again, and to go home".   Discharge Plan or Barriers:  Upon discharge pt will return to her home and follow up with [facility]  in [place].  Reason for Continuation of Hospitalization: None    Estimated Length of Stay: Discharging Monday,  04/08/18  Attendees: Patient: [name]  04/08/2018  8:43 AM  Physician: Jolyne Loa, MD; Dr, Landry Mellow, MD 04/08/2018  8:43 AM  Nursing: Estella Husk, RN; Quintella Reichert, RN 04/08/2018  8:43 AM  RN Care Manager: Onnie Boer, RN 04/08/2018  8:43 AM  Social Worker: Richelle Ito, LCSW; Melba Coon, Social Work Intern; Baldo Daub, LCSWA 04/08/2018  8:43 AM  Recreational Therapist: Caroll Rancher, LRT 04/08/2018  8:43 AM  Other: Tomasita Morrow, P4CC 04/08/2018  8:43 AM  Other: X 04/08/2018  8:43 AM  Other:X 04/08/2018  8:43 AM    Scribe for Treatment Team: Maeola Sarah, LCSWA 04/08/2018 8:43 AM

## 2018-04-08 NOTE — Progress Notes (Signed)
D:  Patient's self inventory sheet, patient has fair sleep, no sleep medication given.  Good appetite, low energy level, poor concentration.  Rated depression and hopeless, anxiety #7.  Denied withdrawals.  Checked agitation.  Physical problems headaches, worst pain #8 in past 24 hours.  Goal is attend groups.  Stay awake.  No discharge plans. A:  Medications administered per MD orders.  Emotional support and encouragement given patient. R:  Denied SI and HI, contracts for safety.  Denied A/V hallucinations.  Safety maintained with 15 minute checks.

## 2018-04-08 NOTE — Discharge Summary (Addendum)
Physician Discharge Summary Note  Patient:  [name] is an 57 y.o., female MRN:  [id number] DOB:  October 13, 1961 Patient phone:  [phone] (home)  Patient address:   [address] [place] [address],  Total Time spent with patient: 20 minutes  Date of Admission:  04/02/2018 Date of Discharge:  04/08/2018  Reason for Admission:  Per assessment note- This is one of several admission assessment from this hospital alone for [name], a 62 year old AA female with hx of Cocaine use disorder, chronic & chronic mental illness. She is well known on this unit from previous hospitalizations for mood stabilization treatments. She receives her maintenance mental health care on an outpatient basis at [facility]. She is currently admitted to the [facility] with complaints of 5 days of crack cocaine binge triggering worsening depression & suicidal ideations with plans to overdose on the crack. She is seeking help in resuming her mental health medications. During this assessment, [name] reports, "I'm, sorry that I started using crack again. I have been sober for a while & doing really well. On the 3rd day of this month, I received my disability check. Then, I thought about using, I did, only that I went on a 5 day binge at my friend's house. Then, I looked around & saw that the people that I was using the drugs with were very young. They were young enough to be my grand kids. Then, I said to myself, was this what it has become for me? What was I doing? Then, I thought, I have got to get myself together. I have not been on my psych medicines in over a week. Then, I started feeling depressed & suicidal. I wanted to overdose on the cocaine & get it over with, but, I came here instead. I need to get back on my medicines (Seroquel 400 at bedtime & 50 mg in the daytime). I don't need any substance abuse treatment because I know better".    Principal Problem: Schizoaffective disorder, bipolar type  [facility]) Discharge Diagnoses: Patient Active Problem List   Diagnosis Date Noted  . Bacterial vaginosis [N76.0, B96.89] 03/01/2017  . Schizoaffective disorder, bipolar type (HCC) [F25.0] 05/18/2016  . Annual physical exam [Z00.00] 02/15/2016  . Encounter for routine gynecological examination [Z01.419] 02/15/2016  . Breast cancer screening [Z12.31] 02/15/2016  . Affective psychosis, bipolar (HCC) [F31.9]   . Cannabis use disorder, moderate, dependence (HCC) [F12.20] 09/22/2015  . Cocaine use disorder, moderate, dependence (HCC) [F14.20] 09/22/2015  . UTI (lower urinary tract infection) [N39.0] 09/04/2012    Past Psychiatric History:   Past Medical History:  Past Medical History:  Diagnosis Date  . Bipolar 1 disorder (HCC)   . Depression   . Drug abuse (HCC)   . Schizophrenia [facility])     Past Surgical History:  Procedure Laterality Date  . NO PAST SURGERIES     Family History:  Family History  Problem Relation Age of Onset  . Mental illness Maternal Aunt   . Cancer Maternal Aunt        breast   . Mental illness Paternal Aunt   . Anesthesia problems Neg Hx   . Hypotension Neg Hx   . Malignant hyperthermia Neg Hx   . Pseudochol deficiency Neg Hx    Family Psychiatric  History:  Social History:  Social History   Substance and Sexual Activity  Alcohol Use Yes  . Alcohol/week: 1.8 oz  . Types: 3 Cans of beer per week   Comment: once  a week a 40 oz.      Social History   Substance and Sexual Activity  Drug Use No  . Types: Marijuana, Cocaine   Comment: occasionally    Social History   Socioeconomic History  . Marital status: Single    Spouse name: Not on file  . Number of children: Not on file  . Years of education: Not on file  . Highest education level: Not on file  Occupational History  . Not on file  Social Needs  . Financial resource strain: Not on file  . Food insecurity:    Worry: Not on file    Inability: Not on file  . Transportation needs:     Medical: Not on file    Non-medical: Not on file  Tobacco Use  . Smoking status: Current Every Day Smoker    Packs/day: 0.25    Years: 10.00    Pack years: 2.50    Types: Cigarettes, Cigars  . Smokeless tobacco: Never Used  Substance and Sexual Activity  . Alcohol use: Yes    Alcohol/week: 1.8 oz    Types: 3 Cans of beer per week    Comment: once a week a 40 oz.   . Drug use: No    Types: Marijuana, Cocaine    Comment: occasionally  . Sexual activity: Yes    Birth control/protection: Condom  Lifestyle  . Physical activity:    Days per week: Not on file    Minutes per session: Not on file  . Stress: Not on file  Relationships  . Social connections:    Talks on phone: Not on file    Gets together: Not on file    Attends religious service: Not on file    Active member of club or organization: Not on file    Attends meetings of clubs or organizations: Not on file    Relationship status: Not on file  Other Topics Concern  . Not on file  Social History Narrative  . Not on file    Hospital Course:  Mylia Pondexter was admitted for Schizoaffective disorder, bipolar type [facility]) and crisis management.  Pt was treated discharged with the medications listed below under Medication List.  Medical problems were identified and treated as needed.  Home medications were restarted as appropriate.  Improvement was monitored by observation and [name] 's daily report of symptom reduction.  Emotional and mental status was monitored by daily self-inventory reports completed by [name] and clinical staff.         [name] was evaluated by the treatment team for stability and plans for continued recovery upon discharge. Denver Harder 's motivation was an integral factor for scheduling further treatment. Employment, transportation, bed availability, health status, family support, and any pending legal issues were also considered during hospital stay. Pt was offered  further treatment options upon discharge including but not limited to Residential, Intensive Outpatient, and Outpatient treatment.  Levern Kalka will follow up with the services as listed below under Follow Up Information.     Upon completion of this admission the patient was both mentally and medically stable for discharge denying suicidal, homicidal ideation, auditory or visual hallucinations. Patient will continue to work on her sleeping hygiene.   [name] responded well to treatment with Seroquel 400 mg and group session without adverse effects.  Pt demonstrated improvement without reported or observed adverse effects to the point of stability appropriate for outpatient management. Pertinent labs include: lipid panel, A1cfor  which outpatient follow-up is necessary for lab recheck as mentioned below. Reviewed CBC, CMP, BAL, and UDS; all unremarkable aside from noted exceptions.  Physical Findings: AIMS: Facial and Oral Movements Muscles of Facial Expression: None, normal Lips and Perioral Area: None, normal Jaw: None, normal Tongue: None, normal,Extremity Movements Upper (arms, wrists, hands, fingers): None, normal Lower (legs, knees, ankles, toes): None, normal, Trunk Movements Neck, shoulders, hips: None, normal, Overall Severity Severity of abnormal movements (highest score from questions above): None, normal Incapacitation due to abnormal movements: None, normal Patient's awareness of abnormal movements (rate only patient's report): No Awareness, Dental Status Current problems with teeth and/or dentures?: No Does patient usually wear dentures?: No  CIWA:  CIWA-Ar Total: 1 COWS:  COWS Total Score: 2  Musculoskeletal: Strength & Muscle Tone: within normal limits Gait & Station: normal Patient leans: N/A  Psychiatric Specialty Exam: See SRA by MD Physical Exam  Vitals reviewed. Constitutional: She is oriented to person, place, and time. She appears well-developed.   Cardiovascular: Normal rate.  Neurological: She is alert and oriented to person, place, and time.  Psychiatric: She has a normal mood and affect. Her behavior is normal.    Review of Systems  Psychiatric/Behavioral: Negative for depression (stable) and suicidal ideas. Hallucinations: stable. The patient is not nervous/anxious. Insomnia: improving    All other systems reviewed and are negative.   Blood pressure 123/73, pulse 72, temperature 97.8 F (36.6 C), temperature source Oral, resp. rate 16, height 5' (1.524 m), weight 47.6 kg (105 lb).Body mass index is 20.51 kg/m.   Have you used any form of tobacco in the last 30 days? (Cigarettes, Smokeless Tobacco, Cigars, and/or Pipes): Yes  Has this patient used any form of tobacco in the last 30 days? (Cigarettes, Smokeless Tobacco, Cigars, and/or Pipes) Yes, Yes, A prescription for an FDA-approved tobacco cessation medication was offered at discharge and the patient refused  Blood Alcohol level:  Lab Results  Component Value Date   [facility] 04/02/2018   ETH <5 09/03/2016    Metabolic Disorder Labs:  Lab Results  Component Value Date   HGBA1C 5.7 (H) 04/07/2018   MPG 116.89 04/07/2018   MPG 123 (H) 02/15/2016   Lab Results  Component Value Date   PROLACTIN 14.3 09/23/2015   PROLACTIN 14.1 10/03/2013   Lab Results  Component Value Date   CHOL 219 (H) 04/07/2018   TRIG 177 (H) 04/07/2018   HDL 98 04/07/2018   CHOLHDL 2.2 04/07/2018   VLDL 35 04/07/2018   LDLCALC 86 04/07/2018   LDLCALC 56 02/15/2016    See Psychiatric Specialty Exam and Suicide Risk Assessment completed by Attending Physician prior to discharge.  Discharge destination:  Home  Is patient on multiple antipsychotic therapies at discharge:  No   Has Patient had three or more failed trials of antipsychotic monotherapy by history:  No  Recommended Plan for Multiple Antipsychotic Therapies: NA  Discharge Instructions    Diet - low sodium heart healthy    Complete by:  As directed    Discharge instructions   Complete by:  As directed    Take all medications as prescribed. Keep all follow-up appointments as scheduled.  Do not consume alcohol or use illegal drugs while on prescription medications. Report any adverse effects from your medications to your primary care provider promptly.  In the event of recurrent symptoms or worsening symptoms, call 911, a crisis hotline, or go to the nearest emergency department for evaluation.   Increase activity slowly  Complete by:  As directed      Allergies as of 04/08/2018   No Known Allergies     Medication List    STOP taking these medications   lamoTRIgine 25 MG tablet Commonly known as:  LAMICTAL   QUEtiapine 400 MG tablet Commonly known as:  SEROQUEL     TAKE these medications     Indication  ENSURE PLUS Liqd Take 237 mLs by mouth 2 (two) times daily between meals.  Indication:  ensur   hydrOXYzine 50 MG tablet Commonly known as:  ATARAX/VISTARIL Take 1 tablet (50 mg total) by mouth every 6 (six) hours as needed (mild/moderate anxiety).  Indication:  Feeling Anxious   metroNIDAZOLE 500 MG tablet Commonly known as:  FLAGYL Take 1 tablet (500 mg total) by mouth every 12 (twelve) hours. What changed:  when to take this  Indication:  Vaginosis caused by Bacteria   multivitamin with minerals Tabs tablet Take 1 tablet by mouth daily.  Indication:  mult   nicotine polacrilex 2 MG gum Commonly known as:  NICORETTE Take 1 each (2 mg total) by mouth as needed for smoking cessation.  Indication:  Nicotine Addiction      Follow-up Information    [facility] Follow up on 04/09/2018.   Why:  Tuesday at 8AM with Nicole Cella for your hospital follow up appointment.  Bring along ID, insurance card and hospital d/c paperwork Contact information: [address] [place] [address] [phone]           Follow-up recommendations:  Activity:  as tolerated Diet:  heart healthy    Comments:  Take all medications as prescribed. Keep all follow-up appointments as scheduled.  Do not consume alcohol or use illegal drugs while on prescription medications. Report any adverse effects from your medications to your primary care provider promptly.  In the event of recurrent symptoms or worsening symptoms, call 911, a crisis hotline, or go to the nearest emergency department for evaluation.   Signed: Oneta Rack, NP 04/08/2018, 9:00 AM   Patient seen, Suicide Assessment Completed.  Disposition Plan Reviewed

## 2018-04-08 NOTE — Progress Notes (Signed)
Pt reports she had a good day.  She denies SI/HI/AVH at this time.  She denies having any withdrawal symptoms.  She c/o a headache this evening and received Tylenol 650 mg with her other scheduled meds.  Pt was pleasant and cooperative.  Pt makes her needs known to staff.  Support and encouragement offered.  Discharge plans are in process.  Safety maintained with q15 minute checks.

## 2018-04-09 LAB — RPR: RPR Ser Ql: NONREACTIVE

## 2018-04-09 LAB — HIV ANTIBODY (ROUTINE TESTING W REFLEX): HIV SCREEN 4TH GENERATION: NONREACTIVE

## 2018-04-09 LAB — HEPATITIS C ANTIBODY: HCV Ab: <0.1 s/co ratio (ref 0.0–0.9)

## 2018-04-09 LAB — HEPATITIS B SURFACE ANTIGEN: Hepatitis B Surface Ag: NEGATIVE

## 2018-04-09 LAB — PROLACTIN: PROLACTIN: 8.9 ng/mL (ref 4.8–23.3)

## 2018-05-31 ENCOUNTER — Encounter: Payer: Self-pay | Admitting: Family Medicine

## 2018-05-31 ENCOUNTER — Ambulatory Visit (INDEPENDENT_AMBULATORY_CARE_PROVIDER_SITE_OTHER): Payer: Medicare Other | Admitting: Family Medicine

## 2018-05-31 VITALS — BP 134/69 | HR 66 | Temp 98.3°F | Resp 14 | Ht 60.0 in | Wt 115.0 lb

## 2018-05-31 DIAGNOSIS — M5442 Lumbago with sciatica, left side: Secondary | ICD-10-CM | POA: Diagnosis not present

## 2018-05-31 DIAGNOSIS — F250 Schizoaffective disorder, bipolar type: Secondary | ICD-10-CM | POA: Diagnosis not present

## 2018-05-31 DIAGNOSIS — Z202 Contact with and (suspected) exposure to infections with a predominantly sexual mode of transmission: Secondary | ICD-10-CM

## 2018-05-31 DIAGNOSIS — F17200 Nicotine dependence, unspecified, uncomplicated: Secondary | ICD-10-CM

## 2018-05-31 DIAGNOSIS — G8929 Other chronic pain: Secondary | ICD-10-CM | POA: Diagnosis not present

## 2018-05-31 DIAGNOSIS — N949 Unspecified condition associated with female genital organs and menstrual cycle: Secondary | ICD-10-CM | POA: Diagnosis not present

## 2018-05-31 LAB — POCT URINALYSIS DIPSTICK
Bilirubin, UA: NEGATIVE
Glucose, UA: NEGATIVE
KETONES UA: NEGATIVE
LEUKOCYTES UA: NEGATIVE
NITRITE UA: NEGATIVE
PH UA: 5.5 (ref 5.0–8.0)
PROTEIN UA: NEGATIVE
RBC UA: NEGATIVE
SPEC GRAV UA: 1.025 (ref 1.010–1.025)
UROBILINOGEN UA: 0.2 U/dL

## 2018-05-31 MED ORDER — GABAPENTIN 100 MG PO CAPS: 100.0000 mg | ORAL_CAPSULE | Freq: Three times a day (TID) | ORAL | 1 refills | Status: DC

## 2018-05-31 NOTE — Progress Notes (Signed)
Chief Complaint  Patient presents with  . Hip Pain    off an on pain that goes down left leg   . Leg Pain    Subjective:    Patient ID: [name], female    DOB: 1961-03-03, 57 y.o.   MRN: [id number]  HPI [name], a 57 year old female with a history of schizophrenia presents complaining of left lower back pain radiating to left leg. She says that pain has been present for greater than 1 month. She denies previous back injury. She characterizes pain as shooting and intermittent. Pain is aggravated by prolonged standing and walking long distances. Current pain intensity is 4/10. She has attempted OTC analgesics without sustained relief. [name] is also requesting STD testing. She is currently sexually active without barrier protection. She endorses vaginal discharge and vaginal burning. She denies fatigue, vaginal itching, dyspareunia, dysuria, nausea, vomiting, or diarrhea.  Patient continues to follow up with behavorial health for schizophrenia. She is taking anti-psychotic medications consistently. She denies visual or auditory hallucination. She also denies suicidal or homicidal ideations.   Past Medical History:  Diagnosis Date  . Bipolar 1 disorder (HCC)   . Depression   . Drug abuse (HCC)   . Schizophrenia [facility](HCC)    Social History   Socioeconomic History  . Marital status: Single    Spouse name: Not on file  . Number of children: Not on file  . Years of education: Not on file  . Highest education level: Not on file  Occupational History  . Not on file  Social Needs  . Financial resource strain: Not on file  . Food insecurity:    Worry: Not on file    Inability: Not on file  . Transportation needs:    Medical: Not on file    Non-medical: Not on file  Tobacco Use  . Smoking status: Current Every Day Smoker    Packs/day: 0.25    Years: 10.00    Pack years: 2.50    Types: Cigarettes, Cigars  . Smokeless tobacco: Never Used  Substance and Sexual  Activity  . Alcohol use: Yes    Alcohol/week: 1.8 oz    Types: 3 Cans of beer per week    Comment: once a week a 40 oz.   . Drug use: No    Types: Marijuana, Cocaine    Comment: occasionally  . Sexual activity: Yes    Birth control/protection: Condom  Lifestyle  . Physical activity:    Days per week: Not on file    Minutes per session: Not on file  . Stress: Not on file  Relationships  . Social connections:    Talks on phone: Not on file    Gets together: Not on file    Attends religious service: Not on file    Active member of club or organization: Not on file    Attends meetings of clubs or organizations: Not on file    Relationship status: Not on file  . Intimate partner violence:    Fear of current or ex partner: Not on file    Emotionally abused: Not on file    Physically abused: Not on file    Forced sexual activity: Not on file  Other Topics Concern  . Not on file  Social History Narrative  . Not on file    There is no immunization history on file for this patient. No Known Allergies   Review of Systems  Constitutional: Negative.   HENT:  Negative.   Eyes: Negative.   Respiratory: Negative.   Cardiovascular: Negative.   Endocrine: Negative.   Genitourinary: Negative.   Musculoskeletal: Positive for arthralgias and back pain.  Allergic/Immunologic: Negative.   Neurological: Negative.   Hematological: Negative.   Psychiatric/Behavioral: Negative.        Objective:   Physical Exam  Constitutional: She is oriented to person, place, and time. She appears well-developed and well-nourished.  HENT:  Head: Normocephalic and atraumatic.  Eyes: Pupils are equal, round, and reactive to light.  Neck: Normal range of motion.  Cardiovascular: Normal rate, regular rhythm and normal heart sounds.  Pulmonary/Chest: Effort normal and breath sounds normal.  Abdominal: Soft. Bowel sounds are normal.  Musculoskeletal: Normal range of motion.  Neurological: She is  alert and oriented to person, place, and time.  Skin: Skin is warm and dry.  Psychiatric: She has a normal mood and affect. Her behavior is normal. Judgment and thought content normal.      BP 134/69 (BP Location: Left Arm, Patient Position: Sitting, Cuff Size: Normal)   Pulse 66   Temp 98.3 F (36.8 C) (Oral)   Resp 14   Ht 5' (1.524 m)   Wt 115 lb (52.2 kg)   SpO2 100%   BMI 22.46 kg/m  Assessment & Plan:  1. Chronic bilateral low back pain with left-sided sciatica Will start a trial of gabapentin 100 mg three times daily - gabapentin (NEURONTIN) 100 MG capsule; Take 1 capsule (100 mg total) by mouth 3 (three) times daily.  Dispense: 90 capsule; Refill: 1 - Urinalysis Dipstick  2. Tobacco dependence Smoking cessation instruction/counseling given:  counseled patient on the dangers of tobacco use, advised patient to stop smoking, and reviewed strategies to maximize success  3. Vaginal burning - Vaginitis/Vaginosis, DNA Probe  4. Possible exposure to STD - RPR - HIV antibody (with reflex) - GC/Chlamydia Probe Amp([facility])  5. Schizoaffective disorder, bipolar type (HCC) Continue to follow up with behavorial health as scheduled   RTC: 6 months for chronic conditions or as needed   Nolon Nations  MSN, FNP-C [facility] [address] [place], [place] [address] [phone]

## 2018-05-31 NOTE — Patient Instructions (Signed)
Gabapentin capsules or tablets What is this medicine? GABAPENTIN (GA ba pen tin) is used to control partial seizures in adults with epilepsy. It is also used to treat certain types of nerve pain. This medicine may be used for other purposes; ask your health care provider or pharmacist if you have questions. COMMON BRAND NAME(S): Active-PAC with Gabapentin, Gabarone, Neurontin What should I tell my health care provider before I take this medicine? They need to know if you have any of these conditions: -kidney disease -suicidal thoughts, plans, or attempt; a previous suicide attempt by you or a family member -an unusual or allergic reaction to gabapentin, other medicines, foods, dyes, or preservatives -pregnant or trying to get pregnant -breast-feeding How should I use this medicine? Take this medicine by mouth with a glass of water. Follow the directions on the prescription label. You can take it with or without food. If it upsets your stomach, take it with food.Take your medicine at regular intervals. Do not take it more often than directed. Do not stop taking except on your doctor's advice. If you are directed to break the 600 or 800 mg tablets in half as part of your dose, the extra half tablet should be used for the next dose. If you have not used the extra half tablet within 28 days, it should be thrown away. A special MedGuide will be given to you by the pharmacist with each prescription and refill. Be sure to read this information carefully each time. Talk to your pediatrician regarding the use of this medicine in children. Special care may be needed. Overdosage: If you think you have taken too much of this medicine contact a poison control center or emergency room at once. NOTE: This medicine is only for you. Do not share this medicine with others. What if I miss a dose? If you miss a dose, take it as soon as you can. If it is almost time for your next dose, take only that dose. Do not  take double or extra doses. What may interact with this medicine? Do not take this medicine with any of the following medications: -other gabapentin products This medicine may also interact with the following medications: -alcohol -antacids -antihistamines for allergy, cough and cold -certain medicines for anxiety or sleep -certain medicines for depression or psychotic disturbances -homatropine; hydrocodone -naproxen -narcotic medicines (opiates) for pain -phenothiazines like chlorpromazine, mesoridazine, prochlorperazine, thioridazine This list may not describe all possible interactions. Give your health care provider a list of all the medicines, herbs, non-prescription drugs, or dietary supplements you use. Also tell them if you smoke, drink alcohol, or use illegal drugs. Some items may interact with your medicine. What should I watch for while using this medicine? Visit your doctor or health care professional for regular checks on your progress. You may want to keep a record at home of how you feel your condition is responding to treatment. You may want to share this information with your doctor or health care professional at each visit. You should contact your doctor or health care professional if your seizures get worse or if you have any new types of seizures. Do not stop taking this medicine or any of your seizure medicines unless instructed by your doctor or health care professional. Stopping your medicine suddenly can increase your seizures or their severity. Wear a medical identification bracelet or chain if you are taking this medicine for seizures, and carry a card that lists all your medications. You may get drowsy, dizzy,   or have blurred vision. Do not drive, use machinery, or do anything that needs mental alertness until you know how this medicine affects you. To reduce dizzy or fainting spells, do not sit or stand up quickly, especially if you are an older patient. Alcohol can  increase drowsiness and dizziness. Avoid alcoholic drinks. Your mouth may get dry. Chewing sugarless gum or sucking hard candy, and drinking plenty of water will help. The use of this medicine may increase the chance of suicidal thoughts or actions. Pay special attention to how you are responding while on this medicine. Any worsening of mood, or thoughts of suicide or dying should be reported to your health care professional right away. Women who become pregnant while using this medicine may enroll in the [place] American Antiepileptic Drug Pregnancy Registry by calling 1-888-233-2334. This registry collects information about the safety of antiepileptic drug use during pregnancy. What side effects may I notice from receiving this medicine? Side effects that you should report to your doctor or health care professional as soon as possible: -allergic reactions like skin rash, itching or hives, swelling of the face, lips, or tongue -worsening of mood, thoughts or actions of suicide or dying Side effects that usually do not require medical attention (report to your doctor or health care professional if they continue or are bothersome): -constipation -difficulty walking or controlling muscle movements -dizziness -nausea -slurred speech -tiredness -tremors -weight gain This list may not describe all possible side effects. Call your doctor for medical advice about side effects. You may report side effects to FDA at 1-800-FDA-1088. Where should I keep my medicine? Keep out of reach of children. This medicine may cause accidental overdose and death if it taken by other adults, children, or pets. Mix any unused medicine with a substance like cat litter or coffee grounds. Then throw the medicine away in a sealed container like a sealed bag or a coffee can with a lid. Do not use the medicine after the expiration date. Store at room temperature between 15 and 30 degrees C (59 and 86 degrees F). NOTE: This  sheet is a summary. It may not cover all possible information. If you have questions about this medicine, talk to your doctor, pharmacist, or health care provider.  2018 Elsevier/Gold Standard (2014-02-06 15:26:50)  

## 2018-06-01 LAB — HIV ANTIBODY (ROUTINE TESTING W REFLEX): HIV Screen 4th Generation wRfx: NONREACTIVE

## 2018-06-01 LAB — RPR: RPR Ser Ql: NONREACTIVE

## 2018-06-03 ENCOUNTER — Other Ambulatory Visit: Payer: Self-pay | Admitting: Family Medicine

## 2018-06-03 ENCOUNTER — Telehealth: Payer: Self-pay

## 2018-06-03 DIAGNOSIS — B9689 Other specified bacterial agents as the cause of diseases classified elsewhere: Secondary | ICD-10-CM

## 2018-06-03 DIAGNOSIS — N760 Acute vaginitis: Principal | ICD-10-CM

## 2018-06-03 LAB — VAGINITIS/VAGINOSIS, DNA PROBE
Candida Species: NEGATIVE
Gardnerella vaginalis: POSITIVE — AB
TRICHOMONAS VAG: NEGATIVE

## 2018-06-03 LAB — GC/CHLAMYDIA PROBE AMP
CHLAMYDIA, DNA PROBE: NEGATIVE
NEISSERIA GONORRHOEAE BY PCR: NEGATIVE

## 2018-06-03 MED ORDER — METRONIDAZOLE 500 MG PO TABS: 500.0000 mg | ORAL_TABLET | Freq: Two times a day (BID) | ORAL | 0 refills | Status: DC

## 2018-06-03 NOTE — Telephone Encounter (Signed)
Called, no answer. Left a message for patient to call back. Thanks!  

## 2018-06-03 NOTE — Telephone Encounter (Signed)
Patient returned call and I advised that vaginal swab yielded bacterial vaginitis. Advised that we will start Metronidazole 500mg  twice daily for 7 days and advised to refrain from drinking alcohol while on medication. Patient verbalized understanding. Thanks!

## 2018-06-03 NOTE — Progress Notes (Signed)
Meds ordered this encounter  Medications  . metroNIDAZOLE (FLAGYL) 500 MG tablet    Sig: Take 1 tablet (500 mg total) by mouth 2 (two) times daily.    Dispense:  14 tablet    Refill:  0     Ranada Vigorito Moore Tysean Vandervliet  MSN, FNP-C [facility] [address]  [place], [place] [address] [phone]  

## 2018-06-03 NOTE — Telephone Encounter (Signed)
-----   Message from Massie MaroonLachina M Hollis, [place] sent at 06/03/2018  2:03 PM EDT ----- Regarding: lab results Please inform patient that vaginal swab yielded bacterial vaginitis.  Will start Metronidazole 500 mg BID for 7 days. Refrain from alcohol while taking metronidazole.    Nolon NationsLachina Moore Hollis  MSN, FNP-C Patient Care [facility] [address]  [place], [place] [id number] [phone]

## 2018-08-16 ENCOUNTER — Other Ambulatory Visit: Payer: Self-pay | Admitting: Internal Medicine

## 2018-08-16 DIAGNOSIS — Z1231 Encounter for screening mammogram for malignant neoplasm of breast: Secondary | ICD-10-CM

## 2018-09-11 ENCOUNTER — Ambulatory Visit: Payer: Self-pay

## 2018-10-22 ENCOUNTER — Encounter (HOSPITAL_COMMUNITY): Payer: Self-pay | Admitting: Emergency Medicine

## 2018-10-22 ENCOUNTER — Emergency Department (HOSPITAL_COMMUNITY)
Admission: EM | Admit: 2018-10-22 | Discharge: 2018-10-22 | Disposition: A | Discharge status: Home / Self Care | Payer: Medicare Other | Attending: Emergency Medicine | Admitting: Emergency Medicine

## 2018-10-22 ENCOUNTER — Other Ambulatory Visit: Payer: Self-pay

## 2018-10-22 DIAGNOSIS — F17210 Nicotine dependence, cigarettes, uncomplicated: Secondary | ICD-10-CM | POA: Insufficient documentation

## 2018-10-22 DIAGNOSIS — M5440 Lumbago with sciatica, unspecified side: Secondary | ICD-10-CM | POA: Diagnosis not present

## 2018-10-22 DIAGNOSIS — R202 Paresthesia of skin: Secondary | ICD-10-CM | POA: Diagnosis not present

## 2018-10-22 DIAGNOSIS — Z79899 Other long term (current) drug therapy: Secondary | ICD-10-CM | POA: Diagnosis not present

## 2018-10-22 DIAGNOSIS — M545 Low back pain: Secondary | ICD-10-CM | POA: Diagnosis present

## 2018-10-22 MED ORDER — NAPROXEN 500 MG PO TABS: 500.0000 mg | ORAL_TABLET | Freq: Two times a day (BID) | ORAL | 0 refills | Status: DC

## 2018-10-22 MED ORDER — METHOCARBAMOL 500 MG PO TABS: 500.0000 mg | ORAL_TABLET | Freq: Two times a day (BID) | ORAL | 0 refills | Status: AC

## 2018-10-22 MED ORDER — PREDNISONE 20 MG PO TABS: 40.0000 mg | ORAL_TABLET | Freq: Every day | ORAL | 0 refills | Status: AC
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
## 2018-10-22 NOTE — Discharge Instructions (Addendum)
I have prescribed muscle relaxers for your pain, please do not drink or drive while taking this medications as they can make you drowsy.    I have also prescribed steroids, he is to be advised this medication can cause insomnia, appetite changes.  These follow-up with PCP in 1 week for reevaluation of your symptoms.  You experience any bowel or bladder incontinence, fever, worsening in your symptoms please return to the ED.  

## 2018-11-05 ENCOUNTER — Inpatient Hospital Stay (HOSPITAL_COMMUNITY)
Admission: RE | Admit: 2018-11-05 | Discharge: 2018-11-11 | DRG: 897 | Disposition: A | Discharge status: Home / Self Care | Payer: Medicare Other | Attending: Psychiatry | Admitting: Psychiatry

## 2018-11-05 ENCOUNTER — Encounter (HOSPITAL_COMMUNITY): Payer: Self-pay | Admitting: *Deleted

## 2018-11-05 ENCOUNTER — Emergency Department (HOSPITAL_COMMUNITY)
Admission: EM | Admit: 2018-11-05 | Discharge: 2018-11-05 | Disposition: A | Discharge status: Other Acute Inpatient Hospital | Payer: Medicare Other | Source: Home / Self Care | Attending: Emergency Medicine | Admitting: Emergency Medicine

## 2018-11-05 ENCOUNTER — Other Ambulatory Visit: Payer: Self-pay

## 2018-11-05 DIAGNOSIS — F419 Anxiety disorder, unspecified: Secondary | ICD-10-CM | POA: Diagnosis present

## 2018-11-05 DIAGNOSIS — R45851 Suicidal ideations: Secondary | ICD-10-CM

## 2018-11-05 DIAGNOSIS — F17210 Nicotine dependence, cigarettes, uncomplicated: Secondary | ICD-10-CM

## 2018-11-05 DIAGNOSIS — F259 Schizoaffective disorder, unspecified: Secondary | ICD-10-CM | POA: Diagnosis present

## 2018-11-05 DIAGNOSIS — F1220 Cannabis dependence, uncomplicated: Secondary | ICD-10-CM | POA: Diagnosis not present

## 2018-11-05 DIAGNOSIS — F1910 Other psychoactive substance abuse, uncomplicated: Secondary | ICD-10-CM | POA: Insufficient documentation

## 2018-11-05 DIAGNOSIS — F251 Schizoaffective disorder, depressive type: Secondary | ICD-10-CM

## 2018-11-05 DIAGNOSIS — F250 Schizoaffective disorder, bipolar type: Secondary | ICD-10-CM | POA: Diagnosis present

## 2018-11-05 DIAGNOSIS — F1420 Cocaine dependence, uncomplicated: Secondary | ICD-10-CM | POA: Diagnosis present

## 2018-11-05 DIAGNOSIS — Z79899 Other long term (current) drug therapy: Secondary | ICD-10-CM

## 2018-11-05 HISTORY — DX: Anxiety disorder, unspecified: F41.9

## 2018-11-05 HISTORY — DX: Headache: R51

## 2018-11-05 HISTORY — DX: Headache, unspecified: R51.9

## 2018-11-05 LAB — COMPREHENSIVE METABOLIC PANEL
ALK PHOS: 66 U/L (ref 38–126)
ALT: 20 U/L (ref 0–44)
AST: 39 U/L (ref 15–41)
Albumin: 4 g/dL (ref 3.5–5.0)
Anion gap: 10 (ref 5–15)
BUN: 16 mg/dL (ref 6–20)
CHLORIDE: 101 mmol/L (ref 98–111)
CO2: 27 mmol/L (ref 22–32)
CREATININE: 1.07 mg/dL — AB (ref 0.44–1.00)
Calcium: 8.7 mg/dL — ABNORMAL LOW (ref 8.9–10.3)
GFR calc Af Amer: >60 mL/min (ref >60)
GFR calc non Af Amer: 57 mL/min — ABNORMAL LOW (ref >60)
Glucose, Bld: 94 mg/dL (ref 70–99)
Potassium: 4.3 mmol/L (ref 3.5–5.1)
SODIUM: 138 mmol/L (ref 135–145)
Total Bilirubin: 1.1 mg/dL (ref 0.3–1.2)
Total Protein: 6.9 g/dL (ref 6.5–8.1)

## 2018-11-05 LAB — CBC WITH DIFFERENTIAL/PLATELET
Abs Immature Granulocytes: 0.05 10*3/uL (ref 0.00–0.07)
BASOS ABS: 0.1 10*3/uL (ref 0.0–0.1)
Basophils Relative: 1 %
EOS PCT: 5 %
Eosinophils Absolute: 0.3 10*3/uL (ref 0.0–0.5)
HEMATOCRIT: 47.6 % — AB (ref 36.0–46.0)
Hemoglobin: 15.3 g/dL — ABNORMAL HIGH (ref 12.0–15.0)
IMMATURE GRANULOCYTES: 1 %
LYMPHS ABS: 1.8 10*3/uL (ref 0.7–4.0)
LYMPHS PCT: 32 %
MCH: 29.8 pg (ref 26.0–34.0)
MCHC: 32.1 g/dL (ref 30.0–36.0)
MCV: 92.8 fL (ref 80.0–100.0)
Monocytes Absolute: 0.7 10*3/uL (ref 0.1–1.0)
Monocytes Relative: 12 %
NRBC: 0 % (ref 0.0–0.2)
Neutro Abs: 2.8 10*3/uL (ref 1.7–7.7)
Neutrophils Relative %: 49 %
Platelets: 271 10*3/uL (ref 150–400)
RBC: 5.13 MIL/uL — ABNORMAL HIGH (ref 3.87–5.11)
RDW: 14.5 % (ref 11.5–15.5)
WBC: 5.7 10*3/uL (ref 4.0–10.5)

## 2018-11-05 LAB — URINALYSIS, ROUTINE W REFLEX MICROSCOPIC
BILIRUBIN URINE: NEGATIVE
Bacteria, UA: NONE SEEN
Glucose, UA: NEGATIVE mg/dL
Ketones, ur: 5 mg/dL — AB
Leukocytes, UA: NEGATIVE
NITRITE: NEGATIVE
PROTEIN: NEGATIVE mg/dL
SPECIFIC GRAVITY, URINE: 1.017 (ref 1.005–1.030)
pH: 5 (ref 5.0–8.0)

## 2018-11-05 LAB — RAPID URINE DRUG SCREEN, HOSP PERFORMED
AMPHETAMINES: NOT DETECTED
BARBITURATES: NOT DETECTED
Benzodiazepines: NOT DETECTED
Cocaine: POSITIVE — AB
Opiates: NOT DETECTED
TETRAHYDROCANNABINOL: NOT DETECTED

## 2018-11-05 LAB — I-STAT BETA HCG BLOOD, ED (MC, WL, AP ONLY)

## 2018-11-05 LAB — ACETAMINOPHEN LEVEL: Acetaminophen (Tylenol), Serum: <10 ug/mL — ABNORMAL LOW (ref 10–30)

## 2018-11-05 LAB — ETHANOL

## 2018-11-05 LAB — SALICYLATE LEVEL: Salicylate Lvl: <7 mg/dL (ref 2.8–30.0)

## 2018-11-05 MED ORDER — THIAMINE HCL 100 MG/ML IJ SOLN: 100.0000 mg | Freq: Every day | INTRAMUSCULAR | Status: DC

## 2018-11-05 MED ORDER — GABAPENTIN 100 MG PO CAPS
100.0000 mg | ORAL_CAPSULE | Freq: Three times a day (TID) | ORAL | Status: DC
  Administered 2018-11-06 – 2018-11-07 (×4): 100 mg via ORAL
  Filled 2018-11-05 (×8): qty 1

## 2018-11-05 MED ORDER — QUETIAPINE FUMARATE 100 MG PO TABS
100.0000 mg | ORAL_TABLET | Freq: Every day | ORAL | Status: DC
  Filled 2018-11-05 (×2): qty 1

## 2018-11-05 MED ORDER — VITAMIN B-1 100 MG PO TABS
100.0000 mg | ORAL_TABLET | Freq: Every day | ORAL | Status: DC
  Administered 2018-11-05: 100 mg via ORAL
  Filled 2018-11-05: qty 1

## 2018-11-05 MED ORDER — LORAZEPAM 1 MG PO TABS: 0.0000 mg | ORAL_TABLET | Freq: Four times a day (QID) | ORAL | Status: DC

## 2018-11-05 MED ORDER — LORAZEPAM 1 MG PO TABS: 0.0000 mg | ORAL_TABLET | Freq: Two times a day (BID) | ORAL | Status: DC

## 2018-11-05 MED ORDER — LORAZEPAM 2 MG/ML IJ SOLN: 0.0000 mg | Freq: Two times a day (BID) | INTRAMUSCULAR | Status: DC

## 2018-11-05 MED ORDER — LORAZEPAM 2 MG/ML IJ SOLN
0.0000 mg | Freq: Four times a day (QID) | INTRAMUSCULAR | Status: DC
  Administered 2018-11-05: 2 mg via INTRAVENOUS
  Filled 2018-11-05: qty 1

## 2018-11-05 MED ORDER — HYDROXYZINE HCL 25 MG PO TABS
25.0000 mg | ORAL_TABLET | Freq: Three times a day (TID) | ORAL | Status: DC | PRN
  Administered 2018-11-09: 25 mg via ORAL
  Filled 2018-11-05 (×2): qty 1

## 2018-11-05 NOTE — ED Notes (Signed)
Bed: WA31 Expected date:  Expected time:  Means of arrival:  Comments: No bed  

## 2018-11-05 NOTE — ED Triage Notes (Signed)
[facility]/o [facility] due to the alcohol and cocaine relapse, [facility] treatment recommended after pt is medically cleared.

## 2018-11-05 NOTE — Tx Team (Signed)
Initial Treatment Plan 11/05/2018 6:04 PM [name] [id number]    PATIENT STRESSORS: Medication change or noncompliance Substance abuse   PATIENT STRENGTHS: Ability for insight Average or above average intelligence General fund of knowledge Motivation for treatment/growth   PATIENT IDENTIFIED PROBLEMS: Depression Suicidal thoughts Auditory hallucinations Substance abuse "Get back on my medications"                     DISCHARGE CRITERIA:  Ability to meet basic life and health needs Improved stabilization in mood, thinking, and/or behavior Reduction of life-threatening or endangering symptoms to within safe limits Verbal commitment to aftercare and medication compliance Withdrawal symptoms are absent or subacute and managed without 24-hour nursing intervention  PRELIMINARY DISCHARGE PLAN: Attend aftercare/continuing care group Return to previous living arrangement  PATIENT/FAMILY INVOLVEMENT: This treatment plan has been presented to and reviewed with the patient, [name], and/or family member, .  The patient and family have been given the opportunity to ask questions and make suggestions.  Jericca Russett, [place], [place] 11/05/2018, 6:04 PM

## 2018-11-05 NOTE — H&P (Signed)
Behavioral Health Medical Screening Exam  [name] is an 57 y.o. female patient presents to [facility] as walk in with complaints of suicidal ideation and plan to overdose related to "relapsing on cocaine and alcohol."  Patient reports that she has been clean for 6-7 months before relapsing yesterday; states she had drank about 8 beers and has done an "eight ball" of cocaine.  Patient states that she has been off of her medications for one week and has started hearing voices telling her that she is worthless and to kill herself.    Total Time spent with patient: 30 minutes  Psychiatric Specialty Exam: Physical Exam  Constitutional: She is oriented to person, place, and time. She appears well-developed and well-nourished.  Neck: Normal range of motion. Neck supple.  Respiratory: Effort normal.  Musculoskeletal: Normal range of motion.  Neurological: She is alert and oriented to person, place, and time.  Skin: Skin is warm and dry.  Psychiatric: Her speech is normal. She is actively hallucinating. Cognition and memory are normal. She expresses impulsivity. She exhibits a depressed mood. She expresses suicidal ideation. She expresses suicidal plans.    Review of Systems  Neurological: Seizures: Denies.  Psychiatric/Behavioral: Positive for depression, hallucinations, substance abuse and suicidal ideas.  All other systems reviewed and are negative.   Blood pressure (!) 138/91, pulse 74, temperature 97.8 F (36.6 C), resp. rate 16, SpO2 100 %.There is no height or weight on file to calculate BMI.  General Appearance: Casual  Eye Contact:  Fair  Speech:  Clear and Coherent and Normal Rate  Volume:  Normal  Mood:  Anxious and Depressed  Affect:  Congruent, Depressed and Tearful  Thought Process:  Coherent and Goal Directed  Orientation:  Full (Time, Place, and Person)  Thought Content:  Hallucinations: Auditory Command:  Voices telling her to kill herself Tactile  Suicidal Thoughts:   Yes.  with intent/plan  Homicidal Thoughts:  No  Memory:  Immediate;   Good Recent;   Good Remote;   Good  Judgement:  Fair  Insight:  Lacking  Psychomotor Activity:  Restlessness  Concentration: Concentration: Good and Attention Span: Good  Recall:  Good  Fund of Knowledge:Good  Language: Good  Akathisia:  No  Handed:  Right  AIMS (if indicated):     Assets:  Communication Skills Desire for Improvement Housing Social Support  Sleep:       Musculoskeletal: Strength & Muscle Tone: within normal limits Gait & Station: normal Patient leans: N/A  Blood pressure (!) 138/91, pulse 74, temperature 97.8 F (36.6 C), resp. rate 16, SpO2 100 %.  Recommendations:  Inpatient psychiatric treatment when medically cleared.   Based on my evaluation the patient appears to have an emergency medical condition for which I recommend the patient be transferred to the emergency department for further evaluation.  Lavana Huckeba, NP 11/05/2018, 10:24 AM

## 2018-11-05 NOTE — BH Assessment (Addendum)
[facility] Assessment Progress Note  Per Shuvon Rankin, FNP, this pt requires psychiatric hospitalization at this time.  Malva LimesLinsey Strader, RN, [facility]  However, pt will need to provide a urine sample, and the EDP will need to medically clear pt before pt can be transferred.  This writer spoke to pt and attempted to have her sign consent forms, however, pt was too drowsy and could not be roused.  Pt's nurse has been notified of pt's intended disposition, as well as the obstacles noted above.  She agrees to attempt to resolve them and to notify me when pt is alert.  After consents are signed, original paperwork is to be [facility] along with pt via [facility], and to call report to [phone].  Doylene Canninghomas Chon Buhl, [place] Behavioral Health Coordinator [phone]   Addendum:  Pt has now provided urine sample, and EDP Rolan BuccoMelanie Belfi, MD has medically cleared pt.  Pt is awake and alert, and she has signed Consent for Admission and Treatment, as well as Consent to Release Information [facility].  A notification call has been place [facility].  S[facility].  Pt's nurse has been notified, and agrees to send original paperwork along with pt via [place], and to call report to [phone].  Doylene Canninghomas Savita Runner, [place] Behavioral Health Coordinator [phone]

## 2018-11-05 NOTE — ED Notes (Signed)
[facility] transport on unit [facility] order. Personal property given for transfer.

## 2018-11-05 NOTE — ED Notes (Signed)
ED TO INPATIENT HANDOFF REPORT  Name/Age/Gender [name] 57 y.o. female  Code Status    Code Status Orders  (From admission, onward)         Start     Ordered   11/05/18 1055  Full code  Continuous     11/05/18 1054        Code Status History    Date Active Date Inactive Code Status Order ID Comments User Context   04/02/2018 2156 04/08/2018 1704 Full Code [id number]  Consuello Closs, NP Inpatient   04/02/2018 0204 04/02/2018 2132 Full Code [id number]  Beverely Pace ED   09/03/2016 2216 09/05/2016 1347 Full Code [id number]  Daleen Bo, MD ED   05/17/2016 2053 05/21/2016 1635 Full Code [id number]  Laverle Hobby, PA-C Inpatient   05/17/2016 0020 05/17/2016 1749 Full Code [id number]  Nona Dell, PA-C ED   09/21/2015 2152 09/27/2015 1556 Full Code [id number]  Laverle Hobby, PA-C Inpatient   09/21/2015 1313 09/21/2015 2152 Full Code [id number]  Ottie Glazier, PA-C ED   01/08/2015 1815 01/14/2015 1952 Full Code [id number]  Benjamine Mola, [place] Inpatient   01/07/2015 2219 01/08/2015 1815 Full Code [id number]  Dewaine Oats, PA-C ED   07/23/2014 1723 07/24/2014 1807 Full Code [id number]  Malena Peer, NP Inpatient   07/23/2014 1340 07/23/2014 1723 Full Code [id number]  Malvin Johns, MD ED   07/07/2014 1732 07/14/2014 1816 Full Code [id number]  Earleen Newport, NP Inpatient   07/06/2014 2241 07/07/2014 1732 Full Code [id number]  Harvie Heck, PA-C ED   09/30/2013 1650 09/30/2013 2212 Full Code [id number]  Carlisle Cater, PA-C ED   03/01/2013 1728 03/02/2013 0738 Full Code [id number]  Montine Circle, PA-C ED   01/07/2013 1344 01/08/2013 0047 Full Code [id number]  Leota Jacobsen, MD ED   01/06/2013 2231 01/07/2013 1212 Full Code [id number]  Delora Fuel, MD ED   08/30/2012 2311 08/31/2012 0812 Full Code [id number]  Ezequiel Essex, MD ED   03/03/2012 1132 03/04/2012 1420 Full Code [id number]  Ezequiel Essex, MD ED   12/06/2011 0905 12/07/2011 0131 Full Code [id number]  Blair Heys, MD ED   11/14/2011 1906 11/20/2011 1616 Full Code [id number]  Nicholos Johns, RN Inpatient   11/13/2011 2116 11/14/2011 1751 Full Code [id number]  Monico Blitz ED      Home/SNF/Other Home  Chief Complaint medical clearance   Level of Care/Admitting Diagnosis ED Disposition    None      Medical History Past Medical History:  Diagnosis Date  . Bipolar 1 disorder ([place])   . Depression   . Drug abuse ([place])   . Schizophrenia ([place])     Allergies No Known Allergies  IV Location/Drains/Wounds Patient Lines/Drains/Airways Status   Active Line/Drains/Airways    Name:   Placement date:   Placement time:   Site:   Days:   Peripheral IV 11/05/18 Right Antecubital   11/05/18    1136    Antecubital   less than 1          Labs/Imaging Results for orders placed or performed during the hospital encounter of 11/05/18 (from the past 48 hour(s))  CBC with Differential     Status: Abnormal   Collection Time: 11/05/18 11:36 AM  Result Value Ref Range   WBC 5.7 4.0 - 10.5 K/uL   RBC 5.13 (H) 3.87 - 5.11 MIL/uL   Hemoglobin 15.3 (H) 12.0 - 15.0 g/dL   HCT 47.6 (H) 36.0 - 46.0 %   MCV  92.8 80.0 - 100.0 fL   MCH 29.8 26.0 - 34.0 pg   MCHC 32.1 30.0 - 36.0 g/dL   RDW 14.5 11.5 - 15.5 %   Platelets 271 150 - 400 K/uL   nRBC 0.0 0.0 - 0.2 %   Neutrophils Relative % 49 %   Neutro Abs 2.8 1.7 - 7.7 K/uL   Lymphocytes Relative 32 %   Lymphs Abs 1.8 0.7 - 4.0 K/uL   Monocytes Relative 12 %   Monocytes Absolute 0.7 0.1 - 1.0 K/uL   Eosinophils Relative 5 %   Eosinophils Absolute 0.3 0.0 - 0.5 K/uL   Basophils Relative 1 %   Basophils Absolute 0.1 0.0 - 0.1 K/uL   Immature Granulocytes 1 %   Abs Immature Granulocytes 0.05 0.00 - 0.07 K/uL    Comment: Performed at [facility], [place] [address] [place], [place] [address]  I-Stat beta hCG blood, ED     Status: None   Collection Time: 11/05/18 11:40 AM  Result Value Ref Range   I-stat hCG, quantitative <5.0  <5 mIU/mL   Comment 3            Comment:   GEST. AGE      CONC.  (mIU/mL)   <=1 WEEK        5 - 50     2 WEEKS       50 - 500     3 WEEKS       100 - 10,000     4 WEEKS     1,000 - 30,000        FEMALE AND NON-PREGNANT FEMALE:     LESS THAN 5 mIU/mL   Acetaminophen level     Status: Abnormal   Collection Time: 11/05/18 12:14 PM  Result Value Ref Range   Acetaminophen (Tylenol), Serum <10 (L) 10 - 30 ug/mL    Comment: (NOTE) Therapeutic concentrations vary significantly. A range of 10-30 ug/mL  may be an effective concentration for many patients. However, some  are best treated at concentrations outside of this range. Acetaminophen concentrations >150 ug/mL at 4 hours after ingestion  and >50 ug/mL at 12 hours after ingestion are often associated with  toxic reactions. Performed at [facility], [place] [address] [name], [place] [address]   Ethanol     Status: None   Collection Time: 11/05/18 12:14 PM  Result Value Ref Range   Alcohol, Ethyl (B) <10 <10 mg/dL    Comment: (NOTE) Lowest detectable limit for serum alcohol is 10 mg/dL. For medical purposes only. Performed at [facility], [place] [address] [place], [place] [address]   Comprehensive metabolic panel     Status: Abnormal   Collection Time: 11/05/18 12:14 PM  Result Value Ref Range   Sodium 138 135 - 145 mmol/L   Potassium 4.3 3.5 - 5.1 mmol/L   Chloride 101 98 - 111 mmol/L   CO2 27 22 - 32 mmol/L   Glucose, Bld 94 70 - 99 mg/dL   BUN 16 6 - 20 mg/dL   Creatinine, Ser 1.07 (H) 0.44 - 1.00 mg/dL   Calcium 8.7 (L) 8.9 - 10.3 mg/dL   Total Protein 6.9 6.5 - 8.1 g/dL   Albumin 4.0 3.5 - 5.0 g/dL   AST 39 15 - 41 U/L   ALT 20 0 - 44 U/L   Alkaline Phosphatase 66 38 - 126 U/L   Total Bilirubin 1.1 0.3 - 1.2 mg/dL  GFR calc non Af Amer 57 (L) >60 mL/min   GFR calc Af Amer >60 >60 mL/min    Comment: (NOTE) The eGFR has been calculated using the CKD EPI equation. This  calculation has not been validated in all clinical situations. eGFR's persistently <60 mL/min signify possible Chronic Kidney Disease.    Anion gap 10 5 - 15    Comment: Performed at [facility], [place] [address] [place], [place] [address]  Salicylate level     Status: None   Collection Time: 11/05/18 12:14 PM  Result Value Ref Range   Salicylate Lvl <5.1 2.8 - 30.0 mg/dL    Comment: Performed at [facility], [place] [address] [place],  [address]   No results found. None  Pending Labs Unresulted Labs (From admission, onward)    Start     Ordered   11/05/18 1440  Urinalysis, Routine w reflex microscopic  Once,   R     11/05/18 1439   11/05/18 1348  Urine rapid drug screen (hosp performed)  ONCE - STAT,   R     11/05/18 1347          Vitals/Pain Today's Vitals   11/05/18 1147 11/05/18 1156 11/05/18 1442 11/05/18 1630  BP: (!) 122/96  117/80   Pulse: 71  63   Resp:      Temp:   97.8 F (36.6 C)   TempSrc:   Oral   SpO2:   99%   Weight:    54.4 kg  Height:    5' (1.524 m)  PainSc:  0-No pain      Isolation Precautions No active isolations  Medications Medications  LORazepam (ATIVAN) injection 0-4 mg (2 mg Intravenous Given 11/05/18 1154)    Or  LORazepam (ATIVAN) tablet 0-4 mg ( Oral See Alternative 11/05/18 1154)  LORazepam (ATIVAN) injection 0-4 mg (has no administration in time range)    Or  LORazepam (ATIVAN) tablet 0-4 mg (has no administration in time range)  thiamine (VITAMIN B-1) tablet 100 mg (100 mg Oral Given 11/05/18 1154)    Or  thiamine (B-1) injection 100 mg ( Intravenous See Alternative 11/05/18 1154)    Mobility walks

## 2018-11-05 NOTE — Progress Notes (Signed)
Stephanie BradfordKimberly is a 57 year old female pt admitted on voluntary basis after initially presenting as a walk-in. On admission, she reports that she has been drinking and using drugs and reports that she has not been taking her medications as she should. She did endorse passive SI but able to contract for safety while in the hospital, and does endorse auditory hallucinations. She reports that she has been using cocaine as well as drinking on a daily basis however does not display any overt signs or symptoms of withdrawal at this time. She reports that she wants to get back on some medications while she is here. She reports that she has been staying with some friends and reports that she can go back there after discharge. Stephanie BradfordKimberly was escorted to the unit, oriented to the milieu and safety maintained.
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
## 2018-11-06 DIAGNOSIS — F1220 Cannabis dependence, uncomplicated: Secondary | ICD-10-CM

## 2018-11-06 DIAGNOSIS — F250 Schizoaffective disorder, bipolar type: Secondary | ICD-10-CM

## 2018-11-06 MED ORDER — NICOTINE POLACRILEX 2 MG MT GUM: 2.0000 mg | CHEWING_GUM | OROMUCOSAL | Status: DC | PRN

## 2018-11-06 MED ORDER — ENSURE ENLIVE PO LIQD
237.0000 mL | Freq: Two times a day (BID) | ORAL | Status: DC
  Administered 2018-11-06 – 2018-11-09 (×6): 237 mL via ORAL
  Filled 2018-11-06 (×4): qty 237

## 2018-11-06 MED ORDER — ADULT MULTIVITAMIN W/MINERALS CH
1.0000 | ORAL_TABLET | Freq: Every day | ORAL | Status: DC
  Administered 2018-11-06 – 2018-11-11 (×6): 1 via ORAL
  Filled 2018-11-06 (×8): qty 1

## 2018-11-06 MED ORDER — IBUPROFEN 800 MG PO TABS
800.0000 mg | ORAL_TABLET | Freq: Four times a day (QID) | ORAL | Status: DC | PRN
  Administered 2018-11-06 – 2018-11-11 (×9): 800 mg via ORAL
  Filled 2018-11-06 (×9): qty 1

## 2018-11-06 MED ORDER — QUETIAPINE FUMARATE 25 MG PO TABS
25.0000 mg | ORAL_TABLET | Freq: Every day | ORAL | Status: DC
  Administered 2018-11-06 – 2018-11-11 (×6): 25 mg via ORAL
  Filled 2018-11-06 (×9): qty 1

## 2018-11-06 MED ORDER — QUETIAPINE FUMARATE 200 MG PO TABS
200.0000 mg | ORAL_TABLET | Freq: Every day | ORAL | Status: DC
  Administered 2018-11-06 – 2018-11-10 (×5): 200 mg via ORAL
  Filled 2018-11-06 (×7): qty 1

## 2018-11-06 NOTE — Progress Notes (Signed)
Stephanie BradfordKimberly woke up at 5am.  She was reporting pain in her back that makes it difficult to walk.  Order obtained for Ibuprofen prn which was given.  She continued to report suicidal ideation but is able to contract for safety on the unit.  She continues to hear voices that "tell me to do things."  She did also report that she only sees things in food.  We will continue to monitor the progress towards her goals.

## 2018-11-06 NOTE — [facility] Suicide Risk Assessment (Signed)
[facility] INPATIENT:  Family/Significant Other Suicide Prevention Education  Suicide Prevention Education:  Patient Refusal for Family/Significant Other Suicide Prevention Education: The patient [name] has refused to provide written consent for family/significant other to be provided Family/Significant Other Suicide Prevention Education during admission and/or prior to discharge.  Physician notified.  Marian Sorrowherie Bohaboy, MSW Intern 11/06/2018, 5:13 PM

## 2018-11-06 NOTE — H&P (Signed)
Psychiatric Admission Assessment Adult  Patient Identification: [name] MRN:  [id number]  Date of Evaluation:  11/06/2018  Chief Complaint: Stopped my medicines, went on a crack binge triggering worsening depression & suicidal ideations with plans to overdose on cocaine & alcohol.   Principal Diagnosis: Cocaine use disorder, severe, dependence ([place])  Diagnosis:   Patient Active Problem List   Diagnosis Date Noted  . Cocaine use disorder, severe, dependence ([place]) [F14.20] 09/22/2015    Priority: High  . Schizoaffective disorder, bipolar type ([place]) [F25.0] 05/18/2016    Priority: Medium  . Schizoaffective disorder ([place]) [F25.9] 11/05/2018  . Bacterial vaginosis [N76.0, B96.89] 03/01/2017  . Annual physical exam [Z00.00] 02/15/2016  . Encounter for routine gynecological examination [Z01.419] 02/15/2016  . Breast cancer screening [Z12.39] 02/15/2016  . Affective psychosis, bipolar ([place]) [F31.9]   . Cannabis use disorder, moderate, dependence ([place]) [F12.20] 09/22/2015  . UTI (lower urinary tract infection) [N39.0] 09/04/2012   History of Present Illness: This is one of several admission assessments from this hospital alone for [place], a 57 year old AA female with hx of Cocaine use disorder, chronic, alcohol use disorder & chronic mental illness. She is well known on this unit from previous hospitalizations for mood stabilization treatments/chronic drug use. She receives her maintenance mental health care on an outpatient basis at [facility]. She is currently admitted to the [facility] with complaints of worsening symptoms of depression & suicidal ideation triggered by crack cocaine/alcohol binge with plans to overdose on the crack & alcohol. She is seeking help in resuming her mental health medications.  During this assessment, [name] tearfully reports, "I'm not good. I'm sorry that I relapsed on crack & alcohol again, a week ago to be exact. First, I stopped taking my mental health  medicines a week ago. I also have not been going to my mental health appointments at [facility] in a month. So, I relapsed on crack again. I have not been sleeping well either. When I'm drugs, I don't eat. I have lost a lot of weight in 1 week.  I wanted to overdose on cocaine/alcohol & get it over with, I drank a case of beer yesterday. I need to get back on my medicines (Seroquel 400 at bedtime & 50 mg in the daytime). I'm not having any substance withdrawal symptoms.  Associated Signs/Symptoms: Depression Symptoms:  depressed mood, feelings of worthlessness/guilt,  (Hypo) Manic Symptoms:  Hallucinations, Impulsivity, Irritable Mood, Labiality of Mood,  Anxiety Symptoms:  Excessive Worry, Psychotic Symptoms:  Hallucinations: Auditory  PTSD Symptoms: NA  Total Time spent with patient: 1 hour  Past Psychiatric History: Cocaine use disorder, Schizoaffective disorder.  Is the patient at risk to self? No.  Has the patient been a risk to self in the past 6 months? Yes.    Has the patient been a risk to self within the distant past? Yes.    Is the patient a risk to others? No.  Has the patient been a risk to others in the past 6 months? No.  Has the patient been a risk to others within the distant past? No.   Prior Inpatient Therapy: Yes, numerous times in the past.  Prior Outpatient Therapy: [facility].  Alcohol Screening: 1. How often do you have a drink containing alcohol?: 4 or more times a week 2. How many drinks containing alcohol do you have on a typical day when you are drinking?: 7, 8, or 9 3. How often do you have six or more drinks on one occasion?: Daily  or almost daily AUDIT-C Score: 11 4. How often during the last year have you found that you were not able to stop drinking once you had started?: Daily or almost daily 5. How often during the last year have you failed to do what was normally expected from you becasue of drinking?: Daily or almost daily 6. How often during the  last year have you needed a first drink in the morning to get yourself going after a heavy drinking session?: Daily or almost daily 7. How often during the last year have you had a feeling of guilt of remorse after drinking?: Daily or almost daily 8. How often during the last year have you been unable to remember what happened the night before because you had been drinking?: Daily or almost daily 9. Have you or someone else been injured as a result of your drinking?: Yes, during the last year 10. Has a relative or friend or a doctor or another health worker been concerned about your drinking or suggested you cut down?: Yes, during the last year Alcohol Use Disorder Identification Test Final Score (AUDIT): 39 Intervention/Follow-up: Alcohol Education  Substance Abuse History in the last 12 months:  Yes.    Consequences of Substance Abuse: Medical Consequences:  Liver damage, Possible death by overdose Legal Consequences:  Arrests, jail time, Loss of driving privilege. Family Consequences:  Family discord, divorce and or separation.  Previous Psychotropic Medications: Yes (Seroquel)  Psychological Evaluations: No   Past Medical History:  Past Medical History:  Diagnosis Date  . Anxiety   . Bipolar 1 disorder ([place])   . Depression   . Drug abuse ([place])   . Headache   . Schizophrenia [facility])     Past Surgical History:  Procedure Laterality Date  . NO PAST SURGERIES     Family History:  Family History  Problem Relation Age of Onset  . Mental illness Maternal Aunt   . Cancer Maternal Aunt        breast   . Mental illness Paternal Aunt   . Anesthesia problems Neg Hx   . Hypotension Neg Hx   . Malignant hyperthermia Neg Hx   . Pseudochol deficiency Neg Hx    Family Psychiatric  History: Bipolar disorder: Paternal aunt.  Tobacco Screening: Have you used any form of tobacco in the last 30 days? (Cigarettes, Smokeless Tobacco, Cigars, and/or Pipes): Yes Tobacco use, Select all that  apply: 4 or less cigarettes per day Are you interested in Tobacco Cessation Medications?: No, patient refused Counseled patient on smoking cessation including recognizing danger situations, developing coping skills and basic information about quitting provided: Refused/Declined practical counseling  Social History:  Social History   Substance and Sexual Activity  Alcohol Use Yes  . Alcohol/week: 3.0 standard drinks  . Types: 3 Cans of beer per week   Comment: 8-10 can daily     Social History   Substance and Sexual Activity  Drug Use Yes  . Types: Marijuana, Cocaine   Comment: occasionally    Additional Social History:  Allergies:  No Known Allergies  Lab Results:  Results for orders placed or performed during the hospital encounter of 11/05/18 (from the past 48 hour(s))  CBC with Differential     Status: Abnormal   Collection Time: 11/05/18 11:36 AM  Result Value Ref Range   WBC 5.7 4.0 - 10.5 K/uL   RBC 5.13 (H) 3.87 - 5.11 MIL/uL   Hemoglobin 15.3 (H) 12.0 - 15.0 g/dL   HCT  47.6 (H) 36.0 - 46.0 %   MCV 92.8 80.0 - 100.0 fL   MCH 29.8 26.0 - 34.0 pg   MCHC 32.1 30.0 - 36.0 g/dL   RDW 14.5 11.5 - 15.5 %   Platelets 271 150 - 400 K/uL   nRBC 0.0 0.0 - 0.2 %   Neutrophils Relative % 49 %   Neutro Abs 2.8 1.7 - 7.7 K/uL   Lymphocytes Relative 32 %   Lymphs Abs 1.8 0.7 - 4.0 K/uL   Monocytes Relative 12 %   Monocytes Absolute 0.7 0.1 - 1.0 K/uL   Eosinophils Relative 5 %   Eosinophils Absolute 0.3 0.0 - 0.5 K/uL   Basophils Relative 1 %   Basophils Absolute 0.1 0.0 - 0.1 K/uL   Immature Granulocytes 1 %   Abs Immature Granulocytes 0.05 0.00 - 0.07 K/uL    Comment: Performed at [facility], [place] [address] [place], [place] [address]  I-Stat beta hCG blood, ED     Status: None   Collection Time: 11/05/18 11:40 AM  Result Value Ref Range   I-stat hCG, quantitative <5.0 <5 mIU/mL   Comment 3            Comment:   GEST. AGE      CONC.  (mIU/mL)    <=1 WEEK        5 - 50     2 WEEKS       50 - 500     3 WEEKS       100 - 10,000     4 WEEKS     1,000 - 30,000        FEMALE AND NON-PREGNANT FEMALE:     LESS THAN 5 mIU/mL   Acetaminophen level     Status: Abnormal   Collection Time: 11/05/18 12:14 PM  Result Value Ref Range   Acetaminophen (Tylenol), Serum <10 (L) 10 - 30 ug/mL    Comment: (NOTE) Therapeutic concentrations vary significantly. A range of 10-30 ug/mL  may be an effective concentration for many patients. However, some  are best treated at concentrations outside of this range. Acetaminophen concentrations >150 ug/mL at 4 hours after ingestion  and >50 ug/mL at 12 hours after ingestion are often associated with  toxic reactions. Performed at [facility], [place] [address] [place], [place] [address]   Ethanol     Status: None   Collection Time: 11/05/18 12:14 PM  Result Value Ref Range   Alcohol, Ethyl (B) <10 <10 mg/dL    Comment: (NOTE) Lowest detectable limit for serum alcohol is 10 mg/dL. For medical purposes only. Performed at [facility], [place] [address] [place], [place] [address]   Comprehensive metabolic panel     Status: Abnormal   Collection Time: 11/05/18 12:14 PM  Result Value Ref Range   Sodium 138 135 - 145 mmol/L   Potassium 4.3 3.5 - 5.1 mmol/L   Chloride 101 98 - 111 mmol/L   CO2 27 22 - 32 mmol/L   Glucose, Bld 94 70 - 99 mg/dL   BUN 16 6 - 20 mg/dL   Creatinine, Ser 1.07 (H) 0.44 - 1.00 mg/dL   Calcium 8.7 (L) 8.9 - 10.3 mg/dL   Total Protein 6.9 6.5 - 8.1 g/dL   Albumin 4.0 3.5 - 5.0 g/dL   AST 39 15 - 41 U/L   ALT 20 0 - 44 U/L   Alkaline Phosphatase 66 38 - 126 U/L  Total Bilirubin 1.1 0.3 - 1.2 mg/dL   GFR calc non Af Amer 57 (L) >60 mL/min   GFR calc Af Amer >60 >60 mL/min    Comment: (NOTE) The eGFR has been calculated using the CKD EPI equation. This calculation has not been validated in all clinical situations. eGFR's persistently <60  mL/min signify possible Chronic Kidney Disease.    Anion gap 10 5 - 15    Comment: Performed at [facility], [place] [address], [place], [place] [address]  Salicylate level     Status: None   Collection Time: 11/05/18 12:14 PM  Result Value Ref Range   Salicylate Lvl <6.0 2.8 - 30.0 mg/dL    Comment: Performed at [facility], [place] [address], [place], [place] [address]  Urine rapid drug screen (hosp performed)     Status: Abnormal   Collection Time: 11/05/18  4:16 PM  Result Value Ref Range   Opiates NONE DETECTED NONE DETECTED   Cocaine POSITIVE (A) NONE DETECTED   Benzodiazepines NONE DETECTED NONE DETECTED   Amphetamines NONE DETECTED NONE DETECTED   Tetrahydrocannabinol NONE DETECTED NONE DETECTED   Barbiturates NONE DETECTED NONE DETECTED    Comment: (NOTE) DRUG SCREEN FOR MEDICAL PURPOSES ONLY.  IF CONFIRMATION IS NEEDED FOR ANY PURPOSE, NOTIFY LAB WITHIN 5 DAYS. LOWEST DETECTABLE LIMITS FOR URINE DRUG SCREEN Drug Class                     Cutoff (ng/mL) Amphetamine and metabolites    1000 Barbiturate and metabolites    200 Benzodiazepine                 811 Tricyclics and metabolites     300 Opiates and metabolites        300 Cocaine and metabolites        300 THC                            50 Performed at [facility], [place] [address] [place], [place] [address]   Urinalysis, Routine w reflex microscopic     Status: Abnormal   Collection Time: 11/05/18  4:16 PM  Result Value Ref Range   Color, Urine YELLOW YELLOW   APPearance CLEAR CLEAR   Specific Gravity, Urine 1.017 1.005 - 1.030   pH 5.0 5.0 - 8.0   Glucose, UA NEGATIVE NEGATIVE mg/dL   Hgb urine dipstick SMALL (A) NEGATIVE   Bilirubin Urine NEGATIVE NEGATIVE   Ketones, ur 5 (A) NEGATIVE mg/dL   Protein, ur NEGATIVE NEGATIVE mg/dL   Nitrite NEGATIVE NEGATIVE   Leukocytes, UA NEGATIVE NEGATIVE   RBC / HPF 0-5 0 - 5 RBC/hpf   WBC, UA 0-5 0 - 5 WBC/hpf    Bacteria, UA NONE SEEN NONE SEEN   Mucus PRESENT     Comment: Performed at [facility], [place] [address] [place], [place] [address]   Blood Alcohol level:  Lab Results  Component Value Date   ETH <10 11/05/2018   ETH <10 13/07/6577   Metabolic Disorder Labs:  Lab Results  Component Value Date   HGBA1C 5.7 (H) 04/07/2018   MPG 116.89 04/07/2018   MPG 123 (H) 02/15/2016   Lab Results  Component Value Date   PROLACTIN 8.9 04/07/2018   PROLACTIN 14.3 09/23/2015   Lab Results  Component Value Date   CHOL 219 (H) 04/07/2018   TRIG 177 (H)  04/07/2018   HDL 98 04/07/2018   CHOLHDL 2.2 04/07/2018   VLDL 35 04/07/2018   LDLCALC 86 04/07/2018   LDLCALC 56 02/15/2016   Current Medications: Current Facility-Administered Medications  Medication Dose Route Frequency Provider Last Rate Last Dose  . gabapentin (NEURONTIN) capsule 100 mg  100 mg Oral TID Lindon Romp A, NP   100 mg at 11/06/18 0813  . hydrOXYzine (ATARAX/VISTARIL) tablet 25 mg  25 mg Oral TID PRN Rozetta Nunnery, NP      . ibuprofen (ADVIL,MOTRIN) tablet 800 mg  800 mg Oral Q6H PRN Lindon Romp A, NP   800 mg at 11/06/18 0518  . QUEtiapine (SEROQUEL) tablet 100 mg  100 mg Oral QHS Lindon Romp A, NP       PTA Medications: Medications Prior to Admission  Medication Sig Dispense Refill Last Dose  . ENSURE PLUS (ENSURE PLUS) LIQD Take 237 mLs by mouth 2 (two) times daily between meals.   Past Month at Unknown time  . gabapentin (NEURONTIN) 100 MG capsule Take 1 capsule (100 mg total) by mouth 3 (three) times daily. (Patient not taking: Reported on 11/05/2018) 90 capsule 1 Not Taking at Unknown time  . hydrOXYzine (ATARAX/VISTARIL) 50 MG tablet Take 1 tablet (50 mg total) by mouth every 6 (six) hours as needed (mild/moderate anxiety). (Patient not taking: Reported on 05/31/2018) 30 tablet 0 Not Taking at Unknown time  . metroNIDAZOLE (FLAGYL) 500 MG tablet Take 1 tablet (500 mg total) by mouth 2 (two) times  daily. (Patient not taking: Reported on 11/05/2018) 14 tablet 0 Not Taking at Unknown time  . Multiple Vitamin (MULTIVITAMIN WITH MINERALS) TABS tablet Take 1 tablet by mouth daily.   Past Month at Unknown time  . nicotine polacrilex (NICORETTE) 2 MG gum Take 1 each (2 mg total) by mouth as needed for smoking cessation. (Patient not taking: Reported on 05/31/2018) 100 tablet 0 Not Taking at Unknown time  . QUEtiapine (SEROQUEL) 400 MG tablet Take 1 tablet (400 mg total) by mouth at bedtime. 30 tablet 0 Past Month at Unknown time   Musculoskeletal: Strength & Muscle Tone: within normal limits Gait & Station: normal Patient leans: N/A  Psychiatric Specialty Exam: Physical Exam  Constitutional: She appears well-developed.  HENT:  Head: Normocephalic.  Eyes: Pupils are equal, round, and reactive to light.  Neck: Normal range of motion.  Cardiovascular:  Elevated systolic blood pressure (993/71)  Respiratory: Effort normal.  GI: Soft.  Genitourinary:  Genitourinary Comments: Deferred  Musculoskeletal: Normal range of motion.  Neurological: She is alert.  Skin: Skin is warm.    Review of Systems  Constitutional: Negative.   HENT: Negative.   Eyes: Negative.   Respiratory: Negative.   Cardiovascular: Negative for chest pain, orthopnea, claudication and leg swelling.  Gastrointestinal: Negative for abdominal pain, diarrhea, nausea and vomiting.  Genitourinary: Negative.   Musculoskeletal: Negative.   Skin: Negative.   Neurological: Negative.   Endo/Heme/Allergies: Negative.   Psychiatric/Behavioral: Positive for depression and substance abuse (UDS (+) for Cocaine & THC.). Negative for memory loss. The patient is nervous/anxious and has insomnia.     Blood pressure 114/86, pulse 88, temperature 98.1 F (36.7 C), temperature source Oral, resp. rate 16, height 5' (1.524 m), weight 49 kg, SpO2 100 %.Body mass index is 21.09 kg/m.  General Appearance: Casual and Fairly Groomed  Eye  Contact:  Good  Speech:  Clear and Coherent and Normal Rate  Volume:  Normal  Mood:  Labile  Affect:  Full  Range  Thought Process:  Coherent and Descriptions of Associations: Intact  Orientation:  Full (Time, Place, and Person)  Thought Content:  Logical  Suicidal Thoughts:  Currently denies any thoughts, plans or intent  Homicidal Thoughts:  Denies  Memory:  Immediate;   Good Recent;   Good Remote;   Good  Judgement:  Good  Insight:  Fair  Psychomotor Activity:  Normal  Concentration:  Concentration: Good and Attention Span: Good  Recall:  Good  Fund of Knowledge:  Good  Language:  Good  Akathisia:  Negative  Handed:  Right  AIMS (if indicated):     Assets:  Communication Skills Desire for Improvement Physical Health  ADL's:  Intact  Cognition:  WNL  Sleep:  "I have not been sleeping well at home"   Treatment Plan Summary: Daily contact with patient to assess and evaluate symptoms and progress in treatment: See Md's SRA & Treatment plan.  Observation Level/Precautions:  15 minute checks  Laboratory:  Per ED, UDS (+) for Cocaine & THC.  Psychotherapy: group session   Medications: See MAR   Consultations: As needed    Discharge Concerns: Safety, Mood control  Estimated LOS: 5-7 days  Other:  Admit to the 500-hall    Physician Treatment Plan for Primary Diagnosis: Cocaine use disorder, severe, dependence ([place])  Long Term Goal(s): Improvement in symptoms so as ready for discharge  Short Term Goals: Ability to identify changes in lifestyle to reduce recurrence of condition will improve and Ability to demonstrate self-control will improve  Physician Treatment Plan for Secondary Diagnosis: Principal Problem:   Cocaine use disorder, severe, dependence ([place]) Active Problems:   Schizoaffective disorder, bipolar type ([place])   Schizoaffective disorder ([place])  Long Term Goal(s): Improvement in symptoms so as ready for discharge  Short Term Goals: Ability to identify and  develop effective coping behaviors will improve, Compliance with prescribed medications will improve and Ability to identify triggers associated with substance abuse/mental health issues will improve  I certify that inpatient services furnished can reasonably be expected to improve the patient's condition.    Lindell Spar, NP, PMHNP, FNP-BC 11/13/201912:44 PM

## 2018-11-06 NOTE — Progress Notes (Signed)
Recreation Therapy Notes  INPATIENT RECREATION THERAPY ASSESSMENT  Patient Details Name: Hollice GongKimberly Arneson MRN: [id number] DOB: 1961/01/03 Today's Date: 11/06/2018       Information Obtained From: Patient  Able to Participate in Assessment/Interview: Yes  Patient Presentation: Alert  Reason for Admission (Per Patient): Suicidal Ideation  Patient Stressors: Family, Relationship, Other (Comment)(Life)  Coping Skills:   Isolation, Self-Injury, TV, Arguments, Music, Meditate, Deep Breathing, Substance Abuse, Impulsivity, Prayer, Avoidance, Read, Dance, Hot Bath/Shower  Leisure Interests (2+):  Individual - Reading, Nature - Other (Comment)(Be outside)  Frequency of Recreation/Participation: Monthly  Awareness of Community Resources:  Yes  [employer]Community Resources:  Library, Other (Comment)(Stores)  Current Use: Yes  If no, Barriers?:    Expressed Interest in State Street CorporationCommunity Resource Information: No  Enbridge EnergyCounty of Residence:  [place]  Patient Main Form of Transportation: Therapist, musicublic Transportation  Patient Strengths:  Friendly; Helpful  Patient Identified Areas of Improvement:  Self-esteem; Attitude  Patient Goal for Hospitalization:  "Stay on meds and get rid of suicidal thoughts"  Current SI (including self-harm):  No  Current HI:  No  Current AVH: Yes(Pt rated it a 7 out of 10)  Staff Intervention Plan: Group Attendance, Collaborate with Interdisciplinary Treatment Team  Consent to Intern Participation: N/A    Caroll RancherMarjette Ayodele Sangalang, LRT/CTRS  Lillia AbedLindsay, Eyoel Throgmorton A 11/06/2018, 1:47 PM

## 2018-11-06 NOTE — Progress Notes (Signed)
D:  [name] has been in the bed all shift.  Multiple attempts to have her come up for medications or talking were unsuccessful.  Breathing is even and unlabored.  She appears to be in no physical distress.  She has been noted moving around in the bed.   A:  Q 15 minute checks maintained for safety.  Encouraged participation in group and unit activities.   R:  [name] remains safe on the unit.  We will continue to monitor the progress towards her goals.
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
## 2018-11-06 NOTE — Tx Team (Signed)
Interdisciplinary Treatment and Diagnostic Plan Update  11/06/2018 Time of Session: 0911 [name] MRN: [id number]  Principal Diagnosis: <principal problem not specified>  Secondary Diagnoses: Active Problems:   Schizoaffective disorder (HCC)   Current Medications:  Current Facility-Administered Medications  Medication Dose Route Frequency Provider Last Rate Last Dose  . gabapentin (NEURONTIN) capsule 100 mg  100 mg Oral TID Lindon Romp A, NP   100 mg at 11/06/18 0813  . hydrOXYzine (ATARAX/VISTARIL) tablet 25 mg  25 mg Oral TID PRN Rozetta Nunnery, NP      . ibuprofen (ADVIL,MOTRIN) tablet 800 mg  800 mg Oral Q6H PRN Lindon Romp A, NP   800 mg at 11/06/18 0518  . QUEtiapine (SEROQUEL) tablet 100 mg  100 mg Oral QHS Lindon Romp A, NP       PTA Medications: Medications Prior to Admission  Medication Sig Dispense Refill Last Dose  . ENSURE PLUS (ENSURE PLUS) LIQD Take 237 mLs by mouth 2 (two) times daily between meals.   Past Month at Unknown time  . gabapentin (NEURONTIN) 100 MG capsule Take 1 capsule (100 mg total) by mouth 3 (three) times daily. (Patient not taking: Reported on 11/05/2018) 90 capsule 1 Not Taking at Unknown time  . hydrOXYzine (ATARAX/VISTARIL) 50 MG tablet Take 1 tablet (50 mg total) by mouth every 6 (six) hours as needed (mild/moderate anxiety). (Patient not taking: Reported on 05/31/2018) 30 tablet 0 Not Taking at Unknown time  . metroNIDAZOLE (FLAGYL) 500 MG tablet Take 1 tablet (500 mg total) by mouth 2 (two) times daily. (Patient not taking: Reported on 11/05/2018) 14 tablet 0 Not Taking at Unknown time  . Multiple Vitamin (MULTIVITAMIN WITH MINERALS) TABS tablet Take 1 tablet by mouth daily.   Past Month at Unknown time  . nicotine polacrilex (NICORETTE) 2 MG gum Take 1 each (2 mg total) by mouth as needed for smoking cessation. (Patient not taking: Reported on 05/31/2018) 100 tablet 0 Not Taking at Unknown time  . QUEtiapine (SEROQUEL) 400 MG tablet Take 1  tablet (400 mg total) by mouth at bedtime. 30 tablet 0 Past Month at Unknown time    Patient Stressors: Medication change or noncompliance Substance abuse  Patient Strengths: Ability for insight Average or above average intelligence General fund of knowledge Motivation for treatment/growth  Treatment Modalities: Medication Management, Group therapy, Case management,  1 to 1 session with clinician, Psychoeducation, Recreational therapy.   Physician Treatment Plan for Primary Diagnosis: <principal problem not specified> Long Term Goal(s): Improvement in symptoms so as ready for discharge Improvement in symptoms so as ready for discharge   Short Term Goals: Ability to identify changes in lifestyle to reduce recurrence of condition will improve Ability to demonstrate self-control will improve Ability to identify and develop effective coping behaviors will improve Compliance with prescribed medications will improve Ability to identify triggers associated with substance abuse/mental health issues will improve  Medication Management: Evaluate patient's response, side effects, and tolerance of medication regimen.  Therapeutic Interventions: 1 to 1 sessions, Unit Group sessions and Medication administration.  Evaluation of Outcomes: Not Met  Physician Treatment Plan for Secondary Diagnosis: Active Problems:   Schizoaffective disorder ([place])  Long Term Goal(s): Improvement in symptoms so as ready for discharge Improvement in symptoms so as ready for discharge   Short Term Goals: Ability to identify changes in lifestyle to reduce recurrence of condition will improve Ability to demonstrate self-control will improve Ability to identify and develop effective coping behaviors will improve Compliance with prescribed medications  will improve Ability to identify triggers associated with substance abuse/mental health issues will improve     Medication Management: Evaluate patient's response,  side effects, and tolerance of medication regimen.  Therapeutic Interventions: 1 to 1 sessions, Unit Group sessions and Medication administration.  Evaluation of Outcomes: Not Met   RN Treatment Plan for Primary Diagnosis: <principal problem not specified> Long Term Goal(s): Knowledge of disease and therapeutic regimen to maintain health will improve  Short Term Goals: Ability to identify and develop effective coping behaviors will improve and Compliance with prescribed medications will improve  Medication Management: RN will administer medications as ordered by provider, will assess and evaluate patient's response and provide education to patient for prescribed medication. RN will report any adverse and/or side effects to prescribing provider.  Therapeutic Interventions: 1 on 1 counseling sessions, Psychoeducation, Medication administration, Evaluate responses to treatment, Monitor vital signs and CBGs as ordered, Perform/monitor CIWA, COWS, AIMS and Fall Risk screenings as ordered, Perform wound care treatments as ordered.  Evaluation of Outcomes: Not Met   LCSW Treatment Plan for Primary Diagnosis: <principal problem not specified> Long Term Goal(s): Safe transition to appropriate next level of care at discharge, Engage patient in therapeutic group addressing interpersonal concerns.  Short Term Goals: Engage patient in aftercare planning with referrals and resources, Increase social support and Increase skills for wellness and recovery  Therapeutic Interventions: Assess for all discharge needs, 1 to 1 time with Social worker, Explore available resources and support systems, Assess for adequacy in community support network, Educate family and significant other(s) on suicide prevention, Complete Psychosocial Assessment, Interpersonal group therapy.  Evaluation of Outcomes: Not Met   Progress in Treatment: Attending groups: No. Participating in groups: No. Taking medication as  prescribed: Yes. Toleration medication: Yes. Family/Significant other contact made: No, will contact:  when given permission Patient understands diagnosis: Yes. Discussing patient identified problems/goals with staff: Yes. Medical problems stabilized or resolved: Yes. Denies suicidal/homicidal ideation: Yes. Issues/concerns per patient self-inventory: No. Other: none  New problem(s) identified: No, Describe:  none  New Short Term/Long Term Goal(s):  Patient Goals:  "get drug free, stay on meds, not have suicidal thoughts"  Discharge Plan or Barriers:   Reason for Continuation of Hospitalization: Medication stabilization Other; describe disorganized thinking  Estimated Length of Stay: 3-5 days.   Attendees: Patient:[name] 11/06/2018   Physician: Dr Jake Samples, MD 11/06/2018   Nursing: Neldon [place], RN 11/06/2018   RN Care Manager: 11/06/2018   Social Worker: Lurline Idol, LCSW 11/06/2018   Recreational Therapist:  11/06/2018  Other:  11/06/2018   Other:  11/06/2018   Other: 11/06/2018         Scribe for Treatment Team: Joanne Chars, LCSW 11/06/2018 11:14 AM

## 2018-11-06 NOTE — Progress Notes (Signed)
Did not attend group 
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
## 2018-11-06 NOTE — Plan of Care (Signed)
Progress Note  D: pt found in bed; compliant with medication administration. Pt will wait to come up for meds though, hence her late administrations. Pt has been in bed most of the day. Pt denies any si/hi/ah/vh and verbally agrees to approach staff if these become apparent or before harming herself [facility]. Pt is pleasant and cordial. A: pt provided support and encouragement. Pt given medication per protocol and standing orders. [id number] safety checks implemented and continued.  R: pt safe on the unit. Will continue to monitor.   Pt progressing in the following metrics  Problem: Education: Goal: Knowledge of [place] [facility] Education information/materials will improve Outcome: Progressing Goal: Emotional status will improve Outcome: Progressing Goal: Mental status will improve Outcome: Progressing Goal: Verbalization of understanding the information provided will improve Outcome: Progressing

## 2018-11-07 MED ORDER — GABAPENTIN 300 MG PO CAPS
300.0000 mg | ORAL_CAPSULE | Freq: Three times a day (TID) | ORAL | Status: DC
  Administered 2018-11-07 – 2018-11-11 (×13): 300 mg via ORAL
  Filled 2018-11-07 (×18): qty 1

## 2018-11-07 NOTE — BHH Group Notes (Signed)
B[facility] Group Notes:  (Nursing/MHT/Case Management/Adjunct)  Date:  11/07/2018  Time:  5:05 PM  Type of Therapy:  Psychoeducational Skills  Participation Level:  Did Not Attend   Audrie Lializabeth O Corey Laski 11/07/2018, 5:05 PM

## 2018-11-07 NOTE — Progress Notes (Signed)
[facility] MD Progress Note  11/07/2018 10:18 AM [name]  MRN:  [id number] Subjective:   Patient is seen in her room she reports she would like to be on the Seroquel at 50 mg in the morning and 400 at bedtime she would like to be on gabapentin at a higher dose she would like ensure most of these orders are already in the chart we opted for the lower dose Seroquel in the morning.  No cravings of cocaine no withdrawal reporting some neuropathy type pain however Principal Problem: Cocaine use disorder, severe, dependence ([place]) Diagnosis:   Patient Active Problem List   Diagnosis Date Noted  . Schizoaffective disorder ([place]) [F25.9] 11/05/2018  . Bacterial vaginosis [N76.0, B96.89] 03/01/2017  . Schizoaffective disorder, bipolar type ([place]) [F25.0] 05/18/2016  . Annual physical exam [Z00.00] 02/15/2016  . Encounter for routine gynecological examination [Z01.419] 02/15/2016  . Breast cancer screening [Z12.39] 02/15/2016  . Affective psychosis, bipolar ([place]) [F31.9]   . Cannabis use disorder, moderate, dependence ([place]) [F12.20] 09/22/2015  . Cocaine use disorder, severe, dependence ([place]) [F14.20] 09/22/2015  . UTI (lower urinary tract infection) [N39.0] 09/04/2012   Total Time spent with patient: 15 minutes  Past Psychiatric History: no changes  Past Medical History:  Past Medical History:  Diagnosis Date  . Anxiety   . Bipolar 1 disorder ([place])   . Depression   . Drug abuse ([place])   . Headache   . Schizophrenia [facility])     Past Surgical History:  Procedure Laterality Date  . NO PAST SURGERIES     Family History:  Family History  Problem Relation Age of Onset  . Mental illness Maternal Aunt   . Cancer Maternal Aunt        breast   . Mental illness Paternal Aunt   . Anesthesia problems Neg Hx   . Hypotension Neg Hx   . Malignant hyperthermia Neg Hx   . Pseudochol deficiency Neg Hx     Social History:  Social History   Substance and Sexual Activity  Alcohol Use Yes  .  Alcohol/week: 3.0 standard drinks  . Types: 3 Cans of beer per week   Comment: 8-10 can daily     Social History   Substance and Sexual Activity  Drug Use Yes  . Types: Marijuana, Cocaine   Comment: occasionally    Social History   Socioeconomic History  . Marital status: Single    Spouse name: Not on file  . Number of children: Not on file  . Years of education: Not on file  . Highest education level: Not on file  Occupational History  . Not on file  Social Needs  . Financial resource strain: Not on file  . Food insecurity:    Worry: Not on file    Inability: Not on file  . Transportation needs:    Medical: Not on file    Non-medical: Not on file  Tobacco Use  . Smoking status: Current Every Day Smoker    Packs/day: 0.25    Years: 10.00    Pack years: 2.50    Types: Cigarettes, Cigars  . Smokeless tobacco: Never Used  Substance and Sexual Activity  . Alcohol use: Yes    Alcohol/week: 3.0 standard drinks    Types: 3 Cans of beer per week    Comment: 8-10 can daily  . Drug use: Yes    Types: Marijuana, Cocaine    Comment: occasionally  . Sexual activity: Yes    Birth control/protection: Condom  Lifestyle  . Physical activity:    Days per week: Not on file    Minutes per session: Not on file  . Stress: Not on file  Relationships  . Social connections:    Talks on phone: Not on file    Gets together: Not on file    Attends religious service: Not on file    Active member of club or organization: Not on file    Attends meetings of clubs or organizations: Not on file    Relationship status: Not on file  Other Topics Concern  . Not on file  Social History Narrative  . Not on file   Additional Social History:    Pain Medications: see MAR Prescriptions: see MAR Over the Counter: see MAR History of alcohol / drug use?: Yes Longest period of sobriety (when/how long): 7-8 months Name of Substance 1: Crack cocaine 1 - Age of First Use: UTA 1 - Amount  (size/oz): 3.5 grams 1 - Frequency: UTA 1 - Duration: "years" 1 - Last Use / Amount: 3.5 grams this morning Name of Substance 2: Alcohol 2 - Age of First Use: UTA 2 - Amount (size/oz): 8 beers 2 - Frequency: daily 2 - Duration: UTA 2 - Last Use / Amount: 11/04/18                Sleep: Good  Appetite:  Fair  Current Medications: Current Facility-Administered Medications  Medication Dose Route Frequency Provider Last Rate Last Dose  . feeding supplement (ENSURE ENLIVE) (ENSURE ENLIVE) liquid 237 mL  237 mL Oral BID BM Nwoko, Agnes I, NP   237 mL at 11/07/18 0840  . gabapentin (NEURONTIN) capsule 300 mg  300 mg Oral TID Johnn Hai, MD      . hydrOXYzine (ATARAX/VISTARIL) tablet 25 mg  25 mg Oral TID PRN Rozetta Nunnery, NP      . ibuprofen (ADVIL,MOTRIN) tablet 800 mg  800 mg Oral Q6H PRN Lindon Romp A, NP   800 mg at 11/06/18 2136  . multivitamin with minerals tablet 1 tablet  1 tablet Oral Daily Lindell Spar I, NP   1 tablet at 11/07/18 0839  . nicotine polacrilex (NICORETTE) gum 2 mg  2 mg Oral PRN Lindell Spar I, NP      . QUEtiapine (SEROQUEL) tablet 200 mg  200 mg Oral QHS Nwoko, Agnes I, NP   200 mg at 11/06/18 2136  . QUEtiapine (SEROQUEL) tablet 25 mg  25 mg Oral Q0600 Lindell Spar I, NP   25 mg at 11/07/18 3086    Lab Results:  Results for orders placed or performed during the hospital encounter of 11/05/18 (from the past 48 hour(s))  CBC with Differential     Status: Abnormal   Collection Time: 11/05/18 11:36 AM  Result Value Ref Range   WBC 5.7 4.0 - 10.5 K/uL   RBC 5.13 (H) 3.87 - 5.11 MIL/uL   Hemoglobin 15.3 (H) 12.0 - 15.0 g/dL   HCT 47.6 (H) 36.0 - 46.0 %   MCV 92.8 80.0 - 100.0 fL   MCH 29.8 26.0 - 34.0 pg   MCHC 32.1 30.0 - 36.0 g/dL   RDW 14.5 11.5 - 15.5 %   Platelets 271 150 - 400 K/uL   nRBC 0.0 0.0 - 0.2 %   Neutrophils Relative % 49 %   Neutro Abs 2.8 1.7 - 7.7 K/uL   Lymphocytes Relative 32 %   Lymphs Abs 1.8 0.7 - 4.0 K/uL   Monocytes  Relative 12 %   Monocytes Absolute 0.7 0.1 - 1.0 K/uL   Eosinophils Relative 5 %   Eosinophils Absolute 0.3 0.0 - 0.5 K/uL   Basophils Relative 1 %   Basophils Absolute 0.1 0.0 - 0.1 K/uL   Immature Granulocytes 1 %   Abs Immature Granulocytes 0.05 0.00 - 0.07 K/uL    Comment: Performed at [facility], [place] [address], [place], [place] [address]  I-Stat beta hCG blood, ED     Status: None   Collection Time: 11/05/18 11:40 AM  Result Value Ref Range   I-stat hCG, quantitative <5.0 <5 mIU/mL   Comment 3            Comment:   GEST. AGE      CONC.  (mIU/mL)   <=1 WEEK        5 - 50     2 WEEKS       50 - 500     3 WEEKS       100 - 10,000     4 WEEKS     1,000 - 30,000        FEMALE AND NON-PREGNANT FEMALE:     LESS THAN 5 mIU/mL   Acetaminophen level     Status: Abnormal   Collection Time: 11/05/18 12:14 PM  Result Value Ref Range   Acetaminophen (Tylenol), Serum <10 (L) 10 - 30 ug/mL    Comment: (NOTE) Therapeutic concentrations vary significantly. A range of 10-30 ug/mL  may be an effective concentration for many patients. However, some  are best treated at concentrations outside of this range. Acetaminophen concentrations >150 ug/mL at 4 hours after ingestion  and >50 ug/mL at 12 hours after ingestion are often associated with  toxic reactions. Performed at [facility], [place] [address] [place], [place] [address]   Ethanol     Status: None   Collection Time: 11/05/18 12:14 PM  Result Value Ref Range   Alcohol, Ethyl (B) <10 <10 mg/dL    Comment: (NOTE) Lowest detectable limit for serum alcohol is 10 mg/dL. For medical purposes only. Performed at [facility], [place] [address] [place], [place] [address]   Comprehensive metabolic panel     Status: Abnormal   Collection Time: 11/05/18 12:14 PM  Result Value Ref Range   Sodium 138 135 - 145 mmol/L   Potassium 4.3 3.5 - 5.1 mmol/L   Chloride 101 98 - 111 mmol/L    CO2 27 22 - 32 mmol/L   Glucose, Bld 94 70 - 99 mg/dL   BUN 16 6 - 20 mg/dL   Creatinine, Ser 1.07 (H) 0.44 - 1.00 mg/dL   Calcium 8.7 (L) 8.9 - 10.3 mg/dL   Total Protein 6.9 6.5 - 8.1 g/dL   Albumin 4.0 3.5 - 5.0 g/dL   AST 39 15 - 41 U/L   ALT 20 0 - 44 U/L   Alkaline Phosphatase 66 38 - 126 U/L   Total Bilirubin 1.1 0.3 - 1.2 mg/dL   GFR calc non Af Amer 57 (L) >60 mL/min   GFR calc Af Amer >60 >60 mL/min    Comment: (NOTE) The eGFR has been calculated using the CKD EPI equation. This calculation has not been validated in all clinical situations. eGFR's persistently <60 mL/min signify possible Chronic Kidney Disease.    Anion gap 10 5 - 15    Comment: Performed at [facility], [place] [address], [place],  [address]  Salicylate level     Status: None   Collection Time: 11/05/18 12:14 PM  Result Value Ref Range   Salicylate Lvl <4.8 2.8 - 30.0 mg/dL    Comment: Performed at [facility], [place] [address] [place], [place] [address]  Urine rapid drug screen (hosp performed)     Status: Abnormal   Collection Time: 11/05/18  4:16 PM  Result Value Ref Range   Opiates NONE DETECTED NONE DETECTED   Cocaine POSITIVE (A) NONE DETECTED   Benzodiazepines NONE DETECTED NONE DETECTED   Amphetamines NONE DETECTED NONE DETECTED   Tetrahydrocannabinol NONE DETECTED NONE DETECTED   Barbiturates NONE DETECTED NONE DETECTED    Comment: (NOTE) DRUG SCREEN FOR MEDICAL PURPOSES ONLY.  IF CONFIRMATION IS NEEDED FOR ANY PURPOSE, NOTIFY LAB WITHIN 5 DAYS. LOWEST DETECTABLE LIMITS FOR URINE DRUG SCREEN Drug Class                     Cutoff (ng/mL) Amphetamine and metabolites    1000 Barbiturate and metabolites    200 Benzodiazepine                 704 Tricyclics and metabolites     300 Opiates and metabolites        300 Cocaine and metabolites        300 THC                            50 Performed at [facility], [place] [address], [place], [place] [address]   Urinalysis, Routine w reflex microscopic     Status: Abnormal   Collection Time: 11/05/18  4:16 PM  Result Value Ref Range   Color, Urine YELLOW YELLOW   APPearance CLEAR CLEAR   Specific Gravity, Urine 1.017 1.005 - 1.030   pH 5.0 5.0 - 8.0   Glucose, UA NEGATIVE NEGATIVE mg/dL   Hgb urine dipstick SMALL (A) NEGATIVE   Bilirubin Urine NEGATIVE NEGATIVE   Ketones, ur 5 (A) NEGATIVE mg/dL   Protein, ur NEGATIVE NEGATIVE mg/dL   Nitrite NEGATIVE NEGATIVE   Leukocytes, UA NEGATIVE NEGATIVE   RBC / HPF 0-5 0 - 5 RBC/hpf   WBC, UA 0-5 0 - 5 WBC/hpf   Bacteria, UA NONE SEEN NONE SEEN   Mucus PRESENT     Comment: Performed at [facility], [place] [address], [place], [place] [address]    Blood Alcohol level:  Lab Results  Component Value Date   ETH <10 11/05/2018   ETH <10 38/88/2800    Metabolic Disorder Labs: Lab Results  Component Value Date   HGBA1C 5.7 (H) 04/07/2018   MPG 116.89 04/07/2018   MPG 123 (H) 02/15/2016   Lab Results  Component Value Date   PROLACTIN 8.9 04/07/2018   PROLACTIN 14.3 09/23/2015   Lab Results  Component Value Date   CHOL 219 (H) 04/07/2018   TRIG 177 (H) 04/07/2018   HDL 98 04/07/2018   CHOLHDL 2.2 04/07/2018   VLDL 35 04/07/2018   LDLCALC 86 04/07/2018   LDLCALC 56 02/15/2016    Physical Findings: AIMS: Facial and Oral Movements Muscles of Facial Expression: None, normal Lips and Perioral Area: None, normal Jaw: None, normal Tongue: None, normal,Extremity Movements Upper (arms, wrists, hands, fingers): None, normal Lower (legs, knees, ankles, toes): None, normal, Trunk Movements Neck, shoulders, hips: None, normal, Overall Severity Severity of abnormal movements (highest score from questions above): None,  normal Incapacitation due to abnormal movements: None, normal Patient's awareness of abnormal movements (rate only patient's report): No Awareness, Dental Status Current problems  with teeth and/or dentures?: No Does patient usually wear dentures?: No  CIWA:    COWS:     Musculoskeletal: Strength & Muscle Tone: within normal limits Gait & Station: normal Patient leans: N/A  Psychiatric Specialty Exam: Physical Exam  ROS  Blood pressure 121/63, pulse 93, temperature 97.6 F (36.4 C), temperature source Oral, resp. rate 16, height 5' (1.524 m), weight 49 kg, SpO2 100 %.Body mass index is 21.09 kg/m.  General Appearance: Casual  Eye Contact:  Fair  Speech:  Clear and Coherent  Volume:  Decreased  Mood:  Dysphoric  Affect:  Appropriate  Thought Process:  Coherent  Orientation: x3  Thought Content:  Logical  Suicidal Thoughts:  No  Homicidal Thoughts:  No  Memory:  Immediate;   Good  Judgement:  Good  Insight:  Good  Psychomotor Activity:  NA  Concentration:  Concentration: Good  Recall:  Good  Fund of Knowledge:  Good  Language:  Good  Akathisia:  Negative  Handed:  Right  AIMS (if indicated):     Assets:  Communication Skills  ADL's:  Intact  Cognition:  WNL  Sleep:  Number of Hours: 6.75     Treatment Plan Summary: Daily contact with patient to assess and evaluate symptoms and progress in treatment, Medication management and Plan Will escalate gabapentin over the changes may discharge 1 to 2 days  Johnn Hai, MD 11/07/2018, 10:18 AM

## 2018-11-07 NOTE — Progress Notes (Signed)
D: Patient is in bed on approach. Patient is cooperative. Patient endorses passive SI. Patient reports AVH described as "people in my food" "they say nasty things". Patient denies HI and verbally contracts for safety. Patient denies physical symptoms/pain. Patient got out of bed for medications and went back to bed.   A: Scheduled medications administered per MD order. Support provided. Patient educated on safety on the unit and medications. Routine safety checks every 15 minutes. Patient stated understanding to tell nurse about any new physical symptoms. Patient understands to tell staff of any needs.     R: No adverse drug reactions noted. Patient verbally contracts for safety. Patient remains safe at this time and will continue to monitor.

## 2018-11-07 NOTE — Progress Notes (Signed)
Recreation Therapy Notes  Date: 11.14.19 Time: 1000 Location: 500 Hall Dayroom  Group Topic: Anger Management  Goal Area(s) Addresses:  Patient will identify situations that lead to anger. Patient will identify ways in which they act because of anger. Patient will identify problems they have encountered because of anger.  Intervention:  Worksheet  Activity:  Introduction to Anger.  Patients were to identify three situations that get them angry, ways in which they react to anger and any problems they encountered because of anger.  Education: Anger Management, Discharge Planning   Education Outcome: Acknowledges education/In group clarification offered/Needs additional education.   Clinical Observations/Feedback: Patient did not attend group.     Caroll RancherMarjette Valente Fosberg, LRT/CTRS         Caroll RancherLindsay, Jawara Latorre A 11/07/2018 11:31 AM

## 2018-11-07 NOTE — Plan of Care (Signed)
  Problem: Education: Goal: Knowledge of [facility] Education information/materials will improve Outcome: Progressing Goal: Mental status will improve Outcome: Progressing   Problem: Safety: Goal: Periods of time without injury will increase Outcome: Progressing   Patient oriented to the unit. Patient reports AVH are improved since admission. Patient remains safe and will continue to monitor.

## 2018-11-07 NOTE — BHH Counselor (Signed)
[name] requested access to her cell phone to retrieve the number of [place]. Security accompanied us to her locker. Cell phone was not charged. [name] requested that CSW staff charge the phone for her.  Marian Sorrowherie Bohaboy, MSW Intern 11/07/18 12:09 PM

## 2018-11-07 NOTE — Progress Notes (Signed)
Patient ID: [name], female   DOB: 03-21-1961, 57 y.o.   MRN: [id number]   D: Pt reclusive to her room earlier in shift except for when she came to the nurses' station for her evening meds.  Pt calm and cooperative, denied SI/HI/AVH.  A: Pt given all meds as scheduled, and is being maintained on Q15 minute checks for safety.  Pt complained of +AH earlier in shift, stated that the voices were asking her why she is still here, and telling her that she is no good.  Pt was given her scheduled dose of Seroquel for +AH, and is currently resting in bed, seems to be sleeping, with no signs of distress.  R: Will continue to maintain on Q15 minute checks and monitor.

## 2018-11-07 NOTE — BHH Group Notes (Signed)
The focus of this group is to help patients establish daily goals to achieve during treatment and discuss how the patient can incorporate goal setting into their daily lives to aide in recovery.  Pt did not attend 

## 2018-11-07 NOTE — BHH Group Notes (Addendum)
LCSW Group Therapy Note  11/06/2018 1:15 PM  Type of Therapy/Topic:  Group Therapy:  Emotion Regulation  Participation Level:  Active   Description of Group:   The purpose of this group is to assist patients in learning to regulate negative emotions and experience positive emotions. Patients will be guided to discuss ways in which they have been vulnerable to their negative emotions. These vulnerabilities will be juxtaposed with experiences of positive emotions or situations, and patients will be challenged to use positive emotions to combat negative ones. Special emphasis will be placed on coping with negative emotions in conflict situations, and patients will process healthy conflict resolution skills.  Therapeutic Goals: 1. Patient will identify two positive emotions or experiences to reflect on in order to balance out   negative emotions 2. Patient will label two or more emotions that they find the most difficult to experience 3. Patient will demonstrate positive conflict resolution skills through discussion and/or role plays  Summary of Patient Progress:  Stephanie BradfordKimberly attended the entire session. She was engaged, oriented and appropriate. She stated that sometimes anger is a difficult emotion for her. She Identified walking away as a coping strategy for difficult emotions.    Therapeutic Modalities:   Cognitive Behavioral Therapy Feelings Identification Dialectical Behavioral Therapy   Marian SorrowCherie Bohaboy MSW Intern 11/07/2018 8:22 AM

## 2018-11-07 NOTE — Plan of Care (Signed)
Progress note  D: pt found in bed; compliant with medication administration. Pt states she slept fair. Pt rates her depression/hopelessness/anxiety a 5/5/5 out of 10 respectively. Pt complains of agitation, lightheadedness, headaches, blurred vision and pain in her legs, rating her pain an 8/10. Pt states her goal for  today is to start her meds but failed to state how she would achieve this. Pt denies any si/hi/ and verbally agrees to approach staff if these become apparent or [facility]. Pt states she is still hearing and seeing things but they are getting better. A: pt provided support and encouragement. Pt given medication per protocol and standing orders. Q8216m safety checks implemented and continued. R: pt safe on the unit. Will continue to monitor.   Pt progressing in the following metrics  Problem: Activity: Goal: Sleeping patterns will improve Outcome: Progressing   Problem: Coping: Goal: Ability to verbalize frustrations and anger appropriately will improve Outcome: Progressing Goal: Ability to demonstrate self-control will improve Outcome: Progressing   Problem: Health Behavior/Discharge Planning: Goal: Identification of resources available to assist in meeting health care needs will improve Outcome: Progressing

## 2018-11-07 NOTE — BHH Group Notes (Signed)
LCSW Group Therapy Note  11/07/2018 3:12 PM  Type of Therapy/Topic:  Group Therapy:  Balance in Life  Participation Level:  Active  Description of Group:     This group will address the concept of balance and how it feels and looks when one is unbalanced. Patients will be encouraged to process areas in their lives that are out of balance and identify reasons for remaining unbalanced. Facilitators will guide patients in utilizing problem-solving interventions to address and correct the stressor making their life unbalanced. Understanding and applying boundaries will be explored and addressed for obtaining and maintaining a balanced life. Patients will be encouraged to explore ways to assertively make their unbalanced needs known to significant others in their lives, using other group members and facilitator for support and feedback.  Therapeutic Goals:  1.     Patient will identify two or more emotions or situations they have that consume much of in their lives. 2.     Patient will identify signs/triggers that life has become out of balance: 3.     Patient will identify two ways to set boundaries in order to achieve balance in their lives: 4.     Patient will demonstrate ability to communicate their needs through discussion and/or role plays  Summary of Patient Progress:  Stephanie BradfordKimberly attended the entire session. She was appropriate and engaged with the topic. She stated "I am open to learn ways to stay balanced".  Therapeutic Modalities:   Cognitive Behavioral Therapy Solution-Focused Therapy Assertiveness Training  Marian Sorrowherie Bohaboy MSW Intern Phone: [phone] Fax: [phone]

## 2018-11-08 NOTE — Progress Notes (Signed)
Recreation Therapy Notes  Date: 11.15.19 Time: 1000 Location:  500 Hall Dayroom  Group Topic: Communication, Team Building, Problem Solving  Goal Area(s) Addresses:  Patient will effectively work with peer towards shared goal.  Patient will identify skills used to make activity successful.  Patient will identify how skills used during activity can be used to reach post d/c goals.   Behavioral Response: Engaged  Intervention: STEM Activity  Activity: Stage managerLanding Pad. In teams patients were given 12 plastic drinking straws and a length of masking tape. Using the materials provided patients were asked to build a landing pad to catch a golf ball dropped from approximately 6 feet in the air.   Education: Pharmacist, communityocial Skills, Discharge Planning   Education Outcome: Acknowledges education/In group clarification offered/Needs additional education.   Clinical Observations/Feedback: Pt was smiling and bright throughout activity.  Pt took on the leadership role when she needed to.  Pt expressed the group had to use building skills and imagination to complete the activity.      [name], LRT/CTRS         Stephanie RancherLindsay, Stephanie [place]rose A 11/08/2018 11:37 AM

## 2018-11-08 NOTE — Progress Notes (Signed)
D: Pt denies SI/HI/AVH. Pt is pleasant and cooperative. Pt stated she was feeling a lot better. Pt expecting to D/C on Monday.   A: Pt was offered support and encouragement. Pt was given scheduled medications. Pt was encourage to attend groups. Q 15 minute checks were done for safety.   R: Pt is taking medication. Pt has no complaints.Pt receptive to treatment and safety maintained on unit.  Problem: Education: Goal: Emotional status will improve Outcome: Progressing   Problem: Education: Goal: Mental status will improve Outcome: Progressing

## 2018-11-08 NOTE — BHH Group Notes (Signed)
Date/Time: 11/08/18, 1315  Group Therapy: Chaplain-Led Processing Group   Participation Level: Active  Participation Quality: Attentive  Affect: Flat  Cognitive: Oriented  Insight: Limited  Engagement in Therapy: Limited  Modes of Intervention: Discussion, Socialization  Summary of Progress/Problems: Chaplain was here to lead a group on themes of hope and courage. Pt present at the beginning of group but left shortly afterwards.  Before she left she spoke about her father being someone who believed in her and who gave her home when she was younger.  Daleen SquibbGreg Saksham Akkerman, LCSW

## 2018-11-08 NOTE — Progress Notes (Signed)
[facility] MD Progress Note  11/08/2018 10:12 AM [name]  MRN:  [id number] Subjective:   In bed this morning stating the meds are helpful no cravings at the present time no thoughts of harming self other seeking rehab options Principal Problem: Cocaine use disorder, severe, dependence (HCC) Diagnosis: No change Patient Active Problem List   Diagnosis Date Noted  . Schizoaffective disorder (HCC) [F25.9] 11/05/2018  . Bacterial vaginosis [N76.0, B96.89] 03/01/2017  . Schizoaffective disorder, bipolar type (HCC) [F25.0] 05/18/2016  . Annual physical exam [Z00.00] 02/15/2016  . Encounter for routine gynecological examination [Z01.419] 02/15/2016  . Breast cancer screening [Z12.39] 02/15/2016  . Affective psychosis, bipolar (HCC) [F31.9]   . Cannabis use disorder, moderate, dependence (HCC) [F12.20] 09/22/2015  . Cocaine use disorder, severe, dependence (HCC) [F14.20] 09/22/2015  . UTI (lower urinary tract infection) [N39.0] 09/04/2012   Total Time spent with patient: 20  Past Medical History:  Past Medical History:  Diagnosis Date  . Anxiety   . Bipolar 1 disorder (HCC)   . Depression   . Drug abuse (HCC)   . Headache   . Schizophrenia [facility])     Past Surgical History:  Procedure Laterality Date  . NO PAST SURGERIES     Family History:  Family History  Problem Relation Age of Onset  . Mental illness Maternal Aunt   . Cancer Maternal Aunt        breast   . Mental illness Paternal Aunt   . Anesthesia problems Neg Hx   . Hypotension Neg Hx   . Malignant hyperthermia Neg Hx   . Pseudochol deficiency Neg Hx    Family Psychiatric  History: no changes Social History:  Social History   Substance and Sexual Activity  Alcohol Use Yes  . Alcohol/week: 3.0 standard drinks  . Types: 3 Cans of beer per week   Comment: 8-10 can daily     Social History   Substance and Sexual Activity  Drug Use Yes  . Types: Marijuana, Cocaine   Comment: occasionally    Social History    Socioeconomic History  . Marital status: Single    Spouse name: Not on file  . Number of children: Not on file  . Years of education: Not on file  . Highest education level: Not on file  Occupational History  . Not on file  Social Needs  . Financial resource strain: Not on file  . Food insecurity:    Worry: Not on file    Inability: Not on file  . Transportation needs:    Medical: Not on file    Non-medical: Not on file  Tobacco Use  . Smoking status: Current Every Day Smoker    Packs/day: 0.25    Years: 10.00    Pack years: 2.50    Types: Cigarettes, Cigars  . Smokeless tobacco: Never Used  Substance and Sexual Activity  . Alcohol use: Yes    Alcohol/week: 3.0 standard drinks    Types: 3 Cans of beer per week    Comment: 8-10 can daily  . Drug use: Yes    Types: Marijuana, Cocaine    Comment: occasionally  . Sexual activity: Yes    Birth control/protection: Condom  Lifestyle  . Physical activity:    Days per week: Not on file    Minutes per session: Not on file  . Stress: Not on file  Relationships  . Social connections:    Talks on phone: Not on file    Gets together: Not  on file    Attends religious service: Not on file    Active member of club or organization: Not on file    Attends meetings of clubs or organizations: Not on file    Relationship status: Not on file  Other Topics Concern  . Not on file  Social History Narrative  . Not on file   Additional Social History:    Pain Medications: see MAR Prescriptions: see MAR Over the Counter: see MAR History of alcohol / drug use?: Yes Longest period of sobriety (when/how long): 7-8 months Name of Substance 1: Crack cocaine 1 - Age of First Use: UTA 1 - Amount (size/oz): 3.5 grams 1 - Frequency: UTA 1 - Duration: "years" 1 - Last Use / Amount: 3.5 grams this morning Name of Substance 2: Alcohol 2 - Age of First Use: UTA 2 - Amount (size/oz): 8 beers 2 - Frequency: daily 2 - Duration: UTA 2 -  Last Use / Amount: 11/04/18                Sleep: Good  Appetite:  Good  Current Medications: Current Facility-Administered Medications  Medication Dose Route Frequency Provider Last Rate Last Dose  . feeding supplement (ENSURE ENLIVE) (ENSURE ENLIVE) liquid 237 mL  237 mL Oral BID BM Nwoko, Agnes I, NP   237 mL at 11/07/18 2100  . gabapentin (NEURONTIN) capsule 300 mg  300 mg Oral TID Malvin Johns, MD   300 mg at 11/08/18 1610  . hydrOXYzine (ATARAX/VISTARIL) tablet 25 mg  25 mg Oral TID PRN Jackelyn Poling, NP      . ibuprofen (ADVIL,MOTRIN) tablet 800 mg  800 mg Oral Q6H PRN Nira Conn A, NP   800 mg at 11/08/18 0641  . multivitamin with minerals tablet 1 tablet  1 tablet Oral Daily Armandina Stammer I, NP   1 tablet at 11/08/18 0824  . nicotine polacrilex (NICORETTE) gum 2 mg  2 mg Oral PRN Armandina Stammer I, NP      . QUEtiapine (SEROQUEL) tablet 200 mg  200 mg Oral QHS Nwoko, Agnes I, NP   200 mg at 11/07/18 2156  . QUEtiapine (SEROQUEL) tablet 25 mg  25 mg Oral Q0600 Armandina Stammer I, NP   25 mg at 11/08/18 9604    Lab Results: No results found for this or any previous visit (from the past 48 hour(s)).  Blood Alcohol level:  Lab Results  Component Value Date   ETH <10 11/05/2018   ETH <10 04/02/2018    Metabolic Disorder Labs: Lab Results  Component Value Date   HGBA1C 5.7 (H) 04/07/2018   MPG 116.89 04/07/2018   MPG 123 (H) 02/15/2016   Lab Results  Component Value Date   PROLACTIN 8.9 04/07/2018   PROLACTIN 14.3 09/23/2015   Lab Results  Component Value Date   CHOL 219 (H) 04/07/2018   TRIG 177 (H) 04/07/2018   HDL 98 04/07/2018   CHOLHDL 2.2 04/07/2018   VLDL 35 04/07/2018   LDLCALC 86 04/07/2018   LDLCALC 56 02/15/2016    Physical Findings: AIMS: Facial and Oral Movements Muscles of Facial Expression: None, normal Lips and Perioral Area: None, normal Jaw: None, normal Tongue: None, normal,Extremity Movements Upper (arms, wrists, hands, fingers): None,  normal Lower (legs, knees, ankles, toes): None, normal, Trunk Movements Neck, shoulders, hips: None, normal, Overall Severity Severity of abnormal movements (highest score from questions above): None, normal Incapacitation due to abnormal movements: None, normal Patient's awareness of abnormal movements (rate only  patient's report): No Awareness, Dental Status Current problems with teeth and/or dentures?: No Does patient usually wear dentures?: No  CIWA:    COWS:     Musculoskeletal: Strength & Muscle Tone: within normal limits Gait & Station: normal Patient leans: N/A  Psychiatric Specialty Exam: Physical Exam  ROS  Blood pressure (!) 146/97, pulse 84, temperature 97.8 F (36.6 C), temperature source Oral, resp. rate 18, height 5' (1.524 m), weight 49 kg, SpO2 100 %.Body mass index is 21.09 kg/m.  General Appearance: Disheveled  Eye Contact:  Good  Speech:  Clear and Coherent  Volume:  Normal  Mood:  Anxious  Affect:  Congruent  Thought Process:  Coherent  Orientation:  NA  Thought Content:  Logical  Suicidal Thoughts:  No  Homicidal Thoughts:  No  Memory:  Immediate;   Fair  Judgement:  Fair  Insight:  Fair  Psychomotor Activity:  Normal  Concentration:  Concentration: Good  Recall:  Good  Fund of Knowledge:  NA  Language:  Good  Akathisia:  Negative  Handed:  Right  AIMS (if indicated):     Assets:  Communication Skills  ADL's:  Intact  Cognition:  WNL  Sleep:  Number of Hours: 6.75     Treatment Plan Summary: Continue current measures continue to seek rehab options  Vianne Grieshop, MD 11/08/2018, 10:12 AM

## 2018-11-08 NOTE — Progress Notes (Signed)
Pt presents with an anxious mood. Pt rated on her self inventory sheet: depression 6/10, anxiety 6, and hopelessness 6/10, and withdrawal symptoms of agitation.  Pt denies SI/HI. Pt reported fair sleep last night. Pt expressed having racing thoughts this morning. Pt denies AVH.   Medications reviewed with pt. Medications administered as ordered per MD. Verbal support provided. Pt encouraged to attend groups. 15 minute checks performed for safety.   Pt compliant with tx plan. No side effects to medications verbalized by pt.

## 2018-11-09 NOTE — Progress Notes (Signed)
Adult Psychoeducational Group Note  Date:  11/09/2018 Time:  11:22 PM  Group Topic/Focus:  Wrap-Up Group:   The focus of this group is to help patients review their daily goal of treatment and discuss progress on daily workbooks.  Participation Level:  Did Not Attend  Participation Quality:  Did not attend  Affect:  Did not attend   Cognitive:  Did not attend  Insight: None  Engagement in Group:  Did not attend  Modes of Intervention:  Did not attend  Additional Comments:  Pt did not attend evening wrap up group tonight.  Felipa FurnaceChristopher  Trellis Guirguis 11/09/2018, 11:22 PM

## 2018-11-09 NOTE — [facility] Group Notes (Signed)
  [facility] LCSW Group Therapy Note  Date/Time:  11/09/2018 11:15AM-12:00PM  Type of Therapy and Topic:  Group Therapy:  Feelings About Hospitalization  Participation Level:  Active   Description of Group This process group involved patients discussing their feelings related to being hospitalized, as well as the benefits they see to being in the hospital.  These feelings and benefits were itemized.  The group then brainstormed specific ways in which they could seek those same benefits when they discharge and return home.  Therapeutic Goals 1. Patient will identify and describe positive and negative feelings related to hospitalization 2. Patient will verbalize benefits of hospitalization to themselves personally 3. Patients will brainstorm together ways they can obtain similar benefits in the outpatient setting, identify barriers to wellness and possible solutions  Summary of Patient Progress:  The patient expressed her primary feelings about being hospitalized are fine because she is getting help.  She said that to stay well she has to stay on her medication, guard her people/places/things, and go to her appointments.  Therapeutic Modalities Cognitive Behavioral Therapy Motivational Interviewing    Ambrose MantleMareida Grossman-Orr, LCSW 11/09/2018, 1:19 PM

## 2018-11-09 NOTE — Progress Notes (Signed)
[facility] MD Progress Note  11/09/2018 7:05 AM [name]  MRN:  [id number] Subjective:   Reports indicate patient remains generally stable pleasant and without acute suicidal thinking still seeking some type of rehab no cravings for cocaine reported mood is more stable Principal Problem: Cocaine use disorder, severe, dependence (HCC) Diagnosis: Substance-induced mood disorder most likely Patient Active Problem List   Diagnosis Date Noted  . Schizoaffective disorder (HCC) [F25.9] 11/05/2018  . Bacterial vaginosis [N76.0, B96.89] 03/01/2017  . Schizoaffective disorder, bipolar type (HCC) [F25.0] 05/18/2016  . Annual physical exam [Z00.00] 02/15/2016  . Encounter for routine gynecological examination [Z01.419] 02/15/2016  . Breast cancer screening [Z12.39] 02/15/2016  . Affective psychosis, bipolar (HCC) [F31.9]   . Cannabis use disorder, moderate, dependence (HCC) [F12.20] 09/22/2015  . Cocaine use disorder, severe, dependence (HCC) [F14.20] 09/22/2015  . UTI (lower urinary tract infection) [N39.0] 09/04/2012   Total Time spent with patient: 20 minutes  Past Psychiatric History: no new data  Past Medical History:  Past Medical History:  Diagnosis Date  . Anxiety   . Bipolar 1 disorder (HCC)   . Depression   . Drug abuse (HCC)   . Headache   . Schizophrenia [facility])     Past Surgical History:  Procedure Laterality Date  . NO PAST SURGERIES     Family History:  Family History  Problem Relation Age of Onset  . Mental illness Maternal Aunt   . Cancer Maternal Aunt        breast   . Mental illness Paternal Aunt   . Anesthesia problems Neg Hx   . Hypotension Neg Hx   . Malignant hyperthermia Neg Hx   . Pseudochol deficiency Neg Hx    Family Psychiatric  History: no new data Social History:  Social History   Substance and Sexual Activity  Alcohol Use Yes  . Alcohol/week: 3.0 standard drinks  . Types: 3 Cans of beer per week   Comment: 8-10 can daily     Social  History   Substance and Sexual Activity  Drug Use Yes  . Types: Marijuana, Cocaine   Comment: occasionally    Social History   Socioeconomic History  . Marital status: Single    Spouse name: Not on file  . Number of children: Not on file  . Years of education: Not on file  . Highest education level: Not on file  Occupational History  . Not on file  Social Needs  . Financial resource strain: Not on file  . Food insecurity:    Worry: Not on file    Inability: Not on file  . Transportation needs:    Medical: Not on file    Non-medical: Not on file  Tobacco Use  . Smoking status: Current Every Day Smoker    Packs/day: 0.25    Years: 10.00    Pack years: 2.50    Types: Cigarettes, Cigars  . Smokeless tobacco: Never Used  Substance and Sexual Activity  . Alcohol use: Yes    Alcohol/week: 3.0 standard drinks    Types: 3 Cans of beer per week    Comment: 8-10 can daily  . Drug use: Yes    Types: Marijuana, Cocaine    Comment: occasionally  . Sexual activity: Yes    Birth control/protection: Condom  Lifestyle  . Physical activity:    Days per week: Not on file    Minutes per session: Not on file  . Stress: Not on file  Relationships  . Social connections:  Talks on phone: Not on file    Gets together: Not on file    Attends religious service: Not on file    Active member of club or organization: Not on file    Attends meetings of clubs or organizations: Not on file    Relationship status: Not on file  Other Topics Concern  . Not on file  Social History Narrative  . Not on file   Additional Social History:    Pain Medications: see MAR Prescriptions: see MAR Over the Counter: see MAR History of alcohol / drug use?: Yes Longest period of sobriety (when/how long): 7-8 months Name of Substance 1: Crack cocaine 1 - Age of First Use: UTA 1 - Amount (size/oz): 3.5 grams 1 - Frequency: UTA 1 - Duration: "years" 1 - Last Use / Amount: 3.5 grams this  morning Name of Substance 2: Alcohol 2 - Age of First Use: UTA 2 - Amount (size/oz): 8 beers 2 - Frequency: daily 2 - Duration: UTA 2 - Last Use / Amount: 11/04/18                Sleep: Good  Appetite:  Good  Current Medications: Current Facility-Administered Medications  Medication Dose Route Frequency Provider Last Rate Last Dose  . feeding supplement (ENSURE ENLIVE) (ENSURE ENLIVE) liquid 237 mL  237 mL Oral BID BM Nwoko, Agnes I, NP   237 mL at 11/08/18 1600  . gabapentin (NEURONTIN) capsule 300 mg  300 mg Oral TID Malvin Johns, MD   300 mg at 11/08/18 1708  . hydrOXYzine (ATARAX/VISTARIL) tablet 25 mg  25 mg Oral TID PRN Nira Conn A, NP      . ibuprofen (ADVIL,MOTRIN) tablet 800 mg  800 mg Oral Q6H PRN Nira Conn A, NP   800 mg at 11/08/18 0641  . multivitamin with minerals tablet 1 tablet  1 tablet Oral Daily Armandina Stammer I, NP   1 tablet at 11/08/18 0824  . nicotine polacrilex (NICORETTE) gum 2 mg  2 mg Oral PRN Armandina Stammer I, NP      . QUEtiapine (SEROQUEL) tablet 200 mg  200 mg Oral QHS Nwoko, Agnes I, NP   200 mg at 11/08/18 2235  . QUEtiapine (SEROQUEL) tablet 25 mg  25 mg Oral Q0600 Armandina Stammer I, NP   25 mg at 11/09/18 6962    Lab Results: No results found for this or any previous visit (from the past 48 hour(s)).  Blood Alcohol level:  Lab Results  Component Value Date   ETH <10 11/05/2018   ETH <10 04/02/2018    Metabolic Disorder Labs: Lab Results  Component Value Date   HGBA1C 5.7 (H) 04/07/2018   MPG 116.89 04/07/2018   MPG 123 (H) 02/15/2016   Lab Results  Component Value Date   PROLACTIN 8.9 04/07/2018   PROLACTIN 14.3 09/23/2015   Lab Results  Component Value Date   CHOL 219 (H) 04/07/2018   TRIG 177 (H) 04/07/2018   HDL 98 04/07/2018   CHOLHDL 2.2 04/07/2018   VLDL 35 04/07/2018   LDLCALC 86 04/07/2018   LDLCALC 56 02/15/2016    Physical Findings: AIMS: Facial and Oral Movements Muscles of Facial Expression: None,  normal Lips and Perioral Area: None, normal Jaw: None, normal Tongue: None, normal,Extremity Movements Upper (arms, wrists, hands, fingers): None, normal Lower (legs, knees, ankles, toes): None, normal, Trunk Movements Neck, shoulders, hips: None, normal, Overall Severity Severity of abnormal movements (highest score from questions above): None, normal Incapacitation due  to abnormal movements: None, normal Patient's awareness of abnormal movements (rate only patient's report): No Awareness, Dental Status Current problems with teeth and/or dentures?: No Does patient usually wear dentures?: No  CIWA:    COWS:     Musculoskeletal: Strength & Muscle Tone: within normal limits Gait & Station: normal Patient leans: N/A  Psychiatric Specialty Exam: Physical Exam  ROS  Blood pressure 118/79, pulse 90, temperature 98.2 F (36.8 C), temperature source Oral, resp. rate 18, height 5' (1.524 m), weight 49 kg, SpO2 100 %.Body mass index is 21.09 kg/m.  General Appearance: Casual  Eye Contact:  Good  Speech:  Clear and Coherent  Volume:  Normal  Mood:  Dysphoric  Affect:  Appropriate  Thought Process:  Coherent  Orientation:  Negative  Thought Content:  Logical  Suicidal Thoughts:  No  Homicidal Thoughts:  No  Memory:  Recent;   Good  Judgement:  Good  Insight:  Good  Psychomotor Activity:  Normal  Concentration:  Concentration: Good  Recall:  Good  Fund of Knowledge:  Good  Language:  Good  Akathisia:  Negative  Handed:  Right  AIMS (if indicated):     Assets:  Physical Health  ADL's:  Intact  Cognition:  WNL  Sleep:  Number of Hours: 6.75     Treatment Plan Summary: Daily contact with patient to assess and evaluate symptoms and progress in treatment, Medication management and Plan [place] discharge Monday no change in plans or meds  Shifa Brisbon, MD 11/09/2018, 7:05 AM

## 2018-11-09 NOTE — Progress Notes (Signed)
Writer spoke with patient 1:1 and she reports that she has had a good day. She is trying to decide if she wants to move to [place] with a friend instead of going back to her ex-boyfriends place. She is anxious and stated that she has got to figure this out by Monday. Writer encouraged her to weigh out her options and which environment she would do better in. Support given and safety maintained on unit with 15 min checks.

## 2018-11-09 NOTE — Plan of Care (Signed)
  Problem: Safety: Goal: Periods of time without injury will increase Outcome: Progressing   Problem: Activity: Goal: Interest or engagement in activities will improve Outcome: Progressing  DAR NOTE: Patient presents with anxious affect and depressed mood.  Denies suicidal thoughts, auditory and visual hallucinations.  Rates depression at 5, hopelessness at 5, and anxiety at 5.  Maintained on routine safety checks.  Medications given as prescribed.  Support and encouragement offered as needed.  Attended group and participated.  States goal for today is "stay positive."  Patient observed socializing with peers in the dayroom.  Offered no complaint.

## 2018-11-10 MED ORDER — CLONIDINE HCL 0.1 MG PO TABS
0.1000 mg | ORAL_TABLET | Freq: Two times a day (BID) | ORAL | Status: DC
  Administered 2018-11-10 (×2): 0.1 mg via ORAL
  Filled 2018-11-10 (×8): qty 1

## 2018-11-10 NOTE — Progress Notes (Addendum)
Patient ID: [name], female   DOB: 10/31/1961, 57 y.o.   MRN: [id number]  D. Pt presents with a labile affect and anxious behavior. Pt remains in her room for most of the day today. Pt forwards little to writer but smiles this afternoon. Reports ongoing chronic pain as a amjor concern currently.   A. Labs and vitals monitored. Pt given and educated on medications. Pt supported emotionally and encouraged to express concerns and ask questions.    R. Pt remains safe with 15 minute checks. Pt currently denies SI/HI and AVH and agrees to contact staff before acting on any harmful thoughts. Will continue POC.

## 2018-11-10 NOTE — [facility] Group Notes (Signed)
[facility] LCSW Group Therapy Note  Date/Time:  11/10/2018  11:00AM-12:00PM  Type of Therapy and Topic:  Group Therapy:  Music and Mood  Participation Level:  Minimal   Description of Group: In this process group, members listened to a variety of genres of music and identified that different types of music evoke different responses.  Patients were encouraged to identify music that was soothing for them and music that was energizing for them.  Patients discussed how this knowledge can help with wellness and recovery in various ways including managing depression and anxiety as well as encouraging healthy sleep habits.    Therapeutic Goals: 1. Patients will explore the impact of different varieties of music on mood 2. Patients will verbalize the thoughts they have when listening to different types of music 3. Patients will identify music that is soothing to them as well as music that is energizing to them 4. Patients will discuss how to use this knowledge to assist in maintaining wellness and recovery 5. Patients will explore the use of music as a coping skill  Summary of Patient Progress:  At the beginning of group, patient expressed that she felt "okay" and she listened to a few songs then left the room and did not return.  Therapeutic Modalities: Solution Focused Brief Therapy Activity   Ambrose MantleMareida Grossman-Orr, LCSW

## 2018-11-10 NOTE — Progress Notes (Signed)
Adult Psychoeducational Group Note  Date:  11/10/2018 Time:  8:55 PM  Group Topic/Focus:  Wrap-Up Group:   The focus of this group is to help patients review their daily goal of treatment and discuss progress on daily workbooks.  Participation Level:  Active  Participation Quality:  Appropriate  Affect:  Appropriate  Cognitive:  Appropriate  Insight: Appropriate  Engagement in Group:  Engaged  Modes of Intervention:  Socialization and Support  Additional Comments:  Patient attended and participated in group tonight. She reports that the day started off bad but ended up fine. She went for her meals and attended groups.  Stephanie MainsFrancis, [name] [name] 11/10/2018, 8:55 PM

## 2018-11-10 NOTE — Progress Notes (Signed)
[facility] MD Progress Note  11/10/2018 9:06 AM [name]  MRN:  [id number] Subjective:   Remains generally stable in bed denying suicidal thoughts states she will go home tomorrow regardless of housing situation No cravings or withdrawal symptoms no thoughts of harming self or others Principal Problem: Cocaine use disorder, severe, dependence (HCC) Diagnosis:   Patient Active Problem List   Diagnosis Date Noted  . Schizoaffective disorder (HCC) [F25.9] 11/05/2018  . Bacterial vaginosis [N76.0, B96.89] 03/01/2017  . Schizoaffective disorder, bipolar type (HCC) [F25.0] 05/18/2016  . Annual physical exam [Z00.00] 02/15/2016  . Encounter for routine gynecological examination [Z01.419] 02/15/2016  . Breast cancer screening [Z12.39] 02/15/2016  . Affective psychosis, bipolar (HCC) [F31.9]   . Cannabis use disorder, moderate, dependence (HCC) [F12.20] 09/22/2015  . Cocaine use disorder, severe, dependence (HCC) [F14.20] 09/22/2015  . UTI (lower urinary tract infection) [N39.0] 09/04/2012   Total Time spent with patient: 20 minutes  Past Psychiatric History: no new data  Past Medical History:  Past Medical History:  Diagnosis Date  . Anxiety   . Bipolar 1 disorder (HCC)   . Depression   . Drug abuse (HCC)   . Headache   . Schizophrenia [facility])     Past Surgical History:  Procedure Laterality Date  . NO PAST SURGERIES     Family History:  Family History  Problem Relation Age of Onset  . Mental illness Maternal Aunt   . Cancer Maternal Aunt        breast   . Mental illness Paternal Aunt   . Anesthesia problems Neg Hx   . Hypotension Neg Hx   . Malignant hyperthermia Neg Hx   . Pseudochol deficiency Neg Hx     Social History:  Social History   Substance and Sexual Activity  Alcohol Use Yes  . Alcohol/week: 3.0 standard drinks  . Types: 3 Cans of beer per week   Comment: 8-10 can daily     Social History   Substance and Sexual Activity  Drug Use Yes  . Types:  Marijuana, Cocaine   Comment: occasionally    Social History   Socioeconomic History  . Marital status: Single    Spouse name: Not on file  . Number of children: Not on file  . Years of education: Not on file  . Highest education level: Not on file  Occupational History  . Not on file  Social Needs  . Financial resource strain: Not on file  . Food insecurity:    Worry: Not on file    Inability: Not on file  . Transportation needs:    Medical: Not on file    Non-medical: Not on file  Tobacco Use  . Smoking status: Current Every Day Smoker    Packs/day: 0.25    Years: 10.00    Pack years: 2.50    Types: Cigarettes, Cigars  . Smokeless tobacco: Never Used  Substance and Sexual Activity  . Alcohol use: Yes    Alcohol/week: 3.0 standard drinks    Types: 3 Cans of beer per week    Comment: 8-10 can daily  . Drug use: Yes    Types: Marijuana, Cocaine    Comment: occasionally  . Sexual activity: Yes    Birth control/protection: Condom  Lifestyle  . Physical activity:    Days per week: Not on file    Minutes per session: Not on file  . Stress: Not on file  Relationships  . Social connections:    Talks on phone:  Not on file    Gets together: Not on file    Attends religious service: Not on file    Active member of club or organization: Not on file    Attends meetings of clubs or organizations: Not on file    Relationship status: Not on file  Other Topics Concern  . Not on file  Social History Narrative  . Not on file   Additional Social History:    Pain Medications: see MAR Prescriptions: see MAR Over the Counter: see MAR History of alcohol / drug use?: Yes Longest period of sobriety (when/how long): 7-8 months Name of Substance 1: Crack cocaine 1 - Age of First Use: UTA 1 - Amount (size/oz): 3.5 grams 1 - Frequency: UTA 1 - Duration: "years" 1 - Last Use / Amount: 3.5 grams this morning Name of Substance 2: Alcohol 2 - Age of First Use: UTA 2 - Amount  (size/oz): 8 beers 2 - Frequency: daily 2 - Duration: UTA 2 - Last Use / Amount: 11/04/18                Sleep: Good  Appetite:  Good  Current Medications: Current Facility-Administered Medications  Medication Dose Route Frequency Provider Last Rate Last Dose  . feeding supplement (ENSURE ENLIVE) (ENSURE ENLIVE) liquid 237 mL  237 mL Oral BID BM Nwoko, Agnes I, NP   237 mL at 11/09/18 0926  . gabapentin (NEURONTIN) capsule 300 mg  300 mg Oral TID Malvin JohnsFarah, Rudolf Blizard, MD   300 mg at 11/10/18 [id number]  . hydrOXYzine (ATARAX/VISTARIL) tablet 25 mg  25 mg Oral TID PRN Jackelyn PolingBerry, Jason A, NP   25 mg at 11/09/18 2107  . ibuprofen (ADVIL,MOTRIN) tablet 800 mg  800 mg Oral Q6H PRN Nira ConnBerry, Jason A, NP   800 mg at 11/09/18 1650  . multivitamin with minerals tablet 1 tablet  1 tablet Oral Daily Armandina StammerNwoko, Agnes I, NP   1 tablet at 11/10/18 [id number]  . nicotine polacrilex (NICORETTE) gum 2 mg  2 mg Oral PRN Armandina StammerNwoko, Agnes I, NP      . QUEtiapine (SEROQUEL) tablet 200 mg  200 mg Oral QHS Nwoko, Agnes I, NP   200 mg at 11/09/18 2107  . QUEtiapine (SEROQUEL) tablet 25 mg  25 mg Oral Q0600 Armandina StammerNwoko, Agnes I, NP   25 mg at 11/10/18 [id number]    Lab Results: No results found for this or any previous visit (from the past 48 hour(s)).  Blood Alcohol level:  Lab Results  Component Value Date   ETH <10 11/05/2018   ETH <10 04/02/2018    Metabolic Disorder Labs: Lab Results  Component Value Date   HGBA1C 5.7 (H) 04/07/2018   MPG 116.89 04/07/2018   MPG 123 (H) 02/15/2016   Lab Results  Component Value Date   PROLACTIN 8.9 04/07/2018   PROLACTIN 14.3 09/23/2015   Lab Results  Component Value Date   CHOL 219 (H) 04/07/2018   TRIG 177 (H) 04/07/2018   HDL 98 04/07/2018   CHOLHDL 2.2 04/07/2018   VLDL 35 04/07/2018   LDLCALC 86 04/07/2018   LDLCALC 56 02/15/2016    Physical Findings: AIMS: Facial and Oral Movements Muscles of Facial Expression: None, normal Lips and Perioral Area: None, normal Jaw: None,  normal Tongue: None, normal,Extremity Movements Upper (arms, wrists, hands, fingers): None, normal Lower (legs, knees, ankles, toes): None, normal, Trunk Movements Neck, shoulders, hips: None, normal, Overall Severity Severity of abnormal movements (highest score from questions above): None, normal Incapacitation due  to abnormal movements: None, normal Patient's awareness of abnormal movements (rate only patient's report): No Awareness, Dental Status Current problems with teeth and/or dentures?: No Does patient usually wear dentures?: No  CIWA:    COWS:     Musculoskeletal: Strength & Muscle Tone: within normal limits Gait & Station: normal Patient leans: N/A  Psychiatric Specialty Exam: Physical Exam  ROS  Blood pressure (!) 142/90, pulse 71, temperature 98.2 F (36.8 C), temperature source Oral, resp. rate 16, height 5' (1.524 m), weight 49 kg, SpO2 100 %.Body mass index is 21.09 kg/m.  General Appearance: Casual  Eye Contact:  Fair  Speech:  Clear and Coherent  Volume:  Decreased  Mood:  Euthymic  Affect:  Congruent  Thought Process:  Coherent  Orientation:  full  Thought Content:  Logical  Suicidal Thoughts:  No  Homicidal Thoughts:  No  Memory:  Immediate;   Good  Judgement:  Good  Insight:  Good  Psychomotor Activity:  Normal  Concentration:  Concentration: Good  Recall:  Good  Fund of Knowledge:  Good  Language:  Good  Akathisia:  Negative  Handed:  Right  AIMS (if indicated):     Assets:  Desire for change  ADL's:  Intact  Cognition:  WNL  Sleep:  Number of Hours: 6.75     Treatment Plan Summary: Daily contact with patient to assess and evaluate symptoms and progress in treatment and Medication management  Jones Viviani, MD 11/10/2018, 9:06 AM

## 2018-11-11 MED ORDER — CLONIDINE HCL 0.1 MG PO TABS: 0.1000 mg | ORAL_TABLET | Freq: Two times a day (BID) | ORAL | 0 refills | Status: DC

## 2018-11-11 MED ORDER — HYDROXYZINE HCL 25 MG PO TABS: 25.0000 mg | ORAL_TABLET | Freq: Three times a day (TID) | ORAL | 0 refills | Status: DC | PRN

## 2018-11-11 MED ORDER — QUETIAPINE FUMARATE 25 MG PO TABS: 25.0000 mg | ORAL_TABLET | Freq: Every day | ORAL | 0 refills | Status: DC

## 2018-11-11 MED ORDER — ADULT MULTIVITAMIN W/MINERALS CH: 1.0000 | ORAL_TABLET | Freq: Every day | ORAL | Status: DC

## 2018-11-11 MED ORDER — GABAPENTIN 300 MG PO CAPS: 300.0000 mg | ORAL_CAPSULE | Freq: Three times a day (TID) | ORAL | 0 refills | Status: DC

## 2018-11-11 MED ORDER — NICOTINE POLACRILEX 2 MG MT GUM: 2.0000 mg | CHEWING_GUM | OROMUCOSAL | 0 refills | Status: DC | PRN

## 2018-11-11 MED ORDER — QUETIAPINE FUMARATE 200 MG PO TABS: 200.0000 mg | ORAL_TABLET | Freq: Every day | ORAL | 0 refills | Status: DC

## 2018-11-11 MED ORDER — ENSURE PLUS PO LIQD: 237.0000 mL | Freq: Two times a day (BID) | ORAL | 0 refills | Status: DC

## 2018-11-11 MED ORDER — IBUPROFEN 800 MG PO TABS: 800.0000 mg | ORAL_TABLET | Freq: Four times a day (QID) | ORAL | 0 refills | Status: DC | PRN

## 2018-11-11 NOTE — Progress Notes (Signed)
Patient ID: Stephanie GongKimberly Degen, female   DOB: 1961/09/20, 57 y.o.   MRN: [id number]  Pt discharged to lobby. Pt was stable and appreciative at that time. All papers and prescriptions were given and valuables returned. Verbal understanding expressed. Denies SI/HI and A/VH. Pt given opportunity to express concerns and ask questions.

## 2018-11-11 NOTE — Progress Notes (Signed)
Recreation Therapy Notes  Date: 11.18.19 Time: 1000 Location: 500 Hall Dayroom  Group Topic: Coping Skills  Goal Area(s) Addresses:  Patient will be able to identify positive coping skills. Patient will be able to identify benefits of using coping skills post d/c.  Intervention: Worksheet  Activity: Mind map.  LRT and patients filled in the first 8 boxes together (anxiety, depression, stress, loneliness, anger, deceitfulness, finances and change).  Patients were to fill in the remaining boxes individually with coping skills for each issue presented before reconvening as a group.  LRT would then write on the coping skills on the board with their respective issue.  Education: PharmacologistCoping Skills, Building control surveyorDischarge Planning.   Education Outcome: Acknowledges understanding/In group clarification offered/Needs additional education.   Clinical Observations/Feedback:  Pt did not attend group.    Caroll RancherMarjette Valkyrie Guardiola, LRT/CTRS         Caroll RancherLindsay, [place] A 11/11/2018 12:42 PM

## 2018-11-11 NOTE — Progress Notes (Signed)
Patient ID: Stephanie GongKimberly Lobue, female   DOB: Oct 28, 1961, 57 y.o.   MRN: [id number]

## 2018-11-11 NOTE — Progress Notes (Signed)
  [facility] Discharge [facility] :  Will you be returning to the same living situation after discharge:  Yes,  with boyfriend At discharge, do you have transportation home?: No. Bus pass provided.  Do you have the ability to pay for your medications: Yes,  [facility]  Release of information consent forms completed and in the chart;  Patient's signature needed at discharge.  Patient to Follow up at: Follow-up Information    [facility]. Go on 11/15/2018.   Specialty:  Behavioral Health Why:  Please attend your appt on Friday, 11/15/18, at 9:00am. Please bring photo ID and your hospital discharge paperwork.  Contact information: [address] [place] [address] [phone]           Next level of care provider has access to [facility] Link:no  Safety Planning and Suicide Prevention discussed: No.  Have you used any form of tobacco in the last 30 days? (Cigarettes, Smokeless Tobacco, Cigars, and/or Pipes): Yes  Has patient been referred to the Quitline?: Patient refused referral  Patient has been referred for addiction treatment: Yes  Lorri FrederickWierda, [place] Jon, LCSW 11/11/2018, 10:27 AM

## 2018-11-11 NOTE — Progress Notes (Signed)
Recreation Therapy Notes  INPATIENT RECREATION TR PLAN  Patient Details Name: [name] MRN: [id number] DOB: 12/22/1961 Today's Date: 11/11/2018  Rec Therapy Plan Is patient appropriate for Therapeutic Recreation?: Yes Treatment times per week: about 3 days Estimated Length of Stay: 5-7 days TR Treatment/Interventions: Group participation (Comment)  Discharge Criteria Pt will be discharged from therapy if:: Discharged Treatment plan/goals/alternatives discussed and agreed upon by:: Patient/family  Discharge Summary Short term goals set: See patient care plan Short term goals met: Adequate for discharge Progress toward goals comments: Groups attended Which groups?: Other (Comment)(Team building) Reason goals not met: Pt attended one group session Therapeutic equipment acquired: N/A Reason patient discharged from therapy: Discharge from hospital Pt/family agrees with progress & goals achieved: Yes Date patient discharged from therapy: 11/11/18    Victorino Sparrow, LRT/CTRS  Ria Comment, [place] 11/11/2018, 11:04 AM

## 2018-11-11 NOTE — Plan of Care (Signed)
Pt attended one recreation therapy group session and was able to identify some coping skills.   Caroll RancherMarjette Chestina Komatsu, LRT/CTRS

## 2018-11-11 NOTE — Progress Notes (Addendum)
CSW and CSW intern worked with pt at her request on plan for pt to go to [place] after discharge to stay with a friend.  [name], [name], [phone],  was contLancasteracted and agreed that pt could stay with her.  CSW went to speak with pt about transportation this AM and pt reports she has changed her mind and [place] boyfriend, which is where she was [facility].  CSW asked why the change?  Pt reports she doesn't want to go to [place] "with no money in my pocket."  [facility]. Garner NashGregory Jilian [name], MSW, LCSW Clinical Social Worker 11/11/2018 10:20 AM

## 2018-11-11 NOTE — Progress Notes (Signed)
Patient has been up and active on the unit, attended group this evening and was compliant with medication.  Patient currently denies si/hi/a/v hall. Support and encouragement offered, safety maintained on unit, will continue to monitor.

## 2018-11-11 NOTE — Discharge Summary (Signed)
Physician Discharge Summary Note  Patient:  [name] is an 57 y.o., female  MRN:  [id number]  DOB:  1961/11/19  Patient phone:  [phone] (home)   Patient address:   [address] [place] [address],   Total Time spent with patient: Greater than 30 minutes  Date of Admission:  11/05/2018  Date of Discharge:  11/11/2018  Reason for Admission: Worsening symptoms of depression & suicidal ideation triggered by crack cocaine/alcohol binge with plans to overdose on the crack & alcohol.   Principal Problem: Cocaine use disorder, severe, dependence [facility])  Discharge Diagnoses: Patient Active Problem List   Diagnosis Date Noted  . Cocaine use disorder, severe, dependence (HCC) [F14.20] 09/22/2015    Priority: High  . Schizoaffective disorder, bipolar type (HCC) [F25.0] 05/18/2016    Priority: Medium  . Schizoaffective disorder (HCC) [F25.9] 11/05/2018  . Bacterial vaginosis [N76.0, B96.89] 03/01/2017  . Annual physical exam [Z00.00] 02/15/2016  . Encounter for routine gynecological examination [Z01.419] 02/15/2016  . Breast cancer screening [Z12.39] 02/15/2016  . Affective psychosis, bipolar (HCC) [F31.9]   . Cannabis use disorder, moderate, dependence (HCC) [F12.20] 09/22/2015  . UTI (lower urinary tract infection) [N39.0] 09/04/2012   Past Psychiatric History: Bipolar disorder  Past Medical History:  Past Medical History:  Diagnosis Date  . Anxiety   . Bipolar 1 disorder (HCC)   . Depression   . Drug abuse (HCC)   . Headache   . Schizophrenia [facility])     Past Surgical History:  Procedure Laterality Date  . NO PAST SURGERIES     Family History:  Family History  Problem Relation Age of Onset  . Mental illness Maternal Aunt   . Cancer Maternal Aunt        breast   . Mental illness Paternal Aunt   . Anesthesia problems Neg Hx   . Hypotension Neg Hx   . Malignant hyperthermia Neg Hx   . Pseudochol deficiency Neg Hx    Family Psychiatric  History: See  H&P.  Social History:  Social History   Substance and Sexual Activity  Alcohol Use Yes  . Alcohol/week: 3.0 standard drinks  . Types: 3 Cans of beer per week   Comment: 8-10 can daily     Social History   Substance and Sexual Activity  Drug Use Yes  . Types: Marijuana, Cocaine   Comment: occasionally    Social History   Socioeconomic History  . Marital status: Single    Spouse name: Not on file  . Number of children: Not on file  . Years of education: Not on file  . Highest education level: Not on file  Occupational History  . Not on file  Social Needs  . Financial resource strain: Not on file  . Food insecurity:    Worry: Not on file    Inability: Not on file  . Transportation needs:    Medical: Not on file    Non-medical: Not on file  Tobacco Use  . Smoking status: Current Every Day Smoker    Packs/day: 0.25    Years: 10.00    Pack years: 2.50    Types: Cigarettes, Cigars  . Smokeless tobacco: Never Used  Substance and Sexual Activity  . Alcohol use: Yes    Alcohol/week: 3.0 standard drinks    Types: 3 Cans of beer per week    Comment: 8-10 can daily  . Drug use: Yes    Types: Marijuana, Cocaine    Comment: occasionally  . Sexual  activity: Yes    Birth control/protection: Condom  Lifestyle  . Physical activity:    Days per week: Not on file    Minutes per session: Not on file  . Stress: Not on file  Relationships  . Social connections:    Talks on phone: Not on file    Gets together: Not on file    Attends religious service: Not on file    Active member of club or organization: Not on file    Attends meetings of clubs or organizations: Not on file    Relationship status: Not on file  Other Topics Concern  . Not on file  Social History Narrative  . Not on file   Hospital Course:  (Per Md's admission evaluation): This is one of several admission assessments from this hospital alone for [name], a 73 year old AA female with hx of Cocaine use  disorder, chronic, alcohol use disorder & chronic mental illness. She is well known on this unit from previous hospitalizations for mood stabilization treatments/chronic drug use. She receives her maintenance mental health care on an outpatient basis at [facility]. She is currently admitted to the [facility] with complaints of worsening symptoms of depression & suicidal ideation triggered by crack cocaine/alcohol binge with plans to overdose on the crack & alcohol. She is seeking help in resuming her mental health medications. During this assessment, [name] tearfully reports, "I'm not good. I'm sorry that I relapsed on crack & alcohol again, a week ago to be exact. First, I stopped taking my mental health medicines a week ago. I also have not been going to my mental health appointments at [facility] in a month. So, I relapsed on crack again. I have not been sleeping well either. When I'm drugs, I don't eat. I have lost a lot of weight in 1 week.  I wanted to overdose on cocaine/alcohol & get it over with, I drank a case of beer yesterday. I need to get back on my medicines (Seroquel 400 at bedtime & 50 mg in the daytime). I'm not having any substance withdrawal symptoms.  After the above admission assessment, it was determined that [name] will need medication management to re-stabilize her mood. The medication regimen for her presenting symptoms were discussed & initiated. She was medicated & discharged on; Gabapentin 300 mg for agitation, Vistaril 25 mg prn for anxiety, Seroquel 25 mg for agitation & Seroquel 200 mg for mood control. She received other significant medication regimen for the other pre-existing medical issues presented. She tolerated her treatment regimen without any adverse effects or reactions reported. [name] was enrolled & participated in the group counseling sessions being offered & held on this unit. She learned coping skills.  [name] symptoms responded well to her treatment regimen.  This is evidenced by her reports of improved symptoms, absence of hallucinations, delusions & or paranoia. Upon discharge, she denies SIHI. She is currently stable to be discharged to her place of residence to continue mental health care on an outpatient basis as noted below. She is provided with all the necessary information needed to make this appointment without problems. She left [facility] with all personal belongs in no apparent distress.  Physical Findings: AIMS: Facial and Oral Movements Muscles of Facial Expression: None, normal Lips and Perioral Area: None, normal Jaw: None, normal Tongue: None, normal,Extremity Movements Upper (arms, wrists, hands, fingers): None, normal Lower (legs, knees, ankles, toes): None, normal, Trunk Movements Neck, shoulders, hips: None, normal, Overall Severity Severity of abnormal movements (highest  score from questions above): None, normal Incapacitation due to abnormal movements: None, normal Patient's awareness of abnormal movements (rate only patient's report): No Awareness, Dental Status Current problems with teeth and/or dentures?: No Does patient usually wear dentures?: No  CIWA:    COWS:     Musculoskeletal: Strength & Muscle Tone: within normal limits Gait & Station: normal Patient leans: N/A  Psychiatric Specialty Exam: See SRA by MD Physical Exam  Nursing note and vitals reviewed. Constitutional: She is oriented to person, place, and time. She appears well-developed.  HENT:  Head: Normocephalic.  Eyes: Pupils are equal, round, and reactive to light.  Neck: Normal range of motion.  Cardiovascular: Normal rate.  Respiratory: Effort normal.  Genitourinary:  Genitourinary Comments: Deferred  Musculoskeletal: Normal range of motion.  Neurological: She is alert and oriented to person, place, and time.  Skin: Skin is warm.  Psychiatric: She has a normal mood and affect. Her behavior is normal.    Review of Systems  Constitutional:  Negative.   HENT: Positive for sinus pain.   Eyes: Negative.   Respiratory: Negative.  Negative for cough and shortness of breath.   Cardiovascular: Negative.  Negative for chest pain and palpitations.  Gastrointestinal: Negative.  Negative for abdominal pain, heartburn, nausea and vomiting.  Genitourinary: Negative.   Musculoskeletal: Negative.   Skin: Negative.   Neurological: Negative.   Endo/Heme/Allergies: Negative.   Psychiatric/Behavioral: Positive for depression (Stable ), hallucinations (Hx. psychosis (stable) ) and substance abuse (Hx. Cocaine use disorder ). Negative for memory loss and suicidal ideas. The patient has insomnia (Stable). The patient is not nervous/anxious.   All other systems reviewed and are negative.   Blood pressure 133/81, pulse 75, temperature 97.7 F (36.5 C), temperature source Oral, resp. rate 18, height 5' (1.524 m), weight 49 kg, SpO2 100 %.Body mass index is 21.09 kg/m.   Have you used any form of tobacco in the last 30 days? (Cigarettes, Smokeless Tobacco, Cigars, and/or Pipes): Yes  Has this patient used any form of tobacco in the last 30 days? (Cigarettes, Smokeless Tobacco, Cigars, and/or Pipes): Yes, an FDA-approved tobacco cessation medication was offered at discharge.  Blood Alcohol level:  Lab Results  Component Value Date   ETH <10 11/05/2018   ETH <10 04/02/2018   Metabolic Disorder Labs:  Lab Results  Component Value Date   HGBA1C 5.7 (H) 04/07/2018   MPG 116.89 04/07/2018   MPG 123 (H) 02/15/2016   Lab Results  Component Value Date   PROLACTIN 8.9 04/07/2018   PROLACTIN 14.3 09/23/2015   Lab Results  Component Value Date   CHOL 219 (H) 04/07/2018   TRIG 177 (H) 04/07/2018   HDL 98 04/07/2018   CHOLHDL 2.2 04/07/2018   VLDL 35 04/07/2018   LDLCALC 86 04/07/2018   LDLCALC 56 02/15/2016   See Psychiatric Specialty Exam and Suicide Risk Assessment completed by Attending Physician prior to discharge.  Discharge  destination:  Home  Is patient on multiple antipsychotic therapies at discharge:  No   Has Patient had three or more failed trials of antipsychotic monotherapy by history:  No  Recommended Plan for Multiple Antipsychotic Therapies: NA  Allergies as of 11/11/2018   No Known Allergies     Medication List    STOP taking these medications   metroNIDAZOLE 500 MG tablet Commonly known as:  FLAGYL     TAKE these medications     Indication  cloNIDine 0.1 MG tablet Commonly known as:  CATAPRES Take 1 tablet (  0.1 mg total) by mouth 2 (two) times daily. For high blood pressure/anxiety  Indication:  High Blood Pressure Disorder, Anxiety   ENSURE PLUS Liqd Take 237 mLs by mouth 2 (two) times daily between meals. (May buy from over the counter): Nutritional supplement. What changed:  additional instructions  Indication:  Nutritional supplement   gabapentin 300 MG capsule Commonly known as:  NEURONTIN Take 1 capsule (300 mg total) by mouth 3 (three) times daily. For agitation What changed:    medication strength  how much to take  additional instructions  Indication:  Agitation   hydrOXYzine 25 MG tablet Commonly known as:  ATARAX/VISTARIL Take 1 tablet (25 mg total) by mouth 3 (three) times daily as needed for anxiety.  Indication:  Feeling Anxious   ibuprofen 800 MG tablet Commonly known as:  ADVIL,MOTRIN Take 1 tablet (800 mg total) by mouth every 6 (six) hours as needed for moderate pain. (May buy from over the counter): For pain  Indication:  pain   multivitamin with minerals Tabs tablet Take 1 tablet by mouth daily. Vitamin supplement Start taking on:  11/12/2018 What changed:  additional instructions  Indication:  mult   nicotine polacrilex 2 MG gum Commonly known as:  NICORETTE Take 1 each (2 mg total) by mouth as needed for smoking cessation. (May buy from over the counter): For smoking cessation What changed:  additional instructions  Indication:  Nicotine  Addiction   QUEtiapine 200 MG tablet Commonly known as:  SEROQUEL Take 1 tablet (200 mg total) by mouth at bedtime. For mood control What changed:    medication strength  how much to take  additional instructions  Indication:  Mood control   QUEtiapine 25 MG tablet Commonly known as:  SEROQUEL Take 1 tablet (25 mg total) by mouth daily at 6 (six) AM. For agitation Start taking on:  11/12/2018 What changed:  You were already taking a medication with the same name, and this prescription was added. Make sure you understand how and when to take each.  Indication:  Agitation      Follow-up recommendations: Activity:  As tolerated Diet: As recommended by your primary care doctor. Keep all scheduled follow-up appointments as recommended.   Comments: Patient is instructed prior to discharge to: Take all medications as prescribed by his/her mental healthcare provider. Report any adverse effects and or reactions from the medicines to his/her outpatient provider promptly. Patient has been instructed & cautioned: To not engage in alcohol and or illegal drug use while on prescription medicines. In the event of worsening symptoms, patient is instructed to call the crisis hotline, 911 and or go to the nearest ED for appropriate evaluation and treatment of symptoms. To follow-up with his/her primary care provider for your other medical issues, concerns and or health care needs.    Signed: Armandina Stammer, NP, PMHNP, FNP-BC 11/11/2018, 9:28 AM

## 2018-11-11 NOTE — Progress Notes (Signed)
Patient ID: [name], female   DOB: April 18, 1961, 57 y.o.   MRN: [id number]   Pt currently presents with an appropriate affect and restless behavior. Reports ongoing chronic pain. Pt states goal is to "go home today." Pt is at baseline.   Pt provided with medications per providers orders. Pt's labs and vitals were monitored during the shift. Pt given a 1:1 about emotional and mental status. Pt supported and encouraged to express concerns and questions. Pt educated on medications.  Pt's safety ensured with 15 minute and environmental checks. Pt currently denies SI/HI and A/V hallucinations. Pt verbally agrees to seek staff if SI/HI or A/VH occurs and to consult with staff before acting on any harmful thoughts. MD to discharge patient. Will continue POC.

## 2018-11-11 NOTE — [facility] Suicide Risk Assessment (Signed)
[facility]   Principal Problem: Cocaine use disorder, severe, dependence (HCC) Discharge Diagnoses: MDD Patient Active Problem List   Diagnosis Date Noted  . Schizoaffective disorder (HCC) [F25.9] 11/05/2018  . Bacterial vaginosis [N76.0, B96.89] 03/01/2017  . Schizoaffective disorder, bipolar type (HCC) [F25.0] 05/18/2016  . Annual physical exam [Z00.00] 02/15/2016  . Encounter for routine gynecological examination [Z01.419] 02/15/2016  . Breast cancer screening [Z12.39] 02/15/2016  . Affective psychosis, bipolar (HCC) [F31.9]   . Cannabis use disorder, moderate, dependence (HCC) [F12.20] 09/22/2015  . Cocaine use disorder, severe, dependence (HCC) [F14.20] 09/22/2015  . UTI (lower urinary tract infection) [N39.0] 09/04/2012    Total Time spent with patient: 30 minutes  Musculoskeletal: Strength & Muscle Tone: within normal limits Gait & Station: normal Patient leans: Right  Psychiatric Specialty Exam: ROS  Blood pressure 133/81, pulse 75, temperature 97.7 F (36.5 C), temperature source Oral, resp. rate 18, height 5' (1.524 m), weight 49 kg, SpO2 100 %.Body mass index is 21.09 kg/m.  General Appearance: Neat  Eye Contact::  Minimal  Speech:  Clear and Coherent409  Volume:  Normal  Mood:  Euthymic  Affect:  Constricted  Thought Process:  Coherent  Orientation:  Negative  Thought Content:  Logical  Suicidal Thoughts:  No  Homicidal Thoughts:  No  Memory:  Immediate;   Fair  Judgement:  Fair  Insight:  Fair  Psychomotor Activity:  Normal  Concentration:  Good  Recall:  Good  Fund of Knowledge:Good  Language: Good  Akathisia:  Negative  Handed:  Right  AIMS (if indicated):     Assets:  Communication Skills  Sleep:  Number of Hours: 6.75  Cognition: WNL  ADL's:  Intact   Mental Status Per Nursing Assessment::   On Admission:  NA  Demographic Factors:  Low socioeconomic status  Loss Factors: Decrease in vocational  status  Historical Factors: NA  Risk Reduction Factors:   NA  Continued Clinical Symptoms:  Personality Disorders:   Cluster B  Cognitive Features That Contribute To Risk:  None    Suicide Risk:  Minimal: No identifiable suicidal ideation.  Patients presenting with no risk factors but with morbid ruminations; may be classified as minimal risk based on the severity of the depressive symptoms    Plan Of Care/Follow-up recommendations:  Activity:  full  Mlissa Tamayo, MD 11/11/2018, 8:52 AM

## 2018-12-17 ENCOUNTER — Emergency Department (HOSPITAL_COMMUNITY)
Admission: EM | Admit: 2018-12-17 | Discharge: 2018-12-17 | Disposition: A | Discharge status: Home / Self Care | Payer: Medicare Other | Attending: Emergency Medicine | Admitting: Emergency Medicine

## 2018-12-17 ENCOUNTER — Encounter (HOSPITAL_COMMUNITY): Payer: Self-pay | Admitting: Emergency Medicine

## 2018-12-17 DIAGNOSIS — F17290 Nicotine dependence, other tobacco product, uncomplicated: Secondary | ICD-10-CM | POA: Insufficient documentation

## 2018-12-17 DIAGNOSIS — H579 Unspecified disorder of eye and adnexa: Secondary | ICD-10-CM | POA: Diagnosis present

## 2018-12-17 DIAGNOSIS — Z79899 Other long term (current) drug therapy: Secondary | ICD-10-CM | POA: Insufficient documentation

## 2018-12-17 MED ORDER — FLUORESCEIN SODIUM 1 MG OP STRP
1.0000 | ORAL_STRIP | Freq: Once | OPHTHALMIC | Status: AC
  Administered 2018-12-17: 1 via OPHTHALMIC
  Filled 2018-12-17: qty 1

## 2018-12-17 MED ORDER — TETRACAINE HCL 0.5 % OP SOLN
2.0000 [drp] | Freq: Once | OPHTHALMIC | Status: AC
  Administered 2018-12-17: 2 [drp] via OPHTHALMIC
  Filled 2018-12-17: qty 4

## 2018-12-17 MED ORDER — ERYTHROMYCIN 5 MG/GM OP OINT: TOPICAL_OINTMENT | OPHTHALMIC | 0 refills | Status: DC

## 2018-12-17 NOTE — ED Provider Notes (Signed)
[facility] Provider Note   CSN: [id number] Arrival date & time: 12/17/18  1638     History   Chief Complaint Chief Complaint  Patient presents with  . Foreign Body in Eye    HPI Hollice GongKimberly Winders is a 57 y.o. female.  HPI   Patient is a 57 year old female with history of anxiety/depression, bipolar 1 disorder, drug abuse, headache, schizophrenia, who presents the emergency department today for evaluation of right eye pain.  States she started have foreign body sensation to the eye yesterday.  States that because of the pain she taped her eyes shut and put a an eye patch over her eye.  This a.m. she was unable to open her right eye.  States that she felt like she had some blurred vision yesterday because of the foreign body sensation.  No headaches.  No nausea vomiting.  No numbness or weakness in the arms or legs.  No recent URI symptoms.  No flashes or floaters.  She wears glasses but does not wear contacts.  Past Medical History:  Diagnosis Date  . Anxiety   . Bipolar 1 disorder (HCC)   . Depression   . Drug abuse (HCC)   . Headache   . Schizophrenia [facility](HCC)     Patient Active Problem List   Diagnosis Date Noted  . Schizoaffective disorder (HCC) 11/05/2018  . Bacterial vaginosis 03/01/2017  . Schizoaffective disorder, bipolar type (HCC) 05/18/2016  . Annual physical exam 02/15/2016  . Encounter for routine gynecological examination 02/15/2016  . Breast cancer screening 02/15/2016  . Affective psychosis, bipolar (HCC)   . Cannabis use disorder, moderate, dependence (HCC) 09/22/2015  . Cocaine use disorder, severe, dependence (HCC) 09/22/2015  . UTI (lower urinary tract infection) 09/04/2012    Past Surgical History:  Procedure Laterality Date  . NO PAST SURGERIES       OB History   No obstetric history on file.      Home Medications    Prior to Admission medications   Medication Sig Start Date End Date Taking?  Authorizing Provider  cloNIDine (CATAPRES) 0.1 MG tablet Take 1 tablet (0.1 mg total) by mouth 2 (two) times daily. For high blood pressure/anxiety 11/11/18   Armandina StammerNwoko, Agnes I, NP  ENSURE PLUS (ENSURE PLUS) LIQD Take 237 mLs by mouth 2 (two) times daily between meals. (May buy from over the counter): Nutritional supplement. 11/11/18   Armandina StammerNwoko, Agnes I, NP  erythromycin ophthalmic ointment Place a 1/2 inch ribbon of ointment into the lower eyelid 4 times daily for the next 7 days 12/17/18   Klover Priestly S, PA-C  gabapentin (NEURONTIN) 300 MG capsule Take 1 capsule (300 mg total) by mouth 3 (three) times daily. For agitation 11/11/18   Armandina StammerNwoko, Agnes I, NP  hydrOXYzine (ATARAX/VISTARIL) 25 MG tablet Take 1 tablet (25 mg total) by mouth 3 (three) times daily as needed for anxiety. 11/11/18   Armandina StammerNwoko, Agnes I, NP  ibuprofen (ADVIL,MOTRIN) 800 MG tablet Take 1 tablet (800 mg total) by mouth every 6 (six) hours as needed for moderate pain. (May buy from over the counter): For pain 11/11/18   Armandina StammerNwoko, Agnes I, NP  Multiple Vitamin (MULTIVITAMIN WITH MINERALS) TABS tablet Take 1 tablet by mouth daily. Vitamin supplement 11/12/18   Armandina StammerNwoko, Agnes I, NP  nicotine polacrilex (NICORETTE) 2 MG gum Take 1 each (2 mg total) by mouth as needed for smoking cessation. (May buy from over the counter): For smoking cessation 11/11/18   Armandina StammerNwoko, Agnes  I, NP  QUEtiapine (SEROQUEL) 200 MG tablet Take 1 tablet (200 mg total) by mouth at bedtime. For mood control 11/11/18   Armandina Stammer I, NP  QUEtiapine (SEROQUEL) 25 MG tablet Take 1 tablet (25 mg total) by mouth daily at 6 (six) AM. For agitation 11/12/18   Sanjuana Kava, NP    Family History Family History  Problem Relation Age of Onset  . Mental illness Maternal Aunt   . Cancer Maternal Aunt        breast   . Mental illness Paternal Aunt   . Anesthesia problems Neg Hx   . Hypotension Neg Hx   . Malignant hyperthermia Neg Hx   . Pseudochol deficiency Neg Hx     Social  History Social History   Tobacco Use  . Smoking status: Current Every Day Smoker    Packs/day: 0.25    Years: 10.00    Pack years: 2.50    Types: Cigarettes, Cigars  . Smokeless tobacco: Never Used  Substance Use Topics  . Alcohol use: Yes    Alcohol/week: 3.0 standard drinks    Types: 3 Cans of beer per week    Comment: 8-10 can daily  . Drug use: Yes    Types: Marijuana, Cocaine    Comment: occasionally     Allergies   Patient has no known allergies.   Review of Systems Review of Systems  Constitutional: Negative for chills and fever.  HENT: Negative for congestion, rhinorrhea and sore throat.   Eyes: Positive for pain, discharge, redness and visual disturbance. Negative for photophobia and itching.  Gastrointestinal: Negative for nausea and vomiting.  Genitourinary: Negative for flank pain.  Musculoskeletal: Negative for neck stiffness.  Neurological: Negative for dizziness, weakness, light-headedness, numbness and headaches.     Physical Exam Updated Vital Signs BP (!) 142/96 (BP Location: Right Arm)   Pulse 64   Temp 98.2 F (36.8 C) (Oral)   Resp 18   Ht 5' (1.524 m)   Wt 54.4 kg   SpO2 100%   BMI 23.44 kg/m   Physical Exam Vitals signs and nursing note reviewed.  Constitutional:      General: She is not in acute distress.    Appearance: She is well-developed.  HENT:     Head: Normocephalic and atraumatic.  Eyes:     General:        Right eye: Discharge present.        Left eye: No discharge.     Extraocular Movements: Extraocular movements intact.     Pupils: Pupils are equal, round, and reactive to light.     Comments: Eyelid is crusted shut by purulent discharge.  Conjunctive of the right eye is erythematous and there are areas of subconjunctival hemorrhage.  No foreign body noted.  Lid was everted and no foreign body was noted.  Fluorescein stain was completed and eye was visualized under wood's lamp without evidence of uptake.  Visual acuity  20/40 with bilaterally and individually.  Eye pressures normal bilat, L: 15 mmHg, R; 21 mmHg.  No photophobia, no consensual photophobia.  Neck:     Musculoskeletal: Neck supple.  Cardiovascular:     Rate and Rhythm: Normal rate.  Pulmonary:     Effort: Pulmonary effort is normal.  Musculoskeletal: Normal range of motion.  Skin:    General: Skin is warm and dry.  Neurological:     General: No focal deficit present.     Mental Status: She is alert and oriented  to person, place, and time.     Cranial Nerves: No cranial nerve deficit.      ED Treatments / Results  Labs (all labs ordered are listed, but only abnormal results are displayed) Labs Reviewed - No data to display  EKG None  Radiology No results found.  Procedures Procedures (including critical care time)  Medications Ordered in ED Medications  tetracaine (PONTOCAINE) 0.5 % ophthalmic solution 2 drop (has no administration in time range)  fluorescein ophthalmic strip 1 strip (1 strip Right Eye Given 12/17/18 1833)     Initial Impression / Assessment and Plan / ED Course  I have reviewed the triage vital signs and the nursing notes.  Pertinent labs & imaging results that were available during my care of the patient were reviewed by me and considered in my medical decision making (see chart for details).   Final Clinical Impressions(s) / ED Diagnoses   Final diagnoses:  Sensation of foreign body in eye   Patient presenting with foreign body sensation in the right eye that began yesterday.  Does not remember getting a foreign body in her eye.  Eye was crusted shut this morning with discharge.  On exam conjunctive is erythematous and there are several areas of, conjunctival hemorrhage.  On fluorescein staining there was no uptake.  Visual acuity was normal.  Pressures are normal.  No photophobia or consensual photophobia.  Given the drainage, will treat her with erythromycin ointment for possible bacterial  conjunctivitis.  We will have her follow-up with ophthalmology as an outpatient.  Advised to return to the ER for new or worsening symptoms the meantime.  She voiced understanding of plan reasons return the ED.  Questions answered.  ED Discharge Orders         Ordered    erythromycin ophthalmic ointment     12/17/18 [address]1829           Lilyauna Miedema S, PA-C 12/17/18 1834    Cathren LaineSteinl, Kevin, MD 12/23/18 [id number]

## 2018-12-17 NOTE — ED Notes (Signed)
Pt stable and ambulatory for discharge, states understanding follow up.  

## 2018-12-17 NOTE — ED Triage Notes (Signed)
Pt has unknown foreign body stuck in R eye. Cannot open eye currently

## 2018-12-17 NOTE — Discharge Instructions (Signed)
You were given a prescription for antibiotics. Please take the antibiotic prescription fully.   You were given a referral to an eye doctor.  Please call the office tomorrow to make an appointment for follow-up.  If you have any new or worsening symptoms in the meantime including fevers, headaches, changes in your vision, you need to return to the emergency department immediately.

## 2019-08-12 ENCOUNTER — Ambulatory Visit: Payer: Self-pay | Admitting: Family Medicine

## 2019-08-26 ENCOUNTER — Encounter: Payer: Self-pay | Admitting: Family Medicine

## 2019-10-03 ENCOUNTER — Other Ambulatory Visit: Payer: Self-pay

## 2019-10-03 ENCOUNTER — Encounter (HOSPITAL_COMMUNITY): Payer: Self-pay

## 2019-10-03 ENCOUNTER — Observation Stay (HOSPITAL_COMMUNITY)
Admission: RE | Admit: 2019-10-03 | Discharge: 2019-10-04 | Disposition: A | Discharge status: Home / Self Care | Payer: Medicare Other | Attending: Psychiatry | Admitting: Psychiatry

## 2019-10-03 DIAGNOSIS — Z20828 Contact with and (suspected) exposure to other viral communicable diseases: Secondary | ICD-10-CM | POA: Insufficient documentation

## 2019-10-03 DIAGNOSIS — F1420 Cocaine dependence, uncomplicated: Secondary | ICD-10-CM | POA: Diagnosis not present

## 2019-10-03 DIAGNOSIS — F250 Schizoaffective disorder, bipolar type: Principal | ICD-10-CM | POA: Insufficient documentation

## 2019-10-03 DIAGNOSIS — F17210 Nicotine dependence, cigarettes, uncomplicated: Secondary | ICD-10-CM | POA: Diagnosis not present

## 2019-10-03 DIAGNOSIS — F419 Anxiety disorder, unspecified: Secondary | ICD-10-CM | POA: Insufficient documentation

## 2019-10-03 DIAGNOSIS — G4700 Insomnia, unspecified: Secondary | ICD-10-CM | POA: Insufficient documentation

## 2019-10-03 DIAGNOSIS — Z79899 Other long term (current) drug therapy: Secondary | ICD-10-CM | POA: Diagnosis not present

## 2019-10-03 DIAGNOSIS — F319 Bipolar disorder, unspecified: Secondary | ICD-10-CM | POA: Insufficient documentation

## 2019-10-03 LAB — CBC
HCT: 44.8 % (ref 36.0–46.0)
Hemoglobin: 14.1 g/dL (ref 12.0–15.0)
MCH: 29.3 pg (ref 26.0–34.0)
MCHC: 31.5 g/dL (ref 30.0–36.0)
MCV: 93.1 fL (ref 80.0–100.0)
Platelets: 233 10*3/uL (ref 150–400)
RBC: 4.81 MIL/uL (ref 3.87–5.11)
RDW: 14.8 % (ref 11.5–15.5)
WBC: 5.5 10*3/uL (ref 4.0–10.5)
nRBC: 0 % (ref 0.0–0.2)

## 2019-10-03 LAB — COMPREHENSIVE METABOLIC PANEL
ALT: 27 U/L (ref 0–44)
AST: 29 U/L (ref 15–41)
Albumin: 3.8 g/dL (ref 3.5–5.0)
Alkaline Phosphatase: 65 U/L (ref 38–126)
Anion gap: 9 (ref 5–15)
BUN: 17 mg/dL (ref 6–20)
CO2: 26 mmol/L (ref 22–32)
Calcium: 9.4 mg/dL (ref 8.9–10.3)
Chloride: 105 mmol/L (ref 98–111)
Creatinine, Ser: 0.8 mg/dL (ref 0.44–1.00)
GFR calc Af Amer: >60 mL/min (ref >60)
GFR calc non Af Amer: >60 mL/min (ref >60)
Glucose, Bld: 115 mg/dL — ABNORMAL HIGH (ref 70–99)
Potassium: 4 mmol/L (ref 3.5–5.1)
Sodium: 140 mmol/L (ref 135–145)
Total Bilirubin: 0.3 mg/dL (ref 0.3–1.2)
Total Protein: 6.7 g/dL (ref 6.5–8.1)

## 2019-10-03 LAB — HEMOGLOBIN A1C
Hgb A1c MFr Bld: 5.9 % — ABNORMAL HIGH (ref 4.8–5.6)
Mean Plasma Glucose: 122.63 mg/dL

## 2019-10-03 LAB — LIPID PANEL
Cholesterol: 165 mg/dL (ref 0–200)
HDL: 90 mg/dL (ref >40)
Total CHOL/HDL Ratio: 1.8 RATIO
Triglycerides: 134 mg/dL (ref <150)
VLDL: 27 mg/dL (ref 0–40)

## 2019-10-03 LAB — ETHANOL: Alcohol, Ethyl (B): <10 mg/dL (ref <10)

## 2019-10-03 LAB — SARS CORONAVIRUS 2 BY RT PCR (HOSPITAL ORDER, PERFORMED IN ~~LOC~~ HOSPITAL LAB): SARS Coronavirus 2: NEGATIVE

## 2019-10-03 LAB — TSH: TSH: 1.132 u[IU]/mL (ref 0.350–4.500)

## 2019-10-03 MED ORDER — GUAIFENESIN ER 600 MG PO TB12
600.0000 mg | ORAL_TABLET | Freq: Two times a day (BID) | ORAL | Status: DC | PRN
  Filled 2019-10-03: qty 1

## 2019-10-03 MED ORDER — MAGNESIUM HYDROXIDE 400 MG/5ML PO SUSP: 30.0000 mL | Freq: Every day | ORAL | Status: DC | PRN

## 2019-10-03 MED ORDER — HYDROXYZINE HCL 25 MG PO TABS: 25.0000 mg | ORAL_TABLET | Freq: Three times a day (TID) | ORAL | Status: DC | PRN

## 2019-10-03 MED ORDER — ADULT MULTIVITAMIN W/MINERALS CH
1.0000 | ORAL_TABLET | Freq: Every day | ORAL | Status: DC
  Administered 2019-10-04: 1 via ORAL
  Filled 2019-10-03: qty 1

## 2019-10-03 MED ORDER — LORATADINE 10 MG PO TABS
10.0000 mg | ORAL_TABLET | Freq: Every day | ORAL | Status: DC
  Administered 2019-10-04: 10 mg via ORAL
  Filled 2019-10-03: qty 1

## 2019-10-03 MED ORDER — QUETIAPINE FUMARATE 200 MG PO TABS
200.0000 mg | ORAL_TABLET | Freq: Every day | ORAL | Status: DC
  Administered 2019-10-03: 200 mg via ORAL
  Filled 2019-10-03: qty 1

## 2019-10-03 MED ORDER — ALUM & MAG HYDROXIDE-SIMETH 200-200-20 MG/5ML PO SUSP: 30.0000 mL | ORAL | Status: DC | PRN

## 2019-10-03 MED ORDER — ACETAMINOPHEN 325 MG PO TABS
650.0000 mg | ORAL_TABLET | Freq: Four times a day (QID) | ORAL | Status: DC | PRN
  Administered 2019-10-04: 650 mg via ORAL
  Filled 2019-10-03: qty 2

## 2019-10-03 NOTE — BH Assessment (Signed)
Assessment Note  [name] is an 58 y.o. female presents to [facility] voluntarily. Pt reports SI with thoughts of cutting her wrists or overdosing. Pt reports hearing voices. Pt reports hx of Schizophrenia diagnosis and cocaine use. Pt reports being off her medication and arguing with her daughter that she lives with who is bipolar and not on medication. Pt has services at [facility] and multiple inpatient services at [facility].  Pt does not works and receives disability. Pt denies abuse. Pt denies homicidal thoughts or physical aggression. Pt denies having access to firearms. Pt denies having any legal problems at this time.  Pt is dressed in street clothes, alert, oriented x4 with rapid speech and restless motor behavior. Eye contact is good and Pt is restless. Pt's mood is depressed and affect is anxious. Thought process is coherent and relevant. Pt's insight is poor and judgement is fair. Pt was cooperative throughout assessment. She says she is willing to sign voluntarily into a psychiatric facility.  Diagnosis:  F25.0 Schizoaffective disorder, Bipolar type  Past Medical History:  Past Medical History:  Diagnosis Date  . Anxiety   . Bipolar 1 disorder ([place])   . Depression   . Drug abuse ([place])   . Headache   . Schizophrenia [facility])     Past Surgical History:  Procedure Laterality Date  . NO PAST SURGERIES      Family History:  Family History  Problem Relation Age of Onset  . Mental illness Maternal Aunt   . Cancer Maternal Aunt        breast   . Mental illness Paternal Aunt   . Anesthesia problems Neg Hx   . Hypotension Neg Hx   . Malignant hyperthermia Neg Hx   . Pseudochol deficiency Neg Hx     Social History:  reports that she has been smoking cigarettes and cigars. She has a 2.50 pack-year smoking history. She has never used smokeless tobacco. She reports current alcohol use of about 3.0 standard drinks of alcohol per week. She reports current drug use. Drugs: Marijuana and  Cocaine.  Additional Social History:  Alcohol / Drug Use Pain Medications: See MAR Prescriptions: See MAR Over the Counter: See MAR History of alcohol / drug use?: Yes Substance #1 Name of Substance 1: cocaine 1 - Age of First Use: 32 1 - Amount (size/oz): ukn 1 - Frequency: ongoing 1 - Duration: ukn 1 - Last Use / Amount: week ago  CIWA:   COWS:    Allergies: No Known Allergies  Home Medications:  Medications Prior to Admission  Medication Sig Dispense Refill  . cloNIDine (CATAPRES) 0.1 MG tablet Take 1 tablet (0.1 mg total) by mouth 2 (two) times daily. For high blood pressure/anxiety 60 tablet 0  . ENSURE PLUS (ENSURE PLUS) LIQD Take 237 mLs by mouth 2 (two) times daily between meals. (May buy from over the counter): Nutritional supplement.  0  . erythromycin ophthalmic ointment Place a 1/2 inch ribbon of ointment into the lower eyelid 4 times daily for the next 7 days 3.5 g 0  . gabapentin (NEURONTIN) 300 MG capsule Take 1 capsule (300 mg total) by mouth 3 (three) times daily. For agitation 90 capsule 0  . hydrOXYzine (ATARAX/VISTARIL) 25 MG tablet Take 1 tablet (25 mg total) by mouth 3 (three) times daily as needed for anxiety. 90 tablet 0  . ibuprofen (ADVIL,MOTRIN) 800 MG tablet Take 1 tablet (800 mg total) by mouth every 6 (six) hours as needed for moderate pain. (May buy  from over the counter): For pain 30 tablet 0  . Multiple Vitamin (MULTIVITAMIN WITH MINERALS) TABS tablet Take 1 tablet by mouth daily. Vitamin supplement    . nicotine polacrilex (NICORETTE) 2 MG gum Take 1 each (2 mg total) by mouth as needed for smoking cessation. (May buy from over the counter): For smoking cessation 100 tablet 0  . QUEtiapine (SEROQUEL) 200 MG tablet Take 1 tablet (200 mg total) by mouth at bedtime. For mood control 30 tablet 0  . QUEtiapine (SEROQUEL) 25 MG tablet Take 1 tablet (25 mg total) by mouth daily at 6 (six) AM. For agitation 30 tablet 0    OB/GYN Status:  No LMP recorded.  (Menstrual status: Perimenopausal).  General Assessment Data [facility] Assessment: In system Is this a Tele or Face-to-Face Assessment?: Face-to-Face Is this an Initial Assessment or a Re-assessment for this encounter?: Initial Assessment Language Other than English: No What gender do you identify as?: Female Pregnancy Status: Unknown Living Arrangements: Non-relatives/Friends Can pt return to current living arrangement?: Yes Admission Status: Voluntary Is patient capable of signing voluntary admission?: Yes Referral Source: Self/Family/Friend Insurance type: Medicaid  Medical Screening Exam [facility] Walk-in ONLY) Medical Exam completed: Yes  Crisis Care Plan Living Arrangements: Non-relatives/Friends [facility] of [facility]rapist: [facility]  Education Status Is patient currently in school?: No Is the patient employed, unemployed or receiving disability?: Receiving disability income  Risk to self with the past 6 months Suicidal Ideation: Yes-Currently Present Has patient been a risk to self within the past 6 months prior to admission? : No Suicidal Intent: No Has patient had any suicidal intent within the past 6 months prior to admission? : No Is patient at risk for suicide?: (Unsure) Suicidal Plan?: Yes-Currently Present Has patient had any suicidal plan within the past 6 months prior to admission? : No Specify Current Suicidal Plan: Cut wrists overdose Access to Means: Yes Specify Access to Suicidal Means: Seroquel Cocaine What has been your use of drugs/alcohol within the last 12 months?: Cociane Previous Attempts/Gestures: Yes How many times?: ("A lot") Triggers for Past Attempts: (Diagnosis) Intentional Self Injurious Behavior: (SA) Family Suicide History: No Recent stressful life event(s): Conflict (Comment), Trauma (Comment)(Diagnosis) Persecutory voices/beliefs?: No Depression: Yes Depression Symptoms:  Despondent, Loss of interest in usual pleasures, Feeling worthless/self pity, Feeling angry/irritable Substance abuse history and/or treatment for substance abuse?: Yes Suicide prevention information given to non-admitted patients: Not applicable  Risk to Others within the past 6 months Homicidal Ideation: No Does patient have any lifetime risk of violence toward others beyond the six months prior to admission? : No Thoughts of Harm to Others: No Current Homicidal Intent: No Current Homicidal Plan: No Access to Homicidal Means: No History of harm to others?: No Assessment of Violence: None Noted Does patient have access to weapons?: No Criminal Charges Pending?: No Does patient have a court date: No Is patient on probation?: No  Psychosis Hallucinations: Visual, Auditory Delusions: None noted  Mental Status Report Appearance/Hygiene: Unremarkable Eye Contact: Good Motor Activity: Freedom of movement, Restlessness Speech: Logical/coherent, Rapid Level of Consciousness: Alert Mood: Depressed Affect: Anxious Anxiety Level: Minimal Thought Processes: Coherent, Relevant Judgement: Partial Orientation: Person, Place, Time, Situation, Appropriate for developmental age Obsessive Compulsive Thoughts/Behaviors: None  Cognitive Functioning Concentration: Normal Memory: Recent Intact Is patient IDD: No Insight: Poor Impulse Control: Poor Appetite: Good Have you had any weight changes? : No Change Sleep: Decreased Total Hours of Sleep: 0(Pt rpeorts no sleep for  4 days) Vegetative Symptoms: None  ADLScreening [facility] Assessment Services) Patient's cognitive ability adequate to safely complete daily activities?: Yes Patient able to express need for assistance with ADLs?: Yes Independently performs ADLs?: Yes (appropriate for developmental age)  Prior Inpatient Therapy Prior Inpatient Therapy: Yes Prior Therapy Dates: 2020 Prior Therapy Facilty/Provider(s): [facility] Reason for  Treatment: Schitzophrenia  Prior Outpatient Therapy Prior Outpatient Therapy: Yes Prior Therapy Dates: Ongoing Prior Therapy Facilty/Provider(s): [facility] Reason for Treatment: Schitzophrenia Does patient have an ACCT team?: Unknown Does patient have Intensive In-House Services?  : Unknown Does patient have [facility] services? : Yes Does patient have P4CC services?: Unknown  ADL Screening (condition at time of admission) Patient's cognitive ability adequate to safely complete daily activities?: Yes Is the patient deaf or have difficulty hearing?: No Does the patient have difficulty seeing, even when wearing glasses/contacts?: No Does the patient have difficulty concentrating, remembering, or making decisions?: No Patient able to express need for assistance with ADLs?: Yes Does the patient have difficulty dressing or bathing?: No Independently performs ADLs?: Yes (appropriate for developmental age) Does the patient have difficulty walking or climbing stairs?: No Weakness of Legs: None Weakness of Arms/Hands: None  Home Assistive Devices/Equipment Home Assistive Devices/Equipment: None  Therapy Consults (therapy consults require a physician order) PT Evaluation Needed: No OT Evalulation Needed: No SLP Evaluation Needed: No Abuse/Neglect Assessment (Assessment to be complete while patient is alone) Abuse/Neglect Assessment Can Be Completed: Yes Physical Abuse: Denies Verbal Abuse: Denies Sexual Abuse: Denies Exploitation of patient/patient's resources: Denies Self-Neglect: Denies Values / Beliefs Cultural Requests During Hospitalization: None Spiritual Requests During Hospitalization: None Consults Spiritual Care Consult Needed: No Social Work Consult Needed: No Merchant navy officer (For Healthcare) Does Patient Have a Medical Advance Directive?: No Does patient want to make changes to medical advance directive?: No - Patient declined Would patient like information on creating  a medical advance directive?: No - Patient declined          Disposition: Shuvon Rankin, NP pt is to be observed overnight and revaluated in the AM.  Disposition Initial Assessment Completed for this Encounter: Yes Disposition of Patient: Admit(Observation)  On Site Evaluation by:   Reviewed with Physician:    Danae Orleans, MA, [facility] 10/03/2019 4:32 PM

## 2019-10-03 NOTE — Plan of Care (Signed)
[place]  Reason for Crisis Plan:  Crisis Stabilization   Plan of Care:  Referral for Inpatient Hospitalization  Family Support:      Current Living Environment:  Living Arrangements: Non-relatives/Friends  Insurance:   Hospital Account    Name Acct ID Class Status Primary Coverage   [name], [name] [id number] [place]        Guarantor Account (for Hospital Account [id number])    Name Relation to Pt Service Area Active? Acct Type   [name] Self [facility] Yes [facility]   Address Phone       [address] [place], [place] [address] [phone])          Coverage Information (for Hospital Account [id number])    F/O Payor/Plan Precert #   [facility] [place] #   [name], [name] [id number]   Address [place] [address] [phone]      Legal Guardian:     Primary Care Provider:  Dorena Dew, FNP  Current Outpatient Providers:  [name]  Psychiatrist:  Name of Psychiatrist: Beverly [name]  Counselor/Therapist:  Name of Therapist: Beverly [name]  Compliant with Medications:  No  Additional Information:   [name] 10/9/20209:27 PM

## 2019-10-03 NOTE — Progress Notes (Signed)
Patient ID: [name], female   DOB: 1961/08/09, 58 y.o.   MRN: [id number] [name] presents to as a walk in to [facility] with complaints of SI thinking about walking into traffic. [name] states that her daughter lives with her and is off her medications. Also [name] informed Probation officer that she and her daughter has been arguing and that her daughter is off her medication and needs to be admitted to the hospital. [name] denies SI at this time. "I feel safe here I just want to get back on my medication." Denies HI but states that she her voices "telling me people are after me." Skin assessment completed: Skin intact no open areas noted. Old scars noted to mid back, both shoulders, left upper chest, and right upper breast. Pt wears glasses. [name] able to contract for safety. Will continue to monitor for safety.

## 2019-10-03 NOTE — H&P (Signed)
Behavioral Health Medical Screening Exam  [name] is an 58 y.o. female patient presents as a walk in at [facility] with complaints of suicidal ideation to walk into traffic.   States that her main stressor is that her daughter also has mental health issues and has been off of her meds "She is a violent drunk and I ain't had no sleep in 3 days.  I been off of my medicine to and I was hearing voices earlier today and having suicidal thoughts."  Patient states at this time she doesn't feel safe to go home right now related to her daughters mental health.  I need to get back on my medicine and get some sleep so that I can feel better; then I can get my daughter some help even if I have to have her committed.  At this time patient is unable to contract for safety.   Total Time spent with patient: 30 minutes  Psychiatric Specialty Exam: Physical Exam  Constitutional: She is oriented to person, place, and time. She appears well-developed and well-nourished. No distress.  Neck: Normal range of motion.  Respiratory: Effort normal.  Musculoskeletal: Normal range of motion.  Neurological: She is alert and oriented to person, place, and time.  Skin: Skin is warm and dry.  Psychiatric: Her speech is normal and behavior is normal. Her mood appears anxious. Cognition and memory are normal. She expresses impulsivity. She exhibits a depressed mood. Suicidal: Reporting passive suicidal thouthts.    Review of Systems  Psychiatric/Behavioral: Positive for depression (Reports worsening depression). Hallucinations: States she was hearing voices telling her they are "coming to get me" earlier today but not now. Substance abuse: Cocaine; last use 3 days ago. Suicidal ideas: States that she has had suicidal thoughts but none currently but has thogut about walking int to traffic. The patient is nervous/anxious and has insomnia (States she has not slept in 3 days).   All other systems reviewed and are negative.    There were no vitals taken for this visit.There is no height or weight on file to calculate BMI.  General Appearance: Casual, Disheveled and soild clothing and body  Eye Contact:  Good  Speech:  Clear and Coherent and Normal Rate  Volume:  Normal  Mood:  Anxious and Depressed  Affect:  Depressed  Thought Process:  Coherent and Goal Directed  Orientation:  Full (Time, Place, and Person)  Thought Content:  Hallucinations: Auditory and Paranoid Ideation  Suicidal Thoughts:  Yes.  without intent/plan  Homicidal Thoughts:  No  Memory:  Immediate;   Good Recent;   Good  Judgement:  Fair  Insight:  Fair  Psychomotor Activity:  Normal  Concentration: Concentration: Good and Attention Span: Good  Recall:  Good  Fund of Knowledge:Fair  Language: Good  Akathisia:  No  Handed:  Right  AIMS (if indicated):     Assets:  Communication Skills Desire for Improvement Housing Social Support  Sleep:       Musculoskeletal: Strength & Muscle Tone: within normal limits Gait & Station: normal Patient leans: N/A  There were no vitals taken for this visit.  Recommendations:  Observation overnight reassess tomorrow.  Restart home medications and referrals for outpatient psychiatric services  Based on my evaluation the patient does not appear to have an emergency medical condition.  Mazi Brailsford, NP 10/03/2019, 3:31 PM

## 2019-10-04 DIAGNOSIS — F250 Schizoaffective disorder, bipolar type: Secondary | ICD-10-CM | POA: Diagnosis not present

## 2019-10-04 DIAGNOSIS — G4700 Insomnia, unspecified: Secondary | ICD-10-CM | POA: Diagnosis not present

## 2019-10-04 DIAGNOSIS — Z20828 Contact with and (suspected) exposure to other viral communicable diseases: Secondary | ICD-10-CM | POA: Diagnosis not present

## 2019-10-04 DIAGNOSIS — F1420 Cocaine dependence, uncomplicated: Secondary | ICD-10-CM | POA: Diagnosis not present

## 2019-10-04 NOTE — Progress Notes (Signed)
D: Pt A & O X 4. Presents anxious / fidgety with clear and logical speech on interactions. Pt denies SI, HI, AVH and pain at this time. D/C home as ordered. Instructions given to the bus stop.   A: D/C instructions reviewed with pt including upcoming physical appointment; compliance encouraged. All belongings from assigned locker returned to pt at time of departure. Scheduled and PRN medications given with verbal education and effects monitored. Safety checks maintained without incident till time of d/c.  R: Pt receptive to care. Compliant with medications when offered. Denies adverse drug reactions when assessed. Verbalized understanding related to d/c instructions. Signed belonging sheet in agreement with items received from locker. Ambulatory with a steady gait. Appears to be in no physical distress at time of departure.

## 2019-10-04 NOTE — Discharge Summary (Signed)
Physician Discharge Summary Note  Patient:  [name] is an 58 y.o., female MRN:  [id number] DOB:  July 17, 1961 Patient phone:  [phone] (home)  Patient address:   [address] [place] [id number],  Total Time spent with patient: 20 minutes  Date of Admission:  10/03/2019 Date of Discharge: 10/04/2019  Reason for Admission:  Patient initially reported suicidal ideations with a plan to cut wrists. Patient reported using cocaine then arguing verbally with her daughter, of the home. Patient on assessment today denies suicidal thoughts, states "my daughter is crazy." Patient denies homicidal ideations, denies auditory and visual hallucinations. [facility], patient reports taking medications as prescribed.   Principal Problem: Schizoaffective disorder, bipolar type [facility](HCC) Discharge Diagnoses: Principal Problem:   Schizoaffective disorder, bipolar type (HCC) Active Problems:   Cocaine use disorder, severe, dependence (HCC)   Past Psychiatric History: Cocaine Use Disorder, Schizoaffective Disorder, Cannabis Use Disorder  Past Medical History:  Past Medical History:  Diagnosis Date  . Anxiety   . Bipolar 1 disorder (HCC)   . Depression   . Drug abuse (HCC)   . Headache   . Schizophrenia [facility](HCC)     Past Surgical History:  Procedure Laterality Date  . NO PAST SURGERIES     Family History:  Family History  Problem Relation Age of Onset  . Mental illness Maternal Aunt   . Cancer Maternal Aunt        breast   . Mental illness Paternal Aunt   . Anesthesia problems Neg Hx   . Hypotension Neg Hx   . Malignant hyperthermia Neg Hx   . Pseudochol deficiency Neg Hx    Family Psychiatric  History: Bipolar Disorder Social History:  Social History   Substance and Sexual Activity  Alcohol Use Yes  . Alcohol/week: 3.0 standard drinks  . Types: 3 Cans of beer per week   Comment: 8-10 can daily     Social History   Substance and  Sexual Activity  Drug Use Yes  . Types: Marijuana, Cocaine   Comment: occasionally    Social History   Socioeconomic History  . Marital status: Single    Spouse name: Not on file  . Number of children: Not on file  . Years of education: Not on file  . Highest education level: Not on file  Occupational History  . Not on file  Social Needs  . Financial resource strain: Not on file  . Food insecurity    Worry: Not on file    Inability: Not on file  . Transportation needs    Medical: Not on file    Non-medical: Not on file  Tobacco Use  . Smoking status: Current Every Day Smoker    Packs/day: 0.25    Years: 10.00    Pack years: 2.50    Types: Cigarettes, Cigars  . Smokeless tobacco: Never Used  Substance and Sexual Activity  . Alcohol use: Yes    Alcohol/week: 3.0 standard drinks    Types: 3 Cans of beer per week    Comment: 8-10 can daily  . Drug use: Yes    Types: Marijuana, Cocaine    Comment: occasionally  . Sexual activity: Not Currently  Lifestyle  . Physical activity    Days per week: Not on file    Minutes per session: Not on file  . Stress: Not on file  Relationships  . Social connections    Talks on phone: Not on file  Gets together: Not on file    Attends religious service: Not on file    Active member of club or organization: Not on file    Attends meetings of clubs or organizations: Not on file    Relationship status: Not on file  Other Topics Concern  . Not on file  Social History Narrative  . Not on file    Hospital Course:  Patient is well-known to this writer, patient presents with suicidal ideations on 10/03/2019. Today patient denies suicidal ideations, homicidal ideations and AVH. Patient plans to follow-up with outpatient.  Physical Findings: AIMS: Facial and Oral Movements Muscles of Facial Expression: None, normal Lips and Perioral Area: None, normal Jaw: None, normal Tongue: None, normal,Extremity Movements Upper (arms, wrists,  hands, fingers): None, normal Lower (legs, knees, ankles, toes): None, normal, Trunk Movements Neck, shoulders, hips: None, normal, Overall Severity Severity of abnormal movements (highest score from questions above): None, normal Incapacitation due to abnormal movements: None, normal Patient's awareness of abnormal movements (rate only patient's report): No Awareness, Dental Status Current problems with teeth and/or dentures?: No Does patient usually wear dentures?: No  CIWA:  CIWA-Ar Total: 2 COWS:  COWS Total Score: 1  Musculoskeletal: Strength & Muscle Tone: within normal limits Gait & Station: normal Patient leans: N/A  Psychiatric Specialty Exam: Physical Exam  Nursing note and vitals reviewed. Constitutional: She is oriented to person, place, and time. She appears well-developed.  HENT:  Head: Normocephalic.  Cardiovascular: Normal rate.  Respiratory: Effort normal.  Neurological: She is alert and oriented to person, place, and time.  Psychiatric: She has a normal mood and affect. Her behavior is normal. Thought content normal.    Review of Systems  Constitutional: Negative.   HENT: Negative.   Eyes: Negative.   Respiratory: Negative.   Cardiovascular: Negative.   Gastrointestinal: Negative.   Genitourinary: Negative.   Musculoskeletal: Negative.   Skin: Negative.   Neurological: Negative.   Endo/Heme/Allergies: Negative.   Psychiatric/Behavioral: Positive for substance abuse.    Blood pressure 137/87, pulse 87, temperature 98.6 F (37 C), temperature source Oral, resp. rate 16, SpO2 100 %.There is no height or weight on file to calculate BMI.  General Appearance: Casual  Eye Contact::  Good  Speech:  Clear and Coherent409  Volume:  Normal  Mood:  Euthymic  Affect:  Appropriate  Thought Process:  Coherent and Descriptions of Associations: Intact  Orientation:  Full (Time, Place, and Person)  Thought Content:  Logical  Suicidal Thoughts:  No  Homicidal  Thoughts:  No  Memory:  Immediate;   Good Recent;   Good Remote;   Good  Judgement:  Fair  Insight:  Fair  Psychomotor Activity:  Normal  Concentration:  Good  Recall:  Good  Fund of Knowledge:Good  Language: Good  Akathisia:  No  Handed:  Right  AIMS (if indicated):     Assets:  Architect Housing Social Support  Sleep:     Cognition: WNL           Has this patient used any form of tobacco in the last 30 days? (Cigarettes, Smokeless Tobacco, Cigars, and/or Pipes)  No  Blood Alcohol level:  Lab Results  Component Value Date   ETH <10 10/03/2019   ETH <10 11/05/2018    Metabolic Disorder Labs:  Lab Results  Component Value Date   HGBA1C 5.9 (H) 10/03/2019   MPG 122.63 10/03/2019   MPG 116.89 04/07/2018   Lab Results  Component Value Date  PROLACTIN 8.9 04/07/2018   PROLACTIN 14.3 09/23/2015   Lab Results  Component Value Date   CHOL 165 10/03/2019   TRIG 134 10/03/2019   HDL 90 10/03/2019   CHOLHDL 1.8 10/03/2019   VLDL 27 10/03/2019   LDLCALC NOT CALCULATED 10/03/2019   LDLCALC 86 04/07/2018    See Psychiatric Specialty Exam and Suicide Risk Assessment completed by Attending Physician prior to discharge.  Discharge destination:  Home  Is patient on multiple antipsychotic therapies at discharge:  No   Has Patient had three or more failed trials of antipsychotic monotherapy by history:  No  Recommended Plan for Multiple Antipsychotic Therapies: NA   Allergies as of 10/04/2019   No Known Allergies     Medication List    TAKE these medications     Indication  QUEtiapine 200 MG tablet Commonly known as: SEROQUEL Take 1 tablet (200 mg total) by mouth at bedtime. For mood control  Indication: Mood control        Follow-up recommendations: Take all medications as prescribed. Please attend all follow-up appointments as scheduled. Report any side effects to your outpatient psychiatrist. Abstain from  alcohol and illegal drugs while taking prescription medications. In the event of worsening symptoms call the crisis hotline, 911 or go to the nearest emergency department for evaluation and treatment.    Comments:  Discharge home  Signed: Emmaline Kluver, [place] 10/04/2019, 12:27 PM

## 2019-10-04 NOTE — H&P (Signed)
BH Observation Unit Provider Admission PAA/H&P  Patient Identification: [name] MRN:  [id number] Date of Evaluation:  10/04/2019 Chief Complaint:  mdd Principal Diagnosis: Schizoaffective disorder, bipolar type (HCC) Diagnosis:  Principal Problem:   Schizoaffective disorder, bipolar type (HCC) Active Problems:   Cocaine use disorder, severe, dependence (HCC)  History of Present Illness:   TTS Assessment:  [name] is an 58 y.o. female presents to [facility] voluntarily. Pt reports SI with thoughts of cutting her wrists or overdosing. Pt reports hearing voices. Pt reports hx of Schizophrenia diagnosis and cocaine use. Pt reports being off her medication and arguing with her daughter that she lives with who is bipolar and not on medication. Pt has services at [facility] and multiple inpatient services at [facility].  Pt does not works and receives disability. Pt denies abuse. Pt denies homicidal thoughts or physical aggression. Pt denies having access to firearms. Pt denies having any legal problems at this time.  Pt is dressed in street clothes, alert, oriented x4 with rapid speech and restless motor behavior. Eye contact is good and Pt is restless. Pt's mood is depressed and affect is anxious. Thought process is coherent and relevant. Pt's insight is poor and judgement is fair. Pt was cooperative throughout assessment. She says she is willing to sign voluntarily into a psychiatric facility.  Medical Screening Exam: Patient reports suicidal ideation with plans to walk into traffic.   States that her main stressor is that her daughter also has mental health issues and has been off of her meds "She is a violent drunk and I ain't had no sleep in 3 days.  I been off of my medicine to and I was hearing voices earlier today and having suicidal thoughts."  Patient states at this time she doesn't feel safe to go home right now related to her daughters mental health.  I need to get back on my medicine and get  some sleep so that I can feel better; then I can get my daughter some help even if I have to have her committed.  At this time patient is unable to contract for safety.    Evaluation on Unit: Reviewed TTS assessment and validated with patient. On evaluation patient is alert and oriented x 4, pleasant, and cooperative. Speech is clear and coherent. Mood is depressed and affect is congruent with mood. Thought process is coherent and linear. Reports auditory hallucinations. Continues to endorse suicidal ideations. She is able to contract for safety on the unit. Denies homicidal ideations. Reports cocaine abuse. No indication that patient is responding to internal stimuli.   Reports rhinorrhea that started yesterday. Denies cough, shortness or breath, sore throat, fever, chills, nausea, vomiting, and diarrhea. Reports history of seasonal allergies. COVID negative.    Associated Signs/Symptoms: Depression Symptoms:  depressed mood, insomnia, psychomotor agitation, suicidal thoughts with specific plan, (Hypo) Manic Symptoms:  Hallucinations, Impulsivity, Irritable Mood, Anxiety Symptoms:  Excessive Worry, Psychotic Symptoms:  Hallucinations: Auditory PTSD Symptoms: Negative Total Time spent with patient: 20 minutes  Past Psychiatric History: Schizoaffective Disorder, Cocaine Use Disorder. Last inpatient admission 10/2018 at [facility].  Is the patient at risk to self? Yes.    Has the patient been a risk to self in the past 6 months? No.  Has the patient been a risk to self within the distant past? Yes.    Is the patient a risk to others? No.  Has the patient been a risk to others in the past 6 months? No.  Has the patient  been a risk to others within the distant past? No.   Prior Inpatient Therapy: Prior Inpatient Therapy: Yes Prior Therapy Dates: 2020 [facility] Reason for Treatment: Schitzophrenia Prior Outpatient Therapy: Prior Outpatient Therapy: Yes Prior  Therapy Dates: Ongoing Prior Therapy Facilty/Provider(s): [facility] Reason for Treatment: Schitzophrenia Does patient have an ACCT team?: Unknown Does patient have Intensive In-House Services?  : Unknown Does patient have [facility] services? : Yes Does patient have P4CC services?: Unknown  Alcohol Screening:   Substance Abuse History in the last 12 months:  Yes.   Consequences of Substance Abuse: Negative Previous Psychotropic Medications: Yes  Psychological Evaluations: No  Past Medical History:  Past Medical History:  Diagnosis Date  . Anxiety   . Bipolar 1 disorder (HCC)   . Depression   . Drug abuse (HCC)   . Headache   . Schizophrenia [facility](HCC)     Past Surgical History:  Procedure Laterality Date  . NO PAST SURGERIES     Family History:  Family History  Problem Relation Age of Onset  . Mental illness Maternal Aunt   . Cancer Maternal Aunt        breast   . Mental illness Paternal Aunt   . Anesthesia problems Neg Hx   . Hypotension Neg Hx   . Malignant hyperthermia Neg Hx   . Pseudochol deficiency Neg Hx    Family Psychiatric History: Bipolar Disorder Tobacco Screening:   Social History:  Social History   Substance and Sexual Activity  Alcohol Use Yes  . Alcohol/week: 3.0 standard drinks  . Types: 3 Cans of beer per week   Comment: 8-10 can daily     Social History   Substance and Sexual Activity  Drug Use Yes  . Types: Marijuana, Cocaine   Comment: occasionally    Additional Social History:      Pain Medications: See MAR Prescriptions: See MAR Over the Counter: See MAR History of alcohol / drug use?: Yes Name of Substance 1: cocaine 1 - Age of First Use: 32 1 - Amount (size/oz): ukn 1 - Frequency: ongoing 1 - Duration: ukn 1 - Last Use / Amount: week ago                  Allergies:  No Known Allergies Lab Results:  Results for orders placed or performed during the hospital encounter of 10/03/19 (from the past 48 hour(s))  SARS  Coronavirus 2 by RT PCR (hospital order, [facility]) Nasopharyngeal Nasopharyngeal Swab     Status: None   Collection Time: 10/03/19  6:13 PM   Specimen: Nasopharyngeal Swab  Result Value Ref Range   SARS Coronavirus 2 NEGATIVE NEGATIVE    Comment: (NOTE) If result is NEGATIVE SARS-CoV-2 target nucleic acids are NOT DETECTED. The SARS-CoV-2 RNA is generally detectable in upper and lower  respiratory specimens during the acute phase of infection. The lowest  concentration of SARS-CoV-2 viral copies this assay can detect is 250  copies / mL. A negative result does not preclude SARS-CoV-2 infection  and should not be used as the sole basis for treatment or other  patient management decisions.  A negative result may occur with  improper specimen collection / handling, submission of specimen other  than nasopharyngeal swab, presence of viral mutation(s) within the  areas targeted by this assay, and inadequate number of viral copies  (<250 copies / mL). A negative result must be combined with clinical  observations, patient history, and epidemiological  information. If result is POSITIVE SARS-CoV-2 target nucleic acids are DETECTED. The SARS-CoV-2 RNA is generally detectable in upper and lower  respiratory specimens dur ing the acute phase of infection.  Positive  results are indicative of active infection with SARS-CoV-2.  Clinical  correlation with patient history and other diagnostic information is  necessary to determine patient infection status.  Positive results do  not rule out bacterial infection or co-infection with other viruses. If result is PRESUMPTIVE POSTIVE SARS-CoV-2 nucleic acids MAY BE PRESENT.   A presumptive positive result was obtained on the submitted specimen  and confirmed on repeat testing.  While 2019 novel coronavirus  (SARS-CoV-2) nucleic acids may be present in the submitted sample  additional confirmatory testing may be necessary for  epidemiological  and / or clinical management purposes  to differentiate between  SARS-CoV-2 and other Sarbecovirus currently known to infect humans.  If clinically indicated additional testing with an alternate test  methodology [phone]) is advised. The SARS-CoV-2 RNA is generally  detectable in upper and lower respiratory sp ecimens during the acute  phase of infection. The expected result is Negative. Fact Sheet for Patients:  StrictlyIdeas.no Fact Sheet for Healthcare Providers: BankingDealers.co.za This test is not yet approved or cleared by the [place] FDA and has been authorized for detection and/or diagnosis of SARS-CoV-2 by FDA under an Emergency Use Authorization (EUA).  This EUA will remain in effect (meaning this test can be used) for the duration of the COVID-19 declaration under Section 564(b)(1) of the Act, 21 U.S.C. section 360bbb-3(b)(1), unless the authorization is terminated or revoked sooner. Performed at [facility], [place] [address], [place], [place] [address]   CBC     Status: None   Collection Time: 10/03/19  6:45 PM  Result Value Ref Range   WBC 5.5 4.0 - 10.5 K/uL   RBC 4.81 3.87 - 5.11 MIL/uL   Hemoglobin 14.1 12.0 - 15.0 g/dL   HCT 44.8 36.0 - 46.0 %   MCV 93.1 80.0 - 100.0 fL   MCH 29.3 26.0 - 34.0 pg   MCHC 31.5 30.0 - 36.0 g/dL   RDW 14.8 11.5 - 15.5 %   Platelets 233 150 - 400 K/uL   nRBC 0.0 0.0 - 0.2 %    Comment: Performed at [facility], [place] [address] [place], [place] [address]  TSH     Status: None   Collection Time: 10/03/19  6:45 PM  Result Value Ref Range   TSH 1.132 0.350 - 4.500 uIU/mL    Comment: Performed by a 3rd Generation assay with a functional sensitivity of <=0.01 uIU/mL. Performed at [facility], [place] [address], [place], [place] [address]   Hemoglobin A1c     Status: Abnormal   Collection Time: 10/03/19  6:45 PM   Result Value Ref Range   Hgb A1c MFr Bld 5.9 (H) 4.8 - 5.6 %    Comment: (NOTE) Pre diabetes:          5.7%-6.4% Diabetes:              >6.4% Glycemic control for   <7.0% adults with diabetes    Mean Plasma Glucose 122.63 mg/dL    Comment: Performed at [place] [address], [place], [place] [address]  Lipid panel     Status: None   Collection Time: 10/03/19  6:45 PM  Result Value Ref Range   Cholesterol 165 0 - 200 mg/dL   Triglycerides  134 <150 mg/dL   HDL 90 >93 mg/dL   Total CHOL/HDL Ratio 1.8 RATIO   VLDL 27 0 - 40 mg/dL   LDL Cholesterol NOT CALCULATED 0 - 99 mg/dL    Comment: Performed at [facility], [address], [place], [place] [address]  Comprehensive metabolic panel     Status: Abnormal   Collection Time: 10/03/19  6:45 PM  Result Value Ref Range   Sodium 140 135 - 145 mmol/L   Potassium 4.0 3.5 - 5.1 mmol/L   Chloride 105 98 - 111 mmol/L   CO2 26 22 - 32 mmol/L   Glucose, Bld 115 (H) 70 - 99 mg/dL   BUN 17 6 - 20 mg/dL   Creatinine, Ser 7.89 0.44 - 1.00 mg/dL   Calcium 9.4 8.9 - 38.1 mg/dL   Total Protein 6.7 6.5 - 8.1 g/dL   Albumin 3.8 3.5 - 5.0 g/dL   AST 29 15 - 41 U/L   ALT 27 0 - 44 U/L   Alkaline Phosphatase 65 38 - 126 U/L   Total Bilirubin 0.3 0.3 - 1.2 mg/dL   GFR calc non Af Amer >60 >60 mL/min   GFR calc Af Amer >60 >60 mL/min   Anion gap 9 5 - 15    Comment: Performed at [facility], [address] [place], [place] [address]  Ethanol     Status: None   Collection Time: 10/03/19  6:45 PM  Result Value Ref Range   Alcohol, Ethyl (B) <10 <10 mg/dL    Comment: (NOTE) Lowest detectable limit for serum alcohol is 10 mg/dL. For medical purposes only. Performed at [facility], [address] [place], [place] [address]     Blood Alcohol level:  Lab Results  Component Value Date   [facility] <10 10/03/2019   ETH <10 11/05/2018    Metabolic Disorder Labs:  Lab Results   Component Value Date   HGBA1C 5.9 (H) 10/03/2019   MPG 122.63 10/03/2019   MPG 116.89 04/07/2018   Lab Results  Component Value Date   PROLACTIN 8.9 04/07/2018   PROLACTIN 14.3 09/23/2015   Lab Results  Component Value Date   CHOL 165 10/03/2019   TRIG 134 10/03/2019   HDL 90 10/03/2019   CHOLHDL 1.8 10/03/2019   VLDL 27 10/03/2019   LDLCALC NOT CALCULATED 10/03/2019   LDLCALC 86 04/07/2018    Current Medications: Current Facility-Administered Medications  Medication Dose Route Frequency Provider Last Rate Last Dose  . acetaminophen (TYLENOL) tablet 650 mg  650 mg Oral Q6H PRN Jackelyn Poling, NP      . alum & mag hydroxide-simeth (MAALOX/MYLANTA) 200-200-20 MG/5ML suspension 30 mL  30 mL Oral Q4H PRN Jackelyn Poling, NP      . guaiFENesin (MUCINEX) 12 hr tablet 600 mg  600 mg Oral BID PRN Nira Conn A, NP      . hydrOXYzine (ATARAX/VISTARIL) tablet 25 mg  25 mg Oral TID PRN Jackelyn Poling, NP      . loratadine (CLARITIN) tablet 10 mg  10 mg Oral Daily Nira Conn A, NP      . magnesium hydroxide (MILK OF MAGNESIA) suspension 30 mL  30 mL Oral Daily PRN Nira Conn A, NP      . multivitamin with minerals tablet 1 tablet  1 tablet Oral Daily Rankin, Shuvon B, NP      . QUEtiapine (SEROQUEL) tablet 200 mg  200 mg Oral QHS Nira Conn  A, NP   200 mg at 10/03/19 2232   PTA Medications: Medications Prior to Admission  Medication Sig Dispense Refill Last Dose  . cloNIDine (CATAPRES) 0.1 MG tablet Take 1 tablet (0.1 mg total) by mouth 2 (two) times daily. For high blood pressure/anxiety 60 tablet 0   . ENSURE PLUS (ENSURE PLUS) LIQD Take 237 mLs by mouth 2 (two) times daily between meals. (May buy from over the counter): Nutritional supplement.  0   . erythromycin ophthalmic ointment Place a 1/2 inch ribbon of ointment into the lower eyelid 4 times daily for the next 7 days 3.5 g 0   . gabapentin (NEURONTIN) 300 MG capsule Take 1 capsule (300 mg total) by mouth 3 (three) times  daily. For agitation 90 capsule 0   . hydrOXYzine (ATARAX/VISTARIL) 25 MG tablet Take 1 tablet (25 mg total) by mouth 3 (three) times daily as needed for anxiety. 90 tablet 0   . ibuprofen (ADVIL,MOTRIN) 800 MG tablet Take 1 tablet (800 mg total) by mouth every 6 (six) hours as needed for moderate pain. (May buy from over the counter): For pain 30 tablet 0   . Multiple Vitamin (MULTIVITAMIN WITH MINERALS) TABS tablet Take 1 tablet by mouth daily. Vitamin supplement     . nicotine polacrilex (NICORETTE) 2 MG gum Take 1 each (2 mg total) by mouth as needed for smoking cessation. (May buy from over the counter): For smoking cessation 100 tablet 0   . QUEtiapine (SEROQUEL) 200 MG tablet Take 1 tablet (200 mg total) by mouth at bedtime. For mood control 30 tablet 0   . QUEtiapine (SEROQUEL) 25 MG tablet Take 1 tablet (25 mg total) by mouth daily at 6 (six) AM. For agitation 30 tablet 0     Musculoskeletal: Strength & Muscle Tone: within normal limits Gait & Station: normal Patient leans: N/A  Psychiatric Specialty Exam: Physical Exam  Constitutional: She appears well-developed and well-nourished. No distress.  HENT:  Head: Normocephalic and atraumatic.  Right Ear: External ear normal.  Left Ear: External ear normal.  Nose: Rhinorrhea present.  Mouth/Throat: Oropharynx is clear and moist. No oropharyngeal exudate.  Eyes: Pupils are equal, round, and reactive to light. Right eye exhibits no discharge. Left eye exhibits no discharge.  Skin: She is not diaphoretic.    Review of Systems  Psychiatric/Behavioral: Positive for depression and suicidal ideas.    Blood pressure 131/79, temperature 98.4 F (36.9 C), temperature source Oral, SpO2 100 %.There is no height or weight on file to calculate BMI.  General Appearance: Casual  Eye Contact:  Fair  Speech:  Clear and Coherent and Normal Rate  Volume:  Decreased  Mood:  Anxious, Depressed and Hopeless  Affect:  Congruent and Depressed   Thought Process:  Coherent, Linear and Descriptions of Associations: Intact  Orientation:  Full (Time, Place, and Person)  Thought Content:  Hallucinations: Auditory  Suicidal Thoughts:  Yes.  with intent/plan  Homicidal Thoughts:  No  Memory:  Immediate;   Good Recent;   Good  Judgement:  Intact  Insight:  Lacking  Psychomotor Activity:  Normal  Concentration:  Concentration: Fair and Attention Span: Fair  Recall:  Good  Fund of Knowledge:  Good  Language:  Good  Akathisia:  Negative  Handed:  Right  AIMS (if indicated):     Assets:  Communication Skills Desire for Improvement Leisure Time Physical Health  ADL's:  Intact  Cognition:  WNL  Sleep:  Treatment Plan Summary: Daily contact with patient to assess and evaluate symptoms and progress in treatment and Medication management  Observation Level/Precautions:  15 minute checks Laboratory:  CBC Chemistry Profile HbAIC HCG UDS Psychotherapy:  Individual Medications:    Resume Seroquel 200 mg QHS for schizoaffective disorder Start Claritin 10 mg daily for seasonal allergies.  Consultations:  As needed Discharge Concerns:  Safety Estimated LOS: Other:      Jackelyn Poling, NP 10/10/20203:52 AM

## 2019-10-04 NOTE — [facility] Suicide Risk Assessment (Signed)
Suicide Risk Assessment  Discharge Assessment   [facility] Discharge Suicide Risk Assessment   Principal Problem: Schizoaffective disorder, bipolar type [facility]) Discharge Diagnoses: Principal Problem:   Schizoaffective disorder, bipolar type ([place]) Active Problems:   Cocaine use disorder, severe, dependence ([place])   Total Time spent with patient: 20 minutes  Musculoskeletal: Strength & Muscle Tone: within normal limits Gait & Station: normal Patient leans: N/A  Psychiatric Specialty Exam:   Blood pressure 137/87, pulse 87, temperature 98.6 F (37 C), temperature source Oral, resp. rate 16, SpO2 100 %.There is no height or weight on file to calculate BMI.  General Appearance: Casual  Eye Contact::  Good  Speech:  Clear and Coherent409  Volume:  Normal  Mood:  Euthymic  Affect:  Appropriate  Thought Process:  Coherent and Descriptions of Associations: Intact  Orientation:  Full (Time, Place, and Person)  Thought Content:  Logical  Suicidal Thoughts:  No  Homicidal Thoughts:  No  Memory:  Immediate;   Good Recent;   Good Remote;   Good  Judgement:  Fair  Insight:  Fair  Psychomotor Activity:  Normal  Concentration:  Good  Recall:  Good  Fund of Knowledge:Good  Language: Good  Akathisia:  No  Handed:  Right  AIMS (if indicated):     Assets:  Agricultural consultant Housing Social Support  Sleep:     Cognition: WNL  ADL's:  Intact   Mental Status Per Nursing Assessment::   On Admission:  Suicidal ideation indicated by patient, Self-harm thoughts  Demographic Factors:  NA  Loss Factors: NA  Historical Factors: Prior suicide attempts  Risk Reduction Factors:   Sense of responsibility to family, Living with another person, especially a relative, Positive social support, Positive therapeutic relationship and Positive coping skills or problem solving skills  Continued Clinical Symptoms:  Alcohol/Substance Abuse/Dependencies  Cognitive  Features That Contribute To Risk:  None    Suicide Risk:  Minimal: No identifiable suicidal ideation.  Patients presenting with no risk factors but with morbid ruminations; may be classified as minimal risk based on the severity of the depressive symptoms    Plan Of Care/Follow-up recommendations:  Take all medications as prescribed. Please attend all follow-up appointments as scheduled. Report any side effects to your outpatient psychiatrist. Abstain from alcohol and illegal drugs while taking prescription medications. In the event of worsening symptoms call the crisis hotline, 911 or go to the nearest emergency department for evaluation and treatment.    Emmaline Kluver, FNP 10/04/2019, 12:13 PM

## 2019-10-04 NOTE — Progress Notes (Signed)
[place] NOVEL CORONAVIRUS (COVID-19) DAILY CHECK-OFF SYMPTOMS - answer yes or no to each - every day NO YES  Have you had a fever in the past 24 hours?  . Fever (Temp > 37.80C / 100F) X   Have you had any of these symptoms in the past 24 hours? . New Cough .  Sore Throat  .  Shortness of Breath .  Difficulty Breathing .  Unexplained Body Aches   X   Have you had any one of these symptoms in the past 24 hours not related to allergies?   . Runny Nose .  Nasal Congestion .  Sneezing   X   If you have had runny nose, nasal congestion, sneezing in the past 24 hours, has it worsened?  X   EXPOSURES - check yes or no X   Have you traveled outside the state in the past 14 days?  X   Have you been in contact with someone with a confirmed diagnosis of COVID-19 or PUI in the past 14 days without wearing appropriate PPE?  X   Have you been living in the same home as a person with confirmed diagnosis of COVID-19 or a PUI (household contact)?    X   Have you been diagnosed with COVID-19?    X              What to do next: Answered NO to all: Answered YES to anything:   Proceed with unit schedule Follow the [facility] Inpatient Flowsheet.   

## 2019-11-04 ENCOUNTER — Encounter: Payer: Self-pay | Admitting: Family Medicine

## 2020-02-20 ENCOUNTER — Telehealth: Payer: Self-pay | Admitting: Nurse Practitioner

## 2020-02-20 NOTE — Telephone Encounter (Signed)
Pt was called and reminded of there appointment 

## 2020-02-23 ENCOUNTER — Ambulatory Visit: Payer: Self-pay | Admitting: Nurse Practitioner

## 2020-04-29 ENCOUNTER — Ambulatory Visit (HOSPITAL_COMMUNITY)
Admission: RE | Admit: 2020-04-29 | Discharge: 2020-04-29 | Disposition: A | Discharge status: Home / Self Care | Payer: Medicare Other | Attending: Psychiatry | Admitting: Psychiatry

## 2020-04-29 DIAGNOSIS — R45851 Suicidal ideations: Secondary | ICD-10-CM | POA: Insufficient documentation

## 2020-04-29 DIAGNOSIS — F419 Anxiety disorder, unspecified: Secondary | ICD-10-CM | POA: Diagnosis not present

## 2020-04-29 DIAGNOSIS — F319 Bipolar disorder, unspecified: Secondary | ICD-10-CM | POA: Insufficient documentation

## 2020-04-29 DIAGNOSIS — Z915 Personal history of self-harm: Secondary | ICD-10-CM | POA: Diagnosis not present

## 2020-04-29 DIAGNOSIS — F1420 Cocaine dependence, uncomplicated: Secondary | ICD-10-CM | POA: Diagnosis not present

## 2020-04-29 DIAGNOSIS — Z79899 Other long term (current) drug therapy: Secondary | ICD-10-CM | POA: Insufficient documentation

## 2020-04-29 DIAGNOSIS — F209 Schizophrenia, unspecified: Secondary | ICD-10-CM | POA: Insufficient documentation

## 2020-04-30 ENCOUNTER — Observation Stay (HOSPITAL_COMMUNITY)
Admission: RE | Admit: 2020-04-30 | Discharge: 2020-04-30 | Disposition: A | Discharge status: Home / Self Care | Payer: Medicare Other | Attending: Psychiatry | Admitting: Psychiatry

## 2020-04-30 ENCOUNTER — Other Ambulatory Visit: Payer: Self-pay

## 2020-04-30 ENCOUNTER — Encounter (HOSPITAL_COMMUNITY): Payer: Self-pay | Admitting: Psychiatry

## 2020-04-30 DIAGNOSIS — F319 Bipolar disorder, unspecified: Secondary | ICD-10-CM | POA: Insufficient documentation

## 2020-04-30 DIAGNOSIS — F17290 Nicotine dependence, other tobacco product, uncomplicated: Secondary | ICD-10-CM | POA: Diagnosis not present

## 2020-04-30 DIAGNOSIS — F259 Schizoaffective disorder, unspecified: Secondary | ICD-10-CM | POA: Insufficient documentation

## 2020-04-30 DIAGNOSIS — F419 Anxiety disorder, unspecified: Secondary | ICD-10-CM | POA: Insufficient documentation

## 2020-04-30 DIAGNOSIS — F1420 Cocaine dependence, uncomplicated: Principal | ICD-10-CM | POA: Insufficient documentation

## 2020-04-30 DIAGNOSIS — Z20822 Contact with and (suspected) exposure to covid-19: Secondary | ICD-10-CM | POA: Insufficient documentation

## 2020-04-30 DIAGNOSIS — R45851 Suicidal ideations: Secondary | ICD-10-CM | POA: Insufficient documentation

## 2020-04-30 DIAGNOSIS — F17210 Nicotine dependence, cigarettes, uncomplicated: Secondary | ICD-10-CM | POA: Insufficient documentation

## 2020-04-30 DIAGNOSIS — Z79899 Other long term (current) drug therapy: Secondary | ICD-10-CM | POA: Diagnosis not present

## 2020-04-30 LAB — RESPIRATORY PANEL BY RT PCR (FLU A&B, COVID)
Influenza A by PCR: NEGATIVE
Influenza B by PCR: NEGATIVE
SARS Coronavirus 2 by RT PCR: NEGATIVE

## 2020-04-30 LAB — HEPATIC FUNCTION PANEL
ALT: 21 U/L (ref 0–44)
AST: 23 U/L (ref 15–41)
Albumin: 3.7 g/dL (ref 3.5–5.0)
Alkaline Phosphatase: 57 U/L (ref 38–126)
Bilirubin, Direct: <0.1 mg/dL (ref 0.0–0.2)
Total Bilirubin: 0.4 mg/dL (ref 0.3–1.2)
Total Protein: 6.3 g/dL — ABNORMAL LOW (ref 6.5–8.1)

## 2020-04-30 LAB — COMPREHENSIVE METABOLIC PANEL
ALT: 23 U/L (ref 0–44)
AST: 23 U/L (ref 15–41)
Albumin: 3.5 g/dL (ref 3.5–5.0)
Alkaline Phosphatase: 58 U/L (ref 38–126)
Anion gap: 8 (ref 5–15)
BUN: 17 mg/dL (ref 6–20)
CO2: 26 mmol/L (ref 22–32)
Calcium: 8.5 mg/dL — ABNORMAL LOW (ref 8.9–10.3)
Chloride: 108 mmol/L (ref 98–111)
Creatinine, Ser: 0.9 mg/dL (ref 0.44–1.00)
GFR calc Af Amer: >60 mL/min (ref >60)
GFR calc non Af Amer: >60 mL/min (ref >60)
Glucose, Bld: 99 mg/dL (ref 70–99)
Potassium: 3.7 mmol/L (ref 3.5–5.1)
Sodium: 142 mmol/L (ref 135–145)
Total Bilirubin: 0.4 mg/dL (ref 0.3–1.2)
Total Protein: 6.2 g/dL — ABNORMAL LOW (ref 6.5–8.1)

## 2020-04-30 LAB — MAGNESIUM: Magnesium: 2.3 mg/dL (ref 1.7–2.4)

## 2020-04-30 LAB — LIPID PANEL
Cholesterol: 164 mg/dL (ref 0–200)
HDL: 87 mg/dL (ref >40)
LDL Cholesterol: 70 mg/dL (ref 0–99)
Total CHOL/HDL Ratio: 1.9 RATIO
Triglycerides: 35 mg/dL (ref <150)
VLDL: 7 mg/dL (ref 0–40)

## 2020-04-30 LAB — HEMOGLOBIN A1C
Hgb A1c MFr Bld: 5.8 % — ABNORMAL HIGH (ref 4.8–5.6)
Mean Plasma Glucose: 119.76 mg/dL

## 2020-04-30 LAB — TSH: TSH: 1.072 u[IU]/mL (ref 0.350–4.500)

## 2020-04-30 LAB — ETHANOL: Alcohol, Ethyl (B): <10 mg/dL (ref <10)

## 2020-04-30 MED ORDER — QUETIAPINE FUMARATE 200 MG PO TABS: 200.0000 mg | ORAL_TABLET | Freq: Every day | ORAL | 0 refills | Status: DC

## 2020-04-30 MED ORDER — MAGNESIUM HYDROXIDE 400 MG/5ML PO SUSP: 30.0000 mL | Freq: Every day | ORAL | Status: DC | PRN

## 2020-04-30 MED ORDER — ALUM & MAG HYDROXIDE-SIMETH 200-200-20 MG/5ML PO SUSP: 30.0000 mL | ORAL | Status: DC | PRN

## 2020-04-30 MED ORDER — ACETAMINOPHEN 325 MG PO TABS: 650.0000 mg | ORAL_TABLET | Freq: Four times a day (QID) | ORAL | Status: DC | PRN

## 2020-04-30 NOTE — Discharge Summary (Signed)
Physician Discharge Summary Note  Patient:  [name] is an 59 y.o., female MRN:  [id number] DOB:  [birth year] Patient phone:  [phone] (home)  Patient address:   [address] [place] [address],  Total Time spent with patient: 30 minutes  Date of Admission:  04/30/2020 Date of Discharge: 04/30/2020  Reason for Admission: Suicidal ideation  Principal Problem: Cocaine use disorder, severe, dependence (HCC) Discharge Diagnoses: Principal Problem:   Cocaine use disorder, severe, dependence (HCC) Active Problems:   Cocaine dependence, continuous use (HCC)   Past Psychiatric History: Patient has a history of cocaine use disorder/dependence as well as schizoaffective disorder.  Her last psychiatric admission to our facility was on 10/03/2019 to 10/04/2019.  Past Medical History:  Past Medical History:  Diagnosis Date  . Anxiety   . Bipolar 1 disorder (HCC)   . Depression   . Drug abuse (HCC)   . Headache   . Schizophrenia [facility])     Past Surgical History:  Procedure Laterality Date  . NO PAST SURGERIES     Family History:  Family History  Problem Relation Age of Onset  . Mental illness Maternal Aunt   . Cancer Maternal Aunt        breast   . Mental illness Paternal Aunt   . Anesthesia problems Neg Hx   . Hypotension Neg Hx   . Malignant hyperthermia Neg Hx   . Pseudochol deficiency Neg Hx    Family Psychiatric  History: Noncontributory Social History:  Social History   Substance and Sexual Activity  Alcohol Use Yes  . Alcohol/week: 3.0 standard drinks  . Types: 3 Cans of beer per week   Comment: 8-10 can daily     Social History   Substance and Sexual Activity  Drug Use Yes  . Types: Marijuana, Cocaine   Comment: occasionally    Social History   Socioeconomic History  . Marital status: Single    Spouse name: Not on file  . Number of children: Not on file  . Years of education: Not on file  . Highest education level: Not on file  Occupational  History  . Not on file  Tobacco Use  . Smoking status: Current Every Day Smoker    Packs/day: 0.25    Years: 10.00    Pack years: 2.50    Types: Cigarettes, Cigars  . Smokeless tobacco: Never Used  Substance and Sexual Activity  . Alcohol use: Yes    Alcohol/week: 3.0 standard drinks    Types: 3 Cans of beer per week    Comment: 8-10 can daily  . Drug use: Yes    Types: Marijuana, Cocaine    Comment: occasionally  . Sexual activity: Not Currently  Other Topics Concern  . Not on file  Social History Narrative  . Not on file   Social Determinants of Health   Financial Resource Strain:   . Difficulty of Paying Living Expenses:   Food Insecurity:   . Worried About Programme researcher, broadcasting/film/video in the Last Year:   . Barista in the Last Year:   Transportation Needs:   . Freight forwarder (Medical):   Marland Kitchen Lack of Transportation (Non-Medical):   Physical Activity:   . Days of Exercise per Week:   . Minutes of Exercise per Session:   Stress:   . Feeling of Stress :   Social Connections:   . Frequency of Communication with Friends and Family:   . Frequency of Social Gatherings with  Friends and Family:   . Attends Religious Services:   . Active Member of Clubs or Organizations:   . Attends Banker Meetings:   Marland Kitchen Marital Status:     Hospital Course: Patient is seen and examined.  Patient is a 59 year old female with a past psychiatric history significant for cocaine dependence as well as schizoaffective disorder who presented as a walk-in to the behavioral health hospital on 04/29/2020.  She stated that she was suicidal.  She stated that her family members had her addicted from wherever she was living.  She stated that she had paid rent, but that this family member also has psychiatric problems and has her evicted from every place that she lives.  She admitted that she had continued to abuse cocaine and alcohol the day prior to admission.  She stated that she had had 8  months of sobriety prior to that.  She stated that she had run out of her psychiatric medications several months ago, and wanted to get back on her medications.  She then disclosed that she had legal problems.  She stated that she had a warrant out for her arrest, but wanted to get medications before she went to jail.  She denied suicidal ideation.  No auditory or visual hallucinations.  Upon evaluation this morning the patient does not fulfill criteria for admission.  I told the patient I would give her 7 days worth of Seroquel before we would be able to deal with some of her substance abuse issues she would have to resolve her legal issues.  Physical Findings: AIMS: Facial and Oral Movements Muscles of Facial Expression: None, normal Lips and Perioral Area: None, normal Jaw: None, normal Tongue: None, normal,Extremity Movements Upper (arms, wrists, hands, fingers): None, normal Lower (legs, knees, ankles, toes): None, normal, Trunk Movements Neck, shoulders, hips: None, normal, Overall Severity Severity of abnormal movements (highest score from questions above): None, normal Incapacitation due to abnormal movements: None, normal Patient's awareness of abnormal movements (rate only patient's report): No Awareness, Dental Status Current problems with teeth and/or dentures?: No Does patient usually wear dentures?: No  CIWA:  CIWA-Ar Total: 1 COWS:  COWS Total Score: 1  Musculoskeletal: Strength & Muscle Tone: within normal limits Gait & Station: normal Patient leans: N/A  Psychiatric Specialty Exam: Physical Exam  Nursing note and vitals reviewed. Constitutional: She is oriented to person, place, and time. She appears well-developed and well-nourished.  HENT:  Head: Normocephalic and atraumatic.  Respiratory: Effort normal.  Neurological: She is alert and oriented to person, place, and time.    Review of Systems  Blood pressure (!) 160/79, pulse 79, temperature 98.6 F (37 C),  temperature source Oral, resp. rate 20, SpO2 99 %.There is no height or weight on file to calculate BMI.  General Appearance: Disheveled  Eye Contact:  Fair  Speech:  Normal Rate  Volume:  Normal  Mood:  Dysphoric  Affect:  Congruent  Thought Process:  Coherent and Descriptions of Associations: Circumstantial  Orientation:  Full (Time, Place, and Person)  Thought Content:  Hallucinations: Auditory  Suicidal Thoughts:  No  Homicidal Thoughts:  No  Memory:  Immediate;   Fair Recent;   Fair Remote;   Fair  Judgement:  Intact  Insight:  Lacking  Psychomotor Activity:  Increased  Concentration:  Concentration: Fair and Attention Span: Fair  Recall:  [employer] of Knowledge:  Fair  Language:  Good  Akathisia:  Negative  Handed:  Right  AIMS (if  indicated):     Assets:  Desire for Improvement Resilience  ADL's:  Intact  Cognition:  WNL  Sleep:           Has this patient used any form of tobacco in the last 30 days? (Cigarettes, Smokeless Tobacco, Cigars, and/or Pipes) Yes, No  Blood Alcohol level:  Lab Results  Component Value Date   ETH <10 04/30/2020   ETH <10 44/96/7591    Metabolic Disorder Labs:  Lab Results  Component Value Date   HGBA1C 5.8 (H) 04/30/2020   MPG 119.76 04/30/2020   MPG 122.63 10/03/2019   Lab Results  Component Value Date   PROLACTIN 8.9 04/07/2018   PROLACTIN 14.3 09/23/2015   Lab Results  Component Value Date   CHOL 164 04/30/2020   TRIG 35 04/30/2020   HDL 87 04/30/2020   CHOLHDL 1.9 04/30/2020   VLDL 7 04/30/2020   LDLCALC 70 04/30/2020   LDLCALC NOT CALCULATED 10/03/2019    See Psychiatric Specialty Exam and Suicide Risk Assessment completed by Attending Physician prior to discharge.  Discharge destination:  Home  Is patient on multiple antipsychotic therapies at discharge:  No   Has Patient had three or more failed trials of antipsychotic monotherapy by history:  No  Recommended Plan for Multiple Antipsychotic  Therapies: NA  Discharge Instructions    Diet - low sodium heart healthy   Complete by: As directed    Increase activity slowly   Complete by: As directed      Allergies as of 04/30/2020   No Known Allergies     Medication List    TAKE these medications     Indication  QUEtiapine 200 MG tablet Commonly known as: SEROQUEL Take 1 tablet (200 mg total) by mouth at bedtime for 7 days. For mood control  Indication: Major Depressive Disorder, Mood control        Follow-up recommendations:  Activity:  ad lib Other:  Do not use alcohol, do not use cocaine, rectified legal issues.  If suicidal ideation or other psychiatric issues return then return to the hospital.  Take your medications as directed.  Comments: Patient is instructed to contact legal authorities to resolve her legal issues.  Signed: Sharma Covert, MD 04/30/2020, 10:34 AM

## 2020-04-30 NOTE — BH Assessment (Signed)
Assessment Note  [name] is an 59 y.o. female. Pt presents to [facility] voluntarily unaccompanied for suicidal thoughts. During assessment pt presents to be asleep, very restless. Pt poor historian and gives limited information due to restlessness. Pt first denies SI, HI, AVH, and SIB but admits to one prior SI attempt last week by overdosing on medications. Pt then later retracts and states she is suicidal stating she is tired of living. Pt states she abused cocaine and alcohol the day before, states she drinks daily large cups of beer and used 20$ worth of cocaine. Pt states she currently has no provider but states she is taking medication (Seroquel ) from months ago for sleep but states she still does not sleep very well. Pt endorses symptoms of guilt, worthlessness, irritatble,anxiety, hopelessness. Pt reports only getting 1 hour of sleep and a fair appetite. Throughout the assessment pt continues to doze off falling into and out of sleep but states she is tired of helping others and wants to seek detox and med management. Per pt chart pt has been psychatically hospitilized before last seen at [facility] on 09/2019. Unsure wether pt can contract for safety pt retract suicidal statement several times when asked was she SI.  Diagnosis: Alcohol Use Disorder, severe  Past Medical History:  Past Medical History:  Diagnosis Date  . Anxiety   . Bipolar 1 disorder (HCC)   . Depression   . Drug abuse (HCC)   . Headache   . Schizophrenia [facility])     Past Surgical History:  Procedure Laterality Date  . NO PAST SURGERIES      Family History:  Family History  Problem Relation Age of Onset  . Mental illness Maternal Aunt   . Cancer Maternal Aunt        breast   . Mental illness Paternal Aunt   . Anesthesia problems Neg Hx   . Hypotension Neg Hx   . Malignant hyperthermia Neg Hx   . Pseudochol deficiency Neg Hx     Social History:  reports that she has been smoking cigarettes and cigars. She has a  2.50 pack-year smoking history. She has never used smokeless tobacco. She reports current alcohol use of about 3.0 standard drinks of alcohol per week. She reports current drug use. Drugs: Marijuana and Cocaine.  Additional Social History:  Alcohol / Drug Use Pain Medications: see MAR Prescriptions: see MAR Over the Counter: see MAR  CIWA:   COWS:    Allergies: No Known Allergies  Home Medications: (Not in a hospital admission)   OB/GYN Status:  No LMP recorded. (Menstrual status: Perimenopausal).  General Assessment Data Location of Assessment: [facility] Assessment Services TTS Assessment: In system Is this a Tele or Face-to-Face Assessment?: Face-to-Face Is this an Initial Assessment or a Re-assessment for this encounter?: Initial Assessment Patient Accompanied by:: N/A Language Other than English: No Living Arrangements: Other (Comment) What gender do you identify as?: Female Marital status: Single Pregnancy Status: Unknown Living Arrangements: Non-relatives/Friends Can pt return to current living arrangement?: Yes Admission Status: Voluntary Is patient capable of signing voluntary admission?: Yes Referral Source: Self/Family/Friend Insurance type: none     Crisis Care Plan Living Arrangements: Non-relatives/Friends Legal Guardian: Other:(self) Name of Psychiatrist: none Name of Therapist: none  Education Status Is patient currently in school?: No Is the patient employed, unemployed or receiving disability?: (unknown)  Risk to self with the past 6 months Suicidal Ideation: (pt minimized) Has patient been a risk to self within the past 6 months  prior to admission? : (unknown) Suicidal Intent: No Has patient had any suicidal intent within the past 6 months prior to admission? : No Is patient at risk for suicide?: (unknown) Suicidal Plan?: No Has patient had any suicidal plan within the past 6 months prior to admission? : No Access to Means: No What has been your use  of drugs/alcohol within the last 12 months?: cocaine/alcohol Previous Attempts/Gestures: Yes How many times?: 1 Other Self Harm Risks: past SI attempt Triggers for Past Attempts: Unknown Intentional Self Injurious Behavior: None(pt denies) Family Suicide History: No Recent stressful life event(s): Conflict (Comment), Other (Comment) Persecutory voices/beliefs?: No Depression: Yes Depression Symptoms: Feeling worthless/self pity, Guilt, Isolating, Feeling angry/irritable, Insomnia Substance abuse history and/or treatment for substance abuse?: Yes Suicide prevention information given to non-admitted patients: Not applicable  Risk to Others within the past 6 months Homicidal Ideation: No Does patient have any lifetime risk of violence toward others beyond the six months prior to admission? : No Thoughts of Harm to Others: No Current Homicidal Intent: No Current Homicidal Plan: No Access to Homicidal Means: No Identified Victim: none History of harm to others?: No Assessment of Violence: None Noted Violent Behavior Description: none Does patient have access to weapons?: No Criminal Charges Pending?: No Does patient have a court date: No Is patient on probation?: No  Psychosis Hallucinations: None noted Delusions: None noted  Mental Status Report Appearance/Hygiene: Bizarre, Body odor Eye Contact: Poor Motor Activity: Freedom of movement Speech: Slow, Slurred Level of Consciousness: Sleeping, Restless Mood: Depressed(sleepy) Affect: Depressed Anxiety Level: None Thought Processes: Circumstantial Judgement: Impaired Orientation: Not oriented Obsessive Compulsive Thoughts/Behaviors: None  Cognitive Functioning Concentration: Decreased Memory: Recent Intact Is patient IDD: No Insight: Poor Impulse Control: Poor Appetite: Fair Have you had any weight changes? : No Change Sleep: Decreased Total Hours of Sleep: 1 Vegetative Symptoms: None  ADLScreening [facility] Assessment  Services) Patient's cognitive ability adequate to safely complete daily activities?: Yes Patient able to express need for assistance with ADLs?: Yes Independently performs ADLs?: Yes (appropriate for developmental age)  Prior Inpatient Therapy Prior Inpatient Therapy: Yes Prior Therapy Dates: (unkn)  Prior Outpatient Therapy Prior Outpatient Therapy: Yes Prior Therapy Dates: (unknown) Prior Therapy Facilty/Provider(s): (unknown) Reason for Treatment: (unknown) Does patient have an ACCT team?: No Does patient have Intensive In-House Services?  : No Does patient have [facility] services? : No Does patient have P4CC services?: No  ADL Screening (condition at time of admission) Patient's cognitive ability adequate to safely complete daily activities?: Yes Patient able to express need for assistance with ADLs?: Yes Independently performs ADLs?: Yes (appropriate for developmental age)                Disposition: Adaku, Anike, FNP recommends pt for overnight observation, reassess in the morning. Per [facility] pt accepted to [facility] OBS unit. Disposition Initial Assessment Completed for this Encounter: Yes  On Site Evaluation by:  Antony Contras, [place] with Physician:  Talbot Grumbling, [place] 04/30/2020 1:02 AM

## 2020-04-30 NOTE — Progress Notes (Signed)
Patient discharge home. Patient denies SI/HI and A/V/H. All belongings returned to patient. Patient discharged after speaking with peer support. Patient given a bus pass to get home and escorted off unit by staff.

## 2020-04-30 NOTE — BH Assessment (Addendum)
[facility] Assessment Progress Note  Per Landry Mellow, MD, this voluntary pt does not require psychiatric hospitalization at this time.  Pt is to be discharged from the [facility] Observation Unit with recommendation to follow up with [facility].  This has been included in pt's discharge instructions.  Pt's nurse, Britta Mccreedy, has been notified.  Doylene Canning, MA Triage Specialist [phone]

## 2020-04-30 NOTE — Progress Notes (Signed)
Patient is alert,oriented and ambulatory. Patient is concerned about her daughter and her own mental health. Patient complains of anxiety this morning. Patient provided support and Q 15 checks continues and patient remains safe on unit. Monitoring continues.

## 2020-04-30 NOTE — Progress Notes (Signed)
Patient ID: [name], female   DOB: 1961-02-19, 59 y.o.   MRN: [id number] Pt A&O x 4, very sleepy, not forthcoming with information, presents with passive SI, denies HI or AVH.  Skin search completed, monitoring for safety.  Pt admits to cocaine and alcohol use.  No distress noted, calm & cooperative.

## 2020-04-30 NOTE — H&P (Signed)
BH Observation Unit Provider Admission PAA/H&P  Patient Identification: [name] MRN:  [id number] Date of Evaluation:  04/30/2020 Chief Complaint: SI Principal Diagnosis: Cocaine use disorder, severe, dependence (HCC) Diagnosis:  Principal Problem:   Cocaine use disorder, severe, dependence (HCC)  History of Present Illness:   [name] is an 59 y.o. female who presents to [name] voluntarily as a walk-in brought in by a friend. Pt appears intoxicated and sleepy during assessment. Pt states "I'm tired of living, I want to stop using". Pt reports symptoms of depression, anxiety, irritability, isolation, helplessness, and hopelessness . Pt endorses Si with plans to overdose. Pt endorses AVH sometimes was unable to specify. Pt reports history of suicidal attempt last week by overdose. She denies HI, paranoia and self harm. Pt states he sees no therapist, or psychiatrist. However, pt states she has an old seroquel prescription she takes. Pt endorses daily large cups of alcohol use and used $20 worth of cocaine yesterday. Pt denies any history of abuse or trauma. ot states she sleeps 1hr daily and has a fair appetite. Pt states she is tired of helping others and wants to seek detox and med management.   Associated Signs/Symptoms: Depression Symptoms:  depressed mood, difficulty concentrating, hopelessness, suicidal thoughts with specific plan, anxiety, disturbed sleep, (Hypo) Manic Symptoms:  Impulsivity, Irritable Mood, Anxiety Symptoms:  Excessive Worry, Psychotic Symptoms:  Hallucinations: Auditory Visual PTSD Symptoms: NA Total Time spent with patient: 30 minutes  Past Psychiatric History: Yes  Is the patient at risk to self? Yes.    Has the patient been a risk to self in the past 6 months? No.  Has the patient been a risk to self within the distant past? No.  Is the patient a risk to others? No.  Has the patient been a risk to others in the past 6 months? No.  Has the  patient been a risk to others within the distant past? No.   Prior Inpatient Therapy:   Prior Outpatient Therapy:    Alcohol Screening: 1. How often do you have a drink containing alcohol?: 4 or more times a week 2. How many drinks containing alcohol do you have on a typical day when you are drinking?: 5 or 6 3. How often do you have six or more drinks on one occasion?: Daily or almost daily AUDIT-C Score: 10 4. How often during the last year have you found that you were not able to stop drinking once you had started?: Weekly 5. How often during the last year have you failed to do what was normally expected from you becasue of drinking?: Weekly 6. How often during the last year have you needed a first drink in the morning to get yourself going after a heavy drinking session?: Weekly 7. How often during the last year have you had a feeling of guilt of remorse after drinking?: Less than monthly 8. How often during the last year have you been unable to remember what happened the night before because you had been drinking?: Weekly 9. Have you or someone else been injured as a result of your drinking?: No 10. Has a relative or friend or a doctor or another health worker been concerned about your drinking or suggested you cut down?: Yes, during the last year Alcohol Use Disorder Identification Test Final Score (AUDIT): 27 Alcohol Brief Interventions/Follow-up: Continued Monitoring Substance Abuse History in the last 12 months:  Yes.   Consequences of Substance Abuse: Withdrawal Symptoms:   pt was unable to  specify Previous Psychotropic Medications: Yes  Psychological Evaluations: Yes  Past Medical History:  Past Medical History:  Diagnosis Date  . Anxiety   . Bipolar 1 disorder (HCC)   . Depression   . Drug abuse (HCC)   . Headache   . Schizophrenia [facility])     Past Surgical History:  Procedure Laterality Date  . NO PAST SURGERIES     Family History:  Family History  Problem Relation  Age of Onset  . Mental illness Maternal Aunt   . Cancer Maternal Aunt        breast   . Mental illness Paternal Aunt   . Anesthesia problems Neg Hx   . Hypotension Neg Hx   . Malignant hyperthermia Neg Hx   . Pseudochol deficiency Neg Hx    Family Psychiatric History: see above Tobacco Screening:   Social History:  Social History   Substance and Sexual Activity  Alcohol Use Yes  . Alcohol/week: 3.0 standard drinks  . Types: 3 Cans of beer per week   Comment: 8-10 can daily     Social History   Substance and Sexual Activity  Drug Use Yes  . Types: Marijuana, Cocaine   Comment: occasionally    Additional Social History:                           Allergies:  No Known Allergies Lab Results:  Results for orders placed or performed during the hospital encounter of 04/30/20 (from the past 48 hour(s))  Respiratory Panel by RT PCR (Flu A&B, Covid) - Nasopharyngeal Swab     Status: None   Collection Time: 04/30/20  1:29 AM   Specimen: Nasopharyngeal Swab  Result Value Ref Range   SARS Coronavirus 2 by RT PCR NEGATIVE NEGATIVE    Comment: (NOTE) SARS-CoV-2 target nucleic acids are NOT DETECTED. The SARS-CoV-2 RNA is generally detectable in upper respiratoy specimens during the acute phase of infection. The lowest concentration of SARS-CoV-2 viral copies this assay can detect is 131 copies/mL. A negative result does not preclude SARS-Cov-2 infection and should not be used as the sole basis for treatment or other patient management decisions. A negative result may occur with  improper specimen collection/handling, submission of specimen other than nasopharyngeal swab, presence of viral mutation(s) within the areas targeted by this assay, and inadequate number of viral copies (<131 copies/mL). A negative result must be combined with clinical observations, patient history, and epidemiological information. The expected result is Negative. Fact Sheet for Patients:   https://www.moore.com/ Fact Sheet for Healthcare Providers:  https://www.young.biz/ This test is not yet ap proved or cleared by the [place] FDA and  has been authorized for detection and/or diagnosis of SARS-CoV-2 by FDA under an Emergency Use Authorization (EUA). This EUA will remain  in effect (meaning this test can be used) for the duration of the COVID-19 declaration under Section 564(b)(1) of the Act, 21 U.S.C. section 360bbb-3(b)(1), unless the authorization is terminated or revoked sooner.    Influenza A by PCR NEGATIVE NEGATIVE   Influenza B by PCR NEGATIVE NEGATIVE    Comment: (NOTE) The Xpert Xpress SARS-CoV-2/FLU/RSV assay is intended as an aid in  the diagnosis of influenza from Nasopharyngeal swab specimens and  should not be used as a sole basis for treatment. Nasal washings and  aspirates are unacceptable for Xpert Xpress SARS-CoV-2/FLU/RSV  testing. Fact Sheet for Patients: https://www.moore.com/ Fact Sheet for Healthcare Providers: https://www.young.biz/ This test is not yet  approved or cleared by the [place] and  has been authorized for detection and/or diagnosis of SARS-CoV-2 by  FDA under an Emergency Use Authorization (EUA). This EUA will remain  in effect (meaning this test can be used) for the duration of the  Covid-19 declaration under Section 564(b)(1) of the Act, 21  U.S.C. section 360bbb-3(b)(1), unless the authorization is  terminated or revoked. Performed at [facility], [address], [place], [place] [address]     Blood Alcohol level:  Lab Results  Component Value Date   [facility] <10 10/03/2019   ETH <10 11/05/2018    Metabolic Disorder Labs:  Lab Results  Component Value Date   HGBA1C 5.9 (H) 10/03/2019   MPG 122.63 10/03/2019   MPG 116.89 04/07/2018   Lab Results  Component Value Date   PROLACTIN 8.9 04/07/2018   PROLACTIN  14.3 09/23/2015   Lab Results  Component Value Date   CHOL 165 10/03/2019   TRIG 134 10/03/2019   HDL 90 10/03/2019   CHOLHDL 1.8 10/03/2019   VLDL 27 10/03/2019   LDLCALC NOT CALCULATED 10/03/2019   LDLCALC 86 04/07/2018    Current Medications: No current facility-administered medications for this encounter.   PTA Medications: Medications Prior to Admission  Medication Sig Dispense Refill Last Dose  . QUEtiapine (SEROQUEL) 200 MG tablet Take 1 tablet (200 mg total) by mouth at bedtime. For mood control 30 tablet 0 Unknown at Unknown time    Musculoskeletal: Strength & Muscle Tone: within normal limits Gait & Station: normal Patient leans: N/A  Psychiatric Specialty Exam: Physical Exam  Constitutional: She appears well-developed and well-nourished.  HENT:  Head: Normocephalic.  Eyes: Pupils are equal, round, and reactive to light.  Musculoskeletal:        General: Normal range of motion.     Cervical back: Normal range of motion.  Skin: Skin is warm and dry.  Psychiatric: Her mood appears anxious. Her speech is slurred. Cognition and memory are normal. She expresses impulsivity. She exhibits a depressed mood. She expresses suicidal ideation. She is inattentive.    Review of Systems  Psychiatric/Behavioral: Positive for decreased concentration, dysphoric mood, hallucinations, sleep disturbance and suicidal ideas. Negative for self-injury. The patient is nervous/anxious. The patient is not hyperactive.   All other systems reviewed and are negative.   Blood pressure (!) 160/79, pulse 79, temperature 98.6 F (37 C), temperature source Oral, resp. rate 20, SpO2 99 %.There is no height or weight on file to calculate BMI.  General Appearance: Disheveled  Eye Contact:  Absent  Speech:  Slurred  Volume:  Normal  Mood:  Anxious, Depressed, Dysphoric, Hopeless, Irritable and Worthless  Affect:  Congruent and Depressed  Thought Process:  Coherent and Descriptions of  Associations: Intact  Orientation:  Full (Time, Place, and Person)  Thought Content:  Hallucinations: Pt was unable to specify  Suicidal Thoughts:  Yes.  with intent/plan  Homicidal Thoughts:  No  Memory:  Recent;   Fair  Judgement:  Fair  Insight:  Fair  Psychomotor Activity:  Normal  Concentration:  Concentration: Poor  Recall:  Fair  Fund of Knowledge:  Good  Language:  Good  Akathisia:  No  Handed:  Right  AIMS (if indicated):     Assets:  Communication Skills Desire for Improvement  ADL's:  Impaired  Cognition:  WNL  Sleep:       Disposition: Recommend overnight observation for monitoring and stability Supportive therapy provided about ongoing stressors.  Treatment  Plan Summary: Daily contact with patient to assess and evaluate symptoms and progress in treatment and Medication management  Observation Level/Precautions:  15 minute checks Laboratory:  Chemistry Profile UDS Psychotherapy:   Medications:   Consultations:   Discharge Concerns:   Estimated LOS: Other:      Mliss Fritz, NP 5/7/20215:04 AM

## 2020-04-30 NOTE — Discharge Instructions (Signed)
For your mental health needs, you are advised to follow up with [facility].  Call them at your earliest opportunity to schedule an intake appointment:       [facility]      [address]      [place], [place] [address]      [phone]      Crisis number: [phone]  

## 2020-04-30 NOTE — Plan of Care (Signed)
[facility] Observation Crisis Plan  Reason for Crisis Plan:  Crisis Stabilization   Plan of Care:  Referral for Inpatient Hospitalization  Family Support:      Current Living Environment:     Insurance:   Hospital Account    Name Acct ID Class Status Primary Coverage   [name], [name] [id number] BEHAVIORAL HEALTH OBSERVATION Open [employer] MEDICARE - UHC MEDICARE        Guarantor Account (for Hospital Account [id number])    Name Relation to Pt Service Area Active? Acct Type   [name] Self [facility] Yes [facility]   Address Phone       [address] [place], [place] [address] [phone](H)          Coverage Information (for Hospital Account [id number])    F/O Payor/Plan Precert #   [facility] MEDICARE/UHC MEDICARE    Subscriber Subscriber #   [name], [name] [id number]   Address Phone   [address] [place], [place] [address] [phone]      Legal Guardian:     Primary Care Provider:  Massie Maroon, FNP  Current Outpatient Providers:  none  Psychiatrist:     Counselor/Therapist:     Compliant with Medications:  No  Additional Information:   Tasia Catchings 5/7/20212:46 AM

## 2020-06-01 ENCOUNTER — Encounter: Payer: Self-pay | Admitting: Family Medicine

## 2021-04-09 ENCOUNTER — Other Ambulatory Visit: Payer: Self-pay

## 2021-04-09 ENCOUNTER — Emergency Department (HOSPITAL_COMMUNITY): Payer: Medicare Other

## 2021-04-09 ENCOUNTER — Emergency Department (HOSPITAL_COMMUNITY)
Admission: EM | Admit: 2021-04-09 | Discharge: 2021-04-09 | Disposition: A | Discharge status: Home / Self Care | Payer: Medicare Other | Attending: Emergency Medicine | Admitting: Emergency Medicine

## 2021-04-09 DIAGNOSIS — N63 Unspecified lump in unspecified breast: Secondary | ICD-10-CM

## 2021-04-09 DIAGNOSIS — R079 Chest pain, unspecified: Secondary | ICD-10-CM

## 2021-04-09 DIAGNOSIS — N6320 Unspecified lump in the left breast, unspecified quadrant: Secondary | ICD-10-CM | POA: Insufficient documentation

## 2021-04-09 DIAGNOSIS — I444 Left anterior fascicular block: Secondary | ICD-10-CM | POA: Insufficient documentation

## 2021-04-09 DIAGNOSIS — F17210 Nicotine dependence, cigarettes, uncomplicated: Secondary | ICD-10-CM | POA: Diagnosis not present

## 2021-04-09 DIAGNOSIS — R0789 Other chest pain: Secondary | ICD-10-CM | POA: Diagnosis present

## 2021-04-09 LAB — CBC WITH DIFFERENTIAL/PLATELET
Abs Immature Granulocytes: 0.02 10*3/uL (ref 0.00–0.07)
Basophils Absolute: 0 10*3/uL (ref 0.0–0.1)
Basophils Relative: 1 %
Eosinophils Absolute: 0.2 10*3/uL (ref 0.0–0.5)
Eosinophils Relative: 4 %
HCT: 48.9 % — ABNORMAL HIGH (ref 36.0–46.0)
Hemoglobin: 15.7 g/dL — ABNORMAL HIGH (ref 12.0–15.0)
Immature Granulocytes: 1 %
Lymphocytes Relative: 35 %
Lymphs Abs: 1.5 10*3/uL (ref 0.7–4.0)
MCH: 29.8 pg (ref 26.0–34.0)
MCHC: 32.1 g/dL (ref 30.0–36.0)
MCV: 92.8 fL (ref 80.0–100.0)
Monocytes Absolute: 0.4 10*3/uL (ref 0.1–1.0)
Monocytes Relative: 10 %
Neutro Abs: 2.1 10*3/uL (ref 1.7–7.7)
Neutrophils Relative %: 49 %
Platelets: 214 10*3/uL (ref 150–400)
RBC: 5.27 MIL/uL — ABNORMAL HIGH (ref 3.87–5.11)
RDW: 14.8 % (ref 11.5–15.5)
WBC: 4.2 10*3/uL (ref 4.0–10.5)
nRBC: 0 % (ref 0.0–0.2)

## 2021-04-09 LAB — BASIC METABOLIC PANEL
Anion gap: 11 (ref 5–15)
BUN: 11 mg/dL (ref 6–20)
CO2: 25 mmol/L (ref 22–32)
Calcium: 9.3 mg/dL (ref 8.9–10.3)
Chloride: 104 mmol/L (ref 98–111)
Creatinine, Ser: 0.86 mg/dL (ref 0.44–1.00)
GFR, Estimated: >60 mL/min (ref >60)
Glucose, Bld: 64 mg/dL — ABNORMAL LOW (ref 70–99)
Potassium: 3.7 mmol/L (ref 3.5–5.1)
Sodium: 140 mmol/L (ref 135–145)

## 2021-04-09 LAB — I-STAT BETA HCG BLOOD, ED (MC, WL, AP ONLY): I-stat hCG, quantitative: <5 m[IU]/mL (ref <5)

## 2021-04-09 LAB — TROPONIN I (HIGH SENSITIVITY)
Troponin I (High Sensitivity): 5 ng/L (ref <18)
Troponin I (High Sensitivity): 6 ng/L (ref <18)

## 2021-04-09 MED ORDER — IOHEXOL 350 MG/ML SOLN
75.0000 mL | Freq: Once | INTRAVENOUS | Status: AC | PRN
  Administered 2021-04-09: 75 mL via INTRAVENOUS

## 2021-04-09 NOTE — ED Triage Notes (Signed)
Pt brought to ED via EMS from store with c/o central CP that began about 2 hours while pt was smoking crack. Pt states pain feels like pressure, has happened one time before. 324 asp, 1 nitro given by EMS PTA. Pt alert and oriented x 4 on arrival to ED, rates pain 9/10, VSS.  EMS v/s: 158/60

## 2021-04-09 NOTE — Discharge Instructions (Addendum)
Your work-up today was entirely reassuring from a chest pain standpoint.  Fortunately there is no evidence of acute cardiac or pulmonary disease.  I suspect that your chest pain, worse with inspiration, is due to inhalation injury from your cocaine use earlier this morning.  I highly encourage you to follow-up on the resources available for outpatient substance use.  Here is the reason from your CT of the chest:  IMPRESSION:  1. No acute findings. No pulmonary embolism seen, with mild study  limitations detailed above. No pneumomediastinum.  2. Lungs are clear.  No evidence of pneumonia.  3. **An incidental finding of potential clinical significance has  been found. 9 mm mass within the superficial soft tissues of the  upper LEFT breast, of uncertain etiology. Recommend nonemergent  correlation with diagnostic mammograms and possible ultrasound at a  dedicated breast center.   Given the findings of this 9 mm mass in upper left breast, it is important that you obtain a mammogram for further evaluation.  Please follow-up with your primary care provider at [facility] for ongoing evaluation and management.  If you no longer have one and need to get established, please call [facility] here in [place].  Return to the ED or seek immediate medical attention should you experience any new or worsening symptoms.

## 2021-04-09 NOTE — ED Provider Notes (Signed)
[facility] EMERGENCY DEPARTMENT Provider Note   CSN: [id number] Arrival date & time: 04/09/21  2094     History No chief complaint on file.   [name] is a 60 y.o. female with past medical history significant for bipolar 1 disorder, schizophrenia, and polysubstance abuse who presents to the ED with complaints of central chest pain that began suddenly after smoking crack cocaine approximately 1 hour prior to arrival.  Patient reports that she was with friends smoking crack cocaine and was feeling perfectly fine until suddenly, shortly after inhalation, she developed a central chest "pressure" with associated palpitations and dizziness.  She states that this lasted for quite some time.  EMS gave her aspirin and nitroglycerin in route to the ED.  While she initially complained of 9 out of 10 pain on arrival to the ED, she states that it has now begun to improve.  She describes it as a chest "tightness".  She still endorses pain with deep inspiration.  She denies any recent fevers, chills, cough, abdominal pain, nausea or vomiting, diaphoresis, syncope, or other symptoms.  HPI     Past Medical History:  Diagnosis Date  . Anxiety   . Bipolar 1 disorder (HCC)   . Depression   . Drug abuse (HCC)   . Headache   . Schizophrenia [facility])     Patient Active Problem List   Diagnosis Date Noted  . Cocaine dependence, continuous use (HCC) 04/30/2020  . Schizoaffective disorder (HCC) 11/05/2018  . Bacterial vaginosis 03/01/2017  . Schizoaffective disorder, bipolar type (HCC) 05/18/2016  . Annual physical exam 02/15/2016  . Encounter for routine gynecological examination 02/15/2016  . Breast cancer screening 02/15/2016  . Affective psychosis, bipolar (HCC)   . Cannabis use disorder, moderate, dependence (HCC) 09/22/2015  . Cocaine use disorder, severe, dependence (HCC) 09/22/2015  . UTI (lower urinary tract infection) 09/04/2012    Past Surgical History:   Procedure Laterality Date  . NO PAST SURGERIES       OB History   No obstetric history on file.     Family History  Problem Relation Age of Onset  . Mental illness Maternal Aunt   . Cancer Maternal Aunt        breast   . Mental illness Paternal Aunt   . Anesthesia problems Neg Hx   . Hypotension Neg Hx   . Malignant hyperthermia Neg Hx   . Pseudochol deficiency Neg Hx     Social History   Tobacco Use  . Smoking status: Current Every Day Smoker    Packs/day: 0.25    Years: 10.00    Pack years: 2.50    Types: Cigarettes, Cigars  . Smokeless tobacco: Never Used  Vaping Use  . Vaping Use: Never used  Substance Use Topics  . Alcohol use: Yes    Alcohol/week: 3.0 standard drinks    Types: 3 Cans of beer per week    Comment: 8-10 can daily  . Drug use: Yes    Types: Marijuana, Cocaine    Comment: occasionally    Home Medications Prior to Admission medications   Medication Sig Start Date End Date Taking? Authorizing Provider  Multiple Vitamins-Minerals (ONE-A-DAY WOMENS 50+) TABS Take 1 tablet by mouth daily.   Yes [provider]  QUEtiapine (SEROQUEL XR) 50 MG TB24 24 hr tablet Take 50 mg by mouth daily. 01/31/21  Yes [provider]  QUEtiapine (SEROQUEL) 200 MG tablet Take 200 mg by mouth at bedtime. 04/30/20  Yes  [provider]    Allergies    Patient has no known allergies.  Review of Systems   Review of Systems  All other systems reviewed and are negative.   Physical Exam Updated Vital Signs BP 138/84   Pulse 67   Temp 98.3 F (36.8 C) (Oral)   Resp 17   Ht 5' (1.524 m)   Wt 52.2 kg   SpO2 98%   BMI 22.46 kg/m   Physical Exam Vitals and nursing note reviewed. Exam conducted with a chaperone present.  Constitutional:      Appearance: She is not toxic-appearing.  HENT:     Head: Normocephalic and atraumatic.  Eyes:     General: No scleral icterus.    Conjunctiva/sclera: Conjunctivae normal.  Cardiovascular:      Rate and Rhythm: Normal rate.     Pulses: Normal pulses.  Pulmonary:     Effort: Pulmonary effort is normal. No respiratory distress.     Breath sounds: Normal breath sounds. No wheezing or rales.     Comments: CTA bilaterally.  No oxygen requirements or increased work of breathing. Abdominal:     General: Abdomen is flat. There is no distension.     Palpations: Abdomen is soft.     Tenderness: There is no abdominal tenderness.  Musculoskeletal:     Right lower leg: No edema.     Left lower leg: No edema.  Skin:    General: Skin is dry.  Neurological:     General: No focal deficit present.     Mental Status: She is alert and oriented to person, place, and time.     GCS: GCS eye subscore is 4. GCS verbal subscore is 5. GCS motor subscore is 6.  Psychiatric:        Mood and Affect: Mood normal.        Behavior: Behavior normal.        Thought Content: Thought content normal.     ED Results / Procedures / Treatments   Labs (all labs ordered are listed, but only abnormal results are displayed) Labs Reviewed  CBC WITH DIFFERENTIAL/PLATELET - Abnormal; Notable for the following components:      Result Value   RBC 5.27 (*)    Hemoglobin 15.7 (*)    HCT 48.9 (*)    All other components within normal limits  BASIC METABOLIC PANEL - Abnormal; Notable for the following components:   Glucose, Bld 64 (*)    All other components within normal limits  I-STAT BETA HCG BLOOD, ED ([facility], [facility], [facility] ONLY)  TROPONIN I (HIGH SENSITIVITY)  TROPONIN I (HIGH SENSITIVITY)    EKG EKG Interpretation  Date/Time:  Saturday April 09 2021 06:15:32 EDT Ventricular Rate:  58 PR Interval:  113 QRS Duration: 88 QT Interval:  445 QTC Calculation: 438 R Axis:   -61 Text Interpretation: Sinus rhythm Borderline short PR interval Left anterior fascicular block Probable left ventricular hypertrophy Anterior Q waves, possibly due to LVH Confirmed by Zadie Rhine ([id number]) on 04/09/2021 6:29:28  AM   Radiology CT Angio Chest PE W and/or Wo Contrast  Result Date: 04/09/2021 CLINICAL DATA:  Sudden onset chest pain. Drug use. PE suspected. Possible pneumomediastinum. EXAM: CT ANGIOGRAPHY CHEST WITH CONTRAST TECHNIQUE: Multidetector CT imaging of the chest was performed using the standard protocol during bolus administration of intravenous contrast. Multiplanar CT image reconstructions and MIPs were obtained to evaluate the vascular anatomy. CONTRAST:  96mL OMNIPAQUE IOHEXOL 350 MG/ML SOLN COMPARISON:  Chest x-ray  from earlier same day. FINDINGS: Cardiovascular: Some of the peripheral segmental and subsegmental pulmonary artery branches cannot be definitively characterized due to patient breathing motion artifact, however, there is no pulmonary embolism identified within the main, lobar or segmental pulmonary arteries bilaterally. Heart size is within normal limits. No pericardial effusion. No thoracic aortic aneurysm. Mediastinum/Nodes: No mass or enlarged lymph nodes are seen within the mediastinum. No pneumomediastinum. Esophagus is unremarkable. Trachea and central bronchi are unremarkable. Lungs/Pleura: Lungs are clear. No pleural effusion or pneumothorax is seen.w Upper Abdomen: Limited images of the upper abdomen are unremarkable. Musculoskeletal: No acute appearing osseous abnormality. Old healed fracture of the posterior LEFT tenth rib. 9 mm mass within the superficial soft tissues of the upper LEFT breast (series 5, image 40). Review of the MIP images confirms the above findings. IMPRESSION: 1. No acute findings. No pulmonary embolism seen, with mild study limitations detailed above. No pneumomediastinum. 2. Lungs are clear.  No evidence of pneumonia. 3. **An incidental finding of potential clinical significance has been found. 9 mm mass within the superficial soft tissues of the upper LEFT breast, of uncertain etiology. Recommend nonemergent correlation with diagnostic mammograms and possible  ultrasound at a dedicated breast center. Electronically Signed   By: Bary Richard M.D.   On: 04/09/2021 13:20   DG Chest Portable 1 View  Result Date: 04/09/2021 CLINICAL DATA:  Chest pain, drug abuse. EXAM: PORTABLE CHEST 1 VIEW COMPARISON:  Chest x-ray dated 04/01/2018. FINDINGS: Heart size and mediastinal contours are within normal limits. Lungs are clear. No pleural effusion or pneumothorax is seen. Osseous structures about the chest are unremarkable. IMPRESSION: No active disease. No evidence of pneumonia or pulmonary edema. Electronically Signed   By: Bary Richard M.D.   On: 04/09/2021 07:43    Procedures Procedures   Medications Ordered in ED Medications  iohexol (OMNIPAQUE) 350 MG/ML injection 75 mL (75 mLs Intravenous Contrast Given 04/09/21 1255)    ED Course  I have reviewed the triage vital signs and the nursing notes.  Pertinent labs & imaging results that were available during my care of the patient were reviewed by me and considered in my medical decision making (see chart for details).    MDM Rules/Calculators/A&P                          [name] was evaluated in Emergency Department on 04/09/2021 for the symptoms described in the history of present illness. She was evaluated in the context of the global COVID-19 pandemic, which necessitated consideration that the patient might be at risk for infection with the SARS-CoV-2 virus that causes COVID-19. Institutional protocols and algorithms that pertain to the evaluation of patients at risk for COVID-19 are in a state of rapid change based on information released by regulatory bodies including the CDC and federal and state organizations. These policies and algorithms were followed during the patient's care in the ED.  I personally reviewed patient's medical chart and all notes from triage and staff during today's encounter. I have also ordered and reviewed all labs and imaging that I felt to be medically necessary in  the evaluation of this patient's complaints and with consideration of their physical exam. If needed, translation services were available and utilized.   Plain films of chest are personally reviewed and without any acute cardiopulmonary disease.  Specifically, no obvious pneumomediastinum or spontaneous pneumothorax.    EKG is reviewed and demonstrates sinus rhythm with left anterior fascicular  block.  Labs are thus far unremarkable.  Initial troponin of 5, given acuity will trend x2.  Given her ongoing pleuritic symptoms and sudden onset chest pain after deep inspiration of crack cocaine, will proceed with CTA PE study.  If troponin and CTA are negative, stable for discharge home.  Will provide attachment on local resources for substance use disorder.  Discussed case with Dr. Rush Landmarkegeler who agrees with assessment and plan.    Negative delta troponin.  IMPRESSION:  1. No acute findings. No pulmonary embolism seen, with mild study  limitations detailed above. No pneumomediastinum.  2. Lungs are clear. No evidence of pneumonia.  3. **An incidental finding of potential clinical significance has  been found. 9 mm mass within the superficial soft tissues of the  upper LEFT breast, of uncertain etiology. Recommend nonemergent  correlation with diagnostic mammograms and possible ultrasound at a  dedicated breast center.   I posted the above findings in her discharge paperwork and encourage close outpatient follow-up for mammogram.  She is feeling improved after resting in the ED.  ED return precautions discussed.  Patient voices understanding and is agreeable to the plan.  Final Clinical Impression(s) / ED Diagnoses Final diagnoses:  Nonspecific chest pain    Rx / DC Orders ED Discharge Orders    None       Lorelee NewGreen, Shawnette Augello L, PA-C 04/09/21 1420    Tegeler, Canary Brimhristopher J, MD 04/09/21 [phone]

## 2021-04-09 NOTE — ED Notes (Signed)
Reviewed discharge instructions with patient. Follow-up care reviewed. Patient verbalized understanding. Patient A&Ox4, VSS, and ambulatory with steady gait upon discharge.  

## 2021-04-29 ENCOUNTER — Other Ambulatory Visit: Payer: Self-pay

## 2021-04-29 ENCOUNTER — Ambulatory Visit (HOSPITAL_COMMUNITY)
Admission: RE | Admit: 2021-04-29 | Discharge: 2021-04-29 | Disposition: A | Discharge status: Home / Self Care | Payer: Medicare Other | Source: Home / Self Care | Attending: Psychiatry | Admitting: Psychiatry

## 2021-04-29 ENCOUNTER — Ambulatory Visit (HOSPITAL_COMMUNITY)
Admission: EM | Admit: 2021-04-29 | Discharge: 2021-05-02 | Disposition: A | Discharge status: Home / Self Care | Payer: Medicare Other | Attending: Student | Admitting: Student

## 2021-04-29 DIAGNOSIS — R45851 Suicidal ideations: Secondary | ICD-10-CM | POA: Insufficient documentation

## 2021-04-29 DIAGNOSIS — Z79899 Other long term (current) drug therapy: Secondary | ICD-10-CM | POA: Insufficient documentation

## 2021-04-29 DIAGNOSIS — F1410 Cocaine abuse, uncomplicated: Secondary | ICD-10-CM | POA: Insufficient documentation

## 2021-04-29 DIAGNOSIS — F32A Depression, unspecified: Secondary | ICD-10-CM | POA: Diagnosis not present

## 2021-04-29 DIAGNOSIS — Z733 Stress, not elsewhere classified: Secondary | ICD-10-CM | POA: Diagnosis not present

## 2021-04-29 DIAGNOSIS — F17210 Nicotine dependence, cigarettes, uncomplicated: Secondary | ICD-10-CM | POA: Insufficient documentation

## 2021-04-29 DIAGNOSIS — Z20822 Contact with and (suspected) exposure to covid-19: Secondary | ICD-10-CM | POA: Diagnosis not present

## 2021-04-29 DIAGNOSIS — F1420 Cocaine dependence, uncomplicated: Secondary | ICD-10-CM | POA: Diagnosis not present

## 2021-04-29 DIAGNOSIS — Z630 Problems in relationship with spouse or partner: Secondary | ICD-10-CM | POA: Diagnosis not present

## 2021-04-29 DIAGNOSIS — F250 Schizoaffective disorder, bipolar type: Secondary | ICD-10-CM | POA: Insufficient documentation

## 2021-04-29 DIAGNOSIS — Z9151 Personal history of suicidal behavior: Secondary | ICD-10-CM | POA: Diagnosis not present

## 2021-04-29 DIAGNOSIS — R443 Hallucinations, unspecified: Secondary | ICD-10-CM | POA: Diagnosis present

## 2021-04-29 MED ORDER — MAGNESIUM HYDROXIDE 400 MG/5ML PO SUSP: 30.0000 mL | Freq: Every day | ORAL | Status: DC | PRN

## 2021-04-29 MED ORDER — QUETIAPINE FUMARATE 50 MG PO TABS
50.0000 mg | ORAL_TABLET | Freq: Every day | ORAL | Status: DC
  Administered 2021-04-30: 50 mg via ORAL
  Filled 2021-04-29: qty 1

## 2021-04-29 MED ORDER — ALUM & MAG HYDROXIDE-SIMETH 200-200-20 MG/5ML PO SUSP: 30.0000 mL | ORAL | Status: DC | PRN

## 2021-04-29 MED ORDER — ACETAMINOPHEN 325 MG PO TABS: 650.0000 mg | ORAL_TABLET | Freq: Four times a day (QID) | ORAL | Status: DC | PRN

## 2021-04-29 MED ORDER — HYDROXYZINE HCL 25 MG PO TABS
25.0000 mg | ORAL_TABLET | Freq: Three times a day (TID) | ORAL | Status: DC | PRN
  Filled 2021-04-29: qty 1
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
## 2021-04-29 NOTE — ED Notes (Signed)
[facility] #[id number] - direct admit

## 2021-04-29 NOTE — ED Provider Notes (Signed)
Behavioral Health Admission H&P [facility] & OBS)  Date: 04/30/21 Patient Name: [name] MRN: [id number] Chief Complaint:  Chief Complaint  Patient presents with  . Suicidal  . Hallucinations      Diagnoses:  Final diagnoses:  Schizoaffective disorder, bipolar type (HCC)  Cocaine use disorder, severe, dependence (HCC)    HPI: [name] is a 60 year old female with history of schizoaffective disorder, bipolar type, bipolar disorder, and cocaine abuse who presents to the behavioral health urgent care as a voluntary direct admit transfer from [facility].  Patient presented to [facility] on 04/29/2021 and was evaluated by TTS and provider at that time.  Upon assessment at [facility], it was recommended for the patient to be transferred to [facility] for continuous observation/assessment.  Please see [name], [facility] 04/29/2021 TTS assessment and Stephanie Asper, NP 04/29/2021 H&P note for further details if necessary regarding the patient's assessment that took place at [facility].   [facility].  Patient states that she went to [facility] because she had been "off meds for a while" and was experiencing suicidal ideation with a plan to cut herself with a razor blade or overdose on pills.  Patient continues to endorse active suicidal ideation on exam with the same plan of cutting herself with a razor blade or overdosing on pills mentioned above.  Patient endorses 2 past suicide attempt in which she states that she slit her wrist with a razor blade 5 to 6 years ago, as well as overdosed on pills 5 to 6 years ago.  Patient denies HI.  She endorses having intermittent auditory and visual hallucinations for greater than 6 months but she does state that over the past week she has experienced intermittent visual hallucinations of "seeing shadows" and "seeing people in my food".  Patient states that she last experienced these visual hallucinations on the afternoon of  04/29/2021.  Patient reports that she was living in [place] and had an ACT team through [facility].  However, she states that she moved to [facility] in January/February 2022 and since she moved to [place] she has not been able to continue with an ACT team in [place].  Patient states that she was taking Seroquel 50 mg during the day and 200 mg at night but she states that she has not taken these medications for at least 2 to 3 weeks and she states that there is nobody prescribing them for her at this time.  She states she has not been taking any additional psychotropic medications.  She endorses poor sleep, anhedonia, feelings of guilt/hopelessness/worthlessness, poor concentration, decreased energy, and weight loss of 10 pounds recently but denies any appetite changes.  Patient denies auditory hallucinations or delusions.  Patient does endorse feeling paranoid that she is unable to describe her paranoia further when asked to provide details.  Patient states that she lives in [place] since January 2022 with her boyfriend.  She denies access to guns or weapons.  She reports that she relapsed on alcohol and cocaine 2 days ago.  Patient states that 2 days ago she drank 3 beers and used about $50 worth of cocaine.  Patient reports history of alcohol abuse and cocaine abuse but she is unable to provide details regarding how long she was sober for before this relapse and how often/how much of alcohol and cocaine she was using prior to her period of sobriety.  Patient also reports that her most recent stressor that is making her  feel suicidal is that she discovered that she had a breast mass/breast cancer at the emergency department recently.  Per chart review, patient had CT angio chest PE with and/or without contrast on 04/09/2021 which shows the following impression: "**An incidental finding of potential clinical significance has been found. 9 mm mass within the superficial soft tissues of the upper LEFT  breast, of uncertain etiology. Recommend nonemergent correlation with diagnostic mammograms and possible ultrasound at a dedicated breast center."  Per EDP note on 04/09/2021, EDP noted that he did not think this finding was emergent and that he recommended follow-up with diagnostic mammogram and potential ultrasound at a breast facility.  Patient denies history of alcohol withdrawal symptoms or seizures.  Patient reports smoking 1 pack of cigarettes every 3 to 4 days for the past 10 years.  Patient denies any substance use.  Per chart review, patient was admitted to the psychiatric service at [facility] health in [place] in January 2022.  Chart review shows that patient's last admission account behavioral health Hospital was in May 2021.  PHQ 2-9:   Flowsheet Row ED from 04/29/2021 in [facility] Most recent reading at 04/30/2021  1:36 AM OP Visit from 04/29/2021 [facility] Most recent reading at 04/29/2021  7:47 PM ED from 04/09/2021 in [facility] EMERGENCY DEPARTMENT Most recent reading at 04/09/2021  6:21 AM  C-SSRS RISK CATEGORY Low Risk High Risk No Risk       Total Time spent with patient: 30 minutes  Musculoskeletal  Strength & Muscle Tone: within normal limits Gait & Station: normal Patient leans: N/A  Psychiatric Specialty Exam  Presentation General Appearance: Bizarre  Eye Contact:Fair; Fleeting  Speech:Clear and Coherent; Normal Rate  Speech Volume:Normal  Handedness:No data recorded  Mood and Affect  Mood:Anxious  Affect:Congruent   Thought Process  Thought Processes:Coherent; Goal Directed  Descriptions of Associations:Circumstantial  Orientation:Full (Time, Place and Person)  Thought Content:Paranoid Ideation; Scattered  Diagnosis of Schizophrenia or Schizoaffective disorder in past: Yes  Duration of Psychotic Symptoms: Greater than six months  Hallucinations:Hallucinations:  Visual Description of Visual Hallucinations: See HPI  Ideas of Reference:Paranoia  Suicidal Thoughts:Suicidal Thoughts: Yes, Active SI Active Intent and/or Plan: With Intent; With Plan; With Means to Carry Out; With Access to Means  Homicidal Thoughts:Homicidal Thoughts: No   Sensorium  Memory:Immediate Fair; Recent Fair; Remote Fair  Judgment:Fair  Insight:Fair   Executive Functions  Concentration:Fair  Attention Span:Fair  Recall:Fair  Fund of Knowledge:Fair  Language:Fair   Psychomotor Activity  Psychomotor Activity:Psychomotor Activity: Normal   Assets  Assets:Communication Skills; Desire for Improvement; Financial Resources/Insurance; Housing; Leisure Time; Physical Health   Sleep  Sleep:Sleep: Poor   Nutritional Assessment (For OBS and FBC admissions only) Has the patient had a weight loss or gain of 10 pounds or more in the last 3 months?: Yes Has the patient had a decrease in food intake/or appetite?: No Does the patient have dental problems?: No Does the patient have eating habits or behaviors that may be indicators of an eating disorder including binging or inducing vomiting?: No Has the patient recently lost weight without trying?: Yes, 2-13 lbs. Has the patient been eating poorly because of a decreased appetite?: No Malnutrition Screening Tool Score: 1    Physical Exam Vitals reviewed.  Constitutional:      General: She is not in acute distress.    Appearance: She is not ill-appearing, toxic-appearing or diaphoretic.  HENT:     Head:  Normocephalic and atraumatic.     Right Ear: External ear normal.     Left Ear: External ear normal.  Cardiovascular:     Rate and Rhythm: Normal rate.  Pulmonary:     Effort: Pulmonary effort is normal. No respiratory distress.  Musculoskeletal:        General: Normal range of motion.     Cervical back: Normal range of motion.  Neurological:     Mental Status: She is oriented to person, place, and time.      Comments: No tremor noted.   Psychiatric:        Attention and Perception: She perceives visual hallucinations. She does not perceive auditory hallucinations.        Mood and Affect: Mood is anxious.        Speech: Speech normal.        Behavior: Behavior is not agitated, slowed, aggressive, withdrawn, hyperactive or combative. Behavior is cooperative.        Thought Content: Thought content is paranoid. Thought content is not delusional. Thought content includes suicidal ideation. Thought content does not include homicidal ideation. Thought content includes suicidal plan.     Comments: Affect mood congruent.     Review of Systems  Constitutional: Positive for malaise/fatigue and weight loss. Negative for chills, diaphoresis and fever.  HENT: Negative for congestion.   Respiratory: Negative for cough and shortness of breath.   Cardiovascular: Negative for chest pain and palpitations.  Gastrointestinal: Negative for abdominal pain, constipation, diarrhea, nausea and vomiting.  Musculoskeletal: Negative for joint pain and myalgias.  Neurological: Negative for dizziness, seizures and headaches.  Psychiatric/Behavioral: Positive for depression, hallucinations, substance abuse and suicidal ideas. Negative for memory loss. The patient is nervous/anxious and has insomnia.   All other systems reviewed and are negative.   Vitals: Blood pressure 134/77, pulse 68, temperature 97.7 F (36.5 C), temperature source Temporal, resp. rate 16, SpO2 100 %. There is no height or weight on file to calculate BMI.  Past Psychiatric History: Schizoaffective Disorder, Bipolar Type, Bipolar Disorder, Cocaine Use Disorder    Is the patient at risk to self? Yes  Has the patient been a risk to self in the past 6 months? No .    Has the patient been a risk to self within the distant past? No   Is the patient a risk to others? No   Has the patient been a risk to others in the past 6 months? No   Has the  patient been a risk to others within the distant past? No   Past Medical History:  Past Medical History:  Diagnosis Date  . Anxiety   . Bipolar 1 disorder (HCC)   . Depression   . Drug abuse (HCC)   . Headache   . Schizophrenia [facility])     Past Surgical History:  Procedure Laterality Date  . NO PAST SURGERIES      Family History:  Family History  Problem Relation Age of Onset  . Mental illness Maternal Aunt   . Cancer Maternal Aunt        breast   . Mental illness Paternal Aunt   . Anesthesia problems Neg Hx   . Hypotension Neg Hx   . Malignant hyperthermia Neg Hx   . Pseudochol deficiency Neg Hx     Social History:  Social History   Socioeconomic History  . Marital status: Single    Spouse name: Not on file  . Number of children: Not on file  .  Years of education: Not on file  . Highest education level: Not on file  Occupational History  . Not on file  Tobacco Use  . Smoking status: Current Every Day Smoker    Packs/day: 0.25    Years: 10.00    Pack years: 2.50    Types: Cigarettes, Cigars  . Smokeless tobacco: Never Used  Vaping Use  . Vaping Use: Never used  Substance and Sexual Activity  . Alcohol use: Yes    Alcohol/week: 3.0 standard drinks    Types: 3 Cans of beer per week    Comment: 8-10 can daily  . Drug use: Yes    Types: Marijuana, Cocaine    Comment: occasionally  . Sexual activity: Not Currently  Other Topics Concern  . Not on file  Social History Narrative  . Not on file   Social Determinants of Health   Financial Resource Strain: Not on file  Food Insecurity: Not on file  Transportation Needs: Not on file  Physical Activity: Not on file  Stress: Not on file  Social Connections: Not on file  Intimate Partner Violence: Not on file    SDOH:  SDOH Screenings   Alcohol Screen: Not on file  Depression (UJW1-1(PHQ2-9): Not on file  Financial Resource Strain: Not on file  Food Insecurity: Not on file  Housing: Not on file  Physical  Activity: Not on file  Social Connections: Not on file  Stress: Not on file  Tobacco Use: High Risk  . Smoking Tobacco Use: Current Every Day Smoker  . Smokeless Tobacco Use: Never Used  Transportation Needs: Not on file    Last Labs:  Admission on 04/29/2021  Component Date Value Ref Range Status  . SARS Coronavirus 2 by RT PCR 04/30/2021 NEGATIVE  NEGATIVE Final   Comment: (NOTE) SARS-CoV-2 target nucleic acids are NOT DETECTED.  The SARS-CoV-2 RNA is generally detectable in upper respiratory specimens during the acute phase of infection. The lowest concentration of SARS-CoV-2 viral copies this assay can detect is 138 copies/mL. A negative result does not preclude SARS-Cov-2 infection and should not be used as the sole basis for treatment or other patient management decisions. A negative result may occur with  improper specimen collection/handling, submission of specimen other than nasopharyngeal swab, presence of viral mutation(s) within the areas targeted by this assay, and inadequate number of viral copies(<138 copies/mL). A negative result must be combined with clinical observations, patient history, and epidemiological information. The expected result is Negative.  Fact Sheet for Patients:  BloggerCourse.comhttps://www.fda.gov/media/152166/download  Fact Sheet for Healthcare Providers:  SeriousBroker.ithttps://www.fda.gov/media/152162/download  This test is no                          t yet approved or cleared by the [place] FDA and  has been authorized for detection and/or diagnosis of SARS-CoV-2 by FDA under an Emergency Use Authorization (EUA). This EUA will remain  in effect (meaning this test can be used) for the duration of the COVID-19 declaration under Section 564(b)(1) of the Act, 21 U.S.C.section 360bbb-3(b)(1), unless the authorization is terminated  or revoked sooner.      . Influenza A by PCR 04/30/2021 NEGATIVE  NEGATIVE Final  . Influenza B by PCR 04/30/2021 NEGATIVE   NEGATIVE Final   Comment: (NOTE) The Xpert Xpress SARS-CoV-2/FLU/RSV plus assay is intended as an aid in the diagnosis of influenza from Nasopharyngeal swab specimens and should not be used as a sole basis for treatment.  Nasal washings and aspirates are unacceptable for Xpert Xpress SARS-CoV-2/FLU/RSV testing.  Fact Sheet for Patients: BloggerCourse.com  Fact Sheet for Healthcare Providers: SeriousBroker.it  This test is not yet approved or cleared by the [place] FDA and has been authorized for detection and/or diagnosis of SARS-CoV-2 by FDA under an Emergency Use Authorization (EUA). This EUA will remain in effect (meaning this test can be used) for the duration of the COVID-19 declaration under Section 564(b)(1) of the Act, 21 U.S.C. section 360bbb-3(b)(1), unless the authorization is terminated or revoked.  Performed at [facility], [address], [place], [place] [address]   . SARS Coronavirus 2 Ag 04/30/2021 Negative  Negative Preliminary  . WBC 04/30/2021 4.1  4.0 - 10.5 K/uL Final  . RBC 04/30/2021 5.04  3.87 - 5.11 MIL/uL Final  . Hemoglobin 04/30/2021 15.0  12.0 - 15.0 g/dL Final  . HCT 60/45/4098 47.0* 36.0 - 46.0 % Final  . MCV 04/30/2021 93.3  80.0 - 100.0 fL Final  . MCH 04/30/2021 29.8  26.0 - 34.0 pg Final  . MCHC 04/30/2021 31.9  30.0 - 36.0 g/dL Final  . RDW 11/91/4782 14.7  11.5 - 15.5 % Final  . Platelets 04/30/2021 257  150 - 400 K/uL Final  . nRBC 04/30/2021 0.0  0.0 - 0.2 % Final  . Neutrophils Relative % 04/30/2021 43  % Final  . Neutro Abs 04/30/2021 1.8  1.7 - 7.7 K/uL Final  . Lymphocytes Relative 04/30/2021 42  % Final  . Lymphs Abs 04/30/2021 1.7  0.7 - 4.0 K/uL Final  . Monocytes Relative 04/30/2021 9  % Final  . Monocytes Absolute 04/30/2021 0.4  0.1 - 1.0 K/uL Final  . Eosinophils Relative 04/30/2021 4  % Final  . Eosinophils Absolute 04/30/2021 0.2  0.0 - 0.5 K/uL Final  .  Basophils Relative 04/30/2021 1  % Final  . Basophils Absolute 04/30/2021 0.0  0.0 - 0.1 K/uL Final  . Immature Granulocytes 04/30/2021 1  % Final  . Abs Immature Granulocytes 04/30/2021 0.03  0.00 - 0.07 K/uL Final   Performed at [facility], [address] [place], [place] [address]  . Sodium 04/30/2021 140  135 - 145 mmol/L Final  . Potassium 04/30/2021 3.8  3.5 - 5.1 mmol/L Final  . Chloride 04/30/2021 105  98 - 111 mmol/L Final  . CO2 04/30/2021 28  22 - 32 mmol/L Final  . Glucose, Bld 04/30/2021 102* 70 - 99 mg/dL Final   Glucose reference range applies only to samples taken after fasting for at least 8 hours.  . BUN 04/30/2021 17  6 - 20 mg/dL Final  . Creatinine, Ser 04/30/2021 0.86  0.44 - 1.00 mg/dL Final  . Calcium 30/86/5784 8.8* 8.9 - 10.3 mg/dL Final  . Total Protein 04/30/2021 5.9* 6.5 - 8.1 g/dL Final  . Albumin 69/62/9528 3.3* 3.5 - 5.0 g/dL Final  . AST 41/32/4401 31  15 - 41 U/L Final  . ALT 04/30/2021 24  0 - 44 U/L Final  . Alkaline Phosphatase 04/30/2021 63  38 - 126 U/L Final  . Total Bilirubin 04/30/2021 0.4  0.3 - 1.2 mg/dL Final  . GFR, Estimated 04/30/2021 >60  >60 mL/min Final   Comment: (NOTE) Calculated using the CKD-EPI Creatinine Equation (2021)   . Anion gap 04/30/2021 7  5 - 15 Final   Performed at [facility], [address], [place], [place] [address]  . Hgb A1c MFr Bld 04/30/2021 5.5  4.8 - 5.6 % Final   Comment: (NOTE) Pre diabetes:          5.7%-6.4%  Diabetes:              >6.4%  Glycemic control for   <7.0% adults with diabetes   . Mean Plasma Glucose 04/30/2021 111.15  mg/dL Final   Performed at [facility], [address] [place], [place] [address]  . TSH 04/30/2021 1.885  0.350 - 4.500 uIU/mL Final   Comment: Performed by a 3rd Generation assay with a functional sensitivity of <=0.01 uIU/mL. Performed at [facility], [address] [place], [place] [address]   . SARSCOV2ONAVIRUS 2 AG 04/30/2021  NEGATIVE  NEGATIVE Final   Comment: (NOTE) SARS-CoV-2 antigen NOT DETECTED.   Negative results are presumptive.  Negative results do not preclude SARS-CoV-2 infection and should not be used as the sole basis for treatment or other patient management decisions, including infection  control decisions, particularly in the presence of clinical signs and  symptoms consistent with COVID-19, or in those who have been in contact with the virus.  Negative results must be combined with clinical observations, patient history, and epidemiological information. The expected result is Negative.  Fact Sheet for Patients: https://www.jennings-kim.com/  Fact Sheet for Healthcare Providers: https://alexander-rogers.biz/  This test is not yet approved or cleared by the [place] FDA and  has been authorized for detection and/or diagnosis of SARS-CoV-2 by FDA under an Emergency Use Authorization (EUA).  This EUA will remain in effect (meaning this test can be used) for the duration of  the COV                          ID-19 declaration under Section 564(b)(1) of the Act, 21 U.S.C. section 360bbb-3(b)(1), unless the authorization is terminated or revoked sooner.    . Cholesterol 04/30/2021 165  0 - 200 mg/dL Final  . Triglycerides 04/30/2021 110  <150 mg/dL Final  . HDL 56/21/3086 92  >40 mg/dL Final  . Total CHOL/HDL Ratio 04/30/2021 1.8  RATIO Final  . VLDL 04/30/2021 22  0 - 40 mg/dL Final  . LDL Cholesterol 04/30/2021 51  0 - 99 mg/dL Final   Comment:        Total Cholesterol/HDL:CHD Risk Coronary Heart Disease Risk Table                     Men   Women  1/2 Average Risk   3.4   3.3  Average Risk       5.0   4.4  2 X Average Risk   9.6   7.1  3 X Average Risk  23.4   11.0        Use the calculated Patient Ratio above and the CHD Risk Table to determine the patient's CHD Risk.        ATP III CLASSIFICATION (LDL):  <100     mg/dL   Optimal  578-469  mg/dL    Near or Above                    Optimal  130-159  mg/dL   Borderline  629-528  mg/dL   High  >413     mg/dL   Very High Performed at [facility], [address] [place], [place] [address]   Admission on 04/09/2021, Discharged on 04/09/2021  Component Date Value Ref Range Status  .  I-stat hCG, quantitative 04/09/2021 <5.0  <5 mIU/mL Final  . Comment 3 04/09/2021          Final   Comment:   GEST. AGE      CONC.  (mIU/mL)   <=1 WEEK        5 - 50     2 WEEKS       50 - 500     3 WEEKS       100 - 10,000     4 WEEKS     1,000 - 30,000        FEMALE AND NON-PREGNANT FEMALE:     LESS THAN 5 mIU/mL   . Troponin I (High Sensitivity) 04/09/2021 5  <18 ng/L Final   Comment: (NOTE) Elevated high sensitivity troponin I (hsTnI) values and significant  changes across serial measurements may suggest ACS but many other  chronic and acute conditions are known to elevate hsTnI results.  Refer to the "Links" section for chest pain algorithms and additional  guidance. Performed at [facility], [address], [place], [place] [address]   . WBC 04/09/2021 4.2  4.0 - 10.5 K/uL Final  . RBC 04/09/2021 5.27* 3.87 - 5.11 MIL/uL Final  . Hemoglobin 04/09/2021 15.7* 12.0 - 15.0 g/dL Final  . HCT 84/66/5993 48.9* 36.0 - 46.0 % Final  . MCV 04/09/2021 92.8  80.0 - 100.0 fL Final  . MCH 04/09/2021 29.8  26.0 - 34.0 pg Final  . MCHC 04/09/2021 32.1  30.0 - 36.0 g/dL Final  . RDW 57/12/7791 14.8  11.5 - 15.5 % Final  . Platelets 04/09/2021 214  150 - 400 K/uL Final  . nRBC 04/09/2021 0.0  0.0 - 0.2 % Final  . Neutrophils Relative % 04/09/2021 49  % Final  . Neutro Abs 04/09/2021 2.1  1.7 - 7.7 K/uL Final  . Lymphocytes Relative 04/09/2021 35  % Final  . Lymphs Abs 04/09/2021 1.5  0.7 - 4.0 K/uL Final  . Monocytes Relative 04/09/2021 10  % Final  . Monocytes Absolute 04/09/2021 0.4  0.1 - 1.0 K/uL Final  . Eosinophils Relative 04/09/2021 4  % Final  . Eosinophils Absolute 04/09/2021 0.2   0.0 - 0.5 K/uL Final  . Basophils Relative 04/09/2021 1  % Final  . Basophils Absolute 04/09/2021 0.0  0.0 - 0.1 K/uL Final  . Immature Granulocytes 04/09/2021 1  % Final  . Abs Immature Granulocytes 04/09/2021 0.02  0.00 - 0.07 K/uL Final   Performed at [facility], [address], [place], [place] [address]  . Sodium 04/09/2021 140  135 - 145 mmol/L Final  . Potassium 04/09/2021 3.7  3.5 - 5.1 mmol/L Final  . Chloride 04/09/2021 104  98 - 111 mmol/L Final  . CO2 04/09/2021 25  22 - 32 mmol/L Final  . Glucose, Bld 04/09/2021 64* 70 - 99 mg/dL Final   Glucose reference range applies only to samples taken after fasting for at least 8 hours.  . BUN 04/09/2021 11  6 - 20 mg/dL Final  . Creatinine, Ser 04/09/2021 0.86  0.44 - 1.00 mg/dL Final  . Calcium 92/33/0076 9.3  8.9 - 10.3 mg/dL Final  . GFR, Estimated 04/09/2021 >60  >60 mL/min Final   Comment: (NOTE) Calculated using the CKD-EPI Creatinine Equation (2021)   . Anion gap 04/09/2021 11  5 - 15 Final   Performed at [facility], [address], [place], [place] [address]  . Troponin I (  High Sensitivity) 04/09/2021 6  <18 ng/L Final   Comment: (NOTE) Elevated high sensitivity troponin I (hsTnI) values and significant  changes across serial measurements may suggest ACS but many other  chronic and acute conditions are known to elevate hsTnI results.  Refer to the "Links" section for chest pain algorithms and additional  guidance. Performed at [facility], [address] [place], [place] [address]     Allergies: Patient has no known allergies.  PTA Medications: (Not in a hospital admission)   Medical Decision Making  Patient is a 60 year old female with history of schizoaffective disorder bipolar type and cocaine use disorder who presents to the behavioral health urgent care as a voluntary direct admit transfer from [facility] for SI with a plan.  Based on patient's history, patient's presentation, TTS assessment,  [facility] provider assessment, and my assessment, believe that the patient is a potential threat to herself at this time and recommend continuous observation/assessment for the patient.    Recommendations  Based on my evaluation the patient does not appear to have an emergency medical condition.  Patient will be placed in behavioral health urgent care continuous observation/assessment for further stabilization and treatment.  Patient will be reevaluated by the treatment team on 04/30/2021 and disposition to be determined at that time. Labs ordered and reviewed:  -PCR COVID: Negative  -CBC with differential: Unremarkable  -CMP: Unremarkable  -Hemoglobin A1c, TSH, and lipid panel ordered due to patient's history of taking antipsychotic medication.  Hemoglobin A1c, TSH, and lipid panel all within normal limits.  -UDS and urine pregnancy: Patient did not provide urine specimen.  Recommend that nursing attempt to obtain urine specimen to run these labs during the day on 04/30/2021. EKG ordered to assess patient's QTC due to patient's history of taking antipsychotic medication.  Nursing unable to obtain EKG on the patient at this time.  Recommend that nursing attempt to obtain EKG on the patient during the day on 04/30/2021 if possible.  Patient's ED 04/09/2021 EKG reviewed which shows QTC of 438 (appropriate QTC to continue antipsychotic medication).   Due to patient's psychotropic medication noncompliance, will not restart patient's home dose of Seroquel at this time.  Will restart Seroquel 50 mg p.o. daily at bedtime for schizoaffective disorder/psychosis. Vistaril 25 mg p.o. 3 times daily as needed ordered for anxiety  Jaclyn Shaggy, PA-C 04/30/21  6:27 AM

## 2021-04-30 DIAGNOSIS — F250 Schizoaffective disorder, bipolar type: Secondary | ICD-10-CM | POA: Diagnosis not present

## 2021-04-30 LAB — LIPID PANEL
Cholesterol: 165 mg/dL (ref 0–200)
HDL: 92 mg/dL (ref >40)
LDL Cholesterol: 51 mg/dL (ref 0–99)
Total CHOL/HDL Ratio: 1.8 RATIO
Triglycerides: 110 mg/dL (ref <150)
VLDL: 22 mg/dL (ref 0–40)

## 2021-04-30 LAB — COMPREHENSIVE METABOLIC PANEL
ALT: 24 U/L (ref 0–44)
AST: 31 U/L (ref 15–41)
Albumin: 3.3 g/dL — ABNORMAL LOW (ref 3.5–5.0)
Alkaline Phosphatase: 63 U/L (ref 38–126)
Anion gap: 7 (ref 5–15)
BUN: 17 mg/dL (ref 6–20)
CO2: 28 mmol/L (ref 22–32)
Calcium: 8.8 mg/dL — ABNORMAL LOW (ref 8.9–10.3)
Chloride: 105 mmol/L (ref 98–111)
Creatinine, Ser: 0.86 mg/dL (ref 0.44–1.00)
GFR, Estimated: >60 mL/min (ref >60)
Glucose, Bld: 102 mg/dL — ABNORMAL HIGH (ref 70–99)
Potassium: 3.8 mmol/L (ref 3.5–5.1)
Sodium: 140 mmol/L (ref 135–145)
Total Bilirubin: 0.4 mg/dL (ref 0.3–1.2)
Total Protein: 5.9 g/dL — ABNORMAL LOW (ref 6.5–8.1)

## 2021-04-30 LAB — CBC WITH DIFFERENTIAL/PLATELET
Abs Immature Granulocytes: 0.03 10*3/uL (ref 0.00–0.07)
Basophils Absolute: 0 10*3/uL (ref 0.0–0.1)
Basophils Relative: 1 %
Eosinophils Absolute: 0.2 10*3/uL (ref 0.0–0.5)
Eosinophils Relative: 4 %
HCT: 47 % — ABNORMAL HIGH (ref 36.0–46.0)
Hemoglobin: 15 g/dL (ref 12.0–15.0)
Immature Granulocytes: 1 %
Lymphocytes Relative: 42 %
Lymphs Abs: 1.7 10*3/uL (ref 0.7–4.0)
MCH: 29.8 pg (ref 26.0–34.0)
MCHC: 31.9 g/dL (ref 30.0–36.0)
MCV: 93.3 fL (ref 80.0–100.0)
Monocytes Absolute: 0.4 10*3/uL (ref 0.1–1.0)
Monocytes Relative: 9 %
Neutro Abs: 1.8 10*3/uL (ref 1.7–7.7)
Neutrophils Relative %: 43 %
Platelets: 257 10*3/uL (ref 150–400)
RBC: 5.04 MIL/uL (ref 3.87–5.11)
RDW: 14.7 % (ref 11.5–15.5)
WBC: 4.1 10*3/uL (ref 4.0–10.5)
nRBC: 0 % (ref 0.0–0.2)

## 2021-04-30 LAB — HEMOGLOBIN A1C
Hgb A1c MFr Bld: 5.5 % (ref 4.8–5.6)
Mean Plasma Glucose: 111.15 mg/dL

## 2021-04-30 LAB — RESP PANEL BY RT-PCR (FLU A&B, COVID) ARPGX2
Influenza A by PCR: NEGATIVE
Influenza B by PCR: NEGATIVE
SARS Coronavirus 2 by RT PCR: NEGATIVE

## 2021-04-30 LAB — POC SARS CORONAVIRUS 2 AG: SARSCOV2ONAVIRUS 2 AG: NEGATIVE

## 2021-04-30 LAB — POC SARS CORONAVIRUS 2 AG -  ED: SARS Coronavirus 2 Ag: NEGATIVE

## 2021-04-30 LAB — TSH: TSH: 1.885 u[IU]/mL (ref 0.350–4.500)

## 2021-04-30 MED ORDER — QUETIAPINE FUMARATE 100 MG PO TABS
100.0000 mg | ORAL_TABLET | Freq: Every day | ORAL | Status: DC
  Administered 2021-04-30 – 2021-05-01 (×2): 100 mg via ORAL
  Filled 2021-04-30 (×2): qty 1

## 2021-04-30 MED ORDER — ASPIRIN 325 MG PO TABS
325.0000 mg | ORAL_TABLET | Freq: Four times a day (QID) | ORAL | Status: DC | PRN
  Administered 2021-04-30 – 2021-05-01 (×2): 325 mg via ORAL
  Filled 2021-04-30 (×2): qty 1

## 2021-04-30 NOTE — ED Notes (Signed)
Pt up to nurse's station requesting Aspirin, stating she has a headache. Pt reports pain 9/10. PRN Aspirin and meal given. Pt A&O x4, calm and cooperative. No signs of acute distress noted. Will continue to monitor for safety.

## 2021-04-30 NOTE — ED Provider Notes (Signed)
Behavioral Health Progress Note  Date and Time: 04/30/2021 12:44 PM Name: [name] MRN:  [id number]  Subjective:  [name] reported " I need help, I know that  I will kill myself if I leave."  Evaluation: [name] has a chart history with schizoaffective, bipolar type, hallucination and substance use. Stated she was unable to provided a urin sample at the time of admission. Patient was restarted on home medications, will make adjustments. CSW to seek inpatient admission placement.   Per admission assessment note: Patient states that she went to [facility] because she had been "off meds for a while" and was experiencing suicidal ideation with a plan to cut herself with a razor blade or overdose on pills.  Patient continues to endorse active suicidal ideation on exam with the same plan of cutting herself with a razor blade or overdosing on pills mentioned above.  Patient endorses 2 past suicide attempt in which she states that she slit her wrist with a razor blade 5 to 6 years ago, as well as overdosed on pills 5 to 6 years ago.  Patient denies HI.  She endorses having intermittent auditory and visual hallucinations for greater than 6 months but she does state that over the past week she has experienced intermittent visual hallucinations of "seeing shadows" and "seeing people in my food".  Patient states that she last experienced these visual hallucinations on the afternoon of 04/29/2021.  Patient reports that she was living in [place] and had an ACT team through [place].  However, she states that she moved to [facility] in January/February 2022 and since she moved to [place] she has not been able to continue with an ACT team in [place].   Diagnosis:  Final diagnoses:  Schizoaffective disorder, bipolar type (HCC)  Cocaine use disorder, severe, dependence (HCC)    Total Time spent with patient: 15 minutes  Past Psychiatric History:  Past Medical History:  Past Medical History:  Diagnosis  Date  . Anxiety   . Bipolar 1 disorder (HCC)   . Depression   . Drug abuse (HCC)   . Headache   . Schizophrenia [facility])     Past Surgical History:  Procedure Laterality Date  . NO PAST SURGERIES     Family History:  Family History  Problem Relation Age of Onset  . Mental illness Maternal Aunt   . Cancer Maternal Aunt        breast   . Mental illness Paternal Aunt   . Anesthesia problems Neg Hx   . Hypotension Neg Hx   . Malignant hyperthermia Neg Hx   . Pseudochol deficiency Neg Hx    Family Psychiatric  History:  Social History:  Social History   Substance and Sexual Activity  Alcohol Use Yes  . Alcohol/week: 3.0 standard drinks  . Types: 3 Cans of beer per week   Comment: 8-10 can daily     Social History   Substance and Sexual Activity  Drug Use Yes  . Types: Marijuana, Cocaine   Comment: occasionally    Social History   Socioeconomic History  . Marital status: Single    Spouse name: Not on file  . Number of children: Not on file  . Years of education: Not on file  . Highest education level: Not on file  Occupational History  . Not on file  Tobacco Use  . Smoking status: Current Every Day Smoker    Packs/day: 0.25    Years: 10.00    Pack years: 2.50  Types: Cigarettes, Cigars  . Smokeless tobacco: Never Used  Vaping Use  . Vaping Use: Never used  Substance and Sexual Activity  . Alcohol use: Yes    Alcohol/week: 3.0 standard drinks    Types: 3 Cans of beer per week    Comment: 8-10 can daily  . Drug use: Yes    Types: Marijuana, Cocaine    Comment: occasionally  . Sexual activity: Not Currently  Other Topics Concern  . Not on file  Social History Narrative  . Not on file   Social Determinants of Health   Financial Resource Strain: Not on file  Food Insecurity: Not on file  Transportation Needs: Not on file  Physical Activity: Not on file  Stress: Not on file  Social Connections: Not on file   SDOH:  SDOH Screenings   Alcohol  Screen: Not on file  Depression (ZOX0-9): Not on file  Financial Resource Strain: Not on file  Food Insecurity: Not on file  Housing: Not on file  Physical Activity: Not on file  Social Connections: Not on file  Stress: Not on file  Tobacco Use: High Risk  . Smoking Tobacco Use: Current Every Day Smoker  . Smokeless Tobacco Use: Never Used  Transportation Needs: Not on file   Additional Social History:                         Sleep: Good  Appetite:  Good  Current Medications:  Current Facility-Administered Medications  Medication Dose Route Frequency Provider Last Rate Last Admin  . acetaminophen (TYLENOL) tablet 650 mg  650 mg Oral Q6H PRN Jaclyn Shaggy, PA-C      . alum & mag hydroxide-simeth (MAALOX/MYLANTA) 200-200-20 MG/5ML suspension 30 mL  30 mL Oral Q4H PRN Melbourne Abts W, PA-C      . aspirin tablet 325 mg  325 mg Oral Q6H PRN Oneta Rack, NP      . hydrOXYzine (ATARAX/VISTARIL) tablet 25 mg  25 mg Oral TID PRN Jaclyn Shaggy, PA-C      . magnesium hydroxide (MILK OF MAGNESIA) suspension 30 mL  30 mL Oral Daily PRN Melbourne Abts W, PA-C      . QUEtiapine (SEROQUEL) tablet 50 mg  50 mg Oral QHS Melbourne Abts W, PA-C   50 mg at 04/30/21 6045   Current Outpatient Medications  Medication Sig Dispense Refill  . Ensure (ENSURE) Take 237 mLs by mouth daily as needed.    . Multiple Vitamins-Minerals (ONE-A-DAY WOMENS 50+) TABS Take 1 tablet by mouth daily.    . QUEtiapine (SEROQUEL) 200 MG tablet Take 200 mg by mouth at bedtime.      Labs  Lab Results:  Admission on 04/29/2021  Component Date Value Ref Range Status  . SARS Coronavirus 2 by RT PCR 04/30/2021 NEGATIVE  NEGATIVE Final   Comment: (NOTE) SARS-CoV-2 target nucleic acids are NOT DETECTED.  The SARS-CoV-2 RNA is generally detectable in upper respiratory specimens during the acute phase of infection. The lowest concentration of SARS-CoV-2 viral copies this assay can detect is 138 copies/mL. A  negative result does not preclude SARS-Cov-2 infection and should not be used as the sole basis for treatment or other patient management decisions. A negative result may occur with  improper specimen collection/handling, submission of specimen other than nasopharyngeal swab, presence of viral mutation(s) within the areas targeted by this assay, and inadequate number of viral copies(<138 copies/mL). A negative result must be combined with  clinical observations, patient history, and epidemiological information. The expected result is Negative.  Fact Sheet for Patients:  BloggerCourse.com  Fact Sheet for Healthcare Providers:  SeriousBroker.it  This test is no                          t yet approved or cleared by the [place] FDA and  has been authorized for detection and/or diagnosis of SARS-CoV-2 by FDA under an Emergency Use Authorization (EUA). This EUA will remain  in effect (meaning this test can be used) for the duration of the COVID-19 declaration under Section 564(b)(1) of the Act, 21 U.S.C.section 360bbb-3(b)(1), unless the authorization is terminated  or revoked sooner.      . Influenza A by PCR 04/30/2021 NEGATIVE  NEGATIVE Final  . Influenza B by PCR 04/30/2021 NEGATIVE  NEGATIVE Final   Comment: (NOTE) The Xpert Xpress SARS-CoV-2/FLU/RSV plus assay is intended as an aid in the diagnosis of influenza from Nasopharyngeal swab specimens and should not be used as a sole basis for treatment. Nasal washings and aspirates are unacceptable for Xpert Xpress SARS-CoV-2/FLU/RSV testing.  Fact Sheet for Patients: BloggerCourse.com  Fact Sheet for Healthcare Providers: SeriousBroker.it  This test is not yet approved or cleared by the [place] FDA and has been authorized for detection and/or diagnosis of SARS-CoV-2 by FDA under an Emergency Use Authorization (EUA).  This EUA will remain in effect (meaning this test can be used) for the duration of the COVID-19 declaration under Section 564(b)(1) of the Act, 21 U.S.C. section 360bbb-3(b)(1), unless the authorization is terminated or revoked.  Performed at [facility], [address] [place], [place] [address]   . SARS Coronavirus 2 Ag 04/30/2021 Negative  Negative Preliminary  . WBC 04/30/2021 4.1  4.0 - 10.5 K/uL Final  . RBC 04/30/2021 5.04  3.87 - 5.11 MIL/uL Final  . Hemoglobin 04/30/2021 15.0  12.0 - 15.0 g/dL Final  . HCT 24/08/7352 47.0* 36.0 - 46.0 % Final  . MCV 04/30/2021 93.3  80.0 - 100.0 fL Final  . MCH 04/30/2021 29.8  26.0 - 34.0 pg Final  . MCHC 04/30/2021 31.9  30.0 - 36.0 g/dL Final  . RDW 29/92/4268 14.7  11.5 - 15.5 % Final  . Platelets 04/30/2021 257  150 - 400 K/uL Final  . nRBC 04/30/2021 0.0  0.0 - 0.2 % Final  . Neutrophils Relative % 04/30/2021 43  % Final  . Neutro Abs 04/30/2021 1.8  1.7 - 7.7 K/uL Final  . Lymphocytes Relative 04/30/2021 42  % Final  . Lymphs Abs 04/30/2021 1.7  0.7 - 4.0 K/uL Final  . Monocytes Relative 04/30/2021 9  % Final  . Monocytes Absolute 04/30/2021 0.4  0.1 - 1.0 K/uL Final  . Eosinophils Relative 04/30/2021 4  % Final  . Eosinophils Absolute 04/30/2021 0.2  0.0 - 0.5 K/uL Final  . Basophils Relative 04/30/2021 1  % Final  . Basophils Absolute 04/30/2021 0.0  0.0 - 0.1 K/uL Final  . Immature Granulocytes 04/30/2021 1  % Final  . Abs Immature Granulocytes 04/30/2021 0.03  0.00 - 0.07 K/uL Final   Performed at [facility], [address] [place], [place] [address]  . Sodium 04/30/2021 140  135 - 145 mmol/L Final  . Potassium 04/30/2021 3.8  3.5 - 5.1 mmol/L Final  . Chloride 04/30/2021 105  98 - 111 mmol/L Final  . CO2 04/30/2021 28  22 - 32 mmol/L Final  .  Glucose, Bld 04/30/2021 102* 70 - 99 mg/dL Final   Glucose reference range applies only to samples taken after fasting for at least 8 hours.  . BUN 04/30/2021 17  6 -  20 mg/dL Final  . Creatinine, Ser 04/30/2021 0.86  0.44 - 1.00 mg/dL Final  . Calcium 34/19/3790 8.8* 8.9 - 10.3 mg/dL Final  . Total Protein 04/30/2021 5.9* 6.5 - 8.1 g/dL Final  . Albumin 24/08/7352 3.3* 3.5 - 5.0 g/dL Final  . AST 29/92/4268 31  15 - 41 U/L Final  . ALT 04/30/2021 24  0 - 44 U/L Final  . Alkaline Phosphatase 04/30/2021 63  38 - 126 U/L Final  . Total Bilirubin 04/30/2021 0.4  0.3 - 1.2 mg/dL Final  . GFR, Estimated 04/30/2021 >60  >60 mL/min Final   Comment: (NOTE) Calculated using the CKD-EPI Creatinine Equation (2021)   . Anion gap 04/30/2021 7  5 - 15 Final   Performed at [facility], [address] [place], [place] [address]  . Hgb A1c MFr Bld 04/30/2021 5.5  4.8 - 5.6 % Final   Comment: (NOTE) Pre diabetes:          5.7%-6.4%  Diabetes:              >6.4%  Glycemic control for   <7.0% adults with diabetes   . Mean Plasma Glucose 04/30/2021 111.15  mg/dL Final   Performed at [facility], [address] [place], [place] [address]  . TSH 04/30/2021 1.885  0.350 - 4.500 uIU/mL Final   Comment: Performed by a 3rd Generation assay with a functional sensitivity of <=0.01 uIU/mL. Performed at [facility], [address] [place], [place] [address]   . SARSCOV2ONAVIRUS 2 AG 04/30/2021 NEGATIVE  NEGATIVE Final   Comment: (NOTE) SARS-CoV-2 antigen NOT DETECTED.   Negative results are presumptive.  Negative results do not preclude SARS-CoV-2 infection and should not be used as the sole basis for treatment or other patient management decisions, including infection  control decisions, particularly in the presence of clinical signs and  symptoms consistent with COVID-19, or in those who have been in contact with the virus.  Negative results must be combined with clinical observations, patient history, and epidemiological information. The expected result is Negative.  Fact Sheet for Patients: https://www.jennings-kim.com/  Fact  Sheet for Healthcare Providers: https://alexander-rogers.biz/  This test is not yet approved or cleared by the [place] FDA and  has been authorized for detection and/or diagnosis of SARS-CoV-2 by FDA under an Emergency Use Authorization (EUA).  This EUA will remain in effect (meaning this test can be used) for the duration of  the COV                          ID-19 declaration under Section 564(b)(1) of the Act, 21 U.S.C. section 360bbb-3(b)(1), unless the authorization is terminated or revoked sooner.    . Cholesterol 04/30/2021 165  0 - 200 mg/dL Final  . Triglycerides 04/30/2021 110  <150 mg/dL Final  . HDL 19/41/7408 92  >40 mg/dL Final  . Total CHOL/HDL Ratio 04/30/2021 1.8  RATIO Final  . VLDL 04/30/2021 22  0 - 40 mg/dL Final  . LDL Cholesterol 04/30/2021 51  0 - 99 mg/dL Final   Comment:        Total Cholesterol/HDL:CHD Risk Coronary Heart Disease Risk Table  Men   Women  1/2 Average Risk   3.4   3.3  Average Risk       5.0   4.4  2 X Average Risk   9.6   7.1  3 X Average Risk  23.4   11.0        Use the calculated Patient Ratio above and the CHD Risk Table to determine the patient's CHD Risk.        ATP III CLASSIFICATION (LDL):  <100     mg/dL   Optimal  962-952  mg/dL   Near or Above                    Optimal  130-159  mg/dL   Borderline  841-324  mg/dL   High  >401     mg/dL   Very High Performed at [facility], [address], [place], [place] [address]   Admission on 04/09/2021, Discharged on 04/09/2021  Component Date Value Ref Range Status  . I-stat hCG, quantitative 04/09/2021 <5.0  <5 mIU/mL Final  . Comment 3 04/09/2021          Final   Comment:   GEST. AGE      CONC.  (mIU/mL)   <=1 WEEK        5 - 50     2 WEEKS       50 - 500     3 WEEKS       100 - 10,000     4 WEEKS     1,000 - 30,000        FEMALE AND NON-PREGNANT FEMALE:     LESS THAN 5 mIU/mL   . Troponin I (High Sensitivity) 04/09/2021 5  <18  ng/L Final   Comment: (NOTE) Elevated high sensitivity troponin I (hsTnI) values and significant  changes across serial measurements may suggest ACS but many other  chronic and acute conditions are known to elevate hsTnI results.  Refer to the "Links" section for chest pain algorithms and additional  guidance. Performed at [facility], [address], [place], [place] [address]   . WBC 04/09/2021 4.2  4.0 - 10.5 K/uL Final  . RBC 04/09/2021 5.27* 3.87 - 5.11 MIL/uL Final  . Hemoglobin 04/09/2021 15.7* 12.0 - 15.0 g/dL Final  . HCT 03/47/4259 48.9* 36.0 - 46.0 % Final  . MCV 04/09/2021 92.8  80.0 - 100.0 fL Final  . MCH 04/09/2021 29.8  26.0 - 34.0 pg Final  . MCHC 04/09/2021 32.1  30.0 - 36.0 g/dL Final  . RDW 56/38/7564 14.8  11.5 - 15.5 % Final  . Platelets 04/09/2021 214  150 - 400 K/uL Final  . nRBC 04/09/2021 0.0  0.0 - 0.2 % Final  . Neutrophils Relative % 04/09/2021 49  % Final  . Neutro Abs 04/09/2021 2.1  1.7 - 7.7 K/uL Final  . Lymphocytes Relative 04/09/2021 35  % Final  . Lymphs Abs 04/09/2021 1.5  0.7 - 4.0 K/uL Final  . Monocytes Relative 04/09/2021 10  % Final  . Monocytes Absolute 04/09/2021 0.4  0.1 - 1.0 K/uL Final  . Eosinophils Relative 04/09/2021 4  % Final  . Eosinophils Absolute 04/09/2021 0.2  0.0 - 0.5 K/uL Final  . Basophils Relative 04/09/2021 1  % Final  . Basophils Absolute 04/09/2021 0.0  0.0 - 0.1 K/uL Final  . Immature Granulocytes 04/09/2021 1  % Final  . Abs Immature Granulocytes 04/09/2021 0.02  0.00 -  0.07 K/uL Final   Performed at [facility], [address], [place], [place] [address]  . Sodium 04/09/2021 140  135 - 145 mmol/L Final  . Potassium 04/09/2021 3.7  3.5 - 5.1 mmol/L Final  . Chloride 04/09/2021 104  98 - 111 mmol/L Final  . CO2 04/09/2021 25  22 - 32 mmol/L Final  . Glucose, Bld 04/09/2021 64* 70 - 99 mg/dL Final   Glucose reference range applies only to samples taken after fasting for at least 8 hours.  . BUN  04/09/2021 11  6 - 20 mg/dL Final  . Creatinine, Ser 04/09/2021 0.86  0.44 - 1.00 mg/dL Final  . Calcium 42/68/3419 9.3  8.9 - 10.3 mg/dL Final  . GFR, Estimated 04/09/2021 >60  >60 mL/min Final   Comment: (NOTE) Calculated using the CKD-EPI Creatinine Equation (2021)   . Anion gap 04/09/2021 11  5 - 15 Final   Performed at [facility], [address], [place], [place] [address]  . Troponin I (High Sensitivity) 04/09/2021 6  <18 ng/L Final   Comment: (NOTE) Elevated high sensitivity troponin I (hsTnI) values and significant  changes across serial measurements may suggest ACS but many other  chronic and acute conditions are known to elevate hsTnI results.  Refer to the "Links" section for chest pain algorithms and additional  guidance. Performed at [facility], [address] [place], [place] [address]     Blood Alcohol level:  Lab Results  Component Value Date   ETH <10 04/30/2020   ETH <10 10/03/2019    Metabolic Disorder Labs: Lab Results  Component Value Date   HGBA1C 5.5 04/30/2021   MPG 111.15 04/30/2021   MPG 119.76 04/30/2020   Lab Results  Component Value Date   PROLACTIN 8.9 04/07/2018   PROLACTIN 14.3 09/23/2015   Lab Results  Component Value Date   CHOL 165 04/30/2021   TRIG 110 04/30/2021   HDL 92 04/30/2021   CHOLHDL 1.8 04/30/2021   VLDL 22 04/30/2021   LDLCALC 51 04/30/2021   LDLCALC 70 04/30/2020    Therapeutic Lab Levels: No results found for: LITHIUM Lab Results  Component Value Date   VALPROATE 117.9 (H) 05/27/2009   VALPROATE 116.9 (H) 05/26/2009   No components found for:  CBMZ  Physical Findings   AIMS   Flowsheet Row Admission (Discharged) from [facility]30[facility]2020 [facility] Admission (Discharged) from OP Visit from 10/03/2019 [facility] Admission (Discharged) from OP [facility] Admission (Discharged) from 04/02/2018  [facility] Admission (Discharged) from 05/17/2016 [facility]  AIMS Total Score 0 0 0 0 0    AUDIT   Flowsheet Row Admission (Discharged) from 04/30/2020 in [facility] Admission (Discharged) from OP Visit from 11/05[facility] Admission (Discharged) from 04/02/2018 [facility] Admission (Discharged) from 05/17/2016 [facility] Admission (Discharged) from 09/21/2015 [facility]  Alcohol Use Disorder Identification Test Final Score (AUDIT) 27 39 2 10 2     PHQ2-9   Flowsheet Row Office Visit from 05/31/2018 in [facility] Office Visit from 03/01/2017 in [facility] Office Visit from 02/15/2016 in [facility]  PHQ-2 Total Score 1 0 0    Flowsheet  [facility] Most recent reading at 04/30/2021  1:36 AM OP Visit from 04/29/2021 [facility] Most recent reading at 04/29/2021  7:47 PM [facility]20[facility] Most recent reading at 04/09/2021  6:21 AM  C-SSRS RISK CATEGORY Low Risk High Risk No Risk       Musculoskeletal  Strength & Muscle Tone: within normal limits Gait & Station: normal Patient leans: N/A  Psychiatric Specialty Exam  Presentation  General Appearance: Bizarre  Eye Contact:Fair; Fleeting  Speech:Clear and Coherent; Normal Rate  Speech Volume:Normal  Handedness:No data recorded  Mood and Affect  Mood:Anxious  Affect:Congruent   Thought Process  Thought Processes:Coherent; Goal Directed  Descriptions of Associations:Circumstantial  Orientation:Full (Time, Place and Person)  Thought Content:Paranoid Ideation; Scattered  Diagnosis of  Schizophrenia or Schizoaffective disorder in past: Yes  Duration of Psychotic Symptoms: Greater than six months   Hallucinations:Hallucinations: Visual Description of Visual Hallucinations: See HPI  Ideas of Reference:Paranoia  Suicidal Thoughts:Suicidal Thoughts: Yes, Active SI Active Intent and/or Plan: With Intent; With Plan; With Means to Carry Out; With Access to Means  Homicidal Thoughts:Homicidal Thoughts: No   Sensorium  Memory:Immediate Fair; Recent Fair; Remote Fair  Judgment:Fair  Insight:Fair   Executive Functions  Concentration:Fair  Attention Span:Fair  Recall:Fair  Fund of Knowledge:Fair  Language:Fair   Psychomotor Activity  Psychomotor Activity:Psychomotor Activity: Normal   Assets  Assets:Communication Skills; Desire for Improvement; Financial Resources/Insurance; Housing; Leisure Time; Physical Health   Sleep  Sleep:Sleep: Poor   Nutritional Assessment (For OBS and [facility] admissions only) Has the patient had a weight loss or gain of 10 pounds or more in the last 3 months?: Yes Has the patient had a decrease in food intake/or appetite?: No Does the patient have dental problems?: No Does the patient have eating habits or behaviors that may be indicators of an eating disorder including binging or inducing vomiting?: No Has the patient recently lost weight without trying?: Yes, 2-13 lbs. Has the patient been eating poorly because of a decreased appetite?: No Malnutrition Screening Tool Score: 1    Physical Exam  Physical Exam ROS Blood pressure 134/77, pulse 68, temperature 97.7 F (36.5 C), temperature source Temporal, resp. rate 16, SpO2 100 %. There is no height or weight on file to calculate BMI.  Treatment Plan Summary: Daily contact with patient to assess and evaluate symptoms and progress in treatment and Medication management   Increased Seroquel 50 mg to 100 mg nightly Need Urin sample  CSW to continue seeking inpatient  admission   Oneta Rackanika N Artice Holohan, NP 04/30/2021 12:44 PM

## 2021-04-30 NOTE — ED Notes (Signed)
Resting with eyes closed. Rise and fall of chest noted. No new issues noted at this time. Will continue to monitor for safety 

## 2021-04-30 NOTE — ED Notes (Signed)
GIVEN DINNER  °

## 2021-04-30 NOTE — Discharge Instructions (Addendum)
Take all medications as prescribed. Keep all follow-up appointments as scheduled.  Do not consume alcohol or use illegal drugs while on prescription medications. Report any adverse effects from your medications to your primary care provider promptly.  In the event of recurrent symptoms or worsening symptoms, call 911, a crisis hotline, or go to the nearest emergency department for evaluation.    Homeless Shelter List:   [facility]) [address] [place], [place] Phone: [phone]   [facility] has been providing emergency shelter to those in need of a permanent residence for over 35 years. The [facility] shelter plays an important role in our community.    There are many life events that can pull someone into a downward spiral towards poverty that is very difficult to get out of. Homelessness is a problem that can affect anyone of [place]. [facility] is a safe and comforting place to stay, especially if you have experienced the hardship of street life.    [facility] provides a single bed and bedding to 100 adult men and women. The shelter welcomes all who are in need of housing, no one in real need is turned away unless space is not available.    While staying at [facility], guests are offered more than just a bed for a night. Hot meals are provided and every guest has access to case management services. Case managers provide assistance with finding housing, employment, or other services that will help them gain stability. Continuous stay is based on availability, capacity, and progress towards goals.    To contact the front desk of [facility] please call   [phone] ext [phone] or ext. [phone].   [facility] [address] [place], [place] [address] Phone: [phone]   [facility] (Women only) [address] [place], [place] [address] Phone: [phone]   [facility] [address], [place] [address] Phone: [phone]   [facility]: [phone]. [address] [place], [place] [address] Phone: [phone]   [facility] [address], [place], [place] [address] Check in at 6:00PM for placement at a local shelter) Phone: [phone]   [facility] (Dom[facility])   Phone#: [phone]

## 2021-04-30 NOTE — ED Notes (Signed)
Pt A&O x 4, presents with suicidal ideation, hallucinations and cocaine abuse.  Pt calm & cooperative, monitoring for safety.  Skin search completed.

## 2021-05-01 DIAGNOSIS — F250 Schizoaffective disorder, bipolar type: Secondary | ICD-10-CM | POA: Diagnosis not present

## 2021-05-01 DIAGNOSIS — F1420 Cocaine dependence, uncomplicated: Secondary | ICD-10-CM

## 2021-05-01 NOTE — ED Notes (Signed)
Breakfast given.  

## 2021-05-01 NOTE — ED Notes (Signed)
Pt asleep in bed. Respirations even and unlabored. Will continue to monitor for safety. ?

## 2021-05-01 NOTE — ED Notes (Signed)
Pt resting in bed watching TV. A&O x4, calm and cooperative. Snack given. No signs of acute distress noted. Will continue to monitor for safety.

## 2021-05-01 NOTE — ED Notes (Signed)
Pt resting with eyes closed. Rise and fall of chest noted. No new issues at this time. Will continue to monitor for safety 

## 2021-05-01 NOTE — ED Provider Notes (Addendum)
Behavioral Health Progress Note  Date and Time: 05/01/2021 7:42 PM Name: [name] MRN:  [id number]  Subjective:  [name] reported " I am feeling better than yesterday."  Continues to endorse suicidal ideations denies plan or intent.  Medication was adjusted on 04/30/2021.  Patient is requesting outpatient resources for homeless shelters in [place].  CSW to follow-up for additional outpatient resources.  Reports her Medicaid information is for the [place] area.  Patient states she is hopeful to get back to [place] were she has friends and family.  Denied craving or withdrawal symptoms  for alcohol and/or cocaine during this assessment.    [name] denies auditory or visual hallucinations. Patient to make follow-up phone calls for bed availability.  Discussed possible discharge for 05/02/2021.  Patient was receptive to plan  Diagnosis:  Final diagnoses:  Schizoaffective disorder, bipolar type (HCC)  Cocaine use disorder, severe, dependence (HCC)    Total Time spent with patient: 15 minutes  Past Psychiatric History:  Past Medical History:  Past Medical History:  Diagnosis Date  . Anxiety   . Bipolar 1 disorder (HCC)   . Depression   . Drug abuse (HCC)   . Headache   . Schizophrenia [facility])     Past Surgical History:  Procedure Laterality Date  . NO PAST SURGERIES     Family History:  Family History  Problem Relation Age of Onset  . Mental illness Maternal Aunt   . Cancer Maternal Aunt        breast   . Mental illness Paternal Aunt   . Anesthesia problems Neg Hx   . Hypotension Neg Hx   . Malignant hyperthermia Neg Hx   . Pseudochol deficiency Neg Hx    Family Psychiatric  History:  Social History:  Social History   Substance and Sexual Activity  Alcohol Use Yes  . Alcohol/week: 3.0 standard drinks  . Types: 3 Cans of beer per week   Comment: 8-10 can daily     Social History   Substance and Sexual Activity  Drug Use Yes  . Types: Marijuana,  Cocaine   Comment: occasionally    Social History   Socioeconomic History  . Marital status: Single    Spouse name: Not on file  . Number of children: Not on file  . Years of education: Not on file  . Highest education level: Not on file  Occupational History  . Not on file  Tobacco Use  . Smoking status: Current Every Day Smoker    Packs/day: 0.25    Years: 10.00    Pack years: 2.50    Types: Cigarettes, Cigars  . Smokeless tobacco: Never Used  Vaping Use  . Vaping Use: Never used  Substance and Sexual Activity  . Alcohol use: Yes    Alcohol/week: 3.0 standard drinks    Types: 3 Cans of beer per week    Comment: 8-10 can daily  . Drug use: Yes    Types: Marijuana, Cocaine    Comment: occasionally  . Sexual activity: Not Currently  Other Topics Concern  . Not on file  Social History Narrative  . Not on file   Social Determinants of Health   Financial Resource Strain: Not on file  Food Insecurity: Not on file  Transportation Needs: Not on file  Physical Activity: Not on file  Stress: Not on file  Social Connections: Not on file   SDOH:  SDOH Screenings   Alcohol Screen: Not on file  Depression (WUJ8-1): Not on  Actuary Strain: Not on file  Food Insecurity: Not on file  Housing: Not on file  Physical Activity: Not on file  Social Connections: Not on file  Stress: Not on file  Tobacco Use: Not on file  Transportation Needs: Not on file   Additional Social History:                         Sleep: Fair  Appetite:  Fair  Current Medications:  Current Facility-Administered Medications  Medication Dose Route Frequency Provider Last Rate Last Admin  . acetaminophen (TYLENOL) tablet 650 mg  650 mg Oral Q6H PRN Jaclyn Shaggy, PA-C      . alum & mag hydroxide-simeth (MAALOX/MYLANTA) 200-200-20 MG/5ML suspension 30 mL  30 mL Oral Q4H PRN Melbourne Abts W, PA-C      . aspirin tablet 325 mg  325 mg Oral Q6H PRN Oneta Rack, NP   325  mg at 04/30/21 1952  . hydrOXYzine (ATARAX/VISTARIL) tablet 25 mg  25 mg Oral TID PRN Jaclyn Shaggy, PA-C      . magnesium hydroxide (MILK OF MAGNESIA) suspension 30 mL  30 mL Oral Daily PRN Ladona Ridgel, Cody W, PA-C      . QUEtiapine (SEROQUEL) tablet 100 mg  100 mg Oral QHS Oneta Rack, NP   100 mg at 04/30/21 2150   Current Outpatient Medications  Medication Sig Dispense Refill  . Ensure (ENSURE) Take 237 mLs by mouth daily as needed.    . Multiple Vitamins-Minerals (ONE-A-DAY WOMENS 50+) TABS Take 1 tablet by mouth daily.    . QUEtiapine (SEROQUEL) 200 MG tablet Take 200 mg by mouth at bedtime.      Labs  Lab Results:  Admission on 04/29/2021  Component Date Value Ref Range Status  . SARS Coronavirus 2 by RT PCR 04/30/2021 NEGATIVE  NEGATIVE Final   Comment: (NOTE) SARS-CoV-2 target nucleic acids are NOT DETECTED.  The SARS-CoV-2 RNA is generally detectable in upper respiratory specimens during the acute phase of infection. The lowest concentration of SARS-CoV-2 viral copies this assay can detect is 138 copies/mL. A negative result does not preclude SARS-Cov-2 infection and should not be used as the sole basis for treatment or other patient management decisions. A negative result may occur with  improper specimen collection/handling, submission of specimen other than nasopharyngeal swab, presence of viral mutation(s) within the areas targeted by this assay, and inadequate number of viral copies(<138 copies/mL). A negative result must be combined with clinical observations, patient history, and epidemiological information. The expected result is Negative.  Fact Sheet for Patients:  BloggerCourse.com  Fact Sheet for Healthcare Providers:  SeriousBroker.it  This test is no                          t yet approved or cleared by the [place] FDA and  has been authorized for detection and/or diagnosis of SARS-CoV-2 by FDA  under an Emergency Use Authorization (EUA). This EUA will remain  in effect (meaning this test can be used) for the duration of the COVID-19 declaration under Section 564(b)(1) of the Act, 21 U.S.C.section 360bbb-3(b)(1), unless the authorization is terminated  or revoked sooner.      . Influenza A by PCR 04/30/2021 NEGATIVE  NEGATIVE Final  . Influenza B by PCR 04/30/2021 NEGATIVE  NEGATIVE Final   Comment: (NOTE) The Xpert Xpress SARS-CoV-2/FLU/RSV plus assay is intended as an aid  in the diagnosis of influenza from Nasopharyngeal swab specimens and should not be used as a sole basis for treatment. Nasal washings and aspirates are unacceptable for Xpert Xpress SARS-CoV-2/FLU/RSV testing.  Fact Sheet for Patients: BloggerCourse.com  Fact Sheet for Healthcare Providers: SeriousBroker.it  This test is not yet approved or cleared by the [place] FDA and has been authorized for detection and/or diagnosis of SARS-CoV-2 by FDA under an Emergency Use Authorization (EUA). This EUA will remain in effect (meaning this test can be used) for the duration of the COVID-19 declaration under Section 564(b)(1) of the Act, 21 U.S.C. section 360bbb-3(b)(1), unless the authorization is terminated or revoked.  Performed at [facility], [address], [place], [place] [address]   . SARS Coronavirus 2 Ag 04/30/2021 Negative  Negative Preliminary  . WBC 04/30/2021 4.1  4.0 - 10.5 K/uL Final  . RBC 04/30/2021 5.04  3.87 - 5.11 MIL/uL Final  . Hemoglobin 04/30/2021 15.0  12.0 - 15.0 g/dL Final  . HCT 05/69/7948 47.0* 36.0 - 46.0 % Final  . MCV 04/30/2021 93.3  80.0 - 100.0 fL Final  . MCH 04/30/2021 29.8  26.0 - 34.0 pg Final  . MCHC 04/30/2021 31.9  30.0 - 36.0 g/dL Final  . RDW 01/65/5374 14.7  11.5 - 15.5 % Final  . Platelets 04/30/2021 257  150 - 400 K/uL Final  . nRBC 04/30/2021 0.0  0.0 - 0.2 % Final  . Neutrophils Relative %  04/30/2021 43  % Final  . Neutro Abs 04/30/2021 1.8  1.7 - 7.7 K/uL Final  . Lymphocytes Relative 04/30/2021 42  % Final  . Lymphs Abs 04/30/2021 1.7  0.7 - 4.0 K/uL Final  . Monocytes Relative 04/30/2021 9  % Final  . Monocytes Absolute 04/30/2021 0.4  0.1 - 1.0 K/uL Final  . Eosinophils Relative 04/30/2021 4  % Final  . Eosinophils Absolute 04/30/2021 0.2  0.0 - 0.5 K/uL Final  . Basophils Relative 04/30/2021 1  % Final  . Basophils Absolute 04/30/2021 0.0  0.0 - 0.1 K/uL Final  . Immature Granulocytes 04/30/2021 1  % Final  . Abs Immature Granulocytes 04/30/2021 0.03  0.00 - 0.07 K/uL Final   Performed at [facility], [address] [place], [place] [address]  . Sodium 04/30/2021 140  135 - 145 mmol/L Final  . Potassium 04/30/2021 3.8  3.5 - 5.1 mmol/L Final  . Chloride 04/30/2021 105  98 - 111 mmol/L Final  . CO2 04/30/2021 28  22 - 32 mmol/L Final  . Glucose, Bld 04/30/2021 102* 70 - 99 mg/dL Final   Glucose reference range applies only to samples taken after fasting for at least 8 hours.  . BUN 04/30/2021 17  6 - 20 mg/dL Final  . Creatinine, Ser 04/30/2021 0.86  0.44 - 1.00 mg/dL Final  . Calcium 86/75/4492 8.8* 8.9 - 10.3 mg/dL Final  . Total Protein 04/30/2021 5.9* 6.5 - 8.1 g/dL Final  . Albumin 01/00/7121 3.3* 3.5 - 5.0 g/dL Final  . AST 97/58/8325 31  15 - 41 U/L Final  . ALT 04/30/2021 24  0 - 44 U/L Final  . Alkaline Phosphatase 04/30/2021 63  38 - 126 U/L Final  . Total Bilirubin 04/30/2021 0.4  0.3 - 1.2 mg/dL Final  . GFR, Estimated 04/30/2021 >60  >60 mL/min Final   Comment: (NOTE) Calculated using the CKD-EPI Creatinine Equation (2021)   . Anion gap 04/30/2021 7  5 - 15 Final   Performed at  [facility], [address] [place], [place] [address]  . Hgb A1c MFr Bld 04/30/2021 5.5  4.8 - 5.6 % Final   Comment: (NOTE) Pre diabetes:          5.7%-6.4%  Diabetes:              >6.4%  Glycemic control for   <7.0% adults with diabetes   . Mean  Plasma Glucose 04/30/2021 111.15  mg/dL Final   Performed at [facility], [address] [place], [place] [address]  . TSH 04/30/2021 1.885  0.350 - 4.500 uIU/mL Final   Comment: Performed by a 3rd Generation assay with a functional sensitivity of <=0.01 uIU/mL. Performed at [facility], [address] [place], [place] [address]   . SARSCOV2ONAVIRUS 2 AG 04/30/2021 NEGATIVE  NEGATIVE Final   Comment: (NOTE) SARS-CoV-2 antigen NOT DETECTED.   Negative results are presumptive.  Negative results do not preclude SARS-CoV-2 infection and should not be used as the sole basis for treatment or other patient management decisions, including infection  control decisions, particularly in the presence of clinical signs and  symptoms consistent with COVID-19, or in those who have been in contact with the virus.  Negative results must be combined with clinical observations, patient history, and epidemiological information. The expected result is Negative.  Fact Sheet for Patients: https://www.jennings-kim.com/https://www.fda.gov/media/141569/download  Fact Sheet for Healthcare Providers: https://alexander-rogers.biz/https://www.fda.gov/media/141568/download  This test is not yet approved or cleared by the [place] FDA and  has been authorized for detection and/or diagnosis of SARS-CoV-2 by FDA under an Emergency Use Authorization (EUA).  This EUA will remain in effect (meaning this test can be used) for the duration of  the COV                          ID-19 declaration under Section 564(b)(1) of the Act, 21 U.S.C. section 360bbb-3(b)(1), unless the authorization is terminated or revoked sooner.    . Cholesterol 04/30/2021 165  0 - 200 mg/dL Final  . Triglycerides 04/30/2021 110  <150 mg/dL Final  . HDL 21/30/865705/06/2021 92  >40 mg/dL Final  . Total CHOL/HDL Ratio 04/30/2021 1.8  RATIO Final  . VLDL 04/30/2021 22  0 - 40 mg/dL Final  . LDL Cholesterol 04/30/2021 51  0 - 99 mg/dL Final   Comment:        Total Cholesterol/HDL:CHD  Risk Coronary Heart Disease Risk Table                     Men   Women  1/2 Average Risk   3.4   3.3  Average Risk       5.0   4.4  2 X Average Risk   9.6   7.1  3 X Average Risk  23.4   11.0        Use the calculated Patient Ratio above and the CHD Risk Table to determine the patient's CHD Risk.        ATP III CLASSIFICATION (LDL):  <100     mg/dL   Optimal  [id number]  mg/dL   Near or Above                    Optimal  130-159  mg/dL   Borderline  [id number]  mg/dL   High  >324>190     mg/dL   Very High Performed at [facility], [address] [place], [place] [address]   Admission on 04/09/2021, Discharged on 04/09/2021  Component Date Value Ref Range Status  . I-stat hCG, quantitative 04/09/2021 <5.0  <5 mIU/mL Final  . Comment 3 04/09/2021          Final   Comment:   GEST. AGE      CONC.  (mIU/mL)   <=1 WEEK        5 - 50     2 WEEKS       50 - 500     3 WEEKS       100 - 10,000     4 WEEKS     1,000 - 30,000        FEMALE AND NON-PREGNANT FEMALE:     LESS THAN 5 mIU/mL   . Troponin I (High Sensitivity) 04/09/2021 5  <18 ng/L Final   Comment: (NOTE) Elevated high sensitivity troponin I (hsTnI) values and significant  changes across serial measurements may suggest ACS but many other  chronic and acute conditions are known to elevate hsTnI results.  Refer to the "Links" section for chest pain algorithms and additional  guidance. Performed at [facility], [address] [place], [place] [address]   . WBC 04/09/2021 4.2  4.0 - 10.5 K/uL Final  . RBC 04/09/2021 5.27* 3.87 - 5.11 MIL/uL Final  . Hemoglobin 04/09/2021 15.7* 12.0 - 15.0 g/dL Final  . HCT 09/81/1914 48.9* 36.0 - 46.0 % Final  . MCV 04/09/2021 92.8  80.0 - 100.0 fL Final  . MCH 04/09/2021 29.8  26.0 - 34.0 pg Final  . MCHC 04/09/2021 32.1  30.0 - 36.0 g/dL Final  . RDW 78/29/5621 14.8  11.5 - 15.5 % Final  . Platelets 04/09/2021 214  150 - 400 K/uL Final  . nRBC 04/09/2021 0.0  0.0 - 0.2 % Final  .  Neutrophils Relative % 04/09/2021 49  % Final  . Neutro Abs 04/09/2021 2.1  1.7 - 7.7 K/uL Final  . Lymphocytes Relative 04/09/2021 35  % Final  . Lymphs Abs 04/09/2021 1.5  0.7 - 4.0 K/uL Final  . Monocytes Relative 04/09/2021 10  % Final  . Monocytes Absolute 04/09/2021 0.4  0.1 - 1.0 K/uL Final  . Eosinophils Relative 04/09/2021 4  % Final  . Eosinophils Absolute 04/09/2021 0.2  0.0 - 0.5 K/uL Final  . Basophils Relative 04/09/2021 1  % Final  . Basophils Absolute 04/09/2021 0.0  0.0 - 0.1 K/uL Final  . Immature Granulocytes 04/09/2021 1  % Final  . Abs Immature Granulocytes 04/09/2021 0.02  0.00 - 0.07 K/uL Final   Performed at [facility], [address] [place], [place] [address]  . Sodium 04/09/2021 140  135 - 145 mmol/L Final  . Potassium 04/09/2021 3.7  3.5 - 5.1 mmol/L Final  . Chloride 04/09/2021 104  98 - 111 mmol/L Final  . CO2 04/09/2021 25  22 - 32 mmol/L Final  . Glucose, Bld 04/09/2021 64* 70 - 99 mg/dL Final   Glucose reference range applies only to samples taken after fasting for at least 8 hours.  . BUN 04/09/2021 11  6 - 20 mg/dL Final  . Creatinine, Ser 04/09/2021 0.86  0.44 - 1.00 mg/dL Final  . Calcium 78/46/9629 9.3  8.9 - 10.3 mg/dL Final  . GFR, Estimated 04/09/2021 >60  >60 mL/min Final   Comment: (NOTE) Calculated using the CKD-EPI Creatinine Equation (2021)   . Anion gap 04/09/2021 11  5 -  15 Final   Performed at [facility], [address] [place], [place] [address]  . Troponin I (High Sensitivity) 04/09/2021 6  <18 ng/L Final   Comment: (NOTE) Elevated high sensitivity troponin I (hsTnI) values and significant  changes across serial measurements may suggest ACS but many other  chronic and acute conditions are known to elevate hsTnI results.  Refer to the "Links" section for chest pain algorithms and additional  guidance. Performed at [facility], [address], [place], [place] [address]     Blood Alcohol level:  Lab  Results  Component Value Date   ETH <10 04/30/2020   ETH <10 10/03/2019    Metabolic Disorder Labs: Lab Results  Component Value Date   HGBA1C 5.5 04/30/2021   MPG 111.15 04/30/2021   MPG 119.76 04/30/2020   Lab Results  Component Value Date   PROLACTIN 8.9 04/07/2018   PROLACTIN 14.3 09/23/2015   Lab Results  Component Value Date   CHOL 165 04/30/2021   TRIG 110 04/30/2021   HDL 92 04/30/2021   CHOLHDL 1.8 04/30/2021   VLDL 22 04/30/2021   LDLCALC 51 04/30/2021   LDLCALC 70 04/30/2020    Therapeutic Lab Levels: No results found for: LITHIUM Lab Results  Component Value Date   VALPROATE 117.9 (H) 05/27/2009   VALPROATE 116.9 (H) 05/26/2009   No components found for:  CBMZ  Physical Findings   AIMS   Flowsheet Row Admission (Discharged) from 04/30/2020 [facility] Admission (Discharged) from OP Visit from 10/03/2019 in [facility] Admission (Discharged) from OP [facility] Admission (Discharged) from 04/02/2018 [facility] Admission (Discharged) from 05/17/2016 [facility]  AIMS Total Score 0 0 0 0 0    AUDIT   Flowsheet Row Admission (Discharged) from 04/30/2020 in [facility] Admission (Discharged) from OP Visit from 11/05/2018 [facility] Admission (Discharged) from 04/02/2018 [facility] Admission (Discharged) from 05/17/2016 [facility] Admission (Discharged) from 09/21/2015 [facility]  Alcohol Use Disorder Identification Test Final Score (AUDIT) 27 39 PHQ2-9   Flowsheet Row Office Visit from 05/31/2018 in [facility] Office Visit from 03/01/2017 in [facility] Office Visit from  02/15/2016 in [facility]  PHQ-2 Total Score 1 0 0    Flowsheet Row ED from 04/29/2021 in [facility] Most recent reading at 04/30/2021  1:36 AM OP Visit from 04/29/2021 [facility] Most recent reading at 04/29/2021  7:47 PM ED from 04/09/2021 in [facility] EMERGENCY DEPARTMENT Most recent reading at 04/09/2021  6:21 AM  C-SSRS RISK CATEGORY Low Risk High Risk No Risk       Musculoskeletal  Strength & Muscle Tone: within normal limits Gait & Station: normal Patient leans: N/A  Psychiatric Specialty Exam  Presentation  General Appearance: Bizarre  Eye Contact:Fair; Fleeting  Speech:Clear and Coherent; Normal Rate  Speech Volume:Normal  Handedness:No data recorded  Mood and Affect  Mood:Anxious  Affect:Congruent   Thought Process  Thought Processes:Coherent; Goal Directed  Descriptions of Associations:Circumstantial  Orientation:Full (Time, Place and Person)  Thought Content:Paranoid Ideation; Scattered  Diagnosis of Schizophrenia or Schizoaffective disorder in past: Yes  Duration of Psychotic Symptoms: Greater than six months   Hallucinations:No data recorded Ideas of Reference:Paranoia  Suicidal Thoughts:No data recorded Homicidal Thoughts:No data recorded  Sensorium  Memory:Immediate Fair; Recent Fair; Remote Fair  Judgment:Fair  Insight:Fair   Executive Functions  Concentration:Fair  Attention Span:Fair  Recall:Fair  Fund of Knowledge:Fair  Language:Fair   Psychomotor Activity  Psychomotor Activity:No data recorded  Assets  Assets:Communication Skills; Desire for Improvement; Financial Resources/Insurance; Housing; Leisure Time; Physical Health   Sleep  Sleep:No data recorded  No data recorded  Physical Exam  Physical Exam Vitals reviewed.  Neurological:     Mental Status: She is alert.  Psychiatric:        Attention and Perception:  Attention normal.        Mood and Affect: Mood normal.        Speech: Speech normal.        Behavior: Behavior normal.        Thought Content: Thought content normal.        Cognition and Memory: Cognition normal.        Judgment: Judgment normal.    ROS Blood pressure (!) 130/92, pulse 64, temperature 97.7 F (36.5 C), temperature source Oral, resp. rate 18, SpO2 100 %. There is no height or weight on file to calculate BMI.  Treatment Plan Summary: Daily contact with patient to assess and evaluate symptoms and progress in treatment and Medication management   -Continue Seroquel 100 mg p.o. nightly CSW to follow-up with additional outpatient resources for housing Anticipated discharge 05/02/2021  Oneta Rack, NP 05/01/2021 7:42 PM
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
## 2021-05-02 MED ORDER — QUETIAPINE FUMARATE 100 MG PO TABS: 100.0000 mg | ORAL_TABLET | Freq: Every day | ORAL | 0 refills | Status: DC

## 2021-05-02 NOTE — ED Notes (Signed)
Ambulated per self to retrieve belongings. No SI, HI, or AVH. No c/o pain, discomfort, or acute distress. A&O x4. Educated on d/c instructions. Verbalized understanding. Escorted to front lobby and d/c. Stable at time of d/c
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
## 2021-05-02 NOTE — ED Notes (Signed)
Resting with eyes closed. Rise and fall of chest noted. No new issues noted at this time. Will continue to monitor for safety 

## 2021-05-02 NOTE — ED Notes (Signed)
Breakfast given.  
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
## 2021-05-02 NOTE — ED Notes (Signed)
Pt asleep in bed. Respirations even and unlabored. Will continue to monitor for safety. ?

## 2021-10-19 ENCOUNTER — Other Ambulatory Visit: Payer: Self-pay

## 2021-10-19 ENCOUNTER — Emergency Department (HOSPITAL_COMMUNITY)
Admission: EM | Admit: 2021-10-19 | Discharge: 2021-10-20 | Disposition: A | Discharge status: Home / Self Care | Payer: Medicare Other | Attending: Emergency Medicine | Admitting: Emergency Medicine

## 2021-10-19 ENCOUNTER — Encounter (HOSPITAL_COMMUNITY): Payer: Self-pay

## 2021-10-19 ENCOUNTER — Emergency Department (HOSPITAL_COMMUNITY): Payer: Medicare Other

## 2021-10-19 DIAGNOSIS — S0083XA Contusion of other part of head, initial encounter: Secondary | ICD-10-CM | POA: Insufficient documentation

## 2021-10-19 DIAGNOSIS — F17210 Nicotine dependence, cigarettes, uncomplicated: Secondary | ICD-10-CM | POA: Insufficient documentation

## 2021-10-19 DIAGNOSIS — S060X0A Concussion without loss of consciousness, initial encounter: Secondary | ICD-10-CM | POA: Diagnosis not present
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
## 2021-10-20 ENCOUNTER — Emergency Department (HOSPITAL_COMMUNITY): Payer: Medicare Other

## 2021-10-20 MED ORDER — IBUPROFEN 200 MG PO TABS
400.0000 mg | ORAL_TABLET | Freq: Once | ORAL | Status: AC
  Administered 2021-10-20: 400 mg via ORAL
  Filled 2021-10-20: qty 2

## 2021-10-20 MED ORDER — CYCLOBENZAPRINE HCL 10 MG PO TABS
5.0000 mg | ORAL_TABLET | Freq: Once | ORAL | Status: AC
  Administered 2021-10-20: 5 mg via ORAL
  Filled 2021-10-20: qty 1
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
## 2021-11-14 ENCOUNTER — Other Ambulatory Visit: Payer: Self-pay | Admitting: Family Medicine

## 2021-11-14 DIAGNOSIS — Z1231 Encounter for screening mammogram for malignant neoplasm of breast: Secondary | ICD-10-CM

## 2021-11-15 ENCOUNTER — Ambulatory Visit: Payer: Medicare Other

## 2021-11-16 ENCOUNTER — Other Ambulatory Visit: Payer: Self-pay | Admitting: Family Medicine

## 2021-11-16 DIAGNOSIS — N6320 Unspecified lump in the left breast, unspecified quadrant: Secondary | ICD-10-CM

## 2021-12-29 ENCOUNTER — Ambulatory Visit
Admission: RE | Admit: 2021-12-29 | Discharge: 2021-12-29 | Disposition: A | Discharge status: Home / Self Care | Payer: Commercial Managed Care - HMO | Source: Ambulatory Visit | Attending: Family Medicine | Admitting: Family Medicine

## 2021-12-29 ENCOUNTER — Ambulatory Visit
Admission: RE | Admit: 2021-12-29 | Discharge: 2021-12-29 | Disposition: A | Discharge status: Home / Self Care | Payer: Medicare Other | Source: Ambulatory Visit | Attending: Family Medicine | Admitting: Family Medicine

## 2021-12-29 DIAGNOSIS — N6320 Unspecified lump in the left breast, unspecified quadrant: Secondary | ICD-10-CM

## 2022-06-24 ENCOUNTER — Other Ambulatory Visit: Payer: Self-pay

## 2022-06-24 ENCOUNTER — Encounter (HOSPITAL_COMMUNITY): Payer: Self-pay

## 2022-06-24 ENCOUNTER — Emergency Department (HOSPITAL_COMMUNITY)
Admission: EM | Admit: 2022-06-24 | Discharge: 2022-06-24 | Disposition: A | Discharge status: Home / Self Care | Payer: Medicare Other | Attending: Emergency Medicine | Admitting: Emergency Medicine

## 2022-06-24 DIAGNOSIS — W228XXA Striking against or struck by other objects, initial encounter: Secondary | ICD-10-CM | POA: Insufficient documentation

## 2022-06-24 DIAGNOSIS — S0993XA Unspecified injury of face, initial encounter: Secondary | ICD-10-CM | POA: Diagnosis present

## 2022-06-24 DIAGNOSIS — S00531A Contusion of lip, initial encounter: Secondary | ICD-10-CM

## 2022-06-24 DIAGNOSIS — S01511A Laceration without foreign body of lip, initial encounter: Secondary | ICD-10-CM | POA: Insufficient documentation
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
## 2022-07-20 ENCOUNTER — Other Ambulatory Visit: Payer: Self-pay

## 2022-07-20 ENCOUNTER — Emergency Department (HOSPITAL_COMMUNITY)
Admission: EM | Admit: 2022-07-20 | Discharge: 2022-07-21 | Disposition: A | Discharge status: Home / Self Care | Payer: Medicare Other | Attending: Emergency Medicine | Admitting: Emergency Medicine

## 2022-07-20 DIAGNOSIS — R1033 Periumbilical pain: Secondary | ICD-10-CM | POA: Diagnosis not present

## 2022-07-20 DIAGNOSIS — I10 Essential (primary) hypertension: Secondary | ICD-10-CM | POA: Diagnosis not present

## 2022-07-20 DIAGNOSIS — R112 Nausea with vomiting, unspecified: Secondary | ICD-10-CM | POA: Diagnosis not present

## 2022-07-20 DIAGNOSIS — Z7982 Long term (current) use of aspirin: Secondary | ICD-10-CM | POA: Diagnosis not present

## 2022-07-20 DIAGNOSIS — R1013 Epigastric pain: Secondary | ICD-10-CM | POA: Diagnosis not present

## 2022-07-20 DIAGNOSIS — R109 Unspecified abdominal pain: Secondary | ICD-10-CM | POA: Diagnosis present

## 2022-07-21 LAB — COMPREHENSIVE METABOLIC PANEL
ALT: 27 U/L (ref 0–44)
AST: 33 U/L (ref 15–41)
Albumin: 4.3 g/dL (ref 3.5–5.0)
Alkaline Phosphatase: 68 U/L (ref 38–126)
Anion gap: 8 (ref 5–15)
BUN: 14 mg/dL (ref 6–20)
CO2: 24 mmol/L (ref 22–32)
Calcium: 9.5 mg/dL (ref 8.9–10.3)
Chloride: 108 mmol/L (ref 98–111)
Creatinine, Ser: 0.85 mg/dL (ref 0.44–1.00)
GFR, Estimated: >60 mL/min (ref >60)
Glucose, Bld: 102 mg/dL — ABNORMAL HIGH (ref 70–99)
Potassium: 3.9 mmol/L (ref 3.5–5.1)
Sodium: 140 mmol/L (ref 135–145)
Total Bilirubin: 1.1 mg/dL (ref 0.3–1.2)
Total Protein: 7.2 g/dL (ref 6.5–8.1)

## 2022-07-21 LAB — CBC
HCT: 49 % — ABNORMAL HIGH (ref 36.0–46.0)
Hemoglobin: 15.7 g/dL — ABNORMAL HIGH (ref 12.0–15.0)
MCH: 29.5 pg (ref 26.0–34.0)
MCHC: 32 g/dL (ref 30.0–36.0)
MCV: 92.1 fL (ref 80.0–100.0)
Platelets: 241 10*3/uL (ref 150–400)
RBC: 5.32 MIL/uL — ABNORMAL HIGH (ref 3.87–5.11)
RDW: 14.6 % (ref 11.5–15.5)
WBC: 6.3 10*3/uL (ref 4.0–10.5)
nRBC: 0 % (ref 0.0–0.2)

## 2022-07-21 LAB — LIPASE, BLOOD: Lipase: 25 U/L (ref 11–51)

## 2022-07-21 LAB — I-STAT BETA HCG BLOOD, ED (MC, WL, AP ONLY): I-stat hCG, quantitative: <5 m[IU]/mL (ref <5)

## 2022-07-21 MED ORDER — ONDANSETRON HCL 4 MG/2ML IJ SOLN
4.0000 mg | Freq: Once | INTRAMUSCULAR | Status: AC
  Administered 2022-07-21: 4 mg via INTRAVENOUS
  Filled 2022-07-21: qty 2

## 2022-07-21 MED ORDER — FAMOTIDINE 20 MG PO TABS: 20.0000 mg | ORAL_TABLET | Freq: Two times a day (BID) | ORAL | 0 refills | Status: DC

## 2022-07-21 MED ORDER — ONDANSETRON HCL 4 MG PO TABS: 4.0000 mg | ORAL_TABLET | Freq: Four times a day (QID) | ORAL | 0 refills | Status: DC

## 2022-07-21 MED ORDER — ALUM & MAG HYDROXIDE-SIMETH 200-200-20 MG/5ML PO SUSP
30.0000 mL | Freq: Once | ORAL | Status: AC
  Administered 2022-07-21: 30 mL via ORAL
  Filled 2022-07-21: qty 30

## 2022-07-21 MED ORDER — SODIUM CHLORIDE 0.9 % IV BOLUS
1000.0000 mL | Freq: Once | INTRAVENOUS | Status: AC
  Administered 2022-07-21: 1000 mL via INTRAVENOUS

## 2022-07-21 MED ORDER — FAMOTIDINE IN NACL 20-0.9 MG/50ML-% IV SOLN
20.0000 mg | Freq: Once | INTRAVENOUS | Status: AC
  Administered 2022-07-21: 20 mg via INTRAVENOUS
  Filled 2022-07-21: qty 50

## 2022-07-21 NOTE — Discharge Instructions (Addendum)
Use prescribed nausea medicine as needed and take prescribed Pepcid twice daily to help protect your stomach from acid.  Avoid NSAIDs like aspirin, Bayers, ibuprofen, Motrin, Aleve as these can worsen symptoms.  Avoid alcohol.  Follow-up with your primary care doctor.  Return for new or worsening symptoms.

## 2022-07-21 NOTE — ED Triage Notes (Signed)
Pt c/o hematemesis, abd pain since "just now." Pt given emesis bag, appeared to attempt to throw up but unsuccessful. Pt continuously getting out of chair & laying down on the ground saying "my chest hurts, I feel like I'm slowly drifting away & I just want to close my eyes." Pt A&O4, in no distress at time of triage.

## 2022-07-21 NOTE — ED Notes (Signed)
Pt tolerating PO fluids

## 2022-07-21 NOTE — ED Notes (Signed)
Patient verbalizes understanding of discharge instructions. Opportunity for questioning and answers were provided. Pt a and o x 4, ambulatory with steady gait; Pt discharged from ED. 

## 2022-07-21 NOTE — ED Provider Notes (Signed)
[facility] EMERGENCY DEPARTMENT Provider Note   CSN: [id number] Arrival date & time: 07/20/22  2354     History  Chief Complaint  Patient presents with   Abdominal Pain   Emesis    [name] is a 61 y.o. female.  [name] is a 61 y.o. female with a history of bipolar disorder, schizophrenia and drug abuse, who presents to the emergency department reporting hematemesis.  Patient reports prior to arrival she had 2 episodes of bloody emesis.  She describes this as bright red blood but has difficulty quantifying how much blood was present in the emesis.  She reports some associated mild abdominal and chest discomfort she reports it feels like the pain radiates up into her chest when she vomits.  She denies any blood in her stool or dark black stools.  She does report prior to the symptoms starting drinking alcohol and using cocaine.  Denies other drug use.  Denies prior history of hematemesis.  No known history of liver disease.  She reports that she has had a few more episodes of vomiting here in the ED but but has not seen any additional blood in her vomit and reports it was yellow in color.  The history is provided by the patient and medical records.  Abdominal Pain Associated symptoms: chest pain and vomiting   Associated symptoms: no chills, no constipation, no cough, no diarrhea, no fever and no shortness of breath   Emesis Associated symptoms: abdominal pain   Associated symptoms: no chills, no cough, no diarrhea and no fever        Home Medications Prior to Admission medications   Medication Sig Start Date End Date Taking? Authorizing Provider  aspirin 81 MG chewable tablet Chew 81 mg by mouth daily.   Yes [provider]  Ensure (ENSURE) Take 237 mLs by mouth daily as needed.   Yes [provider]  famotidine (PEPCID) 20 MG tablet Take 1 tablet (20 mg total) by mouth 2 (two) times daily. 07/21/22  Yes Dartha Lodge, PA-C   Multiple Vitamins-Minerals (ONE-A-DAY WOMENS 50+) TABS Take 1 tablet by mouth daily.   Yes [provider]  ondansetron (ZOFRAN) 4 MG tablet Take 1 tablet (4 mg total) by mouth every 6 (six) hours. 07/21/22  Yes Dartha Lodge, PA-C  QUEtiapine (SEROQUEL) 200 MG tablet Take 200 mg by mouth at bedtime. 04/30/20  Yes [provider]      Allergies    Patient has no known allergies.    Review of Systems   Review of Systems  Constitutional:  Negative for chills and fever.  Respiratory:  Negative for cough and shortness of breath.   Cardiovascular:  Positive for chest pain.  Gastrointestinal:  Positive for abdominal pain and vomiting. Negative for blood in stool, constipation and diarrhea.  Neurological:  Negative for syncope.    Physical Exam Updated Vital Signs BP (!) 140/49 (BP Location: Right Arm)   Pulse (!) 54   Temp 98.1 F (36.7 C) (Oral)   Resp 15   SpO2 98%  Physical Exam Vitals and nursing note reviewed.  Constitutional:      General: She is not in acute distress.    Appearance: Normal appearance. She is well-developed. She is not diaphoretic.     Comments: Sleeping but easily arousable, no current vomiting, no acute distress  HENT:     Head: Normocephalic and atraumatic.  Eyes:     General:  Right eye: No discharge.        Left eye: No discharge.     Pupils: Pupils are equal, round, and reactive to light.  Cardiovascular:     Rate and Rhythm: Normal rate and regular rhythm.     Pulses: Normal pulses.     Heart sounds: Normal heart sounds.  Pulmonary:     Effort: Pulmonary effort is normal. No respiratory distress.     Breath sounds: Normal breath sounds. No wheezing or rales.     Comments: Respirations equal and unlabored, patient able to speak in full sentences, lungs clear to auscultation bilaterally  Chest:     Chest wall: No tenderness.  Abdominal:     General: Bowel sounds are normal. There is no distension.     Palpations: Abdomen  is soft. There is no mass.     Tenderness: There is abdominal tenderness. There is no guarding.     Comments: Abdomen soft, nondistended, epigastric and periumbilical no guarding or rebound tenderness all other quadrants nontender  Musculoskeletal:        General: No deformity.     Cervical back: Neck supple.  Skin:    General: Skin is warm and dry.     Capillary Refill: Capillary refill takes less than 2 seconds.  Neurological:     Mental Status: She is oriented to person, place, and time.     Coordination: Coordination normal.     Comments: Speech is clear, able to follow commands Moves extremities without ataxia, coordination intact  Psychiatric:        Mood and Affect: Mood normal.        Behavior: Behavior normal.     ED Results / Procedures / Treatments   Labs (all labs ordered are listed, but only abnormal results are displayed) Labs Reviewed  COMPREHENSIVE METABOLIC PANEL - Abnormal; Notable for the following components:      Result Value   Glucose, Bld 102 (*)    All other components within normal limits  CBC - Abnormal; Notable for the following components:   RBC 5.32 (*)    Hemoglobin 15.7 (*)    HCT 49.0 (*)    All other components within normal limits  LIPASE, BLOOD  I-STAT BETA HCG BLOOD, ED ([facility], [facility], [facility] ONLY)    EKG EKG Interpretation  Date/Time:  Friday July 21 2022 08:04:54 EDT Ventricular Rate:  56 PR Interval:  116 QRS Duration: 95 QT Interval:  459 QTC Calculation: 443 R Axis:   -68 Text Interpretation: Sinus rhythm Borderline short PR interval Left anterior fascicular block Probable left ventricular hypertrophy Anterior Q waves, possibly due to LVH No significant change since last tracing Confirmed by Jacalyn Lefevre [id number]) on 07/21/2022 8:21:25 AM  Radiology No results found.  Procedures Procedures    Medications Ordered in ED Medications  sodium chloride 0.9 % bolus 1,000 mL (0 mLs Intravenous Stopped 07/21/22 1034)  ondansetron (ZOFRAN)  injection 4 mg (4 mg Intravenous Given 07/21/22 0920)  famotidine (PEPCID) IVPB 20 mg premix (0 mg Intravenous Stopped 07/21/22 1015)  alum & mag hydroxide-simeth (MAALOX/MYLANTA) 200-200-20 MG/5ML suspension 30 mL (30 mLs Oral Given 07/21/22 2542)    ED Course/ Medical Decision Making/ A&P                           Medical Decision Making Amount and/or Complexity of Data Reviewed Labs: ordered.   61 y.o. female presents to the ED with complaints of  hematemesis and abdominal pain, this involves an extensive number of treatment options, and is a complaint that carries with it a high risk of complications and morbidity.  The differential diagnosis includes Mallory-Weiss tear, upper GI bleed, gastritis, gastric ulcer.  On arrival pt is nontoxic, vitals stable initially with hypertension on arrival, resolved upon my evaluation. Exam significant for minimal tenderness in the epigastric and periumbilical regions but overall very reassuring abdominal exam, currently denying any chest pain, no active vomiting.  No reported hematemesis since arrival to the hospital.  Additional history obtained from chart review. Previous records obtained and reviewed   I ordered medications including IV fluid bolus, Zofran, Pepcid and Maalox  EKG: Normal sinus rhythm, shortened PR interval, no significant changes when compared to prior EKG  Lab Tests:  I Ordered, reviewed, and interpreted labs, which included: No leukocytosis, hemoglobin slightly elevated, likely due to hemoconcentration or smoking, no significant electrolyte derangements, normal renal and liver function and normal lipase, negative pregnancy.  Imaging Studies ordered:  Considered imaging but given overall reassuring abdominal exam do not feel that it would change management at this time  ED Course:   While patient reports episodes of hematemesis at home prior to arrival she has had vomiting here in the department but with no further blood in her  vomit.  Suspect potential Mallory-Weiss tear.  Patient has stable hemoglobin and is hemodynamically stable, low suspicion for upper GI bleed.  TDD:[id number] ratio does not support this either.  Patient has been sleeping comfortably and on reevaluation all of her symptoms resolved and she is tolerating p.o.  Will discharge home with symptomatic treatment and outpatient follow-up.  Discharged home in good condition.   Portions of this note were generated with Scientist, clinical (histocompatibility and immunogenetics). Dictation errors may occur despite best attempts at proofreading.         Final Clinical Impression(s) / ED Diagnoses Final diagnoses:  Nausea and vomiting, unspecified vomiting type  Epigastric pain    Rx / DC Orders ED Discharge Orders          Ordered    ondansetron (ZOFRAN) 4 MG tablet  Every 6 hours        07/21/22 1023    famotidine (PEPCID) 20 MG tablet  2 times daily        07/21/22 1023              Legrand Rams 07/21/22 1229    Jacalyn Lefevre, MD 07/21/22 [phone]

## 2022-07-21 NOTE — ED Notes (Signed)
Encouraged pt to provide urine sample; pt states she is unable to provide one at this time

## 2022-07-21 NOTE — ED Notes (Signed)
Pt resting with eyes closed; respirations spontaneous, even, unlabored 

## 2022-09-30 ENCOUNTER — Encounter (HOSPITAL_COMMUNITY): Payer: Self-pay

## 2022-09-30 ENCOUNTER — Emergency Department (HOSPITAL_COMMUNITY)
Admission: EM | Admit: 2022-09-30 | Discharge: 2022-09-30 | Disposition: A | Discharge status: Home / Self Care | Payer: Medicare Other | Attending: Emergency Medicine | Admitting: Emergency Medicine

## 2022-09-30 ENCOUNTER — Other Ambulatory Visit: Payer: Self-pay

## 2022-09-30 ENCOUNTER — Emergency Department (HOSPITAL_COMMUNITY): Payer: Medicare Other

## 2022-09-30 DIAGNOSIS — Z7982 Long term (current) use of aspirin: Secondary | ICD-10-CM | POA: Insufficient documentation

## 2022-09-30 DIAGNOSIS — S99911A Unspecified injury of right ankle, initial encounter: Secondary | ICD-10-CM | POA: Diagnosis present

## 2022-09-30 DIAGNOSIS — S82891A Other fracture of right lower leg, initial encounter for closed fracture: Secondary | ICD-10-CM

## 2022-09-30 DIAGNOSIS — X501XXA Overexertion from prolonged static or awkward postures, initial encounter: Secondary | ICD-10-CM | POA: Diagnosis not present

## 2022-09-30 DIAGNOSIS — S8264XA Nondisplaced fracture of lateral malleolus of right fibula, initial encounter for closed fracture: Secondary | ICD-10-CM | POA: Diagnosis not present

## 2022-09-30 DIAGNOSIS — S92354A Nondisplaced fracture of fifth metatarsal bone, right foot, initial encounter for closed fracture: Secondary | ICD-10-CM | POA: Diagnosis not present

## 2022-09-30 MED ORDER — OXYCODONE-ACETAMINOPHEN 5-325 MG PO TABS: 1.0000 | ORAL_TABLET | Freq: Four times a day (QID) | ORAL | 0 refills | Status: DC | PRN

## 2022-09-30 MED ORDER — IBUPROFEN 800 MG PO TABS
800.0000 mg | ORAL_TABLET | Freq: Once | ORAL | Status: AC
  Administered 2022-09-30: 800 mg via ORAL
  Filled 2022-09-30: qty 1

## 2022-09-30 MED ORDER — NAPROXEN 375 MG PO TABS: 375.0000 mg | ORAL_TABLET | Freq: Two times a day (BID) | ORAL | 0 refills | Status: DC

## 2022-09-30 MED ORDER — OXYCODONE-ACETAMINOPHEN 5-325 MG PO TABS
2.0000 | ORAL_TABLET | Freq: Once | ORAL | Status: AC
  Administered 2022-09-30: 2 via ORAL
  Filled 2022-09-30: qty 2

## 2022-09-30 NOTE — Progress Notes (Signed)
Orthopedic Tech Progress Note Patient Details:  [name] [birth year] [id number]  Ortho Devices Type of Ortho Device: Short leg splint, Stirrup splint, Crutches Ortho Device/Splint Location: Right foot/ankle Ortho Device/Splint Interventions: Application   Post Interventions Patient Tolerated: Well  [name] [name] 09/30/2022, 12:20 PM

## 2022-09-30 NOTE — ED Triage Notes (Signed)
Patient twisted/rolled right ankle yesterday while wearing heels. Now complains of severe pain with ambulation and any weight bearing.

## 2022-09-30 NOTE — ED Provider Notes (Signed)
[facility] EMERGENCY DEPARTMENT Provider Note   CSN: [id number] Arrival date & time: 09/30/22  5638     History {Add pertinent medical, surgical, social history, OB history to HPI:1} No chief complaint on file.   [name] is a 61 y.o. female.  The history is provided by the patient.  Ankle Pain Location:  Ankle Injury: yes   Mechanism of injury: fall   Fall:    Fall occurred:  Standing   Impact surface:  Hard floor   Entrapped after fall: no   Ankle location:  R ankle Pain details:    Quality:  Aching and throbbing   Radiates to:  Does not radiate   Severity:  Severe   Timing:  Constant   Progression:  Worsening Chronicity:  New Relieved by:  Nothing Worsened by:  Adduction Ineffective treatments: asa. Associated symptoms: decreased ROM and stiffness   Associated symptoms: no back pain and no muscle weakness        Home Medications Prior to Admission medications   Medication Sig Start Date End Date Taking? Authorizing Provider  aspirin 81 MG chewable tablet Chew 81 mg by mouth daily.    [provider]  Ensure (ENSURE) Take 237 mLs by mouth daily as needed.    [provider]  famotidine (PEPCID) 20 MG tablet Take 1 tablet (20 mg total) by mouth 2 (two) times daily. 07/21/22   Dartha Lodge, PA-C  Multiple Vitamins-Minerals (ONE-A-DAY WOMENS 50+) TABS Take 1 tablet by mouth daily.    [provider]  ondansetron (ZOFRAN) 4 MG tablet Take 1 tablet (4 mg total) by mouth every 6 (six) hours. 07/21/22   Dartha Lodge, PA-C  QUEtiapine (SEROQUEL) 200 MG tablet Take 200 mg by mouth at bedtime. 04/30/20   [provider]      Allergies    Patient has no known allergies.    Review of Systems   Review of Systems  Musculoskeletal:  Positive for stiffness. Negative for back pain.    Physical Exam Updated Vital Signs BP 135/88 (BP Location: Left Arm)   Pulse 69   Temp 98.9 F (37.2 C) (Oral)   Resp 12    SpO2 98%  Physical Exam Vitals and nursing note reviewed.  Constitutional:      General: She is not in acute distress.    Appearance: She is well-developed. She is not diaphoretic.  HENT:     Head: Normocephalic and atraumatic.     Right Ear: External ear normal.     Left Ear: External ear normal.     Nose: Nose normal.     Mouth/Throat:     Mouth: Mucous membranes are moist.  Eyes:     General: No scleral icterus.    Conjunctiva/sclera: Conjunctivae normal.  Cardiovascular:     Rate and Rhythm: Normal rate and regular rhythm.     Heart sounds: Normal heart sounds. No murmur heard.    No friction rub. No gallop.  Pulmonary:     Effort: Pulmonary effort is normal. No respiratory distress.     Breath sounds: Normal breath sounds.  Abdominal:     General: Bowel sounds are normal. There is no distension.     Palpations: Abdomen is soft. There is no mass.     Tenderness: There is no abdominal tenderness. There is no guarding.  Musculoskeletal:     Cervical back: Normal range of motion.     Comments: There is swelling and tenderness over  the lateral malleolus.No overt deformity. No tenderness over the medial aspect of the ankle. The fifth metatarsal is not tender. The ankle joint is intact without excessive opening on stressing.   Skin:    General: Skin is warm and dry.  Neurological:     Mental Status: She is alert and oriented to person, place, and time.  Psychiatric:        Behavior: Behavior normal.    ED Results / Procedures / Treatments   Labs (all labs ordered are listed, but only abnormal results are displayed) Labs Reviewed - No data to display  EKG None  Radiology No results found.  Procedures Procedures  {Document cardiac monitor, telemetry assessment procedure when appropriate:1}  Medications Ordered in ED Medications - No data to display  ED Course/ Medical Decision Making/ A&P                           Medical Decision Making Amount and/or  Complexity of Data Reviewed Radiology: ordered.  Risk Prescription drug management.   ***  {Document critical care time when appropriate:1} {Document review of labs and clinical decision tools ie heart score, Chads2Vasc2 etc:1}  {Document your independent review of radiology images, and any outside records:1} {Document your discussion with family members, caretakers, and with consultants:1} {Document social determinants of health affecting pt's care:1} {Document your decision making why or why not admission, treatments were needed:1} Final Clinical Impression(s) / ED Diagnoses Final diagnoses:  None    Rx / DC Orders ED Discharge Orders     None

## 2022-09-30 NOTE — Discharge Instructions (Addendum)
DO NOT bear weight on your ankle until you see the orthopedic doctor Get help right away if: You have severe pain that lasts. You develop new pain or swelling. Your skin or toenails below the injury turn blue or gray, feel cold, become numb, or are less sensitive to the touch.

## 2022-10-24 IMAGING — MG DIGITAL DIAGNOSTIC BILAT W/ TOMO W/ CAD
8 series · 8 of 24 positions shown · non-contrast
Comparison: No previous mammogram.

CLINICAL DATA: Patient presents today for further characterization
of a possible 9 mm mass identified within the upper LEFT breast on
chest CT angiogram of 04/09/2021.

EXAM:
DIGITAL DIAGNOSTIC BILATERAL MAMMOGRAM WITH TOMOSYNTHESIS AND CAD;
ULTRASOUND LEFT BREAST LIMITED
TECHNIQUE: Bilateral digital diagnostic mammography and breast tomosynthesis
was performed. The images were evaluated with computer-aided
detection.; Targeted ultrasound examination of the left breast was
performed.

[R CC synth-2D]
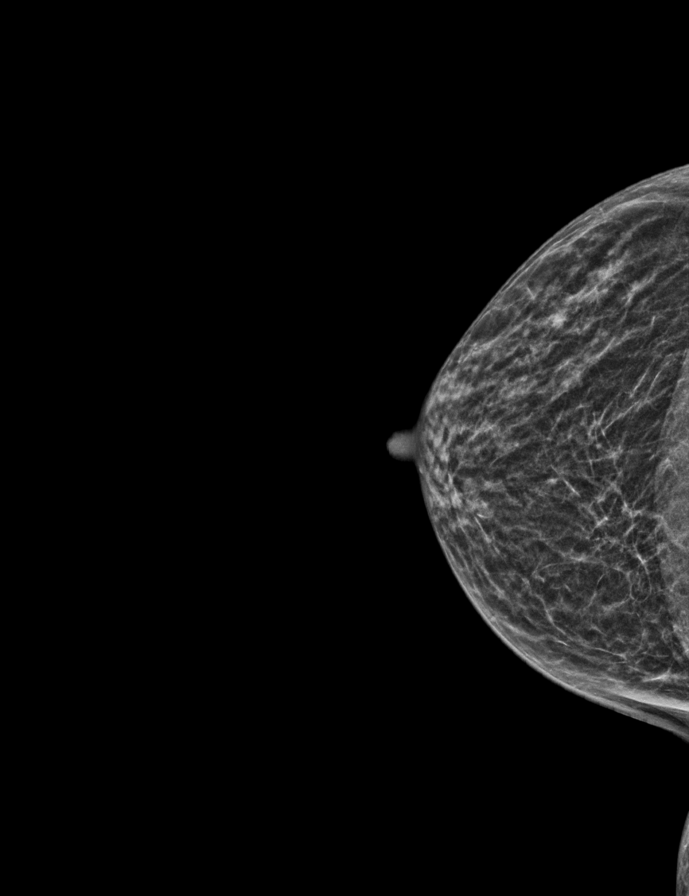

[L MLO synth-2D]
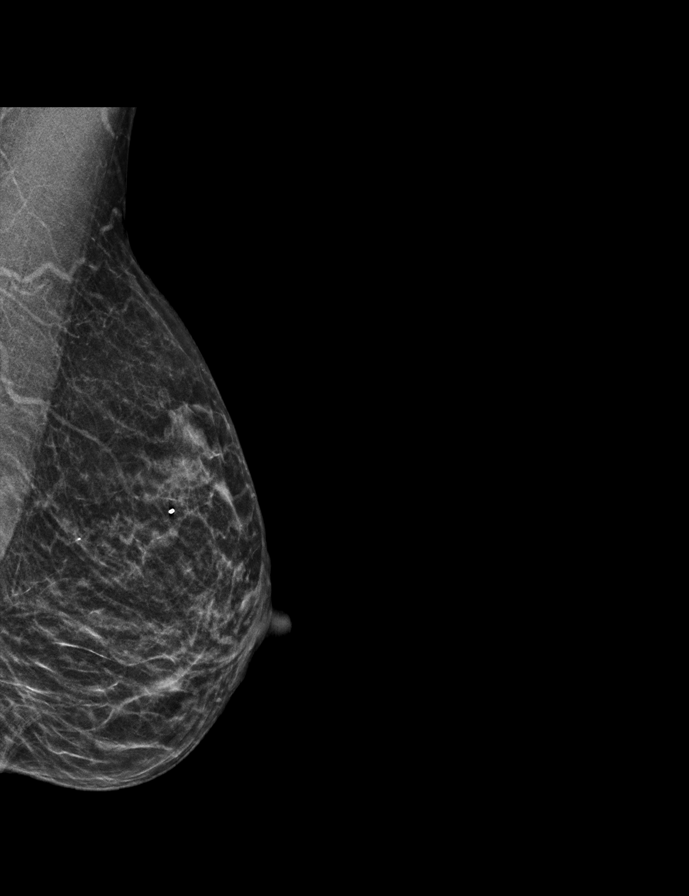

[L CC synth-2D]
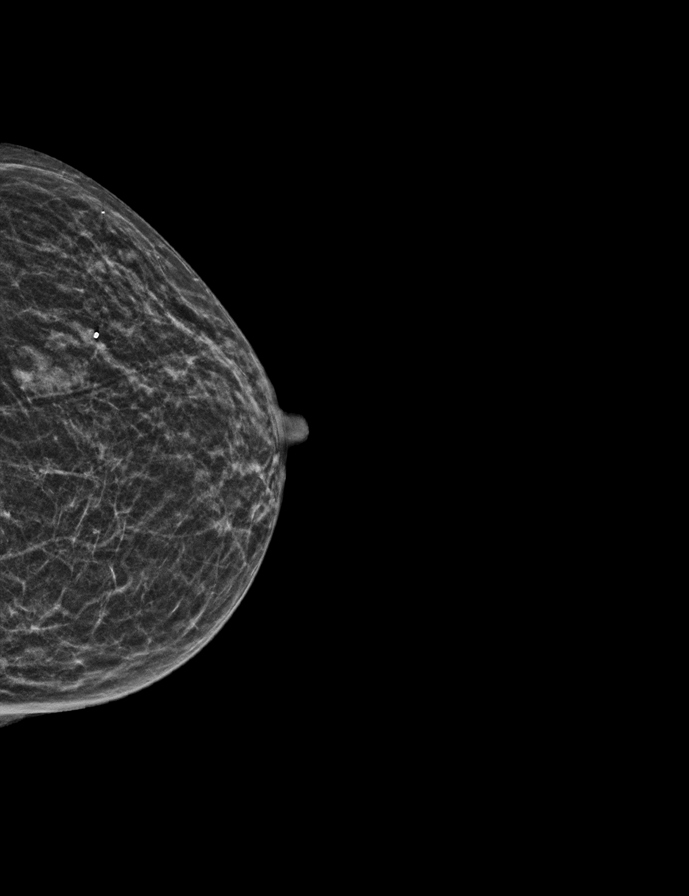

[R MLO synth-2D]
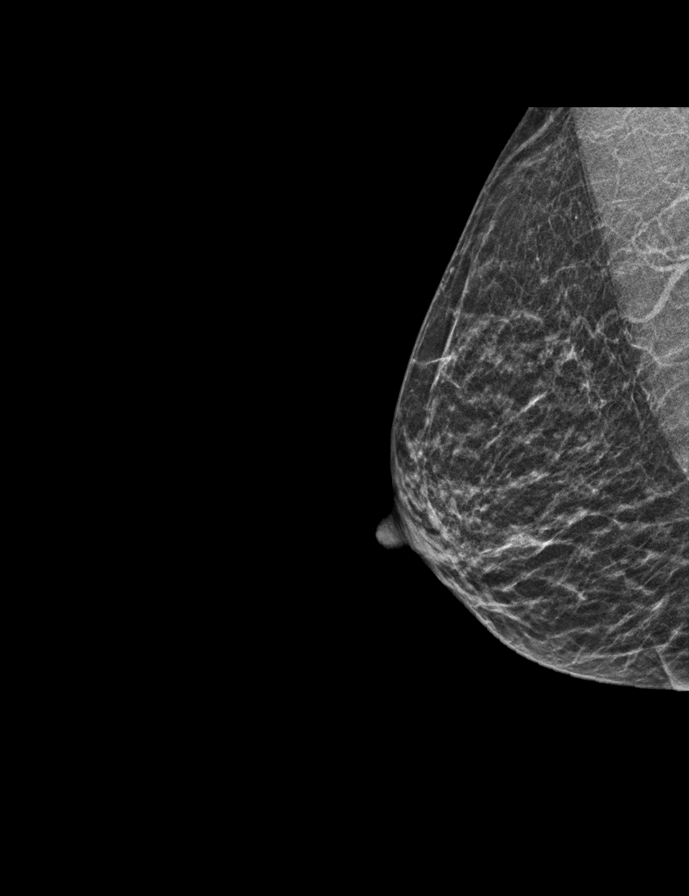

[L CC tomo · tomo slice 15/28.0]
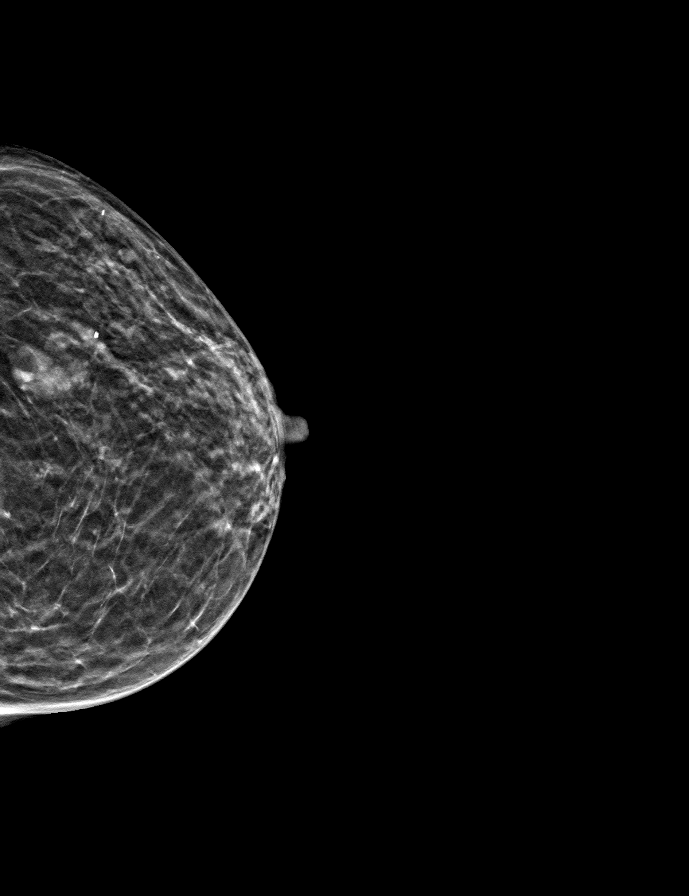

[L MLO tomo · tomo slice 17/34.0]
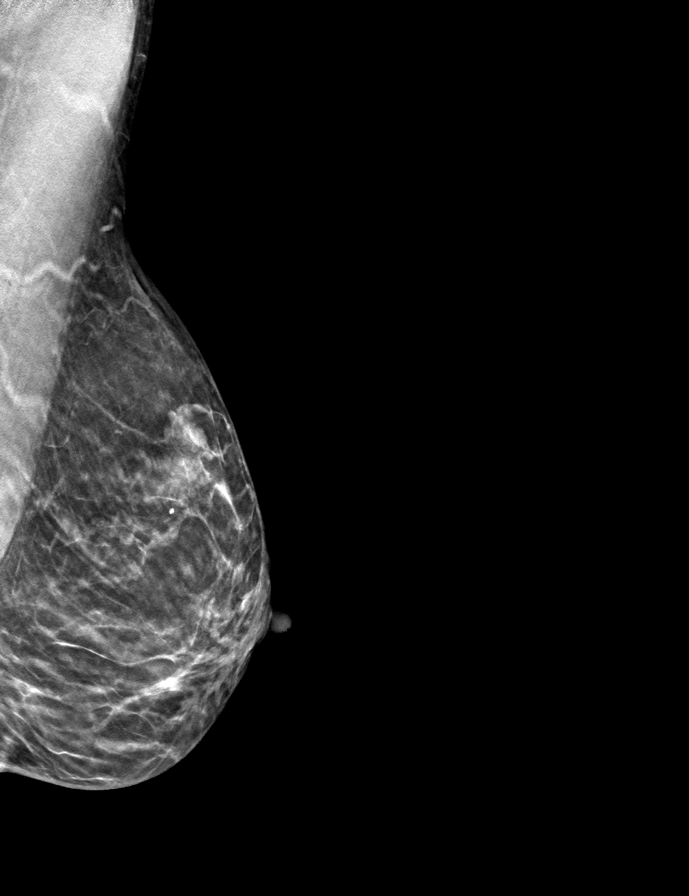

[R MLO tomo · tomo slice 15/28.0]
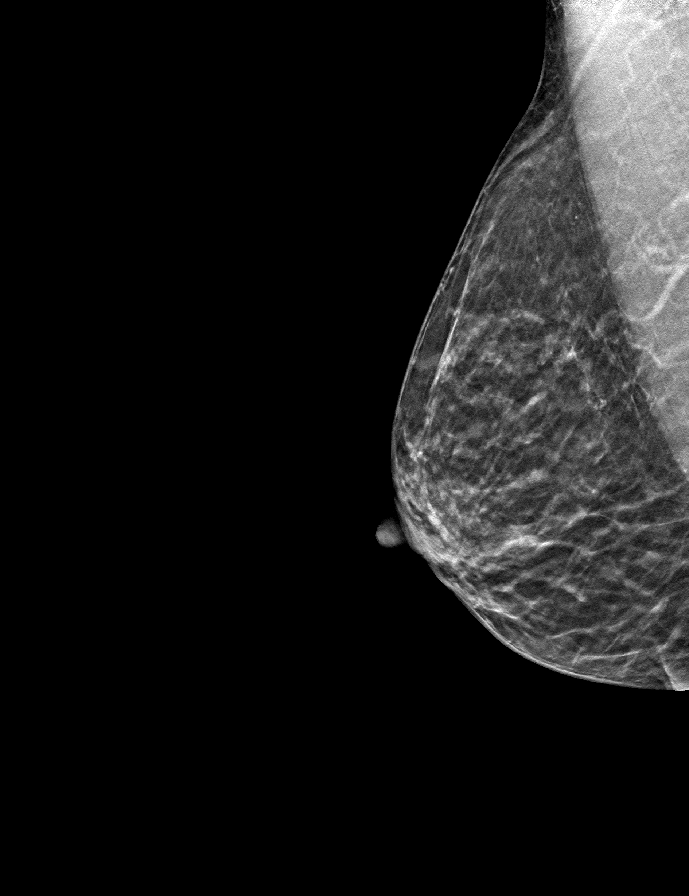

[R CC tomo · tomo slice 16/31.0]
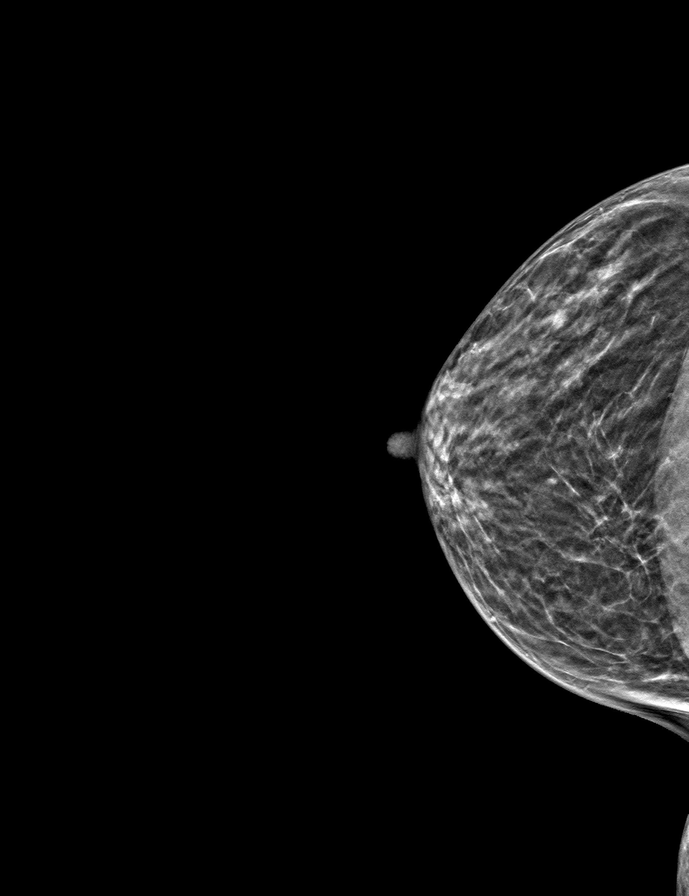

[8 of 24 positions shown; findings below may reference images not displayed]

Comparison is made to chest CT
angiogram dated 04/09/2021.

ACR Breast Density Category b: There are scattered areas of
fibroglandular density.
FINDINGS: On today's additional diagnostic views, there is an asymmetry within
the upper LEFT breast, at posterior depth, most suggestive of an
island of normal dense fibroglandular tissue or benign hamartoma.
Ultrasound will be performed to ensure benignity.

Targeted ultrasound is performed, evaluating the upper LEFT breast,
showing only normal fibroglandular tissues and fat lobules
throughout the upper LEFT breast. There is a benign intramammary
lymph node within the LEFT breast at the 1 o'clock axis, 9 cm from
the nipple, measuring 5 mm, likely contributing to the CT findings.
No suspicious solid or cystic mass is identified by ultrasound.
IMPRESSION: 1. No evidence of malignancy within either breast.
2. Island of normal dense fibroglandular tissue versus benign
hamartoma within the upper LEFT breast, and benign intramammary
lymph node within the LEFT breast at the 1 o'clock axis,
corresponding to the CT appearance.

Patient may return to routine annual bilateral screening mammogram
schedule.

RECOMMENDATION:
Screening mammogram in one year.(Code:VY-Q-3Z3)

I have discussed the findings and recommendations with the patient.
If applicable, a reminder letter will be sent to the patient
regarding the next appointment.

BI-RADS CATEGORY  2: Benign.

## 2022-10-24 IMAGING — US US BREAST*L* LIMITED INC AXILLA
1 series · 5 of 5 positions shown · non-contrast
Comparison: No previous mammogram.

CLINICAL DATA: Patient presents today for further characterization
of a possible 9 mm mass identified within the upper LEFT breast on
chest CT angiogram of 04/09/2021.

EXAM:
DIGITAL DIAGNOSTIC BILATERAL MAMMOGRAM WITH TOMOSYNTHESIS AND CAD;
ULTRASOUND LEFT BREAST LIMITED
TECHNIQUE: Bilateral digital diagnostic mammography and breast tomosynthesis
was performed. The images were evaluated with computer-aided
detection.; Targeted ultrasound examination of the left breast was
performed.

[Series 1: us breast*left* limited inc axilla · 0.06mm/px · 5 of 5 slices shown]
[im 1/5]
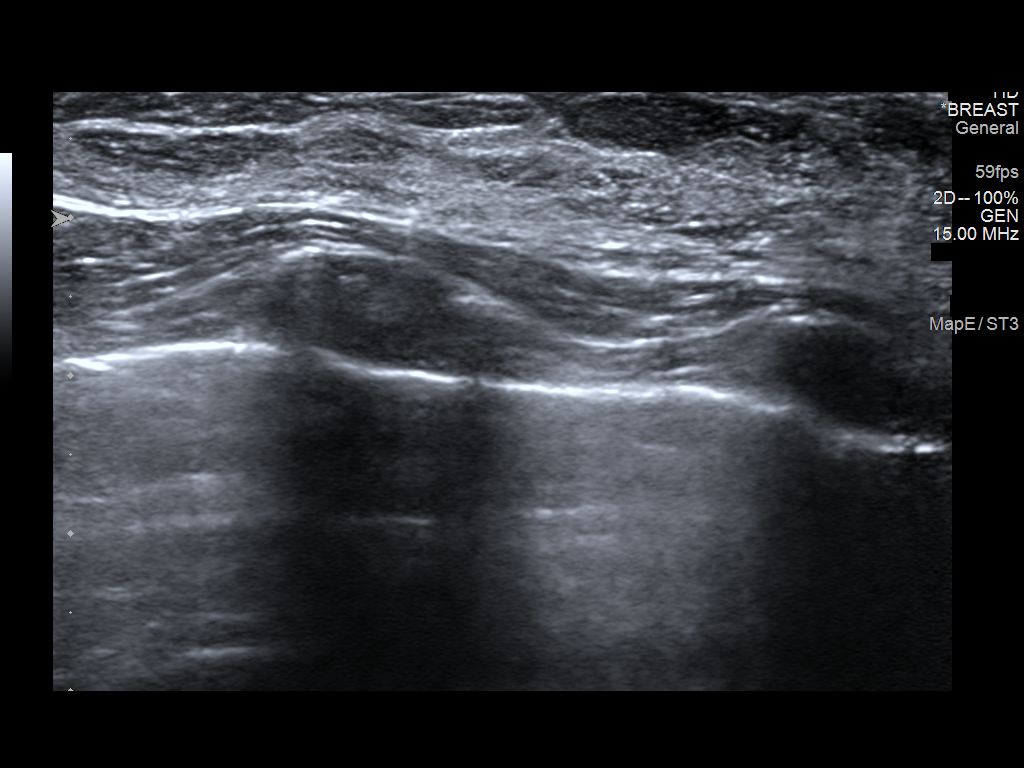
[im 2/5]
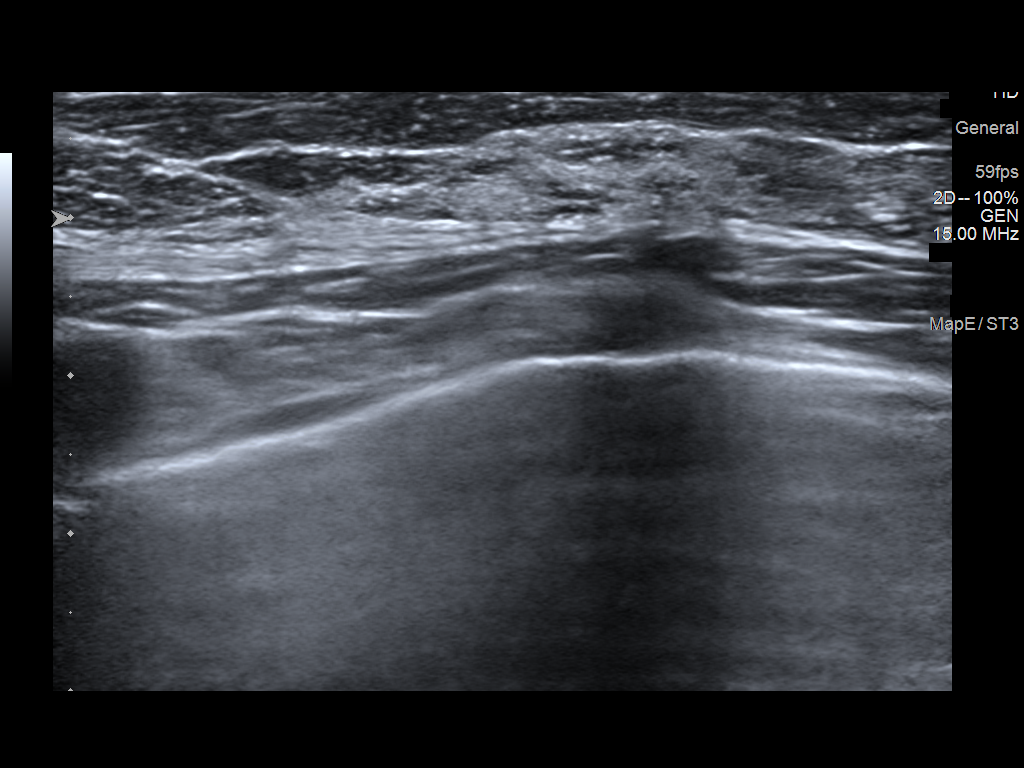
[im 3/5]
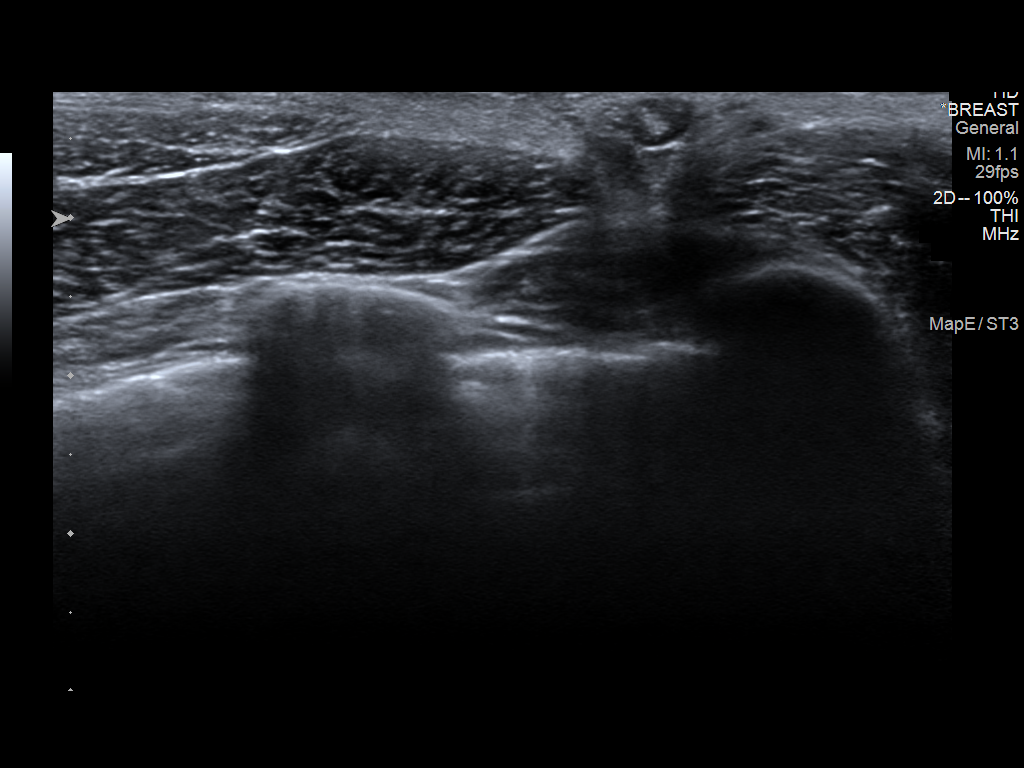
[im 4/5]
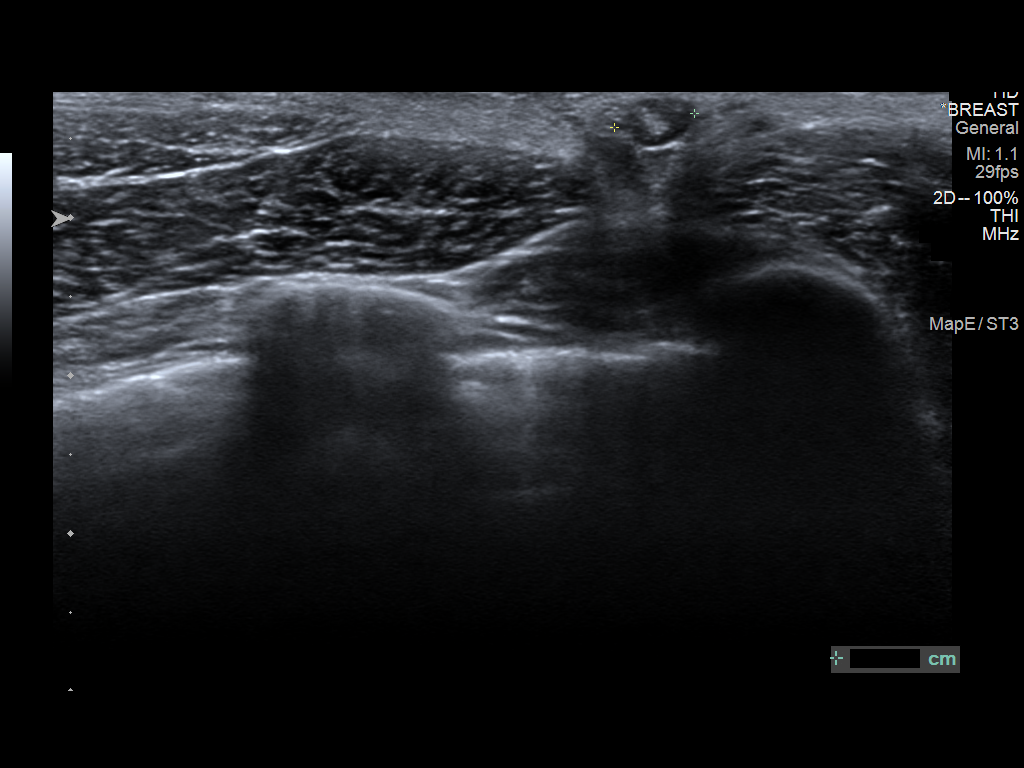
[im 5/5]
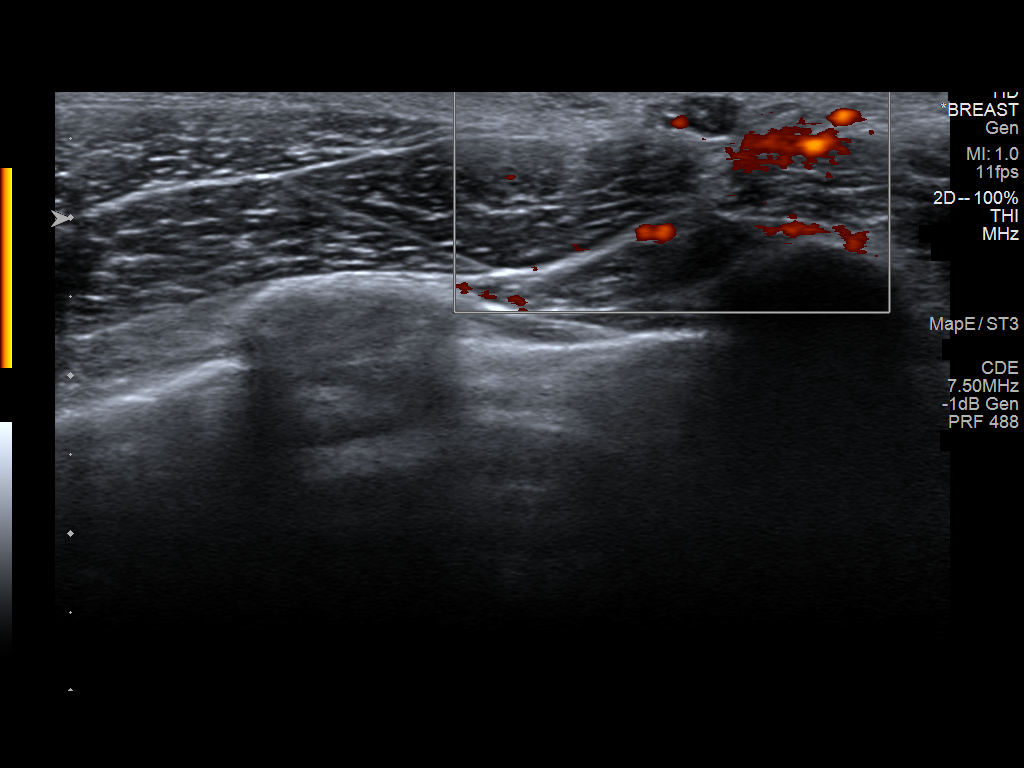

[5 of 5 positions shown; findings below may reference images not displayed]

Comparison is made to chest CT
angiogram dated 04/09/2021.

ACR Breast Density Category b: There are scattered areas of
fibroglandular density.
FINDINGS: On today's additional diagnostic views, there is an asymmetry within
the upper LEFT breast, at posterior depth, most suggestive of an
island of normal dense fibroglandular tissue or benign hamartoma.
Ultrasound will be performed to ensure benignity.

Targeted ultrasound is performed, evaluating the upper LEFT breast,
showing only normal fibroglandular tissues and fat lobules
throughout the upper LEFT breast. There is a benign intramammary
lymph node within the LEFT breast at the 1 o'clock axis, 9 cm from
the nipple, measuring 5 mm, likely contributing to the CT findings.
No suspicious solid or cystic mass is identified by ultrasound.
IMPRESSION: 1. No evidence of malignancy within either breast.
2. Island of normal dense fibroglandular tissue versus benign
hamartoma within the upper LEFT breast, and benign intramammary
lymph node within the LEFT breast at the 1 o'clock axis,
corresponding to the CT appearance.

Patient may return to routine annual bilateral screening mammogram
schedule.

RECOMMENDATION:
Screening mammogram in one year.(Code:VY-Q-3Z3)

I have discussed the findings and recommendations with the patient.
If applicable, a reminder letter will be sent to the patient
regarding the next appointment.

BI-RADS CATEGORY  2: Benign.

## 2022-11-12 ENCOUNTER — Other Ambulatory Visit: Payer: Self-pay

## 2022-11-12 ENCOUNTER — Emergency Department (EMERGENCY_DEPARTMENT_HOSPITAL)
Admission: EM | Admit: 2022-11-12 | Discharge: 2022-11-13 | Disposition: A | Discharge status: Other Acute Inpatient Hospital | Payer: Medicare Other | Source: Home / Self Care | Attending: Emergency Medicine | Admitting: Emergency Medicine

## 2022-11-12 ENCOUNTER — Emergency Department (HOSPITAL_COMMUNITY): Payer: Medicare Other

## 2022-11-12 ENCOUNTER — Ambulatory Visit (HOSPITAL_COMMUNITY)
Admission: RE | Admit: 2022-11-12 | Discharge: 2022-11-12 | Disposition: A | Discharge status: Home / Self Care | Payer: Medicare Other | Attending: Psychiatry | Admitting: Psychiatry

## 2022-11-12 ENCOUNTER — Emergency Department (HOSPITAL_COMMUNITY)
Admission: EM | Admit: 2022-11-12 | Discharge: 2022-11-12 | Disposition: A | Discharge status: Home / Self Care | Payer: Medicare Other | Attending: Emergency Medicine | Admitting: Emergency Medicine

## 2022-11-12 DIAGNOSIS — Z1152 Encounter for screening for COVID-19: Secondary | ICD-10-CM | POA: Insufficient documentation

## 2022-11-12 DIAGNOSIS — F250 Schizoaffective disorder, bipolar type: Secondary | ICD-10-CM | POA: Diagnosis present

## 2022-11-12 DIAGNOSIS — R45851 Suicidal ideations: Secondary | ICD-10-CM | POA: Insufficient documentation

## 2022-11-12 DIAGNOSIS — F1420 Cocaine dependence, uncomplicated: Secondary | ICD-10-CM | POA: Diagnosis present

## 2022-11-12 DIAGNOSIS — Z7982 Long term (current) use of aspirin: Secondary | ICD-10-CM | POA: Insufficient documentation

## 2022-11-12 DIAGNOSIS — Z046 Encounter for general psychiatric examination, requested by authority: Secondary | ICD-10-CM | POA: Insufficient documentation

## 2022-11-12 DIAGNOSIS — W19XXXD Unspecified fall, subsequent encounter: Secondary | ICD-10-CM | POA: Insufficient documentation

## 2022-11-12 DIAGNOSIS — F1220 Cannabis dependence, uncomplicated: Secondary | ICD-10-CM | POA: Diagnosis present

## 2022-11-12 DIAGNOSIS — Z79899 Other long term (current) drug therapy: Secondary | ICD-10-CM | POA: Insufficient documentation

## 2022-11-12 DIAGNOSIS — F17210 Nicotine dependence, cigarettes, uncomplicated: Secondary | ICD-10-CM | POA: Insufficient documentation

## 2022-11-12 DIAGNOSIS — S8261XD Displaced fracture of lateral malleolus of right fibula, subsequent encounter for closed fracture with routine healing: Secondary | ICD-10-CM | POA: Diagnosis not present

## 2022-11-12 DIAGNOSIS — S8991XD Unspecified injury of right lower leg, subsequent encounter: Secondary | ICD-10-CM | POA: Diagnosis present

## 2022-11-12 DIAGNOSIS — Z20822 Contact with and (suspected) exposure to covid-19: Secondary | ICD-10-CM | POA: Insufficient documentation

## 2022-11-12 DIAGNOSIS — S82891D Other fracture of right lower leg, subsequent encounter for closed fracture with routine healing: Secondary | ICD-10-CM

## 2022-11-12 LAB — CBC
HCT: 43.7 % (ref 36.0–46.0)
Hemoglobin: 13.9 g/dL (ref 12.0–15.0)
MCH: 29.5 pg (ref 26.0–34.0)
MCHC: 31.8 g/dL (ref 30.0–36.0)
MCV: 92.8 fL (ref 80.0–100.0)
Platelets: 225 10*3/uL (ref 150–400)
RBC: 4.71 MIL/uL (ref 3.87–5.11)
RDW: 14.6 % (ref 11.5–15.5)
WBC: 4.7 10*3/uL (ref 4.0–10.5)
nRBC: 0 % (ref 0.0–0.2)

## 2022-11-12 LAB — COMPREHENSIVE METABOLIC PANEL
ALT: 17 U/L (ref 0–44)
AST: 22 U/L (ref 15–41)
Albumin: 3.6 g/dL (ref 3.5–5.0)
Alkaline Phosphatase: 58 U/L (ref 38–126)
Anion gap: 6 (ref 5–15)
BUN: 20 mg/dL (ref 6–20)
CO2: 26 mmol/L (ref 22–32)
Calcium: 8.8 mg/dL — ABNORMAL LOW (ref 8.9–10.3)
Chloride: 108 mmol/L (ref 98–111)
Creatinine, Ser: 0.83 mg/dL (ref 0.44–1.00)
GFR, Estimated: >60 mL/min (ref >60)
Glucose, Bld: 124 mg/dL — ABNORMAL HIGH (ref 70–99)
Potassium: 3.9 mmol/L (ref 3.5–5.1)
Sodium: 140 mmol/L (ref 135–145)
Total Bilirubin: 0.4 mg/dL (ref 0.3–1.2)
Total Protein: 6.4 g/dL — ABNORMAL LOW (ref 6.5–8.1)

## 2022-11-12 LAB — ETHANOL: Alcohol, Ethyl (B): <10 mg/dL (ref <10)

## 2022-11-12 MED ORDER — ACETAMINOPHEN 325 MG PO TABS
650.0000 mg | ORAL_TABLET | Freq: Four times a day (QID) | ORAL | Status: DC | PRN
  Administered 2022-11-12: 650 mg via ORAL
  Filled 2022-11-12: qty 2

## 2022-11-12 MED ORDER — QUETIAPINE FUMARATE 50 MG PO TABS: 50.0000 mg | ORAL_TABLET | Freq: Every day | ORAL | Status: DC

## 2022-11-12 MED ORDER — HYDROXYZINE HCL 25 MG PO TABS
25.0000 mg | ORAL_TABLET | Freq: Three times a day (TID) | ORAL | Status: DC | PRN
  Administered 2022-11-12: 25 mg via ORAL
  Filled 2022-11-12: qty 1

## 2022-11-12 MED ORDER — QUETIAPINE FUMARATE 100 MG PO TABS
200.0000 mg | ORAL_TABLET | Freq: Every day | ORAL | Status: DC
  Administered 2022-11-12: 200 mg via ORAL
  Filled 2022-11-12: qty 2

## 2022-11-12 NOTE — Consult Note (Signed)
Attempted to see patient for psychiatric assessment via tts cart.  No nurse assigned to patient.  Reached out to Dr. Rubin Payor who states he will see what he can do assist in getting patient set up for assessment.

## 2022-11-12 NOTE — ED Provider Notes (Signed)
[facility] DEPT Provider Note   CSN: [id number] Arrival date & time: 11/12/22  [id number]     History  Chief Complaint  Patient presents with   Foot Injury    [name] is a 61 y.o. female who presents for evaluation of right foot pain.  I personally saw this patient on 09/30/2022 after a fall.  She had a fracture of the right ankle showing displaced lateral malleolus with mild widening of the ankle mortise and fracture of the fifth metatarsal.  Patient was placed in a stirrup and posterior splint that day and given crutches.  She states that she followed up in office with Dr. Lucia Gaskins where she was placed in a cast and given crutches that were "too small."  She states that the cast has fallen apart and she was "not very happy with my care."  She returns today because she continues to have pain in the right foot and has been unable to use her crutches.  Her cast is literally disintegrating and she wants it reevaluated.  She denies any new numbness or tingling.   Foot Injury      Home Medications Prior to Admission medications   Medication Sig Start Date End Date Taking? Authorizing Provider  aspirin 81 MG chewable tablet Chew 81 mg by mouth daily.    [provider]  Ensure (ENSURE) Take 237 mLs by mouth daily as needed.    [provider]  famotidine (PEPCID) 20 MG tablet Take 1 tablet (20 mg total) by mouth 2 (two) times daily. 07/21/22   Jacqlyn Larsen, PA-C  Multiple Vitamins-Minerals (ONE-A-DAY WOMENS 50+) TABS Take 1 tablet by mouth daily.    [provider]  naproxen (NAPROSYN) 375 MG tablet Take 1 tablet (375 mg total) by mouth 2 (two) times daily with a meal. 09/30/22   Rachel Samples, PA-C  ondansetron (ZOFRAN) 4 MG tablet Take 1 tablet (4 mg total) by mouth every 6 (six) hours. 07/21/22   Jacqlyn Larsen, PA-C  oxyCODONE-acetaminophen (PERCOCET/ROXICET) 5-325 MG tablet Take 1 tablet by mouth every 6 (six) hours as needed  for severe pain. 09/30/22   Rhylen Pulido, PA-C  QUEtiapine (SEROQUEL) 200 MG tablet Take 200 mg by mouth at bedtime. 04/30/20   [provider]      Allergies    Patient has no known allergies.    Review of Systems   Review of Systems  Physical Exam Updated Vital Signs BP (!) 126/52 (BP Location: Left Arm)   Pulse (!) 112   Temp (!) 97.4 F (36.3 C) (Oral)   Resp 14   Ht 5' (1.524 m)   Wt 49 kg   SpO2 100%   BMI 21.10 kg/m  Physical Exam Vitals and nursing note reviewed.  Constitutional:      General: She is not in acute distress.    Appearance: She is well-developed. She is not diaphoretic.  HENT:     Head: Normocephalic and atraumatic.     Right Ear: External ear normal.     Left Ear: External ear normal.     Nose: Nose normal.     Mouth/Throat:     Mouth: Mucous membranes are moist.  Eyes:     General: No scleral icterus.    Conjunctiva/sclera: Conjunctivae normal.  Cardiovascular:     Rate and Rhythm: Normal rate and regular rhythm.     Heart sounds: Normal heart sounds. No murmur heard.    No friction rub. No gallop.  Pulmonary:     Effort: Pulmonary effort is normal. No respiratory distress.     Breath sounds: Normal breath sounds.  Abdominal:     General: Bowel sounds are normal. There is no distension.     Palpations: Abdomen is soft. There is no mass.     Tenderness: There is no abdominal tenderness. There is no guarding.  Musculoskeletal:     Cervical back: Normal range of motion.     Comments: Right foot with cast that extends to the middle part of the foot.  There is disintegrating cast material all around.  Skin:    General: Skin is warm and dry.  Neurological:     Mental Status: She is alert and oriented to person, place, and time.  Psychiatric:        Behavior: Behavior normal.     ED Results / Procedures / Treatments   Labs (all labs ordered are listed, but only abnormal results are displayed) Labs Reviewed - No data to  display  EKG None  Radiology CT Ankle Right Wo Contrast  Result Date: 11/12/2022 CLINICAL DATA:  Left ankle fracture after fall on 09/30/2022 EXAM: CT OF THE RIGHT ANKLE WITHOUT CONTRAST TECHNIQUE: Multidetector CT imaging of the right ankle was performed according to the standard protocol. Multiplanar CT image reconstructions were also generated. RADIATION DOSE REDUCTION: This exam was performed according to the departmental dose-optimization program which includes automated exposure control, adjustment of the mA and/or kV according to patient size and/or use of iterative reconstruction technique. COMPARISON:  X-ray 09/30/2022, 11/12/2022 FINDINGS: Bones/Joint/Cartilage Subacute essentially nondisplaced transverse fracture through the lateral malleolus. No bridging bone formation across the fracture site. Subacute nondisplaced extra-articular fracture of the fifth metatarsal base without bridging bone formation across the fracture site. Subacute or chronic nondisplaced fracture of the medial tubercle of the talus (series 9, image 38). No additional fractures. Distal tibia intact. Ankle mortise is congruent. No dislocation. Joint spaces are preserved. Bipartite accessory navicular. Ligaments Suboptimally assessed by CT. Muscles and Tendons Musculotendinous structures within normal limits by CT. Soft tissues Mild soft tissue swelling at the lateral aspect of the ankle. IMPRESSION: 1. Subacute essentially nondisplaced fractures of the lateral malleolus and fifth metatarsal base. 2. Subacute or chronic nondisplaced fracture of the medial tubercle of the talus. Electronically Signed   By: Duanne Guess D.O.   On: 11/12/2022 12:03   DG Ankle Complete Right  Result Date: 11/12/2022 CLINICAL DATA:  The patient fell and fractured her ankle September 30, 2022. Soft cast applied. Patient has not followed up with orthopedics. Soft cast came off at home. Patient needs cast reapplied. EXAM: RIGHT ANKLE - COMPLETE  3+ VIEW COMPARISON:  September 30, 2022 FINDINGS: The patient's distal fibular fracture is again identified. Lucency remains at the fracture site. There is an apparent fracture fragment arising from the medial aspect of the talus on the frontal view, not described previously. The known fracture of the base of the fifth metatarsal remains. Lucency remains at the fracture site. No new fractures are identified. Soft tissue swelling remains laterally, improved in the interval. The ankle mortise is intact. IMPRESSION: 1. The known distal fibular fracture is again identified. Lucency remains at the fracture site. 2. The known fracture of the base of the fifth metatarsal remains. Lucency remains at the fracture site. 3. There is an apparent fracture fragment arising from the medial aspect of the talus on the frontal view, not described previously. Recommend orthopedic consultation. Consider CT imaging for complete evaluation  of the patient's ankle fracture/fractures given the apparent talar fragment. Electronically Signed   By: Dorise Bullion III M.D.   On: 11/12/2022 10:55    Procedures Procedures    Medications Ordered in ED Medications - No data to display  ED Course/ Medical Decision Making/ A&P Clinical Course as of 11/12/22 1722  Sun Nov 12, 2022  1113 DG Ankle Complete Right I visualized and interpreted the patient's right ankle from.  There is new fracture identified and I have ordered a CT as indicated by radiologist suggestion. [AH]  1218 I visualized patient's CT which shows essentially well aligned subacute fractures of the distal fibula and proximal fifth metatarsal.  These appear to have callus formation.  I discussed care with Dr. Lajean Saver who recommends cam walker, crutches and close follow up. [AH]    Clinical Course User Index [AH] Margarita Mail, PA-C                           Medical Decision Making Amount and/or Complexity of Data Reviewed Radiology: ordered. Decision-making  details documented in ED Course.   Patient with foot fracture- healing. Mdm per ed course F/u and return precautions        Final Clinical Impression(s) / ED Diagnoses Final diagnoses:  Closed fracture of right ankle with routine healing, subsequent encounter    Rx / DC Orders ED Discharge Orders     None         Margarita Mail, PA-C 11/12/22 1723    Lajean Saver, MD 11/14/22 2025

## 2022-11-12 NOTE — ED Notes (Signed)
Patient ambulated to the restroom with no difficulty, patient forgot to obtain urine sample. Reminded for the next time.

## 2022-11-12 NOTE — Progress Notes (Signed)
Pt.presented as walk-in from the [facility] ED, c/o anxiety, depression. Denied suicidal ideation or plan on initial inquiry.. Pt. was evaluated by nurse practitioner and was advised that she did not meet criteria for admission at present. Pt presented with information regarding outpatient services but insisted she needed "to check in.". Expressed frustration and anger and began making vague threats of self harm. Pt. was provided with a meal after she indicated that she was hungry and advised to go to the emergency room if feeling suicidal.

## 2022-11-12 NOTE — ED Triage Notes (Signed)
C/o Si without plan.  Denies A/V hallucinations Denis SI.
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
## 2022-11-12 NOTE — ED Triage Notes (Signed)
C/o fx to right foot on 10/11 and soft cast applied. Pt returns today saying she hasn't followed up with orthopedics yet and soft cast has come off and needs it reapplied.  Denies pain.
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
## 2022-11-12 NOTE — ED Notes (Signed)
Pt advised she already has crutches and refused current set offered

## 2022-11-12 NOTE — Discharge Instructions (Signed)
Get help right away if: You have numbness in your foot or leg. The skin on your foot or leg is cold, blue, or gray.

## 2022-11-12 NOTE — ED Notes (Signed)
Patient changed into scrubs and wanded by security. Two labeled patient belongings bags and one labeled suitcase secured at triage nurse's station
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
## 2022-11-13 ENCOUNTER — Ambulatory Visit (HOSPITAL_COMMUNITY)
Admission: EM | Admit: 2022-11-13 | Discharge: 2022-11-13 | Disposition: A | Discharge status: Home / Self Care | Payer: Medicare Other | Attending: Urology | Admitting: Urology

## 2022-11-13 DIAGNOSIS — F32A Depression, unspecified: Secondary | ICD-10-CM | POA: Diagnosis not present

## 2022-11-13 DIAGNOSIS — F1220 Cannabis dependence, uncomplicated: Secondary | ICD-10-CM | POA: Diagnosis not present

## 2022-11-13 DIAGNOSIS — F250 Schizoaffective disorder, bipolar type: Secondary | ICD-10-CM | POA: Diagnosis not present

## 2022-11-13 DIAGNOSIS — F17210 Nicotine dependence, cigarettes, uncomplicated: Secondary | ICD-10-CM | POA: Insufficient documentation

## 2022-11-13 DIAGNOSIS — F1420 Cocaine dependence, uncomplicated: Secondary | ICD-10-CM

## 2022-11-13 DIAGNOSIS — F332 Major depressive disorder, recurrent severe without psychotic features: Secondary | ICD-10-CM | POA: Diagnosis present

## 2022-11-13 DIAGNOSIS — R45851 Suicidal ideations: Secondary | ICD-10-CM | POA: Insufficient documentation

## 2022-11-13 LAB — SARS CORONAVIRUS 2 BY RT PCR: SARS Coronavirus 2 by RT PCR: NEGATIVE

## 2022-11-13 MED ORDER — QUETIAPINE FUMARATE 50 MG PO TABS
50.0000 mg | ORAL_TABLET | Freq: Every day | ORAL | Status: DC
  Administered 2022-11-13: 50 mg via ORAL
  Filled 2022-11-13: qty 1
  Filled 2022-11-13: qty 35

## 2022-11-13 MED ORDER — HYDROXYZINE HCL 25 MG PO TABS: 25.0000 mg | ORAL_TABLET | Freq: Three times a day (TID) | ORAL | Status: DC | PRN

## 2022-11-13 MED ORDER — NICOTINE 7 MG/24HR TD PT24
7.0000 mg | MEDICATED_PATCH | Freq: Every day | TRANSDERMAL | Status: DC
  Filled 2022-11-13: qty 7

## 2022-11-13 MED ORDER — NICOTINE 7 MG/24HR TD PT24: 7.0000 mg | MEDICATED_PATCH | Freq: Every day | TRANSDERMAL | 0 refills | Status: DC

## 2022-11-13 MED ORDER — QUETIAPINE FUMARATE 200 MG PO TABS: 200.0000 mg | ORAL_TABLET | Freq: Every day | ORAL | Status: DC

## 2022-11-13 MED ORDER — QUETIAPINE FUMARATE 200 MG PO TABS: 200.0000 mg | ORAL_TABLET | Freq: Every day | ORAL | 0 refills | Status: DC

## 2022-11-13 MED ORDER — ALUM & MAG HYDROXIDE-SIMETH 200-200-20 MG/5ML PO SUSP: 30.0000 mL | ORAL | Status: DC | PRN

## 2022-11-13 MED ORDER — PANTOPRAZOLE SODIUM 40 MG PO TBEC
40.0000 mg | DELAYED_RELEASE_TABLET | Freq: Every day | ORAL | Status: DC
  Administered 2022-11-13: 40 mg via ORAL
  Filled 2022-11-13: qty 1

## 2022-11-13 MED ORDER — TRAZODONE HCL 50 MG PO TABS: 50.0000 mg | ORAL_TABLET | Freq: Every evening | ORAL | Status: DC | PRN

## 2022-11-13 MED ORDER — QUETIAPINE FUMARATE 50 MG PO TABS: 50.0000 mg | ORAL_TABLET | Freq: Every day | ORAL | 0 refills | Status: DC

## 2022-11-13 MED ORDER — ACETAMINOPHEN 325 MG PO TABS: 650.0000 mg | ORAL_TABLET | Freq: Four times a day (QID) | ORAL | Status: DC | PRN

## 2022-11-13 MED ORDER — MAGNESIUM HYDROXIDE 400 MG/5ML PO SUSP: 30.0000 mL | Freq: Every day | ORAL | Status: DC | PRN

## 2022-11-13 NOTE — BH Assessment (Signed)
LCSW Progress Note   Per Lamar Sprinkles, MD, this pt does not require psychiatric hospitalization at this time.  Pt is psychiatrically cleared.  Discharge instructions include resources for transition to community living with [facility], contact information for the [facility]), and providers offering ACTT services. A referral to [employer] Transition to [employer] program was faxed and made online.  A referral to PHP/IOP was sent. EDP Lamar Sprinkles, MD, has been notified.  Hansel Starling, MSW, LCSW [facility] [phone] or [phone]

## 2022-11-13 NOTE — ED Notes (Signed)
Safe transport called, will arrive in 15 minutes.

## 2022-11-13 NOTE — Progress Notes (Signed)
Discharge instructions reviewed with patient. AVS given and RX given as well as free supply of medications. Pt aware of follow up appointments and referrals and verbalized understanding. Bus pass given and all belongings returned.

## 2022-11-13 NOTE — ED Notes (Signed)
Pt awoken this am to accept medications. She is not forthcoming at this time with writer but denies any acute concerns and states '' I just want to get back on my medications. '' She denies any SI or HI or A/V Hallucinations at this time. She has been able to make her needs known, accepted coffee, muffin for breakfast. Pt is safe, will con't to monitor.

## 2022-11-13 NOTE — ED Notes (Signed)
Spoke with charge nurse in [place] regarding getting a COVID test done and sending patient over to [facility] - will follow up

## 2022-11-13 NOTE — ED Notes (Signed)
Patient is 60 y.o. black female with history of schizophrenia and bipolar disorder. Patient was transferred from [facility] for observation due to suicidal ideations with plan to overdose of pills she had. Patient is Alert & oriented x 4. Patient denies SI/HI and AVH at this time. Patient has a cam walker offloading boot on her right foot with fracture. Pt denies physical pain at the moment. Skin assessment completed and patient transferred to the unit. Patient now in bed appears asleep. Support and encouragement provided. Routine safety checks conducted according to facility protocol. Encouraged patient to notify staff if thoughts of harm toward self or others arise. Patient verbalize understanding and agreement. Will continue to monitor for safety.
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
## 2022-11-13 NOTE — ED Notes (Signed)
Spoke with RN and given phone number to call report to

## 2022-11-13 NOTE — Discharge Instructions (Addendum)
Follow-up recommendations:  Activity:  Normal, as tolerated Diet:  Per PCP recommendation  Patient is instructed prior to discharge to: Take all medications as prescribed by her mental healthcare provider. Report any adverse effects and/or reactions from the medicines to her outpatient provider promptly. Patient has been instructed & cautioned: To not engage in alcohol and or illegal drug use while on prescription medicines.  In the event of worsening symptoms, patient is instructed to call the crisis hotline at 988, 911 and or go to the nearest ED for appropriate evaluation and treatment of symptoms. To follow-up with her primary care provider for your other medical issues, concerns and or health care needs.   You are scheduled for an assessment for the PHP on 11/14/22 @ 10a. This appointment will last approximately one hour and will be virtual via Webex. PHP is virtual group therapy that runs Mon-Fri from 9am-1pm. Please download the [employer] app prior to the appointment. If you need to cancel or reschedule, please call [phone].  ___________________________________________________________________________________________________________________________ The PDF/Word versions of Social Serve are simply back up methods for individuals that do not have internet access and are not able to submit the referral online.  You can submit a RSVP referral yourself online for [employer] which allows you to get the RSVP ID # immediately.  The RSVP ID # is the # that has replaced the previous [facility] PASRR ID #.  I would ask that you go to the online RSVP portal website to submit your referral for the individuals. [email or web address]   Once you submit the referral online through the RSVP portal, you will receive an RSVP ID # on the final submission page.  Once the referral is submitted, it will go straight to the  [facility] or Division of Mental Health and  we will be contacting you to obtain further information to verify eligibility. Please fax the supporting documentation (FL2, Clinical Assessment or etc.) to verify diagnosis.  Additionally, if you would like more information regarding TCLI and RSVP (including a webinar and FAQ) please visit our Department of Health and Human Services website at:  [email or web address].     Social Serve is used only for submitting RSVP Referrals.     If you have any further questions, please email me.     Ladona Horns  IDD, TBI & Dot Been Section  Division of Mental Health, Developmental Disabilities and Substance Use Services  [place] [facility] and [employer]     (Office) [phone]  (Fax) [phone] (RSVP eFax)  [email or web address]     [address] [place] [address]  [address] [place], [place], [place] [address]   A referral was made on your behalf, but be sure to follow up if you have not heard back from them in a week or two.  [phone] _________________________________________________________________________________________________________________________ [name] also have the option of calling the [facility] @ [phone] phone, who work "to empowering individuals with disability to create and achieve goals" by teaching others how to live independently, "giving them a supportive setting through peer counseling, assist with transitions" and educate the community.  These services are FREE.  Services offered are Automotive engineer, Institutional and Youth Transitions Support, Peer Counseling, Systems and Individual Advocacy, and Information and Referrals.    Their hours of operation are 8:30am - 4:30pm.  [facility]) [phone] [place], [place], [address]   For your behavioral health needs, you are advised to follow up with an outpatient  provider.  You may be eligible for ACT Team services, which would include more frequent visits with your provider, as well as in-home services.  The following providers offer ACT Team services.  Contact them at your earliest opportunity to ask about enrolling in their program:       [facility]      [address]      [place], [place] [address]      [phone] [address]      [place], [place] [address]      [phone]       [facility]      [address]      [place], [place] [address]      [phone]       [facility] ACT Team      [address] [facility] [address], [address]      [address]      [place], [place]  [address]      [phone]       [facility]      [address]      [place], [place] [address]      [phone]

## 2022-11-14 ENCOUNTER — Ambulatory Visit (HOSPITAL_COMMUNITY): Payer: Medicare Other

## 2022-11-14 ENCOUNTER — Telehealth (HOSPITAL_COMMUNITY): Payer: Self-pay | Admitting: Professional

## 2023-08-25 ENCOUNTER — Encounter (HOSPITAL_COMMUNITY): Payer: Self-pay

## 2023-08-25 ENCOUNTER — Emergency Department (HOSPITAL_COMMUNITY): Payer: 59

## 2023-08-25 ENCOUNTER — Emergency Department (HOSPITAL_COMMUNITY)
Admission: EM | Admit: 2023-08-25 | Discharge: 2023-08-25 | Disposition: A | Discharge status: Home / Self Care | Payer: 59 | Attending: Emergency Medicine | Admitting: Emergency Medicine

## 2023-08-25 DIAGNOSIS — R079 Chest pain, unspecified: Secondary | ICD-10-CM | POA: Diagnosis present

## 2023-08-25 DIAGNOSIS — R0789 Other chest pain: Secondary | ICD-10-CM | POA: Insufficient documentation

## 2023-08-25 DIAGNOSIS — T781XXA Other adverse food reactions, not elsewhere classified, initial encounter: Secondary | ICD-10-CM | POA: Insufficient documentation

## 2023-08-25 DIAGNOSIS — Z7982 Long term (current) use of aspirin: Secondary | ICD-10-CM | POA: Diagnosis not present

## 2023-08-25 DIAGNOSIS — R202 Paresthesia of skin: Secondary | ICD-10-CM | POA: Diagnosis not present

## 2023-08-25 LAB — CBC WITH DIFFERENTIAL/PLATELET
Abs Immature Granulocytes: 0.03 10*3/uL (ref 0.00–0.07)
Basophils Absolute: 0 10*3/uL (ref 0.0–0.1)
Basophils Relative: 1 %
Eosinophils Absolute: 0.3 10*3/uL (ref 0.0–0.5)
Eosinophils Relative: 8 %
HCT: 46.4 % — ABNORMAL HIGH (ref 36.0–46.0)
Hemoglobin: 14.4 g/dL (ref 12.0–15.0)
Immature Granulocytes: 1 %
Lymphocytes Relative: 34 %
Lymphs Abs: 1.2 10*3/uL (ref 0.7–4.0)
MCH: 28.4 pg (ref 26.0–34.0)
MCHC: 31 g/dL (ref 30.0–36.0)
MCV: 91.5 fL (ref 80.0–100.0)
Monocytes Absolute: 0.4 10*3/uL (ref 0.1–1.0)
Monocytes Relative: 12 %
Neutro Abs: 1.6 10*3/uL — ABNORMAL LOW (ref 1.7–7.7)
Neutrophils Relative %: 44 %
Platelets: 214 10*3/uL (ref 150–400)
RBC: 5.07 MIL/uL (ref 3.87–5.11)
RDW: 14.7 % (ref 11.5–15.5)
WBC: 3.5 10*3/uL — ABNORMAL LOW (ref 4.0–10.5)
nRBC: 0 % (ref 0.0–0.2)

## 2023-08-25 LAB — COMPREHENSIVE METABOLIC PANEL
ALT: 24 U/L (ref 0–44)
AST: 26 U/L (ref 15–41)
Albumin: 3.3 g/dL — ABNORMAL LOW (ref 3.5–5.0)
Alkaline Phosphatase: 59 U/L (ref 38–126)
Anion gap: 11 (ref 5–15)
BUN: 19 mg/dL (ref 6–20)
CO2: 24 mmol/L (ref 22–32)
Calcium: 8.7 mg/dL — ABNORMAL LOW (ref 8.9–10.3)
Chloride: 106 mmol/L (ref 98–111)
Creatinine, Ser: 1.1 mg/dL — ABNORMAL HIGH (ref 0.44–1.00)
GFR, Estimated: 58 mL/min — ABNORMAL LOW (ref >60)
Glucose, Bld: 96 mg/dL (ref 70–99)
Potassium: 3.9 mmol/L (ref 3.5–5.1)
Sodium: 141 mmol/L (ref 135–145)
Total Bilirubin: 0.3 mg/dL (ref 0.3–1.2)
Total Protein: 6.1 g/dL — ABNORMAL LOW (ref 6.5–8.1)

## 2023-08-25 LAB — TROPONIN I (HIGH SENSITIVITY)
Troponin I (High Sensitivity): 5 ng/L (ref <18)
Troponin I (High Sensitivity): 4 ng/L (ref <18)

## 2023-08-25 LAB — MAGNESIUM: Magnesium: 2 mg/dL (ref 1.7–2.4)

## 2023-08-25 MED ORDER — SODIUM CHLORIDE 0.9 % IV BOLUS
500.0000 mL | Freq: Once | INTRAVENOUS | Status: AC
  Administered 2023-08-25: 500 mL via INTRAVENOUS

## 2023-08-25 NOTE — Discharge Instructions (Signed)
Your history, exam, and evaluation today are consistent with a likely reaction to the new soda or component of it that you ingested.  Your workup here was completely reassuring and we observed for over 6 hours without any recurrent reaction.  We feel you are safe for discharge home to follow-up with a primary doctor.  Please avoid the orange soda in the future.  If any symptoms recur, change, or worsen, please return to the nearest emergency department.

## 2023-08-25 NOTE — ED Provider Notes (Signed)
[facility] Provider Note   CSN: [id number] Arrival date & time: 08/25/23  7846     History  Chief Complaint  Patient presents with   Numbness   Tingling   Chest Pain    [name] is a 62 y.o. female.  The history is provided by the patient and medical records. No language interpreter was used.  Chest Pain Pain location:  R chest and L chest Pain quality: tightness   Pain radiates to:  Does not radiate Pain severity:  Moderate Onset quality:  Gradual Duration:  15 minutes Timing:  Sporadic Progression:  Resolved Chronicity:  New Context comment:  Afte rorange soda ingestionm Relieved by:  Nothing Ineffective treatments:  None tried Associated symptoms: fatigue and shortness of breath   Associated symptoms: no abdominal pain, no altered mental status, no back pain, no cough, no diaphoresis, no fever, no headache, no lower extremity edema, no nausea, no near-syncope, no numbness and no palpitations        Home Medications Prior to Admission medications   Medication Sig Start Date End Date Taking? Authorizing Provider  aspirin 81 MG chewable tablet Chew 81 mg by mouth daily.    [provider]  Ensure (ENSURE) Take 237 mLs by mouth daily as needed.    [provider]  famotidine (PEPCID) 20 MG tablet Take 1 tablet (20 mg total) by mouth 2 (two) times daily. 07/21/22   Dartha Lodge, PA-C  Multiple Vitamins-Minerals (ONE-A-DAY WOMENS 50+) TABS Take 1 tablet by mouth daily.    [provider]  naproxen (NAPROSYN) 375 MG tablet Take 1 tablet (375 mg total) by mouth 2 (two) times daily with a meal. 09/30/22   Harris, Abigail, PA-C  nicotine (NICODERM CQ - DOSED IN MG/24 HR) 7 mg/24hr patch Place 1 patch (7 mg total) onto the skin daily. 11/14/22   Lamar Sprinkles, MD  QUEtiapine (SEROQUEL) 200 MG tablet Take 1 tablet (200 mg total) by mouth at bedtime. 11/13/22 12/13/22  Lamar Sprinkles, MD   QUEtiapine (SEROQUEL) 50 MG tablet Take 1 tablet (50 mg total) by mouth daily. 11/14/22 12/14/22  Lamar Sprinkles, MD  traMADol (ULTRAM) 50 MG tablet Take 50 mg by mouth every 6 (six) hours as needed. 10/11/22   [provider]      Allergies    Patient has no known allergies.    Review of Systems   Review of Systems  Constitutional:  Positive for fatigue. Negative for diaphoresis and fever.  Respiratory:  Positive for shortness of breath. Negative for cough.   Cardiovascular:  Positive for chest pain. Negative for palpitations and near-syncope.  Gastrointestinal:  Negative for abdominal pain and nausea.  Musculoskeletal:  Negative for back pain.  Neurological:  Negative for numbness and headaches.    Physical Exam Updated Vital Signs BP 133/78 (BP Location: Right Arm)   Pulse 62   Temp 97.9 F (36.6 C) (Oral)   Resp 15   SpO2 100%  Physical Exam Vitals and nursing note reviewed.  Constitutional:      General: She is not in acute distress.    Appearance: She is well-developed. She is not ill-appearing, toxic-appearing or diaphoretic.  HENT:     Head: Normocephalic and atraumatic.     Nose: No congestion or rhinorrhea.     Mouth/Throat:     Mouth: Mucous membranes are moist.     Pharynx: No oropharyngeal exudate or posterior oropharyngeal erythema.  Eyes:  Extraocular Movements: Extraocular movements intact.     Conjunctiva/sclera: Conjunctivae normal.     Pupils: Pupils are equal, round, and reactive to light.  Cardiovascular:     Rate and Rhythm: Normal rate and regular rhythm.     Heart sounds: Normal heart sounds. No murmur heard. Pulmonary:     Effort: Pulmonary effort is normal. No respiratory distress.     Breath sounds: Normal breath sounds. No rhonchi or rales.  Chest:     Chest wall: No tenderness.  Abdominal:     General: Abdomen is flat.     Palpations: Abdomen is soft.     Tenderness: There is no abdominal tenderness. There is no right CVA  tenderness, left CVA tenderness, guarding or rebound.  Musculoskeletal:        General: No swelling.     Cervical back: Neck supple.     Right lower leg: No edema.     Left lower leg: No edema.  Skin:    General: Skin is warm and dry.     Capillary Refill: Capillary refill takes less than 2 seconds.     Findings: No erythema or rash.  Neurological:     General: No focal deficit present.     Mental Status: She is alert.     Sensory: No sensory deficit.     Motor: No weakness.  Psychiatric:        Mood and Affect: Mood normal.     ED Results / Procedures / Treatments   Labs (all labs ordered are listed, but only abnormal results are displayed) Labs Reviewed  CBC WITH DIFFERENTIAL/PLATELET - Abnormal; Notable for the following components:      Result Value   WBC 3.5 (*)    HCT 46.4 (*)    Neutro Abs 1.6 (*)    All other components within normal limits  COMPREHENSIVE METABOLIC PANEL - Abnormal; Notable for the following components:   Creatinine, Ser 1.10 (*)    Calcium 8.7 (*)    Total Protein 6.1 (*)    Albumin 3.3 (*)    GFR, Estimated 58 (*)    All other components within normal limits  MAGNESIUM  TROPONIN I (HIGH SENSITIVITY)  TROPONIN I (HIGH SENSITIVITY)    EKG EKG Interpretation Date/Time:  Saturday August 25 2023 06:57:27 EDT Ventricular Rate:  59 PR Interval:  118 QRS Duration:  85 QT Interval:  451 QTC Calculation: 447 R Axis:   -45  Text Interpretation: Sinus rhythm Borderline short PR interval LAD, consider left anterior fascicular block Probable left ventricular hypertrophy Anterior Q waves, possibly due to LVH Abnormal T, consider ischemia, inferior leads when compared to prior, overall similar appearance. No STEMI Confirmed by Theda Belfast ([id number]) on 08/25/2023 7:08:18 AM  Radiology DG Chest 2 View  Result Date: 08/25/2023 CLINICAL DATA:  Chest tightness for 1 day EXAM: CHEST - 2 VIEW COMPARISON:  04/09/2021 FINDINGS: Heart size and mediastinal  contours are normal. No pleural fluid or interstitial edema. No airspace opacities identified. The visualized osseous structures are unremarkable. IMPRESSION: No acute cardiopulmonary abnormalities. Electronically Signed   By: Signa Kell M.D.   On: 08/25/2023 07:48    Procedures Procedures    Medications Ordered in ED Medications  sodium chloride 0.9 % bolus 500 mL (0 mLs Intravenous Stopped 08/25/23 5784)    ED Course/ Medical Decision Making/ A&P  Medical Decision Making Amount and/or Complexity of Data Reviewed Labs: ordered. Radiology: ordered.    [name] is a 62 y.o. female with a past medical history significant for bipolar disorder, schizophrenia, anxiety, and depression who presents with shortness of breath, chest tightness, tingling, and lightheadedness after drinking orange soda.  According to patient, she is never had 1 soda before but drinks other sodas normally.  She said that last night she had some orange soda and had about 15 minutes of these strange symptoms where she felt tingling all over her body and numbness, felt some chest tightness and mild shortness of breath, and felt lightheaded.  She reports she did not lose consciousness and otherwise had no weakness anywhere.  She was not reporting other neurological plaints hatches headache, vision changes, or speech changes.  She reports no rash and no throat or tongue swelling sensation.  She denies history of other allergies at this time.  She said that she it went away and then she went to bed but then this morning woke up and had some more orange soda and had the same symptoms again lasting about 15 minutes.  She said they have since resolved but she was concerned.  She denies any other substances such as alcohol or drugs or other medication use recently.  She denies any trauma.  She denies any other nausea, vomiting, constipation, diarrhea, or urinary changes.  Denies any rashes.   No other complaints.  On exam, lungs clear.  Chest nontender.  Abdomen nontender.  No murmur on my exam.  Good pulses in extremities.  No focal neurologic deficit with intact sensation strength and pulse in extremities.  Symmetric smile.  Clear speech.  Pupils symmetric and reactive with normal extraocular movements.  No carotid bruit.  Patient resting comfortably without complaints now.  EKG appears similar to prior with no STEMI.  Due to this intermittent chest tightness and shortness of breath will get chest x-ray and labs, for tingling we will check some electrolytes and give her some fluids for this lightheadedness.  Suspect a mild reaction to this soda that may have had a different ingredient causing this reaction.  At this time do not suspect anaphylaxis.  Anticipate discharge if workup reassuring.       12:53 PM Workup returned overall reassuring.  Troponin negative x 2.  Patient observed for over 6 hours without recurrent symptoms.  We went through all the findings together and feel she is safe for discharge home.  Suspect some degree of reaction to the orange soda or a component of it so she will avoid this going forward.  Not suspect anaphylaxis at this time and do not feel she needs further medications given resolution of symptoms without recurrence.  Patient agrees with plan of care and was discharged in good condition.         Final Clinical Impression(s) / ED Diagnoses Final diagnoses:  Adverse food reaction, initial encounter  Paresthesia  Atypical chest pain    Rx / DC Orders ED Discharge Orders     None       Clinical Impression: 1. Adverse food reaction, initial encounter   2. Paresthesia   3. Atypical chest pain     Disposition: Discharge  Condition: Good  I have discussed the results, Dx and Tx plan with the pt(& family if present). He/she/they expressed understanding and agree(s) with the plan. Discharge instructions discussed at great length.  Strict return precautions discussed and pt &/or family have verbalized understanding  of the instructions. No further questions at time of discharge.    New Prescriptions   No medications on file    Follow Up: Ellyn Hack, MD [address] [place] [address] [phone]     [facility] at [facility] [address] [place] [address] [phone]       Jarius Dieudonne, Canary Brim, MD 08/25/23 1258

## 2023-08-25 NOTE — ED Triage Notes (Addendum)
Pt BIB [facility] with c/o intermittent numbness and tingling to bilateral upper extremities since 2000 last night. Symptoms occur when pt drinks orange soda. Pt reported to EMS that she only takes her "psych meds" and hasn't taken them in a few days. Pt able to move all extremities without difficulty. Pt also endorsing chest tightness since last night. EMS Vitals: BP 140/80 HR 64 O2 98% room air CBG 108

## 2023-11-14 ENCOUNTER — Emergency Department (HOSPITAL_COMMUNITY): Payer: 59

## 2023-11-14 ENCOUNTER — Encounter (HOSPITAL_COMMUNITY): Payer: Self-pay

## 2023-11-14 ENCOUNTER — Emergency Department (HOSPITAL_COMMUNITY)
Admission: EM | Admit: 2023-11-14 | Discharge: 2023-11-14 | Disposition: A | Discharge status: Home / Self Care | Payer: 59 | Attending: Emergency Medicine | Admitting: Emergency Medicine

## 2023-11-14 DIAGNOSIS — F1490 Cocaine use, unspecified, uncomplicated: Secondary | ICD-10-CM | POA: Insufficient documentation

## 2023-11-14 DIAGNOSIS — R519 Headache, unspecified: Secondary | ICD-10-CM | POA: Diagnosis not present

## 2023-11-14 DIAGNOSIS — M79645 Pain in left finger(s): Secondary | ICD-10-CM | POA: Insufficient documentation

## 2023-11-14 DIAGNOSIS — S62617A Displaced fracture of proximal phalanx of left little finger, initial encounter for closed fracture: Secondary | ICD-10-CM

## 2023-11-14 DIAGNOSIS — Z7982 Long term (current) use of aspirin: Secondary | ICD-10-CM | POA: Insufficient documentation

## 2023-11-14 DIAGNOSIS — S069XAA Unspecified intracranial injury with loss of consciousness status unknown, initial encounter: Secondary | ICD-10-CM

## 2023-11-14 LAB — CBC WITH DIFFERENTIAL/PLATELET
Abs Immature Granulocytes: 0.04 10*3/uL (ref 0.00–0.07)
Basophils Absolute: 0 10*3/uL (ref 0.0–0.1)
Basophils Relative: 1 %
Eosinophils Absolute: 0.2 10*3/uL (ref 0.0–0.5)
Eosinophils Relative: 4 %
HCT: 44.2 % (ref 36.0–46.0)
Hemoglobin: 14.2 g/dL (ref 12.0–15.0)
Immature Granulocytes: 1 %
Lymphocytes Relative: 21 %
Lymphs Abs: 1.3 10*3/uL (ref 0.7–4.0)
MCH: 29.7 pg (ref 26.0–34.0)
MCHC: 32.1 g/dL (ref 30.0–36.0)
MCV: 92.5 fL (ref 80.0–100.0)
Monocytes Absolute: 0.5 10*3/uL (ref 0.1–1.0)
Monocytes Relative: 9 %
Neutro Abs: 4 10*3/uL (ref 1.7–7.7)
Neutrophils Relative %: 64 %
Platelets: 236 10*3/uL (ref 150–400)
RBC: 4.78 MIL/uL (ref 3.87–5.11)
RDW: 14.9 % (ref 11.5–15.5)
WBC: 6.1 10*3/uL (ref 4.0–10.5)
nRBC: 0 % (ref 0.0–0.2)

## 2023-11-14 LAB — COMPREHENSIVE METABOLIC PANEL
ALT: 22 U/L (ref 0–44)
AST: 22 U/L (ref 15–41)
Albumin: 3.8 g/dL (ref 3.5–5.0)
Alkaline Phosphatase: 58 U/L (ref 38–126)
Anion gap: 7 (ref 5–15)
BUN: 22 mg/dL — ABNORMAL HIGH (ref 6–20)
CO2: 22 mmol/L (ref 22–32)
Calcium: 8.7 mg/dL — ABNORMAL LOW (ref 8.9–10.3)
Chloride: 108 mmol/L (ref 98–111)
Creatinine, Ser: 0.69 mg/dL (ref 0.44–1.00)
GFR, Estimated: >60 mL/min (ref >60)
Glucose, Bld: 109 mg/dL — ABNORMAL HIGH (ref 70–99)
Potassium: 3.9 mmol/L (ref 3.5–5.1)
Sodium: 137 mmol/L (ref 135–145)
Total Bilirubin: 0.7 mg/dL (ref <1.2)
Total Protein: 7 g/dL (ref 6.5–8.1)

## 2023-11-14 LAB — APTT: aPTT: 27 s (ref 24–36)

## 2023-11-14 LAB — PROTIME-INR
INR: 1 (ref 0.8–1.2)
Prothrombin Time: 12.9 s (ref 11.4–15.2)

## 2023-11-14 MED ORDER — HYDROCODONE-ACETAMINOPHEN 5-325 MG PO TABS
2.0000 | ORAL_TABLET | Freq: Once | ORAL | Status: AC
  Administered 2023-11-14: 2 via ORAL
  Filled 2023-11-14: qty 2

## 2023-11-14 MED ORDER — ONDANSETRON 8 MG PO TBDP
8.0000 mg | ORAL_TABLET | Freq: Once | ORAL | Status: DC
  Filled 2023-11-14: qty 1

## 2023-11-14 MED ORDER — OXYCODONE-ACETAMINOPHEN 5-325 MG PO TABS
1.0000 | ORAL_TABLET | Freq: Once | ORAL | Status: AC
  Administered 2023-11-14: 1 via ORAL
  Filled 2023-11-14: qty 1
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
## 2023-11-14 NOTE — Progress Notes (Signed)
Orthopedic Tech Progress Note Patient Details:  [name] December 24, 1961 [id number]  Ortho Devices Type of Ortho Device: Ulna gutter splint Ortho Device/Splint Location: LUE Ortho Device/Splint Interventions: Ordered, Application, Adjustment   Post Interventions Patient Tolerated: Poor Instructions Provided: Care of device Patient difficult to splint due to excessive moving of LUE, as well as pain. Darleen Crocker 11/14/2023, 7:11 AM

## 2023-11-14 NOTE — ED Provider Notes (Signed)
62 year old female received in signout to follow-up on repeat CT head imaging.  In the short showed minimal intracranial bleed.  Discussed with neurosurgery by previous provider.  See previous note for full details.  Physical Exam  BP 120/83 (BP Location: Right Arm)   Pulse 60   Temp 98.1 F (36.7 C) (Oral)   Resp 12   SpO2 98%     Procedures  Procedures  ED Course / MDM   Clinical Course as of 11/14/23 1005  Wed Nov 14, 2023  0349 Neurosurgery consultation placed.  [RR]  0411 Repeat CT scan at 0900.  [RR]    Clinical Course User Index [RR] Achille Rich, PA-C   Medical Decision Making Amount and/or Complexity of Data Reviewed Labs: ordered. Radiology: ordered.  Risk Prescription drug management.   Repeat CT without evidence of bleed.  Manage discussed with patient.  Discharged in stable condition.  Hand referral given.  Splint placed.  She is agreeable with plan.      Marita Kansas, PA-C 11/14/23 1006    Benjiman Core, MD 11/14/23 1520

## 2023-11-14 NOTE — ED Notes (Signed)
Ortho tech notified.  

## 2023-11-14 NOTE — Discharge Instructions (Signed)
Your workup today was reassuring.  The repeat CT scan of the head showed that the intracranial bleed resolved.  I have listed an orthopedic provider to follow-up regarding your finger fracture.  For any concerning symptoms return to the emergency room.
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
## 2023-11-14 NOTE — ED Notes (Signed)
Pt ambulated to bathroom 

## 2024-12-31 ENCOUNTER — Other Ambulatory Visit (HOSPITAL_COMMUNITY)
Admission: EM | Admit: 2024-12-31 | Discharge: 2024-12-31 | Disposition: A | Discharge status: Other Acute Inpatient Hospital | Source: Intra-hospital | Attending: Psychiatry | Admitting: Psychiatry

## 2024-12-31 ENCOUNTER — Emergency Department (HOSPITAL_COMMUNITY)

## 2024-12-31 ENCOUNTER — Emergency Department (HOSPITAL_COMMUNITY)
Admission: EM | Admit: 2024-12-31 | Discharge: 2024-12-31 | Disposition: A | Discharge status: Other Acute Inpatient Hospital | Attending: Emergency Medicine | Admitting: Emergency Medicine

## 2024-12-31 ENCOUNTER — Other Ambulatory Visit: Payer: Self-pay

## 2024-12-31 ENCOUNTER — Other Ambulatory Visit (INDEPENDENT_AMBULATORY_CARE_PROVIDER_SITE_OTHER)
Admission: EM | Admit: 2024-12-31 | Discharge: 2025-01-03 | Disposition: A | Discharge status: Rehabilitation Facility | Source: Intra-hospital | Attending: Family Medicine | Admitting: Family Medicine

## 2024-12-31 ENCOUNTER — Encounter (HOSPITAL_COMMUNITY): Payer: Self-pay

## 2024-12-31 DIAGNOSIS — F1410 Cocaine abuse, uncomplicated: Secondary | ICD-10-CM

## 2024-12-31 DIAGNOSIS — R441 Visual hallucinations: Secondary | ICD-10-CM | POA: Diagnosis not present

## 2024-12-31 DIAGNOSIS — F1424 Cocaine dependence with cocaine-induced mood disorder: Secondary | ICD-10-CM | POA: Insufficient documentation

## 2024-12-31 DIAGNOSIS — R059 Cough, unspecified: Secondary | ICD-10-CM | POA: Insufficient documentation

## 2024-12-31 DIAGNOSIS — R45851 Suicidal ideations: Secondary | ICD-10-CM | POA: Insufficient documentation

## 2024-12-31 DIAGNOSIS — F251 Schizoaffective disorder, depressive type: Secondary | ICD-10-CM | POA: Insufficient documentation

## 2024-12-31 DIAGNOSIS — F1910 Other psychoactive substance abuse, uncomplicated: Secondary | ICD-10-CM

## 2024-12-31 DIAGNOSIS — Z79899 Other long term (current) drug therapy: Secondary | ICD-10-CM | POA: Insufficient documentation

## 2024-12-31 DIAGNOSIS — R079 Chest pain, unspecified: Secondary | ICD-10-CM | POA: Diagnosis not present

## 2024-12-31 DIAGNOSIS — F1994 Other psychoactive substance use, unspecified with psychoactive substance-induced mood disorder: Secondary | ICD-10-CM | POA: Diagnosis not present

## 2024-12-31 DIAGNOSIS — Z7982 Long term (current) use of aspirin: Secondary | ICD-10-CM | POA: Diagnosis not present

## 2024-12-31 LAB — BASIC METABOLIC PANEL WITH GFR
Anion gap: 14 (ref 5–15)
BUN: 17 mg/dL (ref 8–23)
CO2: 25 mmol/L (ref 22–32)
Calcium: 10.1 mg/dL (ref 8.9–10.3)
Chloride: 102 mmol/L (ref 98–111)
Creatinine, Ser: 0.79 mg/dL (ref 0.44–1.00)
GFR, Estimated: >60 mL/min
Glucose, Bld: 139 mg/dL — ABNORMAL HIGH (ref 70–99)
Potassium: 3.6 mmol/L (ref 3.5–5.1)
Sodium: 141 mmol/L (ref 135–145)

## 2024-12-31 LAB — CBC
HCT: 48.3 % — ABNORMAL HIGH (ref 36.0–46.0)
Hemoglobin: 15.9 g/dL — ABNORMAL HIGH (ref 12.0–15.0)
MCH: 29.3 pg (ref 26.0–34.0)
MCHC: 32.9 g/dL (ref 30.0–36.0)
MCV: 89.1 fL (ref 80.0–100.0)
Platelets: 398 K/uL (ref 150–400)
RBC: 5.42 MIL/uL — ABNORMAL HIGH (ref 3.87–5.11)
RDW: 13.8 % (ref 11.5–15.5)
WBC: 9.1 K/uL (ref 4.0–10.5)
nRBC: 0 % (ref 0.0–0.2)

## 2024-12-31 LAB — URINE DRUG SCREEN
Amphetamines: NEGATIVE
Barbiturates: NEGATIVE
Benzodiazepines: NEGATIVE
Cocaine: POSITIVE — AB
Fentanyl: NEGATIVE
Methadone Scn, Ur: NEGATIVE
Opiates: NEGATIVE
Tetrahydrocannabinol: NEGATIVE

## 2024-12-31 LAB — URINALYSIS, ROUTINE W REFLEX MICROSCOPIC
Bilirubin Urine: NEGATIVE
Glucose, UA: >=500 mg/dL — AB
Ketones, ur: NEGATIVE mg/dL
Leukocytes,Ua: NEGATIVE
Nitrite: NEGATIVE
Protein, ur: NEGATIVE mg/dL
Specific Gravity, Urine: 1.023 (ref 1.005–1.030)
pH: 5 (ref 5.0–8.0)

## 2024-12-31 LAB — TROPONIN T, HIGH SENSITIVITY
Troponin T High Sensitivity: 16 ng/L (ref 0–19)
Troponin T High Sensitivity: <15 ng/L (ref 0–19)

## 2024-12-31 LAB — ACETAMINOPHEN LEVEL: Acetaminophen (Tylenol), Serum: <10 ug/mL — ABNORMAL LOW (ref 10–30)

## 2024-12-31 LAB — SALICYLATE LEVEL: Salicylate Lvl: <7 mg/dL — ABNORMAL LOW (ref 7.0–30.0)

## 2024-12-31 LAB — ETHANOL: Alcohol, Ethyl (B): <15 mg/dL

## 2024-12-31 MED ORDER — MAGNESIUM HYDROXIDE 400 MG/5ML PO SUSP: 30.0000 mL | Freq: Every day | ORAL | Status: DC | PRN

## 2024-12-31 MED ORDER — QUETIAPINE FUMARATE 50 MG PO TABS: 50.0000 mg | ORAL_TABLET | Freq: Every day | ORAL | Status: DC

## 2024-12-31 MED ORDER — OLANZAPINE 5 MG PO TBDP: 5.0000 mg | ORAL_TABLET | Freq: Three times a day (TID) | ORAL | Status: DC | PRN

## 2024-12-31 MED ORDER — OLANZAPINE 10 MG IM SOLR: 5.0000 mg | Freq: Three times a day (TID) | INTRAMUSCULAR | Status: DC | PRN

## 2024-12-31 MED ORDER — HYDROXYZINE HCL 25 MG PO TABS
25.0000 mg | ORAL_TABLET | Freq: Three times a day (TID) | ORAL | Status: DC | PRN
  Administered 2025-01-01 – 2025-01-02 (×3): 25 mg via ORAL
  Filled 2024-12-31 (×3): qty 1

## 2024-12-31 MED ORDER — ALUM & MAG HYDROXIDE-SIMETH 200-200-20 MG/5ML PO SUSP: 30.0000 mL | ORAL | Status: DC | PRN

## 2024-12-31 MED ORDER — ACETAMINOPHEN 325 MG PO TABS
650.0000 mg | ORAL_TABLET | Freq: Four times a day (QID) | ORAL | Status: DC | PRN
  Administered 2025-01-01 (×2): 650 mg via ORAL
  Filled 2024-12-31 (×3): qty 2
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
## 2024-12-31 NOTE — ED Notes (Signed)
 Report called to [facility]

## 2024-12-31 NOTE — ED Notes (Signed)
 Report given to Tinnie Netters, RN at [facility], states that she will put in chat when the pt is able to be transferred.

## 2024-12-31 NOTE — ED Provider Notes (Signed)
 Facility Based Crisis Admission H&P  Date: 01/01/2025 Patient Name: [name] MRN: [id number] Chief Complaint: substance abuse  Diagnoses:  Final diagnoses:  Substance abuse (HCC)  Cocaine  abuse (HCC)    HPI: [name], 64 y/o female with a history of polysubstance abuse, (cocaine ,), schizophrenia bipolar disorder. SI, MDD.  Presented to [facility] as direct admit to [facility].  Patient is alert and oriented x 4, speech is clear, maintain eye contact.  Does not show any sign of distress at this present moment.  Currently denies SI, HI, reports she does see figures when she steered something for too long.  Patient reported that she relapsed on cocaine  a week ago the last time she stated she used was 2 days ago she also report she used alcohol 2 days ago.  Patient is currently unemployed, reported that she lives with family.  She preadmission notes: [name] is a 64 y.o. female admitted: Presented to the ED on 12/31/2024  8:48 AM for SI, relapsed on cocaine  and VH of people. She carries the psychiatric diagnoses of bipolar, schizophrenia and cocaine  use and denies a past medical history.   Her current presentation of SI, depressive symptoms, VH and cocaine  use is most consistent with substance induced mood disorder. She meets criteria for inpatient/facility based crisis based on SI, VH and depressive symptoms in the context of substance abuse. Current outpatient psychotropic medications include Seroquel  50 am and 200 mg QHS and historically she has had a positive response to these medications. She was non compliant with medications prior to admission as evidenced by reporting that she has been off medications for years.   PHQ-9 completed  Patient is referred for [facility]  PHQ 2-9:  Flowsheet Row ED from 12/31/2024 in [facility]  Thoughts that you would be better off dead, or of hurting yourself in some way Several days  PHQ-9 Total Score 8    Flowsheet Row ED  from 12/31/2024 in [facility] Most recent reading at 01/01/2025  1:18 AM ED from 12/31/2024 in [facility] Emergency Department at [facility] Most recent reading at 12/31/2024  8:52 AM ED from 11/14/2023 in [facility] Emergency Department at [facility] Most recent reading at 11/14/2023  1:11 AM  C-SSRS RISK CATEGORY Low Risk Low Risk No Risk      Total Time spent with patient: 30 minutes  Musculoskeletal  Strength & Muscle Tone: within normal limits Gait & Station: normal Patient leans: N/A  Psychiatric Specialty Exam  Presentation General Appearance:  Appropriate for Environment  Eye Contact: Good  Speech: Clear and Coherent  Speech Volume: Normal  Handedness: Right   Mood and Affect  Mood: Anxious  Affect: Appropriate   Thought Process  Thought Processes: Coherent  Descriptions of Associations:Intact  Orientation:Full (Time, Place and Person)  Thought Content:WDL  Diagnosis of Schizophrenia or Schizoaffective disorder in past: No data recorded Duration of Psychotic Symptoms: No data recorded Hallucinations:Hallucinations: Visual Description of Visual Hallucinations: Sees figures  Ideas of Reference:None  Suicidal Thoughts:Suicidal Thoughts: No  Homicidal Thoughts:Homicidal Thoughts: No   Sensorium  Memory: Immediate Fair  Judgment: Poor  Insight: Poor   Executive Functions  Concentration: Fair  Attention Span: Fair  Recall: Fair  Fund of Knowledge: Fair  Language: Fair   Psychomotor Activity  Psychomotor Activity: Psychomotor Activity: Normal   Assets  Assets: Desire for Improvement; Resilience; Vocational/Educational   Sleep  Sleep: Sleep: Fair Number of Hours of Sleep: 6   Nutritional  Assessment (For OBS and FBC admissions only) Has the patient had a weight loss or gain of 10 pounds or more in the last 3 months?: No Has the patient had a decrease in food intake/or  appetite?: No Does the patient have dental problems?: No Does the patient have eating habits or behaviors that may be indicators of an eating disorder including binging or inducing vomiting?: No Has the patient recently lost weight without trying?: 0 Has the patient been eating poorly because of a decreased appetite?: 0 Malnutrition Screening Tool Score: 0    Physical Exam HENT:     Head: Normocephalic.     Nose: Nose normal.  Eyes:     Pupils: Pupils are equal, round, and reactive to light.  Cardiovascular:     Rate and Rhythm: Normal rate.  Pulmonary:     Effort: Pulmonary effort is normal.  Musculoskeletal:        General: Normal range of motion.     Cervical back: Normal range of motion.  Neurological:     General: No focal deficit present.     Mental Status: She is alert.  Psychiatric:        Mood and Affect: Mood normal.        Behavior: Behavior normal.        Thought Content: Thought content normal.        Judgment: Judgment normal.    Review of Systems  HENT: Negative.    Eyes: Negative.   Respiratory: Negative.    Cardiovascular: Negative.   Genitourinary: Negative.   Musculoskeletal: Negative.   Skin: Negative.   Neurological: Negative.   Psychiatric/Behavioral:  Positive for depression and substance abuse.     Blood pressure (!) 141/88, pulse 94, temperature 98.4 F (36.9 C), temperature source Oral, resp. rate 18, SpO2 99%. There is no height or weight on file to calculate BMI.  Past Psychiatric History: Bipolar disorder, schizophrenia, substance abuse, suicide  Is the patient at risk to self? No  Has the patient been a risk to self in the past 6 months? Yes .    Has the patient been a risk to self within the distant past? Yes   Is the patient a risk to others? No   Has the patient been a risk to others in the past 6 months? No   Has the patient been a risk to others within the distant past? No   Past Medical History: See chart Family History:  Unknown Social History: Cocaine  abuse, alcohol  Last Labs:  Admission on 12/31/2024, Discharged on 12/31/2024  Component Date Value Ref Range Status   Sodium 12/31/2024 141  135 - 145 mmol/L Final   Potassium 12/31/2024 3.6  3.5 - 5.1 mmol/L Final   Chloride 12/31/2024 102  98 - 111 mmol/L Final   CO2 12/31/2024 25  22 - 32 mmol/L Final   Glucose, Bld 12/31/2024 139 (H)  70 - 99 mg/dL Final   Glucose reference range applies only to samples taken after fasting for at least 8 hours.   BUN 12/31/2024 17  8 - 23 mg/dL Final   Creatinine, Ser 12/31/2024 0.79  0.44 - 1.00 mg/dL Final   Calcium  12/31/2024 10.1  8.9 - 10.3 mg/dL Final   GFR, Estimated 12/31/2024 >60  >60 mL/min Final   Comment: (NOTE) Calculated using the CKD-EPI Creatinine Equation (2021)    Anion gap 12/31/2024 14  5 - 15 Final   Performed at [facility], [address]  [place], [place] [address]   WBC 12/31/2024 9.1  4.0 - 10.5 K/uL Final   RBC 12/31/2024 5.42 (H)  3.87 - 5.11 MIL/uL Final   Hemoglobin 12/31/2024 15.9 (H)  12.0 - 15.0 g/dL Final   HCT 98/92/7973 48.3 (H)  36.0 - 46.0 % Final   MCV 12/31/2024 89.1  80.0 - 100.0 fL Final   MCH 12/31/2024 29.3  26.0 - 34.0 pg Final   MCHC 12/31/2024 32.9  30.0 - 36.0 g/dL Final   RDW 98/92/7973 13.8  11.5 - 15.5 % Final   Platelets 12/31/2024 398  150 - 400 K/uL Final   nRBC 12/31/2024 0.0  0.0 - 0.2 % Final   Performed at [facility], [address], [place], [place] [address]   Troponin T High Sensitivity 12/31/2024 16  0 - 19 ng/L Final   Comment: (NOTE) Biotin concentrations > 1000 ng/mL falsely decrease TnT results.  Serial cardiac troponin measurements are suggested.  Refer to the Links section for chest pain algorithms and additional  guidance. Performed at [facility], [address] [place], [place] [address]    Color, Urine 12/31/2024 YELLOW  YELLOW Final   APPearance 12/31/2024 HAZY (A)  CLEAR  Final   Specific Gravity, Urine 12/31/2024 1.023  1.005 - 1.030 Final   pH 12/31/2024 5.0  5.0 - 8.0 Final   Glucose, UA 12/31/2024 >=500 (A)  NEGATIVE mg/dL Final   Hgb urine dipstick 12/31/2024 MODERATE (A)  NEGATIVE Final   Bilirubin Urine 12/31/2024 NEGATIVE  NEGATIVE Final   Ketones, ur 12/31/2024 NEGATIVE  NEGATIVE mg/dL Final   Protein, ur 98/92/7973 NEGATIVE  NEGATIVE mg/dL Final   Nitrite 98/92/7973 NEGATIVE  NEGATIVE Final   Leukocytes,Ua 12/31/2024 NEGATIVE  NEGATIVE Final   RBC / HPF 12/31/2024 0-5  0 - 5 RBC/hpf Final   WBC, UA 12/31/2024 0-5  0 - 5 WBC/hpf Final   Bacteria, UA 12/31/2024 MANY (A)  NONE SEEN Final   Squamous Epithelial / HPF 12/31/2024 0-5  0 - 5 /HPF Final   Mucus 12/31/2024 PRESENT   Final   Performed at [facility], [address], [place], [place] [address]   Opiates 12/31/2024 NEGATIVE  NEGATIVE Final   Cocaine  12/31/2024 POSITIVE (A)  NEGATIVE Final   Benzodiazepines 12/31/2024 NEGATIVE  NEGATIVE Final   Amphetamines 12/31/2024 NEGATIVE  NEGATIVE Final   Tetrahydrocannabinol 12/31/2024 NEGATIVE  NEGATIVE Final   Barbiturates 12/31/2024 NEGATIVE  NEGATIVE Final   Methadone Scn, Ur 12/31/2024 NEGATIVE  NEGATIVE Final   Fentanyl 12/31/2024 NEGATIVE  NEGATIVE Final   Comment: (NOTE) Drug screen is for Medical Purposes only. Positive results are preliminary only. If confirmation is needed, notify lab within 5 days.  Drug Class                 Cutoff (ng/mL) Amphetamine and metabolites 1000 Barbiturate and metabolites 200 Benzodiazepine              200 Opiates and metabolites     300 Cocaine  and metabolites     300 THC                         50 Fentanyl                    5 Methadone                   300  Trazodone  is metabolized in vivo  to several metabolites,  including pharmacologically active m-CPP, which is excreted in the  urine.  Immunoassay screens for amphetamines and MDMA have potential  cross-reactivity with  these compounds and may provide false positive  result.  Performed at [facility], [address], [place], [place] [address]    Acetaminophen  (Tylenol ), Serum 12/31/2024 <10 (L)  10 - 30 ug/mL Final   Comment: (NOTE) Toxic concentrations can be more effectively related to post dose interval; >200, >100, and >50 ug/mL serum concentrations correspond to toxic concentrations at 4, 8, and 12 hours post dose, respectively.  Performed at [facility], [address] [place], [place] [address]    Salicylate Lvl 12/31/2024 <7.0 (L)  7.0 - 30.0 mg/dL Final   Performed at [facility], [address] [place], [place] [address]   Alcohol, Ethyl (B) 12/31/2024 <15  <15 mg/dL Final   Comment: (NOTE) For medical purposes only. Performed at [facility], [address], [place], [place] [address]    Troponin T High Sensitivity 12/31/2024 <15  0 - 19 ng/L Final   Comment: (NOTE) Biotin concentrations > 1000 ng/mL falsely decrease TnT results.  Serial cardiac troponin measurements are suggested.  Refer to the Links section for chest pain algorithms and additional  guidance. Performed at [facility], [address] [place], [place] [address]     Allergies: Patient has no known allergies.  Medications:  Facility Ordered Medications  Medication   QUEtiapine  (SEROQUEL ) tablet 50 mg   acetaminophen  (TYLENOL ) tablet 650 mg   alum & mag hydroxide-simeth (MAALOX/MYLANTA) 200-200-20 MG/5ML suspension 30 mL   magnesium  hydroxide (MILK OF MAGNESIA) suspension 30 mL   OLANZapine  zydis (ZYPREXA ) disintegrating tablet 5 mg   OLANZapine  (ZYPREXA ) injection 5 mg   hydrOXYzine  (ATARAX ) tablet 25 mg   PTA Medications  Medication Sig   famotidine  (PEPCID ) 20 MG tablet Take 1 tablet (20 mg total) by mouth 2 (two) times daily. (Patient not taking: Reported on 11/14/2023)   naproxen  (NAPROSYN ) 375 MG  tablet Take 1 tablet (375 mg total) by mouth 2 (two) times daily with a meal. (Patient not taking: Reported on 11/14/2023)   nicotine  (NICODERM CQ  - DOSED IN MG/24 HR) 7 mg/24hr patch Place 1 patch (7 mg total) onto the skin daily. (Patient not taking: Reported on 11/14/2023)   QUEtiapine  (SEROQUEL ) 200 MG tablet Take 1 tablet (200 mg total) by mouth at bedtime. (Patient not taking: Reported on 12/31/2024)   QUEtiapine  (SEROQUEL ) 50 MG tablet Take 1 tablet (50 mg total) by mouth daily. (Patient not taking: Reported on 12/31/2024)    Long Term Goals: Improvement in symptoms so as ready for discharge  Short Term Goals: Patient will verbalize feelings in meetings with treatment team members., Patient will attend at least of 50% of the groups daily., Pt will complete the PHQ9 on admission, day 3 and discharge., Patient will participate in completing the [place] Suicide Severity Rating Scale, Patient will score a low risk of violence for 24 hours prior to discharge, and Patient will take medications as prescribed daily.  Medical Decision Making  [facility] unit    Recommendations  Based on my evaluation the patient does not appear to have an emergency medical condition.  Gaither Pouch, NP 01/01/2025  3:25 AM

## 2024-12-31 NOTE — ED Triage Notes (Addendum)
 Pt BIBA appears to be homeless, c/o chest pain & SOB. States she does cocaine  & states she had it last this morning per EMS pt took 4-81 mg tab aspirin  before arrival. Pt A&O, & eating. NAD  Pt states she has thoughts of hurting herself and is also here to get help for it

## 2024-12-31 NOTE — ED Notes (Signed)
 Pt has four pt belonging bags and have been placed in the cabinets hall D.

## 2024-12-31 NOTE — Consult Note (Signed)
 [facility] Psychiatric Consult Initial  Patient Name: .[name]  MRN: [id number]  DOB: 1961-04-30  Consult Order details:  Orders (From admission, onward)     Start     Ordered   12/31/24 1301  CONSULT TO CALL ACT TEAM       Ordering Provider: Francesca Elsie CROME, MD  Provider:  (Not yet assigned)  Question:  Reason for Consult?  Answer:  Psych consult   12/31/24 1300             Mode of Visit: In person    Psychiatry Consult Evaluation  Service Date: December 31, 2024 LOS:  LOS: 0 days  Chief Complaint SI, relapsed on cocaine  and VH of people   Primary Psychiatric Diagnoses  SI 2.  Substance induced mood disorder 3.  Cocaine  abuse  Assessment  [name] is a 64 y.o. female admitted: Presented to the ED on 12/31/2024  8:48 AM for SI, relapsed on cocaine  and VH of people. She carries the psychiatric diagnoses of bipolar, schizophrenia and cocaine  use and denies a past medical history.  Her current presentation of SI, depressive symptoms, VH and cocaine  use is most consistent with substance induced mood disorder. She meets criteria for inpatient/facility based crisis based on SI, VH and depressive symptoms in the context of substance abuse. Current outpatient psychotropic medications include Seroquel  50 am and 200 mg QHS and historically she has had a positive response to these medications. She was non compliant with medications prior to admission as evidenced by reporting that she has been off medications for years.   Diagnoses:  Active Hospital problems: Principal Problem:   Suicidal ideation Active Problems:   Cocaine  abuse (HCC)   Substance induced mood disorder (HCC)    Plan   ## Psychiatric Medication Recommendations:  Start Seroquel  50 mg po at bedtime for mood stabilization  ## Medical Decision Making Capacity: Not specifically addressed in this encounter  ## Further Work-up:  -- UDS pending collection  --lipid panel, A1C, and TSH (add to  admission order set) -- most recent EKG on 12/31/24 had QtC of 472 -- Pertinent labwork reviewed earlier this admission includes: CMP, CBC, BAL, EKG   ## Disposition:--Patient is recommended for inpatient/Facility Based Crisis Center for mood stabilization and substance abuse treatment. Patient is voluntary.  ## Behavioral / Environmental: - No specific recommendations at this time.     ## Safety and Observation Level:  - Based on my clinical evaluation, I estimate the patient to be at high risk of self harm in the current setting. - At this time, we recommend  1:1 Observation. This decision is based on my review of the chart including patient's history and current presentation, interview of the patient, mental status examination, and consideration of suicide risk including evaluating suicidal ideation, plan, intent, suicidal or self-harm behaviors, risk factors, and protective factors. This judgment is based on our ability to directly address suicide risk, implement suicide prevention strategies, and develop a safety plan while the patient is in the clinical setting. Please contact our team if there is a concern that risk level has changed.  CSSR Risk Category:C-SSRS RISK CATEGORY: Low Risk  Suicide Risk Assessment: Patient has following modifiable risk factors for suicide: active suicidal ideation, lack of access to outpatient mental health resources, and triggering events, which we are addressing by recommending inpatient/facility based crisis for mood stabilization. Patient has following non-modifiable or demographic risk factors for suicide: history of suicide attempt and psychiatric hospitalization Patient has the following  protective factors against suicide: no history of NSSIB  Thank you for this consult request. Recommendations have been communicated to the primary team.  We will continue to follow at this time.   Teresa Wyline CROME, NP       History of Present Illness  Relevant  Aspects of Hospital ED Course: Admitted on 12/31/2024 for SI, relapsed on cocaine  and VH. Per EDP note: [name] is a 64 y.o. female history of bipolar disorder, schizophrenia, cocaine  use disorder presenting with chest pain, suicidal ideation. She reports that she is having chest pain since last night that is worse with coughing. Reports she has had some cough lately. No exertional pain, pleuritic pain. No fevers or chills. She reports occasional shortness of breath. No productive cough. No runny nose or sore throat. No back pain. No abdominal pain. She reports she is continuing to use cocaine . Patient also reports suicidal ideations with plan to cut herself, reports that she has had this previously. Denies any ingestion or attempt. Reports that she took 4 aspirin  last night for her chest pain but otherwise no intentional misuse or overdose of this medicine or any other. Denies any hallucinations or homicidal ideation.   Patient Report:  Patient states that she relapsed on cocaine  1 week ago and would like to get off drugs and get back on her mental health medications. She reports years ago she was prescribed Seroquel . She denies outpatient psychiatry and counseling. She states that she was prescribed Seroquel  by [facility]. Per chart review, she was prescribed Seroquel  200 mg at bedtime and Seroquel  50 mg daily at the [facility] in November 2023.   She reports using cocaine  for 10 years. She reports smoking $100 worth of cocaine  on average. She states that she last used cocaine  yesterday. UDS pending collection. She denies using other illicit drugs. She reports drinking alcohol occasionally, every 2 weeks and states that she last consumed 2 beers yesterday. BAL negative on arrival. She denies a history of alcohol withdrawal seizures or delirium tremens. She denies alcohol withdrawal symptoms on exam. She reports that history of substance abuse rehabilitation  at [facility] years ago.   She endorses suicidal ideations with no plan or intent. When asked if she expressed suicidal thoughts with a plan to cut herself to the emergency room doctor, she states that the doctor asked her how would she do it. She reports 1 suicide attempt years ago by cutting herself. She denies a history of self-harm behaviors.  She reports feeling depressed for the past week and endorses depressive symptoms of sadness, hopelessness, worthlessness, irritability, decreased energy, and decreased motivation. She endorses visual hallucinations for the past week that she describes as seeing images of people. She reports feeling paranoid like people are trying to attack her for the past week. She denies auditory hallucinations. Objectively, no signs of acute psychosis and patient does not appear to be responding to internal or external stimuli on exam.  She states that she lives with her ex-boyfriend. She states that she is planning to return back to [place]  at the beginning of February after she get her check because she feels lonely here in [place] . She states that she has 2 children a 18 year old daughter who lives in [place] and a 80 year old son who lives in [place] . She is unemployed and receives disability. She denies legal issues.  She denies a medical history.  She denies taking prescribed medications. She would like to get back on  Seroquel .  Psych ROS:  Depression: Yes Psychosis: (lifetime and current): Yes, VH and paranoia   Review of Systems  Respiratory: Negative.    Cardiovascular: Negative.   Psychiatric/Behavioral:  Positive for depression, hallucinations, substance abuse and suicidal ideas.      Psychiatric and Social History  Psychiatric History:  Information collected from the patient, and EMR   Prev Dx/Sx: bipolar, schizophrenia and cocaine  use  Current Psych Provider: No Home Meds (current): No Previous Med Trials: Seroquel   Therapy: No  Prior  Psych Hospitalization: Yes, [facility]  Prior Self Harm: No Prior Violence: No  Family Psych History: Father side of the family history of mental illness and drug addiction  Family Hx suicide: No  Social History:  Developmental Hx: Normal  Educational Hx: some college  Occupational Hx: Unemployed and is on disability Legal Hx: No Living Situation: Lives with ex-boyfriend  Access to weapons/lethal means: No  Substance History Alcohol: Occasionally Type of alcohol beer Last Drink yesterday, 12/30/2024 Number of drinks per day 2 beers History of alcohol withdrawal seizures No History of DT's No Illicit drugs: Cocaine  use  Prescription drug abuse: No Rehab hx: yes, [facility] years ago   Exam Findings  Physical Exam:  Vital Signs:  Temp:  [98.1 F (36.7 C)] 98.1 F (36.7 C) (01/07 0844) Pulse Rate:  [86] 86 (01/07 0844) Resp:  [20] 20 (01/07 0844) BP: (158)/(116) 158/116 (01/07 0844) SpO2:  [100 %] 100 % (01/07 0844) Weight:  [45.4 kg] 45.4 kg (01/07 0851) Blood pressure (!) 158/116, pulse 86, temperature 98.1 F (36.7 C), temperature source Oral, resp. rate 20, height 5' (1.524 m), weight 45.4 kg, SpO2 100%. Body mass index is 19.55 kg/m.  Physical Exam Cardiovascular:     Rate and Rhythm: Normal rate.     Comments: Hypertensive  Musculoskeletal:        General: Normal range of motion.     Cervical back: Normal range of motion.  Neurological:     Mental Status: She is alert and oriented to person, place, and time.     Mental Status Exam: General Appearance: Disheveled  Orientation:  Full (Time, Place, and Person)  Memory:  Immediate;   Fair Recent;   Fair Remote;   Fair  Concentration:  Concentration: Fair  Recall:  Fair  Attention  Fair  Eye Contact:  Fair  Speech:  Normal Rate  Language:  Fair  Volume:  Normal  Mood: Depressed   Affect:  Congruent  Thought Process:  Goal Directed and Linear  Thought Content:  Logical  Suicidal Thoughts:  Yes.  without  intent/plan  Homicidal Thoughts:  No  Judgement:  Intact  Insight:  Present  Psychomotor Activity:  Normal  Akathisia:  No  Fund of Knowledge:  Fair      Assets:  Manufacturing Systems Engineer Desire for Improvement Financial Resources/Insurance  Cognition:  WNL  ADL's:  Intact  AIMS (if indicated):   N/A     Other History   These have been pulled in through the EMR, reviewed, and updated if appropriate.  Family History:  The patient's family history includes Cancer in her maternal aunt; Mental illness in her maternal aunt and paternal aunt.  Medical History: Past Medical History:  Diagnosis Date   Anxiety    Bipolar 1 disorder (HCC)    Depression    Drug abuse (HCC)    Headache    Schizophrenia (HCC)     Surgical History: Past Surgical History:  Procedure Laterality Date  NO PAST SURGERIES       Medications:  Current Medications[1]  Allergies: Allergies[2]  Kiersten Coss L, NP     [1] No current facility-administered medications for this encounter.  Current Outpatient Medications:    aspirin  81 MG chewable tablet, Chew 81 mg by mouth daily. (Patient not taking: Reported on 11/14/2023), Disp: , Rfl:    Ensure (ENSURE), Take 237 mLs by mouth daily as needed. (Patient not taking: Reported on 11/14/2023), Disp: , Rfl:    famotidine  (PEPCID ) 20 MG tablet, Take 1 tablet (20 mg total) by mouth 2 (two) times daily. (Patient not taking: Reported on 11/14/2023), Disp: 30 tablet, Rfl: 0   Multiple Vitamins-Minerals (ONE-A-DAY WOMENS 50+) TABS, Take 1 tablet by mouth daily. (Patient not taking: Reported on 11/14/2023), Disp: , Rfl:    naproxen  (NAPROSYN ) 375 MG tablet, Take 1 tablet (375 mg total) by mouth 2 (two) times daily with a meal. (Patient not taking: Reported on 11/14/2023), Disp: 20 tablet, Rfl: 0   nicotine  (NICODERM CQ  - DOSED IN MG/24 HR) 7 mg/24hr patch, Place 1 patch (7 mg total) onto the skin daily. (Patient not taking: Reported on 11/14/2023), Disp: 28 patch,  Rfl: 0   QUEtiapine  (SEROQUEL ) 200 MG tablet, Take 1 tablet (200 mg total) by mouth at bedtime. (Patient taking differently: Take 200 mg by mouth daily as needed (Sleep).), Disp: 30 tablet, Rfl: 0   QUEtiapine  (SEROQUEL ) 50 MG tablet, Take 1 tablet (50 mg total) by mouth daily. (Patient taking differently: Take 50 mg by mouth daily as needed (Sleep).), Disp: 30 tablet, Rfl: 0   traMADol (ULTRAM) 50 MG tablet, Take 50 mg by mouth every 6 (six) hours as needed. (Patient not taking: Reported on 11/14/2023), Disp: , Rfl:  [2] No Known Allergies

## 2024-12-31 NOTE — ED Notes (Signed)
 Patient was asked to change out into burgandy scrubs due to her suicidal ideations. Patient said stop rushing me let me eat my food.

## 2024-12-31 NOTE — ED Notes (Signed)
 Patient signed paper voluntary consent

## 2024-12-31 NOTE — ED Provider Notes (Signed)
 "  EMERGENCY DEPARTMENT AT [facility] Provider Note  CSN: [id number] Arrival date & time: 12/31/24 9161  Chief Complaint(s) Chest Pain  HPI [name] is a 64 y.o. female history of bipolar disorder, schizophrenia, cocaine  use disorder presenting with chest pain, suicidal ideation.  She reports that she is having chest pain since last night that is worse with coughing.  Reports she has had some cough lately.  No exertional pain, pleuritic pain.  No fevers or chills.  She reports occasional shortness of breath.  No productive cough.  No runny nose or sore throat.  No back pain.  No abdominal pain.  She reports she is continuing to use cocaine .  Patient also reports suicidal ideations with plan to cut herself, reports that she has had this previously.  Denies any ingestion or attempt.  Reports that she took 4 aspirin  last night for her chest pain but otherwise no intentional misuse or overdose of this medicine or any other.  Denies any hallucinations or homicidal ideation  She request a pregnancy test reports I know I can still be pregnant at 59   Past Medical History Past Medical History:  Diagnosis Date   Anxiety    Bipolar 1 disorder (HCC)    Depression    Drug abuse (HCC)    Headache    Schizophrenia (HCC)    Patient Active Problem List   Diagnosis Date Noted   Cocaine  dependence, continuous use (HCC) 04/30/2020   Schizoaffective disorder (HCC) 11/05/2018   Bacterial vaginosis 03/01/2017   Schizoaffective disorder, bipolar type (HCC) 05/18/2016   Annual physical exam 02/15/2016   Encounter for routine gynecological examination 02/15/2016   Breast cancer screening 02/15/2016   Affective psychosis, bipolar (HCC)    Cannabis use disorder, moderate, dependence (HCC) 09/22/2015   Cocaine  use disorder, severe, dependence (HCC) 09/22/2015   Suicidal ideations    Lower urinary tract infectious disease 09/04/2012   Home Medication(s) Prior to Admission  medications  Medication Sig Start Date End Date Taking? Authorizing Provider  aspirin  81 MG chewable tablet Chew 81 mg by mouth daily. Patient not taking: Reported on 11/14/2023    [provider]  Ensure (ENSURE) Take 237 mLs by mouth daily as needed. Patient not taking: Reported on 11/14/2023    [provider]  famotidine  (PEPCID ) 20 MG tablet Take 1 tablet (20 mg total) by mouth 2 (two) times daily. Patient not taking: Reported on 11/14/2023 07/21/22   Kehrli, Kelsey F, PA-C  Multiple Vitamins-Minerals (ONE-A-DAY WOMENS 50+) TABS Take 1 tablet by mouth daily. Patient not taking: Reported on 11/14/2023    [provider]  naproxen  (NAPROSYN ) 375 MG tablet Take 1 tablet (375 mg total) by mouth 2 (two) times daily with a meal. Patient not taking: Reported on 11/14/2023 09/30/22   Harris, Abigail, PA-C  nicotine  (NICODERM CQ  - DOSED IN MG/24 HR) 7 mg/24hr patch Place 1 patch (7 mg total) onto the skin daily. Patient not taking: Reported on 11/14/2023 11/14/22   Rainelle Pfeiffer, MD  QUEtiapine  (SEROQUEL ) 200 MG tablet Take 1 tablet (200 mg total) by mouth at bedtime. Patient taking differently: Take 200 mg by mouth daily as needed (Sleep). 11/13/22 11/14/23  Rainelle Pfeiffer, MD  QUEtiapine  (SEROQUEL ) 50 MG tablet Take 1 tablet (50 mg total) by mouth daily. Patient taking differently: Take 50 mg by mouth daily as needed (Sleep). 11/14/22 11/14/23  Rainelle Pfeiffer, MD  traMADol (ULTRAM) 50 MG tablet Take 50 mg by mouth every 6 (six) hours  as needed. Patient not taking: Reported on 11/14/2023 10/11/22   [provider]                                                                                                                                    Past Surgical History Past Surgical History:  Procedure Laterality Date   NO PAST SURGERIES     Family History Family History  Problem Relation Age of Onset   Mental illness Maternal Aunt    Cancer Maternal Aunt         breast    Mental illness Paternal Aunt    Anesthesia problems Neg Hx    Hypotension Neg Hx    Malignant hyperthermia Neg Hx    Pseudochol deficiency Neg Hx     Social History Social History[1] Allergies Patient has no known allergies.  Review of Systems Review of Systems  All other systems reviewed and are negative.   Physical Exam Vital Signs  I have reviewed the triage vital signs BP (!) 158/116 (BP Location: Right Arm)   Pulse 86   Temp 98.1 F (36.7 C) (Oral)   Resp 20   Ht 5' (1.524 m)   Wt 45.4 kg   SpO2 100%   BMI 19.55 kg/m  Physical Exam Vitals and nursing note reviewed.  Constitutional:      General: She is not in acute distress.    Appearance: She is well-developed.     Comments: Eating chicken  HENT:     Head: Normocephalic and atraumatic.     Mouth/Throat:     Mouth: Mucous membranes are moist.  Eyes:     Pupils: Pupils are equal, round, and reactive to light.  Cardiovascular:     Rate and Rhythm: Normal rate and regular rhythm.     Heart sounds: No murmur heard. Pulmonary:     Effort: Pulmonary effort is normal. No respiratory distress.     Breath sounds: Normal breath sounds.  Abdominal:     General: Abdomen is flat.     Palpations: Abdomen is soft.     Tenderness: There is no abdominal tenderness.  Musculoskeletal:        General: No tenderness.     Right lower leg: No edema.     Left lower leg: No edema.  Skin:    General: Skin is warm and dry.  Neurological:     General: No focal deficit present.     Mental Status: She is alert. Mental status is at baseline.  Psychiatric:        Mood and Affect: Mood normal.        Behavior: Behavior normal.     ED Results and Treatments Labs (all labs ordered are listed, but only abnormal results are displayed) Labs Reviewed  BASIC METABOLIC PANEL WITH GFR - Abnormal; Notable for the following components:      Result Value   Glucose, Bld 139 (*)  All other components within normal  limits  CBC - Abnormal; Notable for the following components:   RBC 5.42 (*)    Hemoglobin 15.9 (*)    HCT 48.3 (*)    All other components within normal limits  ACETAMINOPHEN  LEVEL - Abnormal; Notable for the following components:   Acetaminophen  (Tylenol ), Serum <10 (*)    All other components within normal limits  SALICYLATE LEVEL - Abnormal; Notable for the following components:   Salicylate Lvl <7.0 (*)    All other components within normal limits  ETHANOL  URINALYSIS, ROUTINE W REFLEX MICROSCOPIC  URINE DRUG SCREEN  TROPONIN T, HIGH SENSITIVITY  TROPONIN T, HIGH SENSITIVITY                                                                                                                          Radiology DG Chest 2 View Result Date: 12/31/2024 CLINICAL DATA:  Chest pain, shortness of breath EXAM: CHEST - 2 VIEW COMPARISON:  August 25, 2023 FINDINGS: The heart size and mediastinal contours are within normal limits. Both lungs are clear. The visualized skeletal structures are unremarkable. IMPRESSION: No active cardiopulmonary disease. Electronically Signed   By: Lynwood Landy Raddle M.D.   On: 12/31/2024 09:31    Pertinent labs & imaging results that were available during my care of the patient were reviewed by me and considered in my medical decision making (see MDM for details).  Medications Ordered in ED Medications - No data to display                                                                                                                                   Procedures Procedures  (including critical care time)  Medical Decision Making / ED Course   MDM:  64 year old presenting to the emergency department with chest pain, suicidal ideation.  Patient is overall very well-appearing, physical examination nonfocal.  Low concern for dangerous cause of chest pain.  She reports chest pain with cough, seems somewhat musculoskeletal.  Symptoms atypical for ACS.  No pleuritic  pain, hypoxia, tachycardia to suggest PE.  Lungs clear, doubt pneumonia or pneumothorax.  Will obtain chest x-ray, troponin.  Patient also reporting suicidal ideation.  She does have history of schizophrenia.  She denies any attempt.  She overall does not seem in any distress and may be component of malingering.  Will reassess.  Clinical Course  as of 12/31/24 1301  Wed Dec 31, 2024  1300 Patient is medically cleared for psychiatric evaluation.  Workup is reassuring.  Troponin negative x 2.  Although patient reported taking aspirin  her salicylate level is reassuringly negative. [WS]    Clinical Course User Index [WS] Francesca Elsie CROME, MD     Additional history obtained: -Additional history obtained from ems -External records from outside source obtained and reviewed including: Chart review including previous notes, labs, imaging, consultation notes including prior notes    Lab Tests: -I ordered, reviewed, and interpreted labs.   The pertinent results include:   Labs Reviewed  BASIC METABOLIC PANEL WITH GFR - Abnormal; Notable for the following components:      Result Value   Glucose, Bld 139 (*)    All other components within normal limits  CBC - Abnormal; Notable for the following components:   RBC 5.42 (*)    Hemoglobin 15.9 (*)    HCT 48.3 (*)    All other components within normal limits  ACETAMINOPHEN  LEVEL - Abnormal; Notable for the following components:   Acetaminophen  (Tylenol ), Serum <10 (*)    All other components within normal limits  SALICYLATE LEVEL - Abnormal; Notable for the following components:   Salicylate Lvl <7.0 (*)    All other components within normal limits  ETHANOL  URINALYSIS, ROUTINE W REFLEX MICROSCOPIC  URINE DRUG SCREEN  TROPONIN T, HIGH SENSITIVITY  TROPONIN T, HIGH SENSITIVITY    Notable for see MDM   EKG   EKG Interpretation Date/Time:  Wednesday December 31 2024 10:49:43 EST Ventricular Rate:  80 PR Interval:  118 QRS  Duration:  106 QT Interval:  410 QTC Calculation: 472 R Axis:   -69  Text Interpretation: Normal sinus rhythm Left anterior fascicular block Minimal voltage criteria for LVH, may be normal variant ( Cornell product ) Septal infarct , age undetermined T wave abnormality, consider anterior ischemia Abnormal ECG When compared with ECG of 25-Aug-2023 06:57, No significant change since last tracing Confirmed by Francesca Elsie ([id number]) on 12/31/2024 11:10:00 AM         Imaging Studies ordered: I ordered imaging studies including CXR On my interpretation imaging demonstrates no acute process I independently visualized and interpreted imaging. I agree with the radiologist interpretation   Medicines ordered and prescription drug management: No orders of the defined types were placed in this encounter.   -I have reviewed the patients home medicines and have made adjustments as needed  Social Determinants of Health:  Diagnosis or treatment significantly limited by social determinants of health: polysubstance abuse and homelessness   Reevaluation: After the interventions noted above, I reevaluated the patient and found that their symptoms have stayed the same  Co morbidities that complicate the patient evaluation  Past Medical History:  Diagnosis Date   Anxiety    Bipolar 1 disorder (HCC)    Depression    Drug abuse (HCC)    Headache    Schizophrenia (HCC)       Dispostion: Disposition decision including need for hospitalization was considered, and patient boarding for psychiatric evaluation     Final Clinical Impression(s) / ED Diagnoses Final diagnoses:  Suicidal ideation  Nonspecific chest pain     This chart was dictated using voice recognition software.  Despite best efforts to proofread,  errors can occur which can change the documentation meaning.     [1]  Social History Tobacco Use   Smoking status: Every Day    Current packs/day: 0.25  Average  packs/day: 0.3 packs/day for 10.0 years (2.5 ttl pk-yrs)    Types: Cigarettes, Cigars   Smokeless tobacco: Never  Vaping Use   Vaping status: Never Used  Substance Use Topics   Alcohol use: Yes    Alcohol/week: 3.0 standard drinks of alcohol    Types: 3 Cans of beer per week    Comment: 8-10 can daily   Drug use: Yes    Types: Marijuana, Cocaine     Comment: occasionally     Francesca Elsie CROME, MD 12/31/24 1301  "

## 2024-12-31 NOTE — ED Notes (Signed)
 [facility] has bed ready for pt after shift change.

## 2024-12-31 NOTE — Progress Notes (Signed)
 Pt has been accepted to [facility] on 12/31/2024 . Bed assignment: 166   Pt meets inpatient criteria per [employer], NP   Report can be called to:[phone]   Pt can arrive pending labs   Care Team Notified: [facility] Cherylynn Ernst, RN,Myranda [place], Paramedic, Wyline [employer], NP

## 2025-01-01 ENCOUNTER — Other Ambulatory Visit: Payer: Self-pay

## 2025-01-01 ENCOUNTER — Encounter (HOSPITAL_COMMUNITY): Payer: Self-pay | Admitting: Behavioral Health

## 2025-01-01 DIAGNOSIS — R45851 Suicidal ideations: Secondary | ICD-10-CM | POA: Diagnosis not present

## 2025-01-01 DIAGNOSIS — F251 Schizoaffective disorder, depressive type: Secondary | ICD-10-CM | POA: Diagnosis not present

## 2025-01-01 DIAGNOSIS — F1410 Cocaine abuse, uncomplicated: Secondary | ICD-10-CM | POA: Diagnosis not present

## 2025-01-01 LAB — LIPID PANEL
Cholesterol: 158 mg/dL (ref 0–200)
HDL: 80 mg/dL
LDL Cholesterol: 68 mg/dL (ref 0–99)
Total CHOL/HDL Ratio: 2 ratio
Triglycerides: 49 mg/dL
VLDL: 10 mg/dL (ref 0–40)

## 2025-01-01 LAB — HEMOGLOBIN A1C
Hgb A1c MFr Bld: 5.9 % — ABNORMAL HIGH (ref 4.8–5.6)
Mean Plasma Glucose: 122.63 mg/dL

## 2025-01-01 LAB — TSH: TSH: 0.36 u[IU]/mL (ref 0.350–4.500)

## 2025-01-01 MED ORDER — QUETIAPINE FUMARATE 100 MG PO TABS
100.0000 mg | ORAL_TABLET | Freq: Every day | ORAL | Status: DC
  Filled 2025-01-01 (×2): qty 1

## 2025-01-01 NOTE — Care Management (Signed)
 [facility] Care Management...  [employer] called to complete a phone interview with [name].  Writer provided the client with [facility] phone number: [phone].  Client reports she will call shortly.

## 2025-01-01 NOTE — ED Notes (Signed)
 Pt A&O x 4, anxious presents with SI, no plan noted and visual hallucinations.  Pt fidgety and restless.,  Comfort measures given.  Monitoring for safety.

## 2025-01-01 NOTE — ED Notes (Signed)
 Pt sleeping at present, no distress noted.  Monitoring for safety.

## 2025-01-01 NOTE — Care Management (Signed)
 [facility] Care Management.Scientist, Research (medical) met with the client to discuss discharge planning.  Writer informed the client the average stay at [facility] is 3-5 days.  Client reports her primary drug of choice is: cocaine .  Client has been diagnosed with schizophrenia, MDD, and bipolar.  Client also informed providers on arrival she has visual hallucinations.    Client reported relapsing on cocaine  a week ago and used alcohol two days ago.  Client reports she wants to attend residential treatment for substance abuse and mental health.  Client reports possibly in February she wants to go [place] .  Writer discussed with the client residential treatment at [facility].  Writer faxed a referral to [facility].  Client reported to providers she's been non compliant with medications and has not taken her medications for years.

## 2025-01-01 NOTE — Group Note (Signed)
 Date:  01/01/2025 Time:  3:13 PM  Group Topic/Focus:  Building Self Esteem:   The Focus of this group is helping patients become aware of the effects of self-esteem on their lives, the things they and others do that enhance or undermine their self-esteem, seeing the relationship between their level of self-esteem and the choices they make and learning ways to enhance self-esteem. Conflict Resolution:   The focus of this group is to discuss the conflict resolution process and how it may be used upon discharge. Goals Group:   The focus of this group is to help patients establish daily goals to achieve during treatment and discuss how the patient can incorporate goal setting into their daily lives to aide in recovery. Self Care:   The focus of this group is to help patients understand the importance of self-care in order to improve or restore emotional, physical, spiritual, interpersonal, and financial health.    Participation Level:  Active  Participation Quality:  Attentive  Affect:  Appropriate  Cognitive:  Appropriate  Insight: Appropriate  Engagement in Group:  Engaged  Modes of Intervention:  Discussion  Additional Comments:  I was able to do an individual interview with patient. Patient was positive and open to thinking outside the box with future goals.   [name] [name] 01/01/2025, 3:13 PM

## 2025-01-01 NOTE — ED Provider Notes (Signed)
 Behavioral Health Progress Note  Date and Time: 01/01/2025 11:58 AM Name: Jerrianne Hartin MRN:  [id number]  Subjective:  Suzen People, 64 y.o., female admitted to [facility] facility based crisis unit from [facility] urgency department on 12/31/2024 for substance abuse treatment and suicidal ideations with visual hallucinations.  Patient seen face to face by this provider, and chart reviewed on 01/01/2025.  Per chart review patient has a past psychiatric history of anxiety, schizoaffective disorder, cocaine  use and bipolar disorder.  No pertinent medical history.  Patient not currently taking medications or seeing outpatient provider.  Upon admission BAL was negative and UDS positive for cocaine .  Most recent inpatient psychiatric hospitalization was at [facility] in 2022.  Patient was previously prescribed Seroquel  50 mg daily and Seroquel  200 mg nightly but states that she has not taken this in a while.  On evaluation Semira Stoltzfus reports not being able to sleep consistently throughout the night last night but does have a good appetite.  She denies having auditory hallucinations and homicidal ideations.  She continues to endorse having ongoing suicidal ideations without plan or intent.  Patient also reports ongoing visual hallucinations of people looking at me or looking at my food.  She reports that the visual hallucinations started around the same time she started using cocaine , 1 week ago.  Patient reports that she relapsed on crack cocaine  1 week ago as a way to cope with feeling lonely here in [place].  Patient states that she plans to move back to [place]  in February to be around with people.  She reports using cocaine  for about 10 years off and on.  She states that she last used cocaine  2 days ago. She reports occasional alcohol use. Pt reports depressive symptoms including sadness, hopelessness, worthlessness, irritability, decreased energy, and decreased  sleep.  Patient currently lives with her ex-boyfriend and is unemployed but is receiving disability.  Discussed plan to increase her Seroquel  from 50 mg to 100 mg to help with sleep, mood and psychotic symptoms.  Patient's previous therapeutic dosage was at Seroquel  50 mg daily and 200 mg nightly.  Patient states that she is hoping to complete a residential substance abuse treatment program.  During evaluation Zita Ozimek is lying down in bed, in no acute distress. She is alert/oriented x 4; calm/cooperative; and mood congruent with affect.  She is speaking in a clear tone at moderate volume, and normal pace; with good eye contact.  Her thought process is coherent and relevant; There is no objective indication that she is currently responding to internal/external stimuli however, patient reports visual hallucinations of people. She has denied suicidal ideation,self-harm, homicidal ideation, auditory hallucinations and/or paranoia. Patient has remained calm throughout assessment and has answered questions appropriately.     Diagnosis:  Final diagnoses:  Cocaine  use disorder (HCC)  Schizoaffective disorder, depressive type (HCC)  Suicidal ideation    Total Time spent with patient: 20 minutes  Past Psychiatric History: Bipolar disorder, schizophrenia, substance abuse Past Medical History:  Past Medical History:  Diagnosis Date   Anxiety    Bipolar 1 disorder (HCC)    Depression    Drug abuse (HCC)    Headache    Schizophrenia (HCC)     Family History:  Family History  Problem Relation Age of Onset   Mental illness Maternal Aunt    Cancer Maternal Aunt        breast    Mental illness Paternal Aunt    Anesthesia problems Neg  Hx    Hypotension Neg Hx    Malignant hyperthermia Neg Hx    Pseudochol deficiency Neg Hx     Family Psychiatric  History: Patient reports history of substance abuse on her father side of the family. Social History: Lives with her ex-boyfriend. Plans to  return back to [place]  at the beginning of February. She states that she has 2 adult children She is unemployed and receives disability. She denies legal issues. Reports cocaine  use.   Additional Social History:                         Sleep: Fair  Appetite:  Good  Current Medications:  Current Facility-Administered Medications  Medication Dose Route Frequency Provider Last Rate Last Admin   acetaminophen  (TYLENOL ) tablet 650 mg  650 mg Oral Q6H PRN White, Patrice L, NP   650 mg at 01/01/25 0611   alum & mag hydroxide-simeth (MAALOX/MYLANTA) 200-200-20 MG/5ML suspension 30 mL  30 mL Oral Q4H PRN White, Patrice L, NP       hydrOXYzine  (ATARAX ) tablet 25 mg  25 mg Oral TID PRN White, Patrice L, NP       magnesium  hydroxide (MILK OF MAGNESIA) suspension 30 mL  30 mL Oral Daily PRN White, Patrice L, NP       OLANZapine  (ZYPREXA ) injection 5 mg  5 mg Intramuscular TID PRN White, Patrice L, NP       OLANZapine  zydis (ZYPREXA ) disintegrating tablet 5 mg  5 mg Oral TID PRN White, Patrice L, NP       QUEtiapine  (SEROQUEL ) tablet 50 mg  50 mg Oral QHS White, Patrice L, NP       Current Outpatient Medications  Medication Sig Dispense Refill   famotidine  (PEPCID ) 20 MG tablet Take 1 tablet (20 mg total) by mouth 2 (two) times daily. (Patient not taking: Reported on 11/14/2023) 30 tablet 0   naproxen  (NAPROSYN ) 375 MG tablet Take 1 tablet (375 mg total) by mouth 2 (two) times daily with a meal. (Patient not taking: Reported on 11/14/2023) 20 tablet 0   nicotine  (NICODERM CQ  - DOSED IN MG/24 HR) 7 mg/24hr patch Place 1 patch (7 mg total) onto the skin daily. (Patient not taking: Reported on 11/14/2023) 28 patch 0   QUEtiapine  (SEROQUEL ) 200 MG tablet Take 1 tablet (200 mg total) by mouth at bedtime. (Patient not taking: Reported on 12/31/2024) 30 tablet 0   QUEtiapine  (SEROQUEL ) 50 MG tablet Take 1 tablet (50 mg total) by mouth daily. (Patient not taking: Reported on 12/31/2024) 30 tablet 0    QUEtiapine  Fumarate (SEROQUEL  PO) Take 1 tablet by mouth at bedtime as needed (for sleep).      Labs  Lab Results:  Admission on 12/31/2024  Component Date Value Ref Range Status   Hgb A1c MFr Bld 01/01/2025 5.9 (H)  4.8 - 5.6 % Final   Comment: (NOTE) Diagnosis of Diabetes The following HbA1c ranges recommended by the American Diabetes Association (ADA) may be used as an aid in the diagnosis of diabetes mellitus.  Hemoglobin             Suggested A1C NGSP%              Diagnosis  <5.7                   Non Diabetic  5.7-6.4                Pre-Diabetic  >  6.4                   Diabetic  <7.0                   Glycemic control for                       adults with diabetes.     Mean Plasma Glucose 01/01/2025 122.63  mg/dL Final   Performed at [facility], [address], [place], [place] [address]   Cholesterol 01/01/2025 158  0 - 200 mg/dL Final   Comment:        ATP III CLASSIFICATION:  <200     mg/dL   Desirable  799-760  mg/dL   Borderline High  >=759    mg/dL   High           Triglycerides 01/01/2025 49  <150 mg/dL Final   HDL 98/91/7973 80  >40 mg/dL Final   Total CHOL/HDL Ratio 01/01/2025 2.0  RATIO Final   VLDL 01/01/2025 10  0 - 40 mg/dL Final   LDL Cholesterol 01/01/2025 68  0 - 99 mg/dL Final   Comment:        Total Cholesterol/HDL:CHD Risk Coronary Heart Disease Risk Table                     Men   Women  1/2 Average Risk   3.4   3.3  Average Risk       5.0   4.4  2 X Average Risk   9.6   7.1  3 X Average Risk  23.4   11.0        Use the calculated Patient Ratio above and the CHD Risk Table to determine the patient's CHD Risk.        ATP III CLASSIFICATION (LDL):  <100     mg/dL   Optimal  899-870  mg/dL   Near or Above                    Optimal  130-159  mg/dL   Borderline  839-810  mg/dL   High  >809     mg/dL   Very High Performed at [facility], [address] [name], [place] [address]    TSH 01/01/2025 0.360  0.350 -  4.500 uIU/mL Final   Performed at [facility], [address], [place], [place] [address]  Admission on 12/31/2024, Discharged on 12/31/2024  Component Date Value Ref Range Status   Sodium 12/31/2024 141  135 - 145 mmol/L Final   Potassium 12/31/2024 3.6  3.5 - 5.1 mmol/L Final   Chloride 12/31/2024 102  98 - 111 mmol/L Final   CO2 12/31/2024 25  22 - 32 mmol/L Final   Glucose, Bld 12/31/2024 139 (H)  70 - 99 mg/dL Final   Glucose reference range applies only to samples taken after fasting for at least 8 hours.   BUN 12/31/2024 17  8 - 23 mg/dL Final   Creatinine, Ser 12/31/2024 0.79  0.44 - 1.00 mg/dL Final   Calcium  12/31/2024 10.1  8.9 - 10.3 mg/dL Final   GFR, Estimated 12/31/2024 >60  >60 mL/min Final   Comment: (NOTE) Calculated using the CKD-EPI Creatinine Equation (2021)    Anion gap 12/31/2024 14  5 - 15 Final   Performed at [facility], [address] [place], [place]  [address]   WBC 12/31/2024 9.1  4.0 - 10.5 K/uL Final   RBC 12/31/2024 5.42 (H)  3.87 - 5.11 MIL/uL Final   Hemoglobin 12/31/2024 15.9 (H)  12.0 - 15.0 g/dL Final   HCT 98/92/7973 48.3 (H)  36.0 - 46.0 % Final   MCV 12/31/2024 89.1  80.0 - 100.0 fL Final   MCH 12/31/2024 29.3  26.0 - 34.0 pg Final   MCHC 12/31/2024 32.9  30.0 - 36.0 g/dL Final   RDW 98/92/7973 13.8  11.5 - 15.5 % Final   Platelets 12/31/2024 398  150 - 400 K/uL Final   nRBC 12/31/2024 0.0  0.0 - 0.2 % Final   Performed at [facility], [address], [place], [place] [address]   Troponin T High Sensitivity 12/31/2024 16  0 - 19 ng/L Final   Comment: (NOTE) Biotin concentrations > 1000 ng/mL falsely decrease TnT results.  Serial cardiac troponin measurements are suggested.  Refer to the Links section for chest pain algorithms and additional  guidance. Performed at [facility], [address] [place], [place] [address]    Color, Urine 12/31/2024 YELLOW  YELLOW Final    APPearance 12/31/2024 HAZY (A)  CLEAR Final   Specific Gravity, Urine 12/31/2024 1.023  1.005 - 1.030 Final   pH 12/31/2024 5.0  5.0 - 8.0 Final   Glucose, UA 12/31/2024 >=500 (A)  NEGATIVE mg/dL Final   Hgb urine dipstick 12/31/2024 MODERATE (A)  NEGATIVE Final   Bilirubin Urine 12/31/2024 NEGATIVE  NEGATIVE Final   Ketones, ur 12/31/2024 NEGATIVE  NEGATIVE mg/dL Final   Protein, ur 98/92/7973 NEGATIVE  NEGATIVE mg/dL Final   Nitrite 98/92/7973 NEGATIVE  NEGATIVE Final   Leukocytes,Ua 12/31/2024 NEGATIVE  NEGATIVE Final   RBC / HPF 12/31/2024 0-5  0 - 5 RBC/hpf Final   WBC, UA 12/31/2024 0-5  0 - 5 WBC/hpf Final   Bacteria, UA 12/31/2024 MANY (A)  NONE SEEN Final   Squamous Epithelial / HPF 12/31/2024 0-5  0 - 5 /HPF Final   Mucus 12/31/2024 PRESENT   Final   Performed at [facility], [address] [place], [place] [address]   Opiates 12/31/2024 NEGATIVE  NEGATIVE Final   Cocaine  12/31/2024 POSITIVE (A)  NEGATIVE Final   Benzodiazepines 12/31/2024 NEGATIVE  NEGATIVE Final   Amphetamines 12/31/2024 NEGATIVE  NEGATIVE Final   Tetrahydrocannabinol 12/31/2024 NEGATIVE  NEGATIVE Final   Barbiturates 12/31/2024 NEGATIVE  NEGATIVE Final   Methadone Scn, Ur 12/31/2024 NEGATIVE  NEGATIVE Final   Fentanyl 12/31/2024 NEGATIVE  NEGATIVE Final   Comment: (NOTE) Drug screen is for Medical Purposes only. Positive results are preliminary only. If confirmation is needed, notify lab within 5 days.  Drug Class                 Cutoff (ng/mL) Amphetamine and metabolites 1000 Barbiturate and metabolites 200 Benzodiazepine              200 Opiates and metabolites     300 Cocaine  and metabolites     300 THC                         50 Fentanyl                    5 Methadone                   300  Trazodone  is metabolized in vivo to several metabolites,  including pharmacologically active m-CPP, which is excreted in the  urine.  Immunoassay screens for amphetamines and MDMA  have potential  cross-reactivity with these compounds and may provide false positive  result.  Performed at [facility], [address], [place], [place] [address]    Acetaminophen  (Tylenol ), Serum 12/31/2024 <10 (L)  10 - 30 ug/mL Final   Comment: (NOTE) Toxic concentrations can be more effectively related to post dose interval; >200, >100, and >50 ug/mL serum concentrations correspond to toxic concentrations at 4, 8, and 12 hours post dose, respectively.  Performed at [facility], [address], [place], [place] [address]    Salicylate Lvl 12/31/2024 <7.0 (L)  7.0 - 30.0 mg/dL Final   Performed at [facility], [address] [place], [place] [address]   Alcohol, Ethyl (B) 12/31/2024 <15  <15 mg/dL Final   Comment: (NOTE) For medical purposes only. Performed at [facility], [address] [place], [place] [address]    Troponin T High Sensitivity 12/31/2024 <15  0 - 19 ng/L Final   Comment: (NOTE) Biotin concentrations > 1000 ng/mL falsely decrease TnT results.  Serial cardiac troponin measurements are suggested.  Refer to the Links section for chest pain algorithms and additional  guidance. Performed at [facility], [address], [place], [place] [address]     Blood Alcohol level:  Lab Results  Component Value Date   [facility] <15 12/31/2024   ETH <10 11/12/2022    Metabolic Disorder Labs: Lab Results  Component Value Date   HGBA1C 5.9 (H) 01/01/2025   MPG 122.63 01/01/2025   MPG 111.15 04/30/2021   Lab Results  Component Value Date   PROLACTIN 8.9 04/07/2018   PROLACTIN 14.3 09/23/2015   Lab Results  Component Value Date   CHOL 158 01/01/2025   TRIG 49 01/01/2025   HDL 80 01/01/2025   CHOLHDL 2.0 01/01/2025   VLDL 10 01/01/2025   LDLCALC 68 01/01/2025   LDLCALC 51 04/30/2021    Therapeutic Lab Levels: No results found for: LITHIUM Lab Results   Component Value Date   VALPROATE 117.9 (H) 05/27/2009   VALPROATE 116.9 (H) 05/26/2009   No results found for: CBMZ  Physical Findings   AIMS    Flowsheet Row Admission (Discharged) from 04/30/2020 [facility] Admission (Discharged) from OP Visit from 10/03/2019 in [facility] Admission (Discharged) from OP [facility] Admission (Discharged) from 04/02/[facility] Admission (Discharged) from 05/17[facility]2016 [facility]  AIMS Total Score 0 0 0 0 0   AUDIT    Flowsheet Row Admission (Discharged) from 04/30/2020 in [facility] Admission (Discharged) from OP Visit from 11/05/2018 [facility] Admission (Discharged) from 04/02/2018 [facility] Admission (Discharged) from 05/17/2016 [facility] Admission (Discharged) from 09/21/2015 [facility]  Alcohol Use Disorder Identification Test Final Score (AUDIT) 27 39 2 10 2    PHQ2-9    Flowsheet Row ED from 12/31/2024 in [facility] Most recent reading at 01/01/2025 11:33 AM ED from 12/31/2024 in [facility] Most recent reading at 12/31/2024  9:22 PM Office Visit from 05/31/2018 in [facility] Most  recent reading at 05/31/2018  1:15 PM Office Visit from 03/01/2017 in [facility] Most recent reading at 03/01/2017  2:52 PM Office Visit from 02/15/2016 in [facility] - [facility] Most recent reading at 02/15/2016  9:36 AM  PHQ-2 Total Score 2 2 1  0 0  PHQ-9 Total Score 9 8 -- -- --   Flowsheet Row ED from  12/31/2024 in [facility] Most recent reading at 01/01/2025  1:18 AM ED from 12/31/2024 in [facility] Emergency Department at [facility] Most recent reading at 12/31/2024  8:52 AM ED from 11/14/2023 in [facility] Emergency Department at [facility] Most recent reading at 11/14/2023  1:11 AM  C-SSRS RISK CATEGORY Low Risk Low Risk No Risk     Musculoskeletal  Strength & Muscle Tone: within normal limits Gait & Station: normal Patient leans: N/A  Psychiatric Specialty Exam  Presentation  General Appearance:  Casual  Eye Contact: Poor  Speech: Clear and Coherent; Normal Rate  Speech Volume: Normal  Handedness: Right   Mood and Affect  Mood: Depressed; Anxious  Affect: Congruent; Depressed   Thought Process  Thought Processes: Coherent  Descriptions of Associations:Intact  Orientation:Full (Time, Place and Person)  Thought Content:WDL  Diagnosis of Schizophrenia or Schizoaffective disorder in past: No data recorded Duration of Psychotic Symptoms: No data recorded  Hallucinations:Hallucinations: Visual Description of Visual Hallucinations: People looking at my food  Ideas of Reference:None  Suicidal Thoughts:Suicidal Thoughts: Yes, Passive SI Passive Intent and/or Plan: Without Intent; Without Plan  Homicidal Thoughts:Homicidal Thoughts: No   Sensorium  Memory: Immediate Fair; Recent Poor  Judgment: Poor  Insight: Shallow   Executive Functions  Concentration: Fair  Attention Span: Fair  Recall: Fair  Fund of Knowledge: Fair  Language: Fair   Psychomotor Activity  Psychomotor Activity: Psychomotor Activity: Restlessness; Increased   Assets  Assets: Communication Skills; Desire for Improvement; Financial Resources/Insurance; Physical Health; Resilience   Sleep  Sleep: Sleep: Fair  Estimated Sleeping Duration (Last 24 Hours): 7.00-9.25 hours  Nutritional Assessment (For OBS and  [facility] admissions only) Has the patient had a weight loss or gain of 10 pounds or more in the last 3 months?: No Has the patient had a decrease in food intake/or appetite?: No Does the patient have dental problems?: No Does the patient have eating habits or behaviors that may be indicators of an eating disorder including binging or inducing vomiting?: No Has the patient recently lost weight without trying?: 0 Has the patient been eating poorly because of a decreased appetite?: 0 Malnutrition Screening Tool Score: 0    Physical Exam  Physical Exam Vitals and nursing note reviewed.  Constitutional:      Appearance: Normal appearance.  HENT:     Head: Normocephalic.     Nose: Nose normal.  Cardiovascular:     Rate and Rhythm: Normal rate.  Pulmonary:     Effort: Pulmonary effort is normal.  Musculoskeletal:        General: Normal range of motion.     Cervical back: Normal range of motion.  Neurological:     General: No focal deficit present.     Mental Status: She is alert and oriented to person, place, and time.    Review of Systems  Constitutional: Negative.   HENT: Negative.    Eyes: Negative.   Respiratory: Negative.  Cardiovascular: Negative.   Gastrointestinal: Negative.   Genitourinary: Negative.   Musculoskeletal: Negative.   Neurological: Negative.   Endo/Heme/Allergies: Negative.   Psychiatric/Behavioral:  Positive for depression, hallucinations, substance abuse and suicidal ideas.    Blood pressure 90/75, pulse 83, temperature 99.1 F (37.3 C), temperature source Oral, resp. rate 20, SpO2 100%. There is no height or weight on file to calculate BMI.  Treatment Plan Summary: Daily contact with patient to assess and evaluate symptoms and progress in treatment and Medication management Patient continues to endorse having ongoing suicidal ideation, depressive symptoms and visual hallucinations this morning.  She has a history of schizoaffective disorder, depressive  type and has previously reached therapeutic levels of Seroquel  at 50 mg daily and 200 mg nightly.  Patient reports that the onset of visual hallucinations coincides with her usage of cocaine  1 week ago however, patient reports history of hallucinations even while sober.  -Will increase Seroquel  from 50 mg nightly to 100 mg nightly to help sleep, stabilize mood and decrease psychotic symptoms.  -Patient has been accepted to [facility] treatment center and is awaiting admission/transportation date (likely tomorrow or Saturday).   - Patient has been engaged and attending group programming.   - Labs reviewed: UDS positive for cocaine  as reported and A1c slightly elevated at 5.9, will encourage patient to follow-up with PCP to monitor A1c.  Discussed healthy diet and physical activity.  Alan JAYSON Mcardle, NP 01/01/2025 11:58 AM

## 2025-01-01 NOTE — ED Notes (Signed)
 Pt sleeping in no acute distress. RR even and unlabored. Environment secured. Will continue to monitor for safety.

## 2025-01-01 NOTE — ED Notes (Signed)
 Patient asleep at this time. NAD Will keep monitoring patient for safety

## 2025-01-01 NOTE — Group Note (Signed)
 Group Topic: Communication  Group Date: 01/01/2025 Start Time: 1705 End Time: 1730 Facilitators: Herold Lajuana NOVAK, RN  Department: [facility]  Number of Participants: 10  Group Focus: other unit rules  Treatment Modality:  Individual Therapy Interventions utilized were clarification Purpose: increase insight  Name: [name] Date of Birth: [birth year]  MR: [id number]    Level of Participation: active Quality of Participation: attentive Interactions with others: gave feedback Mood/Affect: appropriate Triggers (if applicable): none identified Cognition: coherent/clear Progress: Gaining insight Response: Pt verbalized understanding of rules of unit Plan: patient will be encouraged to remain tx compliant to rules of unit Patients Problems:  Patient Active Problem List   Diagnosis Date Noted   Substance induced mood disorder (HCC) 12/31/2024   Cocaine  dependence, continuous use (HCC) 04/30/2020   Schizoaffective disorder (HCC) 11/05/2018   Bacterial vaginosis 03/01/2017   Schizoaffective disorder, bipolar type (HCC) 05/18/2016   Annual physical exam 02/15/2016   Encounter for routine gynecological examination 02/15/2016   Breast cancer screening 02/15/2016   Affective psychosis, bipolar (HCC)    Cannabis use disorder, moderate, dependence (HCC) 09/22/2015   Cocaine  use disorder, severe, dependence (HCC) 09/22/2015   Suicidal ideation    Lower urinary tract infectious disease 09/04/2012   Cocaine  abuse (HCC) 12/07/2011

## 2025-01-01 NOTE — Group Note (Signed)
 Group Topic: Emotional Regulation  Group Date: 01/01/2025 Start Time: 9251 End Time: 2018 Facilitators: Bryan Saddie CROME, [place]  Department: [facility]  Number of Participants: 8  Group Focus: anger management Treatment Modality:  Psychoeducation Interventions utilized were group exercise Purpose: enhance coping skills and express feelings  Name: [name] Date of Birth: 03-12-1961  MR: [id number]    Level of Participation: Did not attend Quality of Participation: N/A Interactions with others: N/A Mood/Affect: N/A Triggers (if applicable): N/A Cognition: N/A Progress: None Response: N/A Plan: patient will be encouraged to attend next group  Patients Problems:  Patient Active Problem List   Diagnosis Date Noted   Substance induced mood disorder (HCC) 12/31/2024   Cocaine  dependence, continuous use (HCC) 04/30/2020   Schizoaffective disorder (HCC) 11/05/2018   Bacterial vaginosis 03/01/2017   Schizoaffective disorder, bipolar type (HCC) 05/18/2016   Annual physical exam 02/15/2016   Encounter for routine gynecological examination 02/15/2016   Breast cancer screening 02/15/2016   Affective psychosis, bipolar (HCC)    Cannabis use disorder, moderate, dependence (HCC) 09/22/2015   Cocaine  use disorder, severe, dependence (HCC) 09/22/2015   Suicidal ideation    Lower urinary tract infectious disease 09/04/2012   Cocaine  abuse (HCC) 12/07/2011

## 2025-01-01 NOTE — Care Management (Signed)
 [facility] Care Management...   Patient completed phone interview with Powell at [facility]  Per Powell, she will verify patients insurance and return call to arrange transportation

## 2025-01-01 NOTE — Progress Notes (Signed)
 Patient has been accepted to [facility]  Stephanie Luisdaniel Kenton LCSW  01/01/2025 2:34 PM

## 2025-01-01 NOTE — ED Notes (Signed)
 Patient A&Ox4. Denies intent to harm self/others when asked. Denies A/VH at present. Ate bkft without difficulty. Patient denies any physical complaints when asked. No acute distress noted. Support and encouragement provided. Routine safety checks conducted according to facility protocol. Encouraged patient to notify staff if thoughts of harm toward self or others arise. Patient verbalize understanding and agreement. Will continue to monitor for safety.

## 2025-01-01 NOTE — ED Notes (Signed)
 Patient is in the bedroom calm and composed, NAD. C/o of HA Respirations even and unlabored. Environment secured per policy. Will monitor for safety.

## 2025-01-01 NOTE — Care Plan (Signed)
 Interdisciplinary Treatment and Diagnostic Plan Update  01/01/2025 Time of Session: 2 PM [name] MRN: [id number]  Diagnosis:  Final diagnoses:  Cocaine  use disorder (HCC)  Schizoaffective disorder, depressive type (HCC)  Suicidal ideation     Current Medications:  Current Facility-Administered Medications  Medication Dose Route Frequency Provider Last Rate Last Admin   acetaminophen  (TYLENOL ) tablet 650 mg  650 mg Oral Q6H PRN White, Patrice L, NP   650 mg at 01/01/25 0611   alum & mag hydroxide-simeth (MAALOX/MYLANTA) 200-200-20 MG/5ML suspension 30 mL  30 mL Oral Q4H PRN White, Patrice L, NP       hydrOXYzine  (ATARAX ) tablet 25 mg  25 mg Oral TID PRN White, Patrice L, NP       magnesium  hydroxide (MILK OF MAGNESIA) suspension 30 mL  30 mL Oral Daily PRN White, Patrice L, NP       OLANZapine  (ZYPREXA ) injection 5 mg  5 mg Intramuscular TID PRN White, Patrice L, NP       OLANZapine  zydis (ZYPREXA ) disintegrating tablet 5 mg  5 mg Oral TID PRN White, Patrice L, NP       QUEtiapine  (SEROQUEL ) tablet 100 mg  100 mg Oral QHS Thresa Palma C, NP       Current Outpatient Medications  Medication Sig Dispense Refill   famotidine  (PEPCID ) 20 MG tablet Take 1 tablet (20 mg total) by mouth 2 (two) times daily. (Patient not taking: Reported on 11/14/2023) 30 tablet 0   naproxen  (NAPROSYN ) 375 MG tablet Take 1 tablet (375 mg total) by mouth 2 (two) times daily with a meal. (Patient not taking: Reported on 11/14/2023) 20 tablet 0   nicotine  (NICODERM CQ  - DOSED IN MG/24 HR) 7 mg/24hr patch Place 1 patch (7 mg total) onto the skin daily. (Patient not taking: Reported on 11/14/2023) 28 patch 0   QUEtiapine  (SEROQUEL ) 200 MG tablet Take 1 tablet (200 mg total) by mouth at bedtime. (Patient not taking: Reported on 12/31/2024) 30 tablet 0   QUEtiapine  (SEROQUEL ) 50 MG tablet Take 1 tablet (50 mg total) by mouth daily. (Patient not taking: Reported on 12/31/2024) 30 tablet 0   QUEtiapine  Fumarate  (SEROQUEL  PO) Take 1 tablet by mouth at bedtime as needed (for sleep).     PTA Medications: Prior to Admission medications  Medication Sig Start Date End Date Taking? Authorizing Provider  famotidine  (PEPCID ) 20 MG tablet Take 1 tablet (20 mg total) by mouth 2 (two) times daily. Patient not taking: Reported on 11/14/2023 07/21/22   Kehrli, Kelsey F, PA-C  naproxen  (NAPROSYN ) 375 MG tablet Take 1 tablet (375 mg total) by mouth 2 (two) times daily with a meal. Patient not taking: Reported on 11/14/2023 09/30/22   Harris, Abigail, PA-C  nicotine  (NICODERM CQ  - DOSED IN MG/24 HR) 7 mg/24hr patch Place 1 patch (7 mg total) onto the skin daily. Patient not taking: Reported on 11/14/2023 11/14/22   Stephanie Pfeiffer, MD  QUEtiapine  (SEROQUEL ) 200 MG tablet Take 1 tablet (200 mg total) by mouth at bedtime. Patient not taking: Reported on 12/31/2024 11/13/22 12/31/24  Stephanie Pfeiffer, MD  QUEtiapine  (SEROQUEL ) 50 MG tablet Take 1 tablet (50 mg total) by mouth daily. Patient not taking: Reported on 12/31/2024 11/14/22 12/31/24  Stephanie Pfeiffer, MD  QUEtiapine  Fumarate (SEROQUEL  PO) Take 1 tablet by mouth at bedtime as needed (for sleep).    [provider]    Patient Stressors: Medication change or noncompliance   Substance abuse    Patient Strengths: Ability for  insight  Motivation for treatment/growth   Treatment Modalities: Medication Management, Group therapy, Case management,  1 to 1 session with clinician, Psychoeducation, Recreational therapy.   Physician Treatment Plan for Primary and Secondary Diagnosis:  Final diagnoses:  Cocaine  use disorder (HCC)  Schizoaffective disorder, depressive type (HCC)  Suicidal ideation   Long Term Goal(s):    Short Term Goals:    Medication Management: Evaluate patient's response, side effects, and tolerance of medication regimen.  Therapeutic Interventions: 1 to 1 sessions, Unit Group sessions and Medication administration.  Evaluation of  Outcomes: Progressing  LCSW Treatment Plan for Primary Diagnosis:  Final diagnoses:  Cocaine  use disorder (HCC)  Schizoaffective disorder, depressive type (HCC)  Suicidal ideation    Long Term Goal(s): Safe transition to appropriate next level of care at discharge.  Short Term Goals: Facilitate acceptance of mental health diagnosis and concerns through verbal commitment to aftercare plan and appointments at discharge.  Therapeutic Interventions: Assess for all discharge needs, 1 to 1 time with Child psychotherapist, Explore available resources and support systems, Assess for adequacy in community support network, Educate family and significant other(s) on suicide prevention, Complete Psychosocial Assessment, Interpersonal group therapy.  Evaluation of Outcomes: Progressing   Progress in Treatment: Attending groups: Yes. Participating in groups: Yes. Taking medication as prescribed: Yes. Toleration medication: No. Family/Significant other contact made: No Patient understands diagnosis: Yes. Discussing patient identified problems/goals with staff: Yes. Medical problems stabilized or resolved: Yes. Denies suicidal/homicidal ideation: Yes. Issues/concerns per patient self-inventory: No. Other: Client reported today no SI/HI and or plans.     New problem(s) identified: No, Describe:  NA  New Short Term/Long Term Goal(s): Client reports she wants to detox at [facility] and enter into residential treatment after.  Patient Goals:  Client will complete detox at [facility] and will go directly to [facility] upon discharge from [facility].  Client will go either by safe transport or [employer] will pick her up to avoid recidivism and to increase sobriety.   Discharge Plan or Barriers: Client reports no barriers to her discharge plan.  Reason for Continuation of Hospitalization: Anxiety Depression Medication stabilization Withdrawal symptoms To complete door to door transfer  for the client  Estimated Length of Stay:12/31/24 -01/03/25 ( or earlier if [facility] has availability)   Last 3 [place] Suicide Severity Risk Score: Flowsheet Row ED from 12/31/2024 in [facility] Most recent reading at 01/01/2025  1:18 AM ED from 12/31/2024 in [facility] Department at [facility] Most recent reading at 12/31/2024  8:52 AM ED from 11/14/2023 in [facility] Emergency Department at [facility] Most recent reading at 11/14/2023  1:11 AM  C-SSRS RISK CATEGORY Low Risk Low Risk No Risk    Last PHQ 2/9 Scores:    01/01/2025   11:33 AM 12/31/2024    9:22 PM 05/31/2018    1:15 PM  Depression screen PHQ 2/9  Decreased Interest 1 1 0  Down, Depressed, Hopeless 1 1 1   PHQ - 2 Score 2 2 1   Altered sleeping 1 0   Tired, decreased energy 1 0   Change in appetite 0 1   Feeling bad or failure about yourself  2 2   Trouble concentrating 1 1   Moving slowly or fidgety/restless 1 1   Suicidal thoughts 1 1   PHQ-9 Score 9 8   Difficult doing work/chores Very difficult      Scribe for Treatment Team: Poet Hineman, LCSW 01/01/2025 4:54 PM

## 2025-01-02 DIAGNOSIS — F251 Schizoaffective disorder, depressive type: Secondary | ICD-10-CM

## 2025-01-02 DIAGNOSIS — F1410 Cocaine abuse, uncomplicated: Secondary | ICD-10-CM

## 2025-01-02 DIAGNOSIS — R45851 Suicidal ideations: Secondary | ICD-10-CM

## 2025-01-02 MED ORDER — QUETIAPINE FUMARATE 100 MG PO TABS: 100.0000 mg | ORAL_TABLET | Freq: Every day | ORAL | 0 refills | Status: AC

## 2025-01-02 MED ORDER — WHITE PETROLATUM EX OINT
TOPICAL_OINTMENT | CUTANEOUS | Status: AC
  Administered 2025-01-02: 1
  Filled 2025-01-02: qty 5

## 2025-01-02 MED ORDER — DM-GUAIFENESIN ER 30-600 MG PO TB12: 1.0000 | ORAL_TABLET | Freq: Two times a day (BID) | ORAL | 0 refills | Status: AC | PRN

## 2025-01-02 MED ORDER — WHITE PETROLATUM EX OINT: TOPICAL_OINTMENT | CUTANEOUS | Status: DC | PRN

## 2025-01-02 MED ORDER — AZITHROMYCIN 250 MG PO TABS
250.0000 mg | ORAL_TABLET | Freq: Every day | ORAL | Status: DC
  Administered 2025-01-03: 250 mg via ORAL
  Filled 2025-01-02: qty 1

## 2025-01-02 MED ORDER — DM-GUAIFENESIN ER 30-600 MG PO TB12
1.0000 | ORAL_TABLET | Freq: Two times a day (BID) | ORAL | Status: DC | PRN
  Administered 2025-01-02: 1 via ORAL
  Filled 2025-01-02: qty 1

## 2025-01-02 MED ORDER — AZITHROMYCIN 250 MG PO TABS: ORAL_TABLET | ORAL | 0 refills | Status: AC

## 2025-01-02 MED ORDER — IBUPROFEN 400 MG PO TABS
800.0000 mg | ORAL_TABLET | Freq: Three times a day (TID) | ORAL | Status: DC | PRN
  Administered 2025-01-02 (×2): 800 mg via ORAL
  Filled 2025-01-02 (×3): qty 2

## 2025-01-02 MED ORDER — AZITHROMYCIN 250 MG PO TABS
500.0000 mg | ORAL_TABLET | Freq: Once | ORAL | Status: AC
  Administered 2025-01-02: 500 mg via ORAL
  Filled 2025-01-02: qty 2

## 2025-01-02 MED ORDER — IBUPROFEN 800 MG PO TABS: 800.0000 mg | ORAL_TABLET | Freq: Three times a day (TID) | ORAL | 0 refills | Status: AC | PRN

## 2025-01-02 MED ORDER — HYDROXYZINE HCL 25 MG PO TABS: 25.0000 mg | ORAL_TABLET | Freq: Three times a day (TID) | ORAL | 0 refills | Status: AC | PRN

## 2025-01-02 NOTE — ED Notes (Signed)
 Pt generally isolative. Pt ate dinner. No additional concerns or complaints reported to RN since prn's administration tonight.

## 2025-01-02 NOTE — Care Management (Signed)
 [facility] Care Management...  Patient requested inpatient treatment  [facility]...  Patient has been approved for [facility]  [address],  [place], [place] [address] Phone: [phone]  RN will arrange transportation  30 day scripts

## 2025-01-02 NOTE — Group Note (Signed)
 Group Topic: Decisional Balance/Substance Abuse  Group Date: 01/02/2025 Start Time: 1100 End Time: 1200 Facilitators: Magnum Lunde, LCSW  Department: [facility]  Number of Participants: 5  Group Focus: acceptance, art therapy, chemical dependency education, chemical dependency issues, community group, and coping skills Treatment Modality:  Art Therapy and Cognitive Behavioral Therapy Interventions utilized were group exercise, patient education, and support Purpose: enhance coping skills, express feelings, express irrational fears, improve communication skills, regain self-worth, reinforce self-care, relapse prevention strategies, and trigger / craving management  Writer met with the group to explore how they view themselves, how the world views them, and how substance abuse factors into self-worth/perception.  Writer utilized Art Therapy and CBT to explore these topics.  Writer utilized a worksheet were the client had to depict themselves vs how the world views them.  Writer explored triggers and coping skills with the group.  Name: [name] Date of Birth: 06-08-1961  MR: [id number]    Level of Participation: active Quality of Participation: cooperative and engaged Interactions with others: gave feedback Mood/Affect: anxious and brightens with interaction Triggers (if applicable): client reports the passing of her daughter is triggering to her.   Cognition: coherent/clear and goal directed Progress: Moderate Response: Client was open and reports leaving group early due to pain.  Plan: follow-up as needed  Patients Problems:  Patient Active Problem List   Diagnosis Date Noted   Substance induced mood disorder (HCC) 12/31/2024   Cocaine  dependence, continuous use (HCC) 04/30/2020   Schizoaffective disorder (HCC) 11/05/2018   Bacterial vaginosis 03/01/2017   Schizoaffective disorder, bipolar type (HCC) 05/18/2016   Annual physical exam  02/15/2016   Encounter for routine gynecological examination 02/15/2016   Breast cancer screening 02/15/2016   Affective psychosis, bipolar (HCC)    Cannabis use disorder, moderate, dependence (HCC) 09/22/2015   Cocaine  use disorder, severe, dependence (HCC) 09/22/2015   Suicidal ideation    Lower urinary tract infectious disease 09/04/2012   Cocaine  abuse (HCC) 12/07/2011

## 2025-01-02 NOTE — ED Provider Notes (Signed)
 Behavioral Health Progress Note  Date and Time: 01/02/2025 3:44 PM Name: [name] MRN:  [id number]  Subjective:  [name], 64 y.o., female admitted to [facility] facility based crisis unit from [facility] urgency department on 12/31/2024 for substance abuse treatment and suicidal ideations with visual hallucinations.  Patient seen face to face by this provider, and chart reviewed on 01/02/2025.  Per chart review patient has a past psychiatric history of anxiety, schizoaffective disorder, cocaine  use and bipolar disorder.  No pertinent medical history.  Patient not currently taking medications or seeing outpatient provider.  Upon admission BAL was negative and UDS positive for cocaine .  Most recent inpatient psychiatric hospitalization was at [facility] in 2022.  Patient was previously prescribed Seroquel  50 mg daily and Seroquel  200 mg nightly but states that she has not taken this in a while.   During hospitalization, she has progressed well, completed her detox taper, and has exhibited no overt withdrawal symptoms. No psychosis, mania, or depressive episode has been observed. Thought processes remain linear and organized, and she has consistently denied suicidal ideation, homicidal ideation, or auditory/visual hallucinations.  The patient has been intermittently focused on chronic back pain, though she is observed ambulating without difficulty. She reports typically using ibuprofen  for pain management and has been educated on appropriate use of non-opioid analgesics and the risks of sedating medications. She also reports a cough for approximately one week, without associated acute respiratory distress. She remains medication compliant and is actively working toward detoxification goals.  Insight and motivation remain limited, with ongoing focus on housing needs; however, she has engaged in discharge planning. She is scheduled for discharge tomorrow to [facility] for continued substance use treatment. At this time, her symptoms are well managed with medications consistent with standard of care, and safety planning has been completed with the treatment team.  Risk Assessment: At present, the patient does not demonstrate acute risk to self or others. Protective factors outweigh risk factors. She remains appropriate for planned discharge as scheduled.  Diagnosis:  Final diagnoses:  Cocaine  use disorder (HCC)  Schizoaffective disorder, depressive type (HCC)  Suicidal ideation    Total Time spent with patient: 20 minutes  Past Psychiatric History: Bipolar disorder, schizophrenia, substance abuse Past Medical History:  Past Medical History:  Diagnosis Date   Anxiety    Bipolar 1 disorder (HCC)    Depression    Drug abuse (HCC)    Headache    Schizophrenia (HCC)     Family History:  Family History  Problem Relation Age of Onset   Mental illness Maternal Aunt    Cancer Maternal Aunt        breast    Mental illness Paternal Aunt    Anesthesia problems Neg Hx    Hypotension Neg Hx    Malignant hyperthermia Neg Hx    Pseudochol deficiency Neg Hx     Family Psychiatric  History: Patient reports history of substance abuse on her father side of the family. Social History: Lives with her ex-boyfriend. Plans to return back to [place]  at the beginning of February. She states that she has 2 adult children She is unemployed and receives disability. She denies legal issues. Reports cocaine  use.   Additional Social History:                         Sleep: Fair  Appetite:  Good  Current Medications:  Current Facility-Administered Medications  Medication  Dose Route Frequency Provider Last Rate Last Admin   alum & mag hydroxide-simeth (MAALOX/MYLANTA) 200-200-20 MG/5ML suspension 30 mL  30 mL Oral Q4H PRN White, Patrice L, NP       [START ON 01/03/2025] azithromycin  (ZITHROMAX ) tablet 250 mg  250 mg Oral Daily  Starkes-Perry, Stephanie Ryle S, FNP       azithromycin  (ZITHROMAX ) tablet 500 mg  500 mg Oral Once Starkes-Perry, Stephanie Cary S, FNP       dextromethorphan -guaiFENesin  (MUCINEX  DM) 30-600 MG per 12 hr tablet 1 tablet  1 tablet Oral BID PRN Stephanie Stephanie RAMAN, FNP       hydrOXYzine  (ATARAX ) tablet 25 mg  25 mg Oral TID PRN White, Patrice L, NP   25 mg at 01/01/25 2014   ibuprofen  (ADVIL ) tablet 800 mg  800 mg Oral TID PRN Starkes-Perry, Stephanie Servidio S, FNP       magnesium  hydroxide (MILK OF MAGNESIA) suspension 30 mL  30 mL Oral Daily PRN White, Patrice L, NP       OLANZapine  (ZYPREXA ) injection 5 mg  5 mg Intramuscular TID PRN White, Patrice L, NP       OLANZapine  zydis (ZYPREXA ) disintegrating tablet 5 mg  5 mg Oral TID PRN White, Patrice L, NP       QUEtiapine  (SEROQUEL ) tablet 100 mg  100 mg Oral QHS [name], Stephanie C, NP       white petrolatum  (VASELINE) gel            Current Outpatient Medications  Medication Sig Dispense Refill   [START ON 01/03/2025] azithromycin  (ZITHROMAX ) 250 MG tablet Received two doses of a Zpack already 3 each 0   dextromethorphan -guaiFENesin  (MUCINEX  DM) 30-600 MG 12hr tablet Take 1 tablet by mouth 2 (two) times daily as needed for cough. 15 tablet 0   hydrOXYzine  (ATARAX ) 25 MG tablet Take 1 tablet (25 mg total) by mouth 3 (three) times daily as needed for anxiety. 30 tablet 0   ibuprofen  (ADVIL ) 800 MG tablet Take 1 tablet (800 mg total) by mouth 3 (three) times daily as needed for headache, moderate pain (pain score 4-6) or mild pain (pain score 1-3). 30 tablet 0   QUEtiapine  (SEROQUEL ) 100 MG tablet Take 1 tablet (100 mg total) by mouth at bedtime. 30 tablet 0    Labs  Lab Results:  Admission on 12/31/2024  Component Date Value Ref Range Status   Hgb A1c MFr Bld 01/01/2025 5.9 (H)  4.8 - 5.6 % Final   Comment: (NOTE) Diagnosis of Diabetes The following HbA1c ranges recommended by the American Diabetes Association (ADA) may be used as an aid in the diagnosis of diabetes  mellitus.  Hemoglobin             Suggested A1C NGSP%              Diagnosis  <5.7                   Non Diabetic  5.7-6.4                Pre-Diabetic  >6.4                   Diabetic  <7.0                   Glycemic control for                       adults with diabetes.     Mean  Plasma Glucose 01/01/2025 122.63  mg/dL Final   Performed at [facility], [address] [place], [place] [address]   Cholesterol 01/01/2025 158  0 - 200 mg/dL Final   Comment:        ATP III CLASSIFICATION:  <200     mg/dL   Desirable  799-760  mg/dL   Borderline High  >=759    mg/dL   High           Triglycerides 01/01/2025 49  <150 mg/dL Final   HDL 98/91/7973 80  >40 mg/dL Final   Total CHOL/HDL Ratio 01/01/2025 2.0  RATIO Final   VLDL 01/01/2025 10  0 - 40 mg/dL Final   LDL Cholesterol 01/01/2025 68  0 - 99 mg/dL Final   Comment:        Total Cholesterol/HDL:CHD Risk Coronary Heart Disease Risk Table                     Men   Women  1/2 Average Risk   3.4   3.3  Average Risk       5.0   4.4  2 X Average Risk   9.6   7.1  3 X Average Risk  23.4   11.0        Use the calculated Patient Ratio above and the CHD Risk Table to determine the patient'[name] CHD Risk.        ATP III CLASSIFICATION (LDL):  <100     mg/dL   Optimal  899-870  mg/dL   Near or Above                    Optimal  130-159  mg/dL   Borderline  839-810  mg/dL   High  >809     mg/dL   Very High Performed at [facility], [address] [place], [place] [address]    TSH 01/01/2025 0.360  0.350 - 4.500 uIU/mL Final   Performed at [facility], [address] [place], [place] [address]  Admission on 12/31/2024, Discharged on 12/31/2024  Component Date Value Ref Range Status   Sodium 12/31/2024 141  135 - 145 mmol/L Final   Potassium 12/31/2024 3.6  3.5 - 5.1 mmol/L Final   Chloride 12/31/2024 102  98 - 111 mmol/L Final   CO2 12/31/2024 25  22 - 32 mmol/L Final   Glucose, Bld 12/31/2024 139 (H)  70 -  99 mg/dL Final   Glucose reference range applies only to samples taken after fasting for at least 8 hours.   BUN 12/31/2024 17  8 - 23 mg/dL Final   Creatinine, Ser 12/31/2024 0.79  0.44 - 1.00 mg/dL Final   Calcium  12/31/2024 10.1  8.9 - 10.3 mg/dL Final   GFR, Estimated 12/31/2024 >60  >60 mL/min Final   Comment: (NOTE) Calculated using the CKD-EPI Creatinine Equation (2021)    Anion gap 12/31/2024 14  5 - 15 Final   Performed at [facility], [address] [place], [place] [address]   WBC 12/31/2024 9.1  4.0 - 10.5 K/uL Final   RBC 12/31/2024 5.42 (H)  3.87 - 5.11 MIL/uL Final   Hemoglobin 12/31/2024 15.9 (H)  12.0 - 15.0 g/dL Final   HCT 98/92/7973 48.3 (H)  36.0 - 46.0 % Final   MCV 12/31/2024 89.1  80.0 - 100.0 fL Final   MCH 12/31/2024 29.3  26.0 - 34.0 pg Final   MCHC  12/31/2024 32.9  30.0 - 36.0 g/dL Final   RDW 98/92/7973 13.8  11.5 - 15.5 % Final   Platelets 12/31/2024 398  150 - 400 K/uL Final   nRBC 12/31/2024 0.0  0.0 - 0.2 % Final   Performed at [facility], [address] [place], [place] [address]   Troponin T High Sensitivity 12/31/2024 16  0 - 19 ng/L Final   Comment: (NOTE) Biotin concentrations > 1000 ng/mL falsely decrease TnT results.  Serial cardiac troponin measurements are suggested.  Refer to the Links section for chest pain algorithms and additional  guidance. Performed at [facility], [address] [place], [place] [address]    Color, Urine 12/31/2024 YELLOW  YELLOW Final   APPearance 12/31/2024 HAZY (A)  CLEAR Final   Specific Gravity, Urine 12/31/2024 1.023  1.005 - 1.030 Final   pH 12/31/2024 5.0  5.0 - 8.0 Final   Glucose, UA 12/31/2024 >=500 (A)  NEGATIVE mg/dL Final   Hgb urine dipstick 12/31/2024 MODERATE (A)  NEGATIVE Final   Bilirubin Urine 12/31/2024 NEGATIVE  NEGATIVE Final   Ketones, ur 12/31/2024 NEGATIVE  NEGATIVE mg/dL Final   Protein, ur 98/92/7973 NEGATIVE  NEGATIVE  mg/dL Final   Nitrite 98/92/7973 NEGATIVE  NEGATIVE Final   Leukocytes,Ua 12/31/2024 NEGATIVE  NEGATIVE Final   RBC / HPF 12/31/2024 0-5  0 - 5 RBC/hpf Final   WBC, UA 12/31/2024 0-5  0 - 5 WBC/hpf Final   Bacteria, UA 12/31/2024 MANY (A)  NONE SEEN Final   Squamous Epithelial / HPF 12/31/2024 0-5  0 - 5 /HPF Final   Mucus 12/31/2024 PRESENT   Final   Performed at [facility], [address], [place], [place] [address]   Opiates 12/31/2024 NEGATIVE  NEGATIVE Final   Cocaine  12/31/2024 POSITIVE (A)  NEGATIVE Final   Benzodiazepines 12/31/2024 NEGATIVE  NEGATIVE Final   Amphetamines 12/31/2024 NEGATIVE  NEGATIVE Final   Tetrahydrocannabinol 12/31/2024 NEGATIVE  NEGATIVE Final   Barbiturates 12/31/2024 NEGATIVE  NEGATIVE Final   Methadone Scn, Ur 12/31/2024 NEGATIVE  NEGATIVE Final   Fentanyl 12/31/2024 NEGATIVE  NEGATIVE Final   Comment: (NOTE) Drug screen is for Medical Purposes only. Positive results are preliminary only. If confirmation is needed, notify lab within 5 days.  Drug Class                 Cutoff (ng/mL) Amphetamine and metabolites 1000 Barbiturate and metabolites 200 Benzodiazepine              200 Opiates and metabolites     300 Cocaine  and metabolites     300 THC                         50 Fentanyl                    5 Methadone                   300  Trazodone  is metabolized in vivo to several metabolites,  including pharmacologically active m-CPP, which is excreted in the  urine.  Immunoassay screens for amphetamines and MDMA have potential  cross-reactivity with these compounds and may provide false positive  result.  Performed at [facility], [address] [place], [place] [address]    Acetaminophen  (Tylenol ), Serum 12/31/2024 <10 (L)  10 - 30 ug/mL Final   Comment: (NOTE) Toxic concentrations can be more effectively related  to post dose interval; >200, >100, and >50 ug/mL serum concentrations correspond to  toxic concentrations at 4, 8, and 12 hours post dose, respectively.  Performed at [facility], [address], [place], [place] [address]    Salicylate Lvl 12/31/2024 <7.0 (L)  7.0 - 30.0 mg/dL Final   Performed at [facility], [name], [place], [place] [address]   Alcohol, Ethyl (B) 12/31/2024 <15  <15 mg/dL Final   Comment: (NOTE) For medical purposes only. Performed at [facility], [address] [place], [place] [address]    Troponin T High Sensitivity 12/31/2024 <15  0 - 19 ng/L Final   Comment: (NOTE) Biotin concentrations > 1000 ng/mL falsely decrease TnT results.  Serial cardiac troponin measurements are suggested.  Refer to the Links section for chest pain algorithms and additional  guidance. Performed at [facility], [address] [place], [place] [address]     Blood Alcohol level:  Lab Results  Component Value Date   [facility] <15 12/31/2024   ETH <10 11/12/2022    Metabolic Disorder Labs: Lab Results  Component Value Date   HGBA1C 5.9 (H) 01/01/2025   MPG 122.63 01/01/2025   MPG 111.15 04/30/2021   Lab Results  Component Value Date   PROLACTIN 8.9 04/07/2018   PROLACTIN 14.3 09/23/2015   Lab Results  Component Value Date   CHOL 158 01/01/2025   TRIG 49 01/01/2025   HDL 80 01/01/2025   CHOLHDL 2.0 01/01/2025   VLDL 10 01/01/2025   LDLCALC 68 01/01/2025   LDLCALC 51 04/30/2021    Therapeutic Lab Levels: No results found for: LITHIUM Lab Results  Component Value Date   VALPROATE 117.9 (H) 05/27/2009   VALPROATE 116.9 (H) 05/26/2009   No results found for: CBMZ  Physical Findings   AIMS    Flowsheet Row Admission (Discharged) from 04[facility]30[facility] Admission (Discharged) from OP Visit from 10/03/2019 in [facility] Admission (Discharged) from OP Visit [facility] Admission (Discharged) from 04/02/2018 [facility] Admission (Discharged) from 05/17/2016 [facility]  AIMS Total Score 0 0 0 0 0   AUDIT    Flowsheet Row Admission (Discharged) from 04/30/2020 in [facility] Admission (Discharged) from OP Visit from 11/05/2018 [facility] Admission (Discharged) from 04/02/2018 [facility] Admission (Discharged) from 05/17/2016 [facility] Admission (Discharged) from 09/21/2015 [facility]  Alcohol Use Disorder Identification Test Final Score (AUDIT) 27 39 2 10 2    PHQ2-9    Flowsheet Row ED from 12/31/2024 in [facility] Most recent reading at 01/01/2025 11:33 AM ED from 12/31/2024 in [facility] Most recent reading at 12/31/2024  9:22 PM Office Visit from 05/31/2018 in [facility] Most recent reading at 05/31/2018  1:15 PM Office Visit from 03/01/2017 in [facility] Most recent reading at 03/01/2017  2:52 PM Office Visit from 02/15/2016 in [facility] Most recent reading at 02/15/2016  9:36 AM  PHQ-2 Total Score 2 2 1  0 0  PHQ-9  Total Score 9 8 -- -- --   Flowsheet Row ED from 12/31/2024 in [facility] Most recent reading at 01/01/2025  1:18 AM ED from 12/31/2024 in [facility] Most recent reading at 12/31/2024  8:52 AM ED from 11/14/2023 in [facility] Emergency Department at [facility] Most recent reading at 11/14/2023  1:11 AM  [name]-SSRS RISK CATEGORY Low Risk Low Risk No Risk     Musculoskeletal  Strength & Muscle Tone:  within normal limits Gait & Station: normal Patient leans: N/A  Psychiatric Specialty Exam  Presentation  General Appearance:  Appropriate for Environment  Eye Contact: Fair  Speech: Clear and Coherent; Normal Rate  Speech Volume: Normal  Handedness: Right   Mood and Affect  Mood: Depressed; Dysphoric; Anxious  Affect: Appropriate; Depressed   Thought Process  Thought Processes: Coherent; Linear  Descriptions of Associations:Intact  Orientation:Full (Time, Place and Person)  Thought Content:WDL  Diagnosis of Schizophrenia or Schizoaffective disorder in past: No data recorded Duration of Psychotic Symptoms: No data recorded  Hallucinations:Hallucinations: None Description of Visual Hallucinations: [name] looking at my food  Ideas of Reference:None  Suicidal Thoughts:Suicidal Thoughts: No SI Passive Intent and/or Plan: Without Intent; Without Plan  Homicidal Thoughts:Homicidal Thoughts: No   Sensorium  Memory: Immediate Good; Recent Good  Judgment: Good  Insight: Good   Executive Functions  Concentration: Good  Attention Span: Good  Recall: Good  Fund of Knowledge: Good  Language: Good   Psychomotor Activity  Psychomotor Activity: Psychomotor Activity: Normal   Assets  Assets: Desire for Improvement; Housing; Physical Health; Social Support; Vocational/Educational   Sleep  Sleep: Sleep: Good  Estimated Sleeping Duration (Last 24 Hours): 16.00-17.75 hours  No data recorded   Physical Exam  Physical Exam Vitals and nursing note reviewed.  Constitutional:      Appearance: Normal appearance.  HENT:     Head: Normocephalic.     Nose: Nose normal.  Cardiovascular:     Rate and Rhythm: Normal rate.  Musculoskeletal:        General: Normal range of motion.     Cervical back: Normal range of motion.  Neurological:     General: No focal deficit present.     Mental Status: She is alert and oriented to person,  place, and time. Mental status is at baseline.  Psychiatric:        Mood and Affect: Mood normal.        Behavior: Behavior normal.        Thought Content: Thought content normal.        Judgment: Judgment normal.    Review of Systems  Constitutional: Negative.   HENT: Negative.    Eyes: Negative.   Respiratory:  Positive for cough and sputum production.   Cardiovascular: Negative.   Gastrointestinal: Negative.   Genitourinary: Negative.   Musculoskeletal:  Positive for back pain.  Neurological: Negative.   Endo/Heme/Allergies: Negative.   Psychiatric/Behavioral:  Positive for depression, hallucinations, substance abuse and suicidal ideas.    -Will continue Seroquel  100 mg nightly to help sleep, stabilize mood and decrease psychotic symptoms. WIll start cough medication and zpack for cough x 1 week.  -Patient has been accepted to [facility] treatment center and is awaiting admission/transportation date (likely Saturday).   - Patient has been engaged and attending group programming.   - Labs reviewed: UDS positive for cocaine  as reported and A1c slightly elevated at 5.9, will encourage patient to follow-up with PCP  to monitor A1c.  Discussed healthy diet and physical activity.  Stephanie GORMAN Ramp, FNP 01/02/2025 3:44 PM

## 2025-01-02 NOTE — ED Notes (Signed)
 Patient is sleeping in bedroom. NAD

## 2025-01-02 NOTE — ED Notes (Addendum)
 Pt presents with significant cough, provider notified. Pt c/o body aches and back pain, prn tylenol  given. Mentally, pt says she feels 'better' compared to sate at admission. Pt says she no longer feels suicidal, she is not as depressed. Pt ate breakfast. Pt provided with Vaseline for c/o chap lips and skin.

## 2025-01-02 NOTE — Progress Notes (Addendum)
 Spiritual Care and Counseling Group Note  01/02/2025 2:45pm  Facilitated by: Librada Donnice Lin   Type of Therapy and Topic:  Hope    Participation Level:  DID NOT ATTEND   Description of Group:  Group focused on topic of hope.  Patients participated in facilitated discussion around topic, connecting with one another around experiences and definitions for hope.  Group members engaged with group word cloud.  Members selected an image of what hope looks like for them today.  Group engaged in discussion around how their definitions of hope are present today in hospital.    Summary of Patient Progress:  Pt invited to group.  DID NOT ATTEND    Therapeutic Modalities: Psycho-social ed, Adlerian, Narrative, MI   Librada Donnice Lin, Chaplain 01/02/2025 4:17 PM

## 2025-01-02 NOTE — Group Note (Signed)
 Group Topic: Identity and Relationships  Group Date: 01/02/2025 Start Time: 1900 End Time: 1930 Facilitators: Carollynn Genre, NT  Department: [facility]  Number of Participants: 5  Group Focus: coping skills Treatment Modality:  Behavior Modification Therapy Interventions utilized were support Purpose: to be good  Name: [name] Date of Birth: 1961/09/01  MR: [id number]    Level of Participation: active Quality of Participation: cooperative Interactions with others: gave feedback Mood/Affect: appropriate Triggers (if applicable): N/a Cognition: coherent/clear Progress: Gaining insight Response: good Plan: patient will be encouraged to attend groups.  Patients Problems:  Patient Active Problem List   Diagnosis Date Noted   Substance induced mood disorder (HCC) 12/31/2024   Cocaine  dependence, continuous use (HCC) 04/30/2020   Schizoaffective disorder (HCC) 11/05/2018   Bacterial vaginosis 03/01/2017   Schizoaffective disorder, bipolar type (HCC) 05/18/2016   Annual physical exam 02/15/2016   Encounter for routine gynecological examination 02/15/2016   Breast cancer screening 02/15/2016   Affective psychosis, bipolar (HCC)    Cannabis use disorder, moderate, dependence (HCC) 09/22/2015   Cocaine  use disorder, severe, dependence (HCC) 09/22/2015   Suicidal ideation    Lower urinary tract infectious disease 09/04/2012   Cocaine  abuse (HCC) 12/07/2011

## 2025-01-02 NOTE — ED Notes (Signed)
 PT received in room bed resting until snacks were called.  Pt affect flat and is disheveled offering poor eye contact responds to verbal stimuli.

## 2025-01-02 NOTE — Group Note (Signed)
 Group Topic: Wellness  Group Date: 01/02/2025 Start Time: 1200 End Time: 1230 Facilitators: Daved Tinnie HERO, RN  Department: [facility]  Number of Participants: 5  Group Focus: nursing group Treatment Modality:  Psychoeducation Interventions utilized were patient education Purpose: increase insight  Name: [name] Date of Birth: Oct 09, 1961  MR: [id number]    Level of Participation: moderate Quality of Participation: cooperative Interactions with others: gave feedback Mood/Affect: appropriate Triggers (if applicable): n/a Cognition: coherent/clear Progress: Gaining insight Response: pt expressed understanding of medications Plan: patient will be encouraged to attend future RN education groups  Patients Problems:  Patient Active Problem List   Diagnosis Date Noted   Substance induced mood disorder (HCC) 12/31/2024   Cocaine  dependence, continuous use (HCC) 04/30/2020   Schizoaffective disorder (HCC) 11/05/2018   Bacterial vaginosis 03/01/2017   Schizoaffective disorder, bipolar type (HCC) 05/18/2016   Annual physical exam 02/15/2016   Encounter for routine gynecological examination 02/15/2016   Breast cancer screening 02/15/2016   Affective psychosis, bipolar (HCC)    Cannabis use disorder, moderate, dependence (HCC) 09/22/2015   Cocaine  use disorder, severe, dependence (HCC) 09/22/2015   Suicidal ideation    Lower urinary tract infectious disease 09/04/2012   Cocaine  abuse (HCC) 12/07/2011

## 2025-01-02 NOTE — Group Note (Unsigned)
 Group Topic: Understanding Self  Group Date: 01/02/2025 Start Time: 1900 End Time: 1930 Facilitators: Carollynn Genre, NT  Department: [facility]  Number of Participants: 6  Group Focus: acceptance Treatment Modality:  Individual Therapy Interventions utilized were tiPurpose: regain self-worth  Name: [name] Date of Birth: Aug 07, 1961  MR: [id number]    Level of Participation: {THERAPIES; PSYCH GROUP PARTICIPATION OZCZO:76008} Quality of Participation: {THERAPIES; PSYCH QUALITY OF PARTICIPATION:23992} Interactions with others: {THERAPIES; PSYCH INTERACTIONS:23993} Mood/Affect: {THERAPIES; PSYCH MOOD/AFFECT:23994} Triggers (if applicable): *** Cognition: {THERAPIES; PSYCH COGNITION:23995} Progress: {THERAPIES; PSYCH PROGRESS:23997} Response: *** Plan: {THERAPIES; PSYCH EOJW:76003}  Patients Problems:  Patient Active Problem List   Diagnosis Date Noted   Substance induced mood disorder (HCC) 12/31/2024   Cocaine  dependence, continuous use (HCC) 04/30/2020   Schizoaffective disorder (HCC) 11/05/2018   Bacterial vaginosis 03/01/2017   Schizoaffective disorder, bipolar type (HCC) 05/18/2016   Annual physical exam 02/15/2016   Encounter for routine gynecological examination 02/15/2016   Breast cancer screening 02/15/2016   Affective psychosis, bipolar (HCC)    Cannabis use disorder, moderate, dependence (HCC) 09/22/2015   Cocaine  use disorder, severe, dependence (HCC) 09/22/2015   Suicidal ideation    Lower urinary tract infectious disease 09/04/2012   Cocaine  abuse (HCC) 12/07/2011

## 2025-01-02 NOTE — Discharge Instructions (Signed)
 [facility] Care Management...  [facility] Care Management...  Patient requested inpatient treatment  Per [place] @ [facility]...  Patient has been approved for [facility]  [address],  [place], [place] [address] Phone: [phone]  RN will arrange transportation  30 day scripts

## 2025-01-02 NOTE — Group Note (Signed)
 Group Topic: Relapse and Recovery  Group Date: 01/02/2025 Start Time: 1300 End Time: 1330 Facilitators: Judi Monico RAMAN, NT  Department: [facility]  Number of Participants: 1  Group Focus: check in Treatment Modality:  Psychoeducation Interventions utilized were patient education Purpose: enhance coping skills, express feelings, and increase insight  Name: [name] Date of Birth: 11-15-1961  MR: [id number]    Level of Participation: Pt did not attend group Patients Problems:  Patient Active Problem List   Diagnosis Date Noted   Substance induced mood disorder (HCC) 12/31/2024   Cocaine  dependence, continuous use (HCC) 04/30/2020   Schizoaffective disorder (HCC) 11/05/2018   Bacterial vaginosis 03/01/2017   Schizoaffective disorder, bipolar type (HCC) 05/18/2016   Annual physical exam 02/15/2016   Encounter for routine gynecological examination 02/15/2016   Breast cancer screening 02/15/2016   Affective psychosis, bipolar (HCC)    Cannabis use disorder, moderate, dependence (HCC) 09/22/2015   Cocaine  use disorder, severe, dependence (HCC) 09/22/2015   Suicidal ideation    Lower urinary tract infectious disease 09/04/2012   Cocaine  abuse (HCC) 12/07/2011

## 2025-01-03 NOTE — Group Note (Unsigned)
 Group Topic: Understanding Self  Group Date: 01/03/2025 Start Time: 1000 End Time: 1100 Facilitators: Alyse Leilani LABOR, NT  Department: [facility]  Number of Participants: 9  Group Focus: acceptance, activities of daily living skills, and affirmation Treatment Modality:  Psychoeducation and Skills Training Interventions utilized were clarification, confrontation, and mental fitness Purpose: enhance coping skills, improve communication skills, regain self-worth, and reinforce self-care   Name: [name] Date of Birth: [birth year]  MR: [id number]    Level of Participation: {THERAPIES; PSYCH GROUP PARTICIPATION OZCZO:76008} Quality of Participation: {THERAPIES; PSYCH QUALITY OF PARTICIPATION:23992} Interactions with others: {THERAPIES; PSYCH INTERACTIONS:23993} Mood/Affect: {THERAPIES; PSYCH MOOD/AFFECT:23994} Triggers (if applicable): *** Cognition: {THERAPIES; PSYCH COGNITION:23995} Progress: {THERAPIES; PSYCH PROGRESS:23997} Response: *** Plan: {THERAPIES; PSYCH EOJW:76003}  Patients Problems:  Patient Active Problem List   Diagnosis Date Noted   Substance induced mood disorder (HCC) 12/31/2024   Cocaine  dependence, continuous use (HCC) 04/30/2020   Schizoaffective disorder (HCC) 11/05/2018   Bacterial vaginosis 03/01/2017   Schizoaffective disorder, bipolar type (HCC) 05/18/2016   Annual physical exam 02/15/2016   Encounter for routine gynecological examination 02/15/2016   Breast cancer screening 02/15/2016   Affective psychosis, bipolar (HCC)    Cannabis use disorder, moderate, dependence (HCC) 09/22/2015   Cocaine  use disorder, severe, dependence (HCC) 09/22/2015   Suicidal ideation    Lower urinary tract infectious disease 09/04/2012   Cocaine  abuse (HCC) 12/07/2011

## 2025-01-03 NOTE — ED Notes (Signed)
 Pt sleeping in no acute distress. RR even and unlabored. Environment secured. Will continue to monitor for safety.

## 2025-01-03 NOTE — Group Note (Signed)
 Group Topic: Change and Accountability  Group Date: 01/03/2025 Start Time: 1900 End Time: 1930 Facilitators: Carollynn Genre, NT  Department: [facility]  Number of Participants: 5  Group Focus: acceptance Treatment Modality:  Family Therapy Interventions utilized were support Purpose: explore maladaptive thinking  Name: [name] Date of Birth: 12/03/1961  MR: [id number]    Level of Participation: active Quality of Participation: attentive Interactions with others: gave feedback Mood/Affect:good Triggers (if applicable): N a Cognition: concrete Progress: Gaining insight Response: good Plan: follow-up needed  Patients Problems:  Patient Active Problem List   Diagnosis Date Noted   Substance induced mood disorder (HCC) 12/31/2024   Cocaine  dependence, continuous use (HCC) 04/30/2020   Schizoaffective disorder (HCC) 11/05/2018   Bacterial vaginosis 03/01/2017   Schizoaffective disorder, bipolar type (HCC) 05/18/2016   Annual physical exam 02/15/2016   Encounter for routine gynecological examination 02/15/2016   Breast cancer screening 02/15/2016   Affective psychosis, bipolar (HCC)    Cannabis use disorder, moderate, dependence (HCC) 09/22/2015   Cocaine  use disorder, severe, dependence (HCC) 09/22/2015   Suicidal ideation    Lower urinary tract infectious disease 09/04/2012   Cocaine  abuse (HCC) 12/07/2011

## 2025-01-03 NOTE — ED Notes (Signed)
 Patient A&Ox4. Denies intent to harm self/others when asked. Denies A/VH. Patient denies any physical complaints when asked. No acute distress noted. Support and encouragement provided. Routine safety checks conducted according to facility protocol. Encouraged patient to notify staff if thoughts of harm toward self or others arise. Patient verbalize understanding and agreement. Will continue to monitor for safety.

## 2025-01-03 NOTE — ED Notes (Signed)
 Patient denies pain and is resting comfortably.

## 2025-01-03 NOTE — ED Notes (Signed)
 Pt sleeping long intervals in no apparent distress. Safety maintained.

## 2025-01-03 NOTE — Group Note (Unsigned)
 Group Topic: Understanding Self  Group Date: 01/03/2025 Start Time: 1000 End Time: 1100 Facilitators: Alyse Leilani LABOR, NT  Department: [facility]  Number of Participants: 8  Group Focus: chemical dependency issues, clarity of thought, and co-dependency Treatment Modality:  Psychoeducation Interventions utilized were clarification, confrontation, and group exercise Purpose: enhance coping skills and reinforce self-care   Name: [name] Date of Birth: Jan 08, 1961  MR: [id number]    Level of Participation: {THERAPIES; PSYCH GROUP PARTICIPATION OZCZO:76008} Quality of Participation: {THERAPIES; PSYCH QUALITY OF PARTICIPATION:23992} Interactions with others: {THERAPIES; PSYCH INTERACTIONS:23993} Mood/Affect: {THERAPIES; PSYCH MOOD/AFFECT:23994} Triggers (if applicable): *** Cognition: {THERAPIES; PSYCH COGNITION:23995} Progress: {THERAPIES; PSYCH PROGRESS:23997} Response: *** Plan: {THERAPIES; PSYCH EOJW:76003}  Patients Problems:  Patient Active Problem List   Diagnosis Date Noted   Substance induced mood disorder (HCC) 12/31/2024   Cocaine  dependence, continuous use (HCC) 04/30/2020   Schizoaffective disorder (HCC) 11/05/2018   Bacterial vaginosis 03/01/2017   Schizoaffective disorder, bipolar type (HCC) 05/18/2016   Annual physical exam 02/15/2016   Encounter for routine gynecological examination 02/15/2016   Breast cancer screening 02/15/2016   Affective psychosis, bipolar (HCC)    Cannabis use disorder, moderate, dependence (HCC) 09/22/2015   Cocaine  use disorder, severe, dependence (HCC) 09/22/2015   Suicidal ideation    Lower urinary tract infectious disease 09/04/2012   Cocaine  abuse (HCC) 12/07/2011

## 2025-01-03 NOTE — Group Note (Signed)
 Group Topic: Communication  Group Date: 01/03/2025 Start Time: 1150 End Time: 1220 Facilitators: Herold Lajuana NOVAK, RN  Department: [facility]  Number of Participants: 8  Group Focus: personal responsibility Treatment Modality:  Leisure Development Interventions utilized were confrontation Purpose: increase insight  Name: [name] Date of Birth: 09-16-1961  MR: [id number]      Patients Problems:  Level of Participation: active Quality of Participation: attentive Interactions with others: gave feedback Mood/Affect: appropriate Triggers (if applicable): none identified Cognition: coherent/clear Progress: Gaining insight Response: Pt verbalized understanding and agreement of rules of facility Plan: patient will be encouraged to remain tx compliant and seek staff assistance with any needs or concerns

## 2025-01-03 NOTE — ED Notes (Signed)
 Patient A&O x 4, ambulatory. Patient discharged in no acute distress. Patient denied SI/HI, A/VH upon discharge. Patient verbalized understanding of all discharge instructions reviewed on AVS via staff, to include follow up appointments, RX's and safety. Suicide safety plan completed and reviewed with clinical research associate. A copy given to pt. Patient reported mood 10/10.  Pt belongings returned to patient from locker #5 intact. Patient escorted to lobby via staff for transport to destination. Safety maintained.
